# Patient Record
Sex: Female | Born: 1980 | Race: White | Hispanic: No | Marital: Single | Smoking: Never smoker
Health system: Southern US, Community
[De-identification: ages and names within clinical notes are randomized; demographics above are authoritative.]

## PROBLEM LIST (undated history)

## (undated) DIAGNOSIS — J45909 Unspecified asthma, uncomplicated: Secondary | ICD-10-CM

## (undated) DIAGNOSIS — M7540 Impingement syndrome of unspecified shoulder: Secondary | ICD-10-CM

## (undated) DIAGNOSIS — I10 Essential (primary) hypertension: Secondary | ICD-10-CM

## (undated) DIAGNOSIS — G44309 Post-traumatic headache, unspecified, not intractable: Secondary | ICD-10-CM

## (undated) DIAGNOSIS — Z87442 Personal history of urinary calculi: Secondary | ICD-10-CM

## (undated) DIAGNOSIS — G839 Paralytic syndrome, unspecified: Secondary | ICD-10-CM

## (undated) DIAGNOSIS — Z87820 Personal history of traumatic brain injury: Secondary | ICD-10-CM

## (undated) HISTORY — PX: FRACTURE SURGERY: SHX138

## (undated) HISTORY — DX: Personal history of traumatic brain injury: Z87.820

## (undated) HISTORY — PX: KIDNEY STONE SURGERY: SHX686

## (undated) HISTORY — DX: Impingement syndrome of unspecified shoulder: M75.40

## (undated) HISTORY — DX: Post-traumatic headache, unspecified, not intractable: G44.309

## (undated) HISTORY — PX: HUMERUS FRACTURE SURGERY: SHX670

## (undated) HISTORY — PX: FEMUR FRACTURE SURGERY: SHX633

## (undated) HISTORY — PX: PLANTAR FASCIA RELEASE: SHX2239

## (undated) HISTORY — DX: Unspecified asthma, uncomplicated: J45.909

## (undated) HISTORY — DX: Paralytic syndrome, unspecified: G83.9

## (undated) HISTORY — PX: SHOULDER SURGERY: SHX246

---
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
## 1998-12-11 HISTORY — PX: SUBACROMIAL DECOMPRESSION: SHX5174

## 1999-12-12 HISTORY — PX: TONSILLECTOMY: SUR1361

## 2007-12-12 DIAGNOSIS — M25819 Other specified joint disorders, unspecified shoulder: Secondary | ICD-10-CM

## 2007-12-12 DIAGNOSIS — M7540 Impingement syndrome of unspecified shoulder: Secondary | ICD-10-CM

## 2007-12-12 HISTORY — DX: Impingement syndrome of unspecified shoulder: M75.40

## 2007-12-12 HISTORY — DX: Other specified joint disorders, unspecified shoulder: M25.819

## 2008-08-03 ENCOUNTER — Ambulatory Visit: Payer: Self-pay

## 2008-08-26 ENCOUNTER — Ambulatory Visit: Payer: Self-pay | Admitting: General Practice

## 2008-09-22 ENCOUNTER — Encounter: Payer: Self-pay | Admitting: General Practice

## 2008-10-26 ENCOUNTER — Ambulatory Visit: Payer: Self-pay | Admitting: Internal Medicine

## 2009-07-30 ENCOUNTER — Ambulatory Visit: Payer: Self-pay | Admitting: Internal Medicine

## 2009-12-18 LAB — HM PAP SMEAR

## 2010-01-05 ENCOUNTER — Ambulatory Visit: Payer: Self-pay

## 2010-07-19 ENCOUNTER — Ambulatory Visit: Payer: Self-pay

## 2011-07-11 ENCOUNTER — Other Ambulatory Visit: Payer: Self-pay | Admitting: Internal Medicine

## 2011-10-02 ENCOUNTER — Other Ambulatory Visit: Payer: Self-pay | Admitting: Internal Medicine

## 2011-10-19 ENCOUNTER — Encounter: Payer: Self-pay | Admitting: Internal Medicine

## 2011-10-20 ENCOUNTER — Ambulatory Visit: Payer: Self-pay | Admitting: Internal Medicine

## 2011-10-20 ENCOUNTER — Ambulatory Visit (INDEPENDENT_AMBULATORY_CARE_PROVIDER_SITE_OTHER): Payer: 59 | Admitting: Internal Medicine

## 2011-10-20 ENCOUNTER — Encounter: Payer: Self-pay | Admitting: Internal Medicine

## 2011-10-20 VITALS — BP 122/82 | HR 111 | Temp 98.6°F | Wt 168.0 lb

## 2011-10-20 DIAGNOSIS — J40 Bronchitis, not specified as acute or chronic: Secondary | ICD-10-CM

## 2011-10-20 DIAGNOSIS — H6690 Otitis media, unspecified, unspecified ear: Secondary | ICD-10-CM

## 2011-10-20 MED ORDER — SULFAMETHOXAZOLE-TRIMETHOPRIM 800-160 MG PO TABS: 1.0000 | ORAL_TABLET | Freq: Two times a day (BID) | ORAL | Status: DC

## 2011-10-20 NOTE — Patient Instructions (Signed)
Chest xray today. Follow up next week. Call sooner if symptoms not improving or worsening.

## 2011-10-21 ENCOUNTER — Encounter: Payer: Self-pay | Admitting: Internal Medicine

## 2011-10-21 NOTE — Progress Notes (Signed)
Subjective:    Patient ID: Margaret Zuniga, female    DOB: 02-21-1981, 30 y.o.   MRN: [id number]  HPI 30 year old female presents for an acute visit complaining of a two-week history of sinus pressure, nasal drainage, non productive cough, and general malaise. She notes that she was seen at urgent care 2 weeks ago and treated with a ten-day course of Augmentin for suspected sinusitis. She reports some improvement in her symptoms with antibiotics however her symptoms have now recurred. She reports some chills but no fevers. She denies any shortness of breath. She's been using over-the-counter cough and cold medicines including Robitussin. She has minimal relief with this.  Outpatient Encounter Prescriptions as of 10/20/2011  Medication Sig Dispense Refill  . B Complex-C-Folic Acid (MULTIVITAMIN, STRESS FORMULA) tablet Take 1 tablet by mouth daily.        . citalopram (CELEXA) 20 MG tablet Take 20 mg by mouth daily.        Marland Kitchen ibuprofen (ADVIL,MOTRIN) 600 MG tablet Take 600 mg by mouth every 6 (six) hours as needed.        Marland Kitchen imipramine (TOFRANIL) 50 MG tablet Take 50 mg by mouth at bedtime.        Marland Kitchen ketorolac (TORADOL) 10 MG tablet Take 10 mg by mouth every 6 (six) hours as needed.        Clelia Schaumann Estrad 91-Day (SEASONIQUE PO) Take by mouth daily.        . Magnesium 250 MG TABS Take by mouth.        . nabumetone (RELAFEN) 500 MG tablet Take 500 mg by mouth 2 (two) times daily as needed.       . promethazine (PHENERGAN) 25 MG tablet Take 25 mg by mouth every 6 (six) hours as needed.        . psyllium (METAMUCIL) 58.6 % powder Take 1 packet by mouth 3 (three) times daily.        . QUEtiapine (SEROQUEL) 25 MG tablet Take 25 mg by mouth at bedtime.        Marland Kitchen tiZANidine (ZANAFLEX) 2 MG tablet Take 2 mg by mouth every 6 (six) hours as needed.        . topiramate (TOPAMAX) 100 MG tablet TAKE 1 TABLET BY MOUTH EVERY DAY AT BEDTIME  30 tablet  4  . atenolol (TENORMIN) 50 MG tablet Take 50 mg by mouth  daily.        . calcium carbonate (OS-CAL) 600 MG TABS Take 600 mg by mouth 2 (two) times daily with a meal.        . desogestrel-ethinyl estradiol (KARIVA,AZURETTE,MIRCETTE) 0.15-0.02/0.01 MG (21/5) tablet Take 1 tablet by mouth daily.          Review of Systems  Constitutional: Positive for chills and fatigue. Negative for fever and unexpected weight change.  HENT: Positive for ear pain, congestion, rhinorrhea, postnasal drip and sinus pressure. Negative for hearing loss, nosebleeds, sore throat, facial swelling, sneezing, mouth sores, trouble swallowing, neck pain, neck stiffness, voice change, tinnitus and ear discharge.   Eyes: Negative for pain, discharge, redness and visual disturbance.  Respiratory: Positive for cough. Negative for chest tightness, shortness of breath, wheezing and stridor.   Cardiovascular: Negative for chest pain, palpitations and leg swelling.  Musculoskeletal: Negative for myalgias and arthralgias.  Skin: Negative for color change and rash.  Neurological: Negative for dizziness, weakness, light-headedness and headaches.  Hematological: Negative for adenopathy.   BP 122/82  Pulse 111  Temp(Src) 98.6 F (  37 C) (Oral)  Wt 168 lb (76.204 kg)  SpO2 95%     Objective:   Physical Exam  Constitutional: She is oriented to person, place, and time. She appears well-developed and well-nourished. No distress.  HENT:  Head: Normocephalic and atraumatic.  Right Ear: External ear normal. A middle ear effusion is present.  Left Ear: External ear normal. Tympanic membrane is erythematous and bulging. A middle ear effusion is present.  Nose: Nose normal.  Mouth/Throat: Oropharynx is clear and moist. No oropharyngeal exudate.  Eyes: Conjunctivae are normal. Pupils are equal, round, and reactive to light. Right eye exhibits no discharge. Left eye exhibits no discharge. No scleral icterus.  Neck: Normal range of motion. Neck supple. No tracheal deviation present. No  thyromegaly present.  Cardiovascular: Normal rate, regular rhythm, normal heart sounds and intact distal pulses.  Exam reveals no gallop and no friction rub.   No murmur heard. Pulmonary/Chest: Effort normal. No respiratory distress. She has no wheezes. She has rhonchi in the right middle field. She has no rales. She exhibits no tenderness.  Musculoskeletal: Normal range of motion. She exhibits no edema and no tenderness.  Lymphadenopathy:    She has no cervical adenopathy.  Neurological: She is alert and oriented to person, place, and time. No cranial nerve deficit. She exhibits normal muscle tone. Coordination normal.  Skin: Skin is warm and dry. No rash noted. She is not diaphoretic. No erythema. No pallor.  Psychiatric: She has a normal mood and affect. Her behavior is normal. Judgment and thought content normal.          Assessment & Plan:  1. Bronchitis and right OM - Will treat with bactrim for better staph coverage.  Continue OTC Rotitussin DM and ibuprofen. Treatment options limited because of antibiotic interactions with pt meds. Pt will have CXR. Follow up next week or sooner if symptoms worsening.

## 2011-10-27 ENCOUNTER — Encounter: Payer: Self-pay | Admitting: Internal Medicine

## 2011-10-27 ENCOUNTER — Ambulatory Visit (INDEPENDENT_AMBULATORY_CARE_PROVIDER_SITE_OTHER): Payer: 59 | Admitting: Internal Medicine

## 2011-10-27 DIAGNOSIS — H6690 Otitis media, unspecified, unspecified ear: Secondary | ICD-10-CM

## 2011-10-27 DIAGNOSIS — J40 Bronchitis, not specified as acute or chronic: Secondary | ICD-10-CM

## 2011-10-27 DIAGNOSIS — G44309 Post-traumatic headache, unspecified, not intractable: Secondary | ICD-10-CM

## 2011-10-27 DIAGNOSIS — R030 Elevated blood-pressure reading, without diagnosis of hypertension: Secondary | ICD-10-CM

## 2011-10-27 MED ORDER — SULFAMETHOXAZOLE-TRIMETHOPRIM 800-160 MG PO TABS: 1.0000 | ORAL_TABLET | Freq: Two times a day (BID) | ORAL | Status: AC

## 2011-10-27 MED ORDER — GUAIFENESIN-CODEINE 100-10 MG/5ML PO SYRP: 5.0000 mL | ORAL_SOLUTION | Freq: Two times a day (BID) | ORAL | Status: DC | PRN

## 2011-10-27 MED ORDER — PREDNISONE (PAK) 10 MG PO TABS: ORAL_TABLET | ORAL | Status: AC

## 2011-10-27 NOTE — Patient Instructions (Addendum)
Use the sudafed pe 10 mg every 6 hours for ear tightness and pain,    Start the prednisone tonight and continue septra /bactrim for one more week.   Use the robitussin ac during the day for cough

## 2011-10-29 ENCOUNTER — Encounter: Payer: Self-pay | Admitting: Internal Medicine

## 2011-10-29 DIAGNOSIS — G839 Paralytic syndrome, unspecified: Secondary | ICD-10-CM | POA: Insufficient documentation

## 2011-10-29 DIAGNOSIS — G44309 Post-traumatic headache, unspecified, not intractable: Secondary | ICD-10-CM | POA: Insufficient documentation

## 2011-10-29 DIAGNOSIS — M7540 Impingement syndrome of unspecified shoulder: Secondary | ICD-10-CM | POA: Insufficient documentation

## 2011-10-29 DIAGNOSIS — I10 Essential (primary) hypertension: Secondary | ICD-10-CM | POA: Insufficient documentation

## 2011-10-29 NOTE — Assessment & Plan Note (Signed)
We reviewed her medication list and confirmed that she is not using toradol, relafen and ibuprofen concurrently.  She currently has her headaches under control but occasional needs a narcotic and is not abusing the prescription I gave her several months ago.
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
## 2011-10-29 NOTE — Assessment & Plan Note (Signed)
She has had prior diastolic elevations to 86.  Will have her suspend use of daily NSAIDs to see if this is the cause.

## 2011-11-01 ENCOUNTER — Encounter: Payer: Self-pay | Admitting: Internal Medicine

## 2011-11-09 ENCOUNTER — Other Ambulatory Visit: Payer: Self-pay | Admitting: Internal Medicine

## 2011-11-09 MED ORDER — IMIPRAMINE HCL 50 MG PO TABS: 50.0000 mg | ORAL_TABLET | Freq: Every day | ORAL | Status: DC

## 2011-11-09 MED ORDER — CITALOPRAM HYDROBROMIDE 20 MG PO TABS: 20.0000 mg | ORAL_TABLET | Freq: Every day | ORAL | Status: DC

## 2011-12-01 ENCOUNTER — Ambulatory Visit (INDEPENDENT_AMBULATORY_CARE_PROVIDER_SITE_OTHER): Payer: 59 | Admitting: Internal Medicine

## 2011-12-01 VITALS — BP 132/82 | HR 94 | Temp 98.5°F | Wt 165.0 lb

## 2011-12-01 DIAGNOSIS — H6690 Otitis media, unspecified, unspecified ear: Secondary | ICD-10-CM

## 2011-12-01 DIAGNOSIS — H6691 Otitis media, unspecified, right ear: Secondary | ICD-10-CM

## 2011-12-01 MED ORDER — ANTIPYRINE-BENZOCAINE 5.4-1.4 % OT SOLN: 3.0000 [drp] | OTIC | Status: AC | PRN

## 2011-12-01 MED ORDER — DOXYCYCLINE HYCLATE 100 MG PO CAPS: 100.0000 mg | ORAL_CAPSULE | Freq: Two times a day (BID) | ORAL | Status: AC

## 2011-12-02 ENCOUNTER — Encounter: Payer: Self-pay | Admitting: Internal Medicine

## 2011-12-02 NOTE — Progress Notes (Signed)
Subjective:    Patient ID: Margaret Zuniga, female    DOB: 09-20-1981, 30 y.o.   MRN: [id number]  HPI 30YO female presents for acute visit complaining of right ear pain x 1 week. Has h/o recurrent OM.  Denies fever or chills. Has some mild nasal congestion. No cough. No sore throat. Has been using OTC ibuprofen with no improvement.  Outpatient Encounter Prescriptions as of 12/01/2011  Medication Sig Dispense Refill  . Aspirin-Acetaminophen-Caffeine (EXCEDRIN PO) Take by mouth.        . B Complex-C-Folic Acid (MULTIVITAMIN, STRESS FORMULA) tablet Take 1 tablet by mouth daily.        . citalopram (CELEXA) 20 MG tablet Take 1 tablet (20 mg total) by mouth daily.  30 tablet  3  . co-enzyme Q-10 30 MG capsule Take 30 mg by mouth 3 (three) times daily.        Marland Kitchen guaiFENesin-codeine (ROBITUSSIN AC) 100-10 MG/5ML syrup Take 5 mLs by mouth 2 (two) times daily as needed for cough.  240 mL  0  . ibuprofen (ADVIL,MOTRIN) 600 MG tablet Take 600 mg by mouth every 6 (six) hours as needed.        Marland Kitchen imipramine (TOFRANIL) 50 MG tablet Take 1 tablet (50 mg total) by mouth at bedtime.  30 tablet  3  . ketorolac (TORADOL) 10 MG tablet Take 10 mg by mouth every 6 (six) hours as needed.        Clelia Schaumann Estrad 91-Day (SEASONIQUE PO) Take by mouth daily.        . Magnesium 250 MG TABS Take by mouth.        . nabumetone (RELAFEN) 500 MG tablet Take 500 mg by mouth 2 (two) times daily as needed.       . promethazine (PHENERGAN) 25 MG tablet Take 25 mg by mouth every 6 (six) hours as needed.        . psyllium (METAMUCIL) 58.6 % powder Take 1 packet by mouth 3 (three) times daily.        . QUEtiapine (SEROQUEL) 25 MG tablet Take 25 mg by mouth at bedtime.        Marland Kitchen tiZANidine (ZANAFLEX) 2 MG tablet Take 2 mg by mouth every 6 (six) hours as needed.        . topiramate (TOPAMAX) 100 MG tablet TAKE 1 TABLET BY MOUTH EVERY DAY AT BEDTIME  30 tablet  4  . antipyrine-benzocaine (AURALGAN) otic solution Place 3 drops into the  right ear every 2 (two) hours as needed for pain.  10 mL  0  . doxycycline (VIBRAMYCIN) 100 MG capsule Take 1 capsule (100 mg total) by mouth 2 (two) times daily.  20 capsule  0    Review of Systems  Constitutional: Negative for fever, chills and unexpected weight change.  HENT: Positive for ear pain and congestion. Negative for hearing loss, nosebleeds, sore throat, facial swelling, rhinorrhea, sneezing, mouth sores, trouble swallowing, neck pain, neck stiffness, voice change, postnasal drip, sinus pressure, tinnitus and ear discharge.   Eyes: Negative for pain, discharge, redness and visual disturbance.  Respiratory: Negative for cough, chest tightness, shortness of breath, wheezing and stridor.   Cardiovascular: Negative for chest pain, palpitations and leg swelling.  Musculoskeletal: Negative for myalgias and arthralgias.  Skin: Negative for color change and rash.  Neurological: Negative for dizziness, weakness, light-headedness and headaches.  Hematological: Negative for adenopathy.   BP 132/82  Pulse 94  Temp(Src) 98.5 F (36.9 C) (Oral)  Wt  165 lb (74.844 kg)  SpO2 98%     Objective:   Physical Exam  Constitutional: She is oriented to person, place, and time. She appears well-developed and well-nourished. No distress.  HENT:  Head: Normocephalic and atraumatic.  Right Ear: External ear normal. Tympanic membrane is erythematous and bulging. A middle ear effusion is present.  Left Ear: External ear normal. Tympanic membrane is not erythematous and not bulging. A middle ear effusion is present.  Nose: Nose normal.  Mouth/Throat: Oropharynx is clear and moist. No oropharyngeal exudate.  Eyes: Conjunctivae are normal. Pupils are equal, round, and reactive to light. Right eye exhibits no discharge. Left eye exhibits no discharge. No scleral icterus.  Neck: Normal range of motion. Neck supple. No tracheal deviation present. No thyromegaly present.  Cardiovascular: Normal rate,  regular rhythm, normal heart sounds and intact distal pulses.  Exam reveals no gallop and no friction rub.   No murmur heard. Pulmonary/Chest: Effort normal and breath sounds normal. No respiratory distress. She has no wheezes. She has no rales. She exhibits no tenderness.  Musculoskeletal: Normal range of motion. She exhibits no edema and no tenderness.  Lymphadenopathy:    She has no cervical adenopathy.  Neurological: She is alert and oriented to person, place, and time. No cranial nerve deficit. She exhibits normal muscle tone. Coordination normal.  Skin: Skin is warm and dry. No rash noted. She is not diaphoretic. No erythema. No pallor.  Psychiatric: She has a normal mood and affect. Her behavior is normal. Judgment and thought content normal.          Assessment & Plan:  1. Otitis media - Will treat with doxycycline 100mg  po bid x 10 days. Will use auralgan for pain.  Follow up if no improvement over next 48hr.

## 2011-12-07 ENCOUNTER — Encounter: Payer: Self-pay | Admitting: Internal Medicine

## 2012-02-13 ENCOUNTER — Ambulatory Visit (INDEPENDENT_AMBULATORY_CARE_PROVIDER_SITE_OTHER): Payer: 59 | Admitting: Internal Medicine

## 2012-02-13 ENCOUNTER — Encounter: Payer: Self-pay | Admitting: Internal Medicine

## 2012-02-13 VITALS — BP 132/94 | HR 125 | Temp 98.4°F | Resp 16 | Ht 63.0 in | Wt 164.2 lb

## 2012-02-13 DIAGNOSIS — G44321 Chronic post-traumatic headache, intractable: Secondary | ICD-10-CM

## 2012-02-13 DIAGNOSIS — R000 Tachycardia, unspecified: Secondary | ICD-10-CM

## 2012-02-13 DIAGNOSIS — R51 Headache: Secondary | ICD-10-CM

## 2012-02-13 DIAGNOSIS — G44329 Chronic post-traumatic headache, not intractable: Secondary | ICD-10-CM

## 2012-02-13 MED ORDER — METOPROLOL SUCCINATE ER 25 MG PO TB24: 25.0000 mg | ORAL_TABLET | Freq: Every day | ORAL | Status: DC
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
## 2012-02-13 NOTE — Patient Instructions (Signed)
Rather than increasing the imipramine,  I would like to add Toprol 25 mg daily at bedtime for your headache, which will also treat your rapid heart rate.  You may increase the dose to 2 tablets ( a total of 50 mg daily ) in one week

## 2012-02-14 ENCOUNTER — Encounter: Payer: Self-pay | Admitting: Internal Medicine

## 2012-02-14 ENCOUNTER — Encounter: Payer: Self-pay | Admitting: Neurology

## 2012-02-14 DIAGNOSIS — G44321 Chronic post-traumatic headache, intractable: Secondary | ICD-10-CM | POA: Insufficient documentation

## 2012-02-14 NOTE — Assessment & Plan Note (Signed)
She is currently taking imipramine and Topamax. She is tachycardic and has been on several prior occasions therefore we'll start low-dose Toprol and see her back in one month. I am recommending that she stop her birth control which is known to do this because of the way she feels when she has her menstrual cycle. Her headaches are aggravated by her menstrual cycle. I am referring her to [facility] for neurologic evaluation as she has not had any recent contact with neurologist there.

## 2012-03-04 ENCOUNTER — Other Ambulatory Visit: Payer: Self-pay | Admitting: Internal Medicine

## 2012-03-04 MED ORDER — TOPIRAMATE 100 MG PO TABS: 100.0000 mg | ORAL_TABLET | Freq: Every day | ORAL | Status: DC

## 2012-03-04 NOTE — Telephone Encounter (Signed)
Patient needs her topamax called into [facility] in [place].  She states they requested last week and she went by on Saturday to pick up but it wasn't there.

## 2012-03-11 ENCOUNTER — Other Ambulatory Visit: Payer: Self-pay | Admitting: Internal Medicine

## 2012-03-11 DIAGNOSIS — R51 Headache: Secondary | ICD-10-CM

## 2012-03-11 MED ORDER — IMIPRAMINE HCL 50 MG PO TABS: 50.0000 mg | ORAL_TABLET | Freq: Every day | ORAL | Status: DC

## 2012-03-11 MED ORDER — TOPIRAMATE 100 MG PO TABS: 100.0000 mg | ORAL_TABLET | Freq: Every day | ORAL | Status: DC

## 2012-03-11 MED ORDER — CITALOPRAM HYDROBROMIDE 20 MG PO TABS: 20.0000 mg | ORAL_TABLET | Freq: Every day | ORAL | Status: DC

## 2012-03-11 MED ORDER — METOPROLOL SUCCINATE ER 25 MG PO TB24: 50.0000 mg | ORAL_TABLET | Freq: Every day | ORAL | Status: DC

## 2012-04-02 ENCOUNTER — Encounter: Payer: Self-pay | Admitting: Neurology

## 2012-04-02 ENCOUNTER — Ambulatory Visit (INDEPENDENT_AMBULATORY_CARE_PROVIDER_SITE_OTHER): Payer: 59 | Admitting: Neurology

## 2012-04-02 VITALS — BP 110/80 | HR 84 | Wt 166.0 lb

## 2012-04-02 DIAGNOSIS — R51 Headache: Secondary | ICD-10-CM

## 2012-04-02 DIAGNOSIS — Z87820 Personal history of traumatic brain injury: Secondary | ICD-10-CM

## 2012-04-02 NOTE — Patient Instructions (Signed)
Your MRI is scheduled for Thursday, April 25th at 3:00pm.  Please arrive to [facility], first floor admitting by 2:45pm. [phone].  Your lumbar puncture is scheduled for Monday, April 29th at 10:00am.  Please arrive to Short Stay at [facility] by 8:30am.  Do not eat or drink anything after midnight.  You will need a driver afterward.  We will see you back on June 28th at 10:00am.
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
## 2012-04-04 ENCOUNTER — Ambulatory Visit (HOSPITAL_COMMUNITY)
Admission: RE | Admit: 2012-04-04 | Discharge: 2012-04-04 | Disposition: A | Discharge status: Home / Self Care | Payer: 59 | Source: Ambulatory Visit | Attending: Neurology | Admitting: Neurology

## 2012-04-04 ENCOUNTER — Encounter (HOSPITAL_COMMUNITY): Payer: Self-pay | Admitting: Pharmacy Technician

## 2012-04-04 DIAGNOSIS — J323 Chronic sphenoidal sinusitis: Secondary | ICD-10-CM | POA: Insufficient documentation

## 2012-04-04 DIAGNOSIS — J320 Chronic maxillary sinusitis: Secondary | ICD-10-CM | POA: Insufficient documentation

## 2012-04-04 DIAGNOSIS — Z87820 Personal history of traumatic brain injury: Secondary | ICD-10-CM | POA: Insufficient documentation

## 2012-04-04 DIAGNOSIS — R51 Headache: Secondary | ICD-10-CM | POA: Insufficient documentation

## 2012-04-05 ENCOUNTER — Other Ambulatory Visit: Payer: Self-pay | Admitting: Radiology

## 2012-04-05 ENCOUNTER — Other Ambulatory Visit: Payer: Self-pay | Admitting: Neurology

## 2012-04-05 DIAGNOSIS — R51 Headache: Secondary | ICD-10-CM

## 2012-04-08 ENCOUNTER — Ambulatory Visit (HOSPITAL_COMMUNITY)
Admission: RE | Admit: 2012-04-08 | Discharge: 2012-04-08 | Disposition: A | Discharge status: Home / Self Care | Payer: Medicare Other | Source: Ambulatory Visit | Attending: Neurology | Admitting: Neurology

## 2012-04-08 DIAGNOSIS — Z87820 Personal history of traumatic brain injury: Secondary | ICD-10-CM

## 2012-04-08 DIAGNOSIS — R51 Headache: Secondary | ICD-10-CM

## 2012-04-08 LAB — CSF CELL COUNT WITH DIFFERENTIAL
Eosinophils, CSF: 0 % (ref 0–1)
Eosinophils, CSF: 0 % (ref 0–1)
Other Cells, CSF: 0
Other Cells, CSF: 0
RBC Count, CSF: 1 /mm3 — ABNORMAL HIGH
RBC Count, CSF: 23 /mm3 — ABNORMAL HIGH
Tube #: 1
Tube #: 3
WBC, CSF: 0 /mm3 (ref 0–5)
WBC, CSF: 1 /mm3 (ref 0–5)

## 2012-04-08 LAB — PROTEIN AND GLUCOSE, CSF
Glucose, CSF: 66 mg/dL (ref 43–76)
Total  Protein, CSF: 47 mg/dL — ABNORMAL HIGH (ref 15–45)

## 2012-04-08 MED ORDER — ACETAMINOPHEN 325 MG PO TABS: 650.0000 mg | ORAL_TABLET | ORAL | Status: DC | PRN

## 2012-04-08 NOTE — Procedures (Signed)
Successful fluoro-guided LP at L3-4.  Opening pressure 11 cm H20.  12 mL clear CSF withdrawn and sent for laboratory evaluation.  No immediate complications.

## 2012-04-08 NOTE — Discharge Instructions (Signed)
Lumbar Puncture A lumbar puncture (LP) is a procedure in which a small amount of the fluid that surrounds the brain and spinal cord, is removed and examined. The fluid is called the cerebrospinal fluid, or CSF. This test is also called a spinal tap. This is a very safe and commonly used test. Your caregiver will explain the need for this in your child. This test can be lifesaving when infections are present. The most common reason for doing a lumbar puncture in infants and children is to look for an infection of the meninges. The meninges cover and help protect the brain and spinal cord. Meningitis is an infection that inflames the meninges. Lumbar punctures are also done to remove fluid and relieve pressure with certain types of headaches. Sometimes they are performed to look for bleeding in the central nervous system or to place medicine into the spinal fluid. PROCEDURE The patient is positioned so that the spaces between the vertebrae (bones of the spine) are as wide as possible. This position makes it easier to pass the needle into the spinal canal. Infants and small children lie on their sides curled up with their knees under their chin. Teens or adults may sit with their heads resting on a pillow placed on a table at waist level. The skin covering the lower or lumbar region of the back is cleaned. Sometimes the skin is numbed with medication. A small needle is then inserted until it enters the space that contains the spinal fluid. The needle does not enter the spinal cord because the test is done below the level of the spinal cord. The spinal fluid is collected into tubes. It is then sent to a laboratory where it is examined. Cultures may be taken if an infection is suspected. These studies give valuable information to help diagnose various problems including meningitis, encephalitis, bleeding within the central nervous system (brain and spinal cord areas), multiple sclerosis, AIDS, etc. Some results  are available within 30 minutes. If cultures are done to look for infection, the results are usually not available for a couple days. If your caregiver suspects infection, antibiotic treatment may be started before the results are back.  The pressure of the spinal fluid can also be measured as part of the test. After the sample is collected, the needle is withdrawn and a bandage is placed on the site. RISKS AND COMPLICATIONS One of the risks of this test is bleeding. This most often occurs in people with bleeding disorders. These are disorders in which the blood does not clot normally. Your caregiver always makes sure the benefits outweigh the risks. Paralysis following a LP would be an extremely rare complication. The most common problem following an LP is a spinal headache. This is uncomfortable but not dangerous. The spinal headache can easily be treated. This headache comes with sitting up or standing following a spinal tap. It is due to a change in pressure that is seen after fluid is withdrawn. AFTER THE PROCEDURE  Remain lying down (except for bathroom use) for one hour or as long as your caregiver suggests.   Avoid heavy lifting (over 10 pounds) for at least 12 hours after the procedure.   Drink enough fluids to keep your urine clear or pale yellow.   The above measures will help prevent a spinal headache.   Notify your caregiver immediately if you develop any numbness or tingling in your legs following the procedure or if you are unable to control your bowel or bladder.  Document Released: 11/24/2000 Document Revised: 11/16/2011 Document Reviewed: 02/17/2008 [facility] Patient Information 2012 [place], [place].

## 2012-04-09 ENCOUNTER — Telehealth (HOSPITAL_COMMUNITY): Payer: Self-pay | Admitting: *Deleted

## 2012-04-09 NOTE — Telephone Encounter (Signed)
Post procedure follow up call.  Says doing well, no problems at this time

## 2012-04-11 ENCOUNTER — Telehealth: Payer: Self-pay | Admitting: Neurology

## 2012-04-11 ENCOUNTER — Other Ambulatory Visit: Payer: Self-pay | Admitting: Neurology

## 2012-04-11 MED ORDER — TOPIRAMATE 50 MG PO TABS: ORAL_TABLET | ORAL | Status: DC

## 2012-04-11 MED ORDER — DEXAMETHASONE 2 MG PO TABS: ORAL_TABLET | ORAL | Status: DC

## 2012-04-11 NOTE — Telephone Encounter (Signed)
Called and spoke with Selena Batten. Information given as directed by Dr. Modesto Charon below. Instructed to d/c OTC medications. Aware of new prescriptions and how to take them. F/u appointment rescheduled at the patient's request. Advised to call with questions or concerns prn. The patient agreed with this plan.

## 2012-04-11 NOTE — Telephone Encounter (Signed)
Message copied by Benay Spice on Thu Apr 11, 2012 11:33 AM ------      Message from: Milas Gain      Created: Thu Apr 11, 2012  9:27 AM       Let Margaret Zuniga know that her the pressure on her LP and her MRI brain looked ok.  I would suggest increasing her Topamax to 50 in the a.m. and 100 at night - we will need to call her a new prescription for this.  Also, if she can stop any OTC meds for at least 3 weeks and at the same time I would like to give her a steroid taper which can sometimes help break the headache - Decadron 2mg  tabs take 2 tabs bid for 5 days, and then take 1 tab bid for 5 days then stop.  dispense 30 tabs no refills.

## 2012-04-15 ENCOUNTER — Telehealth: Payer: Self-pay | Admitting: Neurology

## 2012-04-15 NOTE — Telephone Encounter (Signed)
If she absolutely has to, then she can take the percocet and/or naproxen, but let her know that I am worried the percocet in particular may be making the headache worse.  Have her keep track of how many percocet she takes for headache.

## 2012-04-15 NOTE — Telephone Encounter (Signed)
Picked up a call from the patient. She wants to know if she can take her Percocet for a severe HA prn and her Naproxen for chest pain prn? We talked last Thursday and was instructed to stop all OTC medications for 3 weeks while she was taking her Decadron taper and increasing her Topamax. She c/o of a severe HA yesterday but took nothing. She currently has a HA today she rates a 7/10. She wants to be sure she can take something on as as needed basis. I told her I would check with Dr. Modesto Charon and get back with her. **Dr. Modesto Charon, please advise. Thanks.

## 2012-04-15 NOTE — Telephone Encounter (Signed)
Spoke with the patient. Information given as per Dr. Modesto Charon. The patient understands. She will keep track of the number of Percocet she takes for her severe HA.

## 2012-05-01 ENCOUNTER — Encounter: Payer: Self-pay | Admitting: Internal Medicine

## 2012-05-01 ENCOUNTER — Ambulatory Visit (INDEPENDENT_AMBULATORY_CARE_PROVIDER_SITE_OTHER): Payer: Medicare Other | Admitting: Internal Medicine

## 2012-05-01 VITALS — BP 120/82 | HR 107 | Temp 98.1°F | Resp 16 | Wt 165.8 lb

## 2012-05-01 DIAGNOSIS — H6981 Other specified disorders of Eustachian tube, right ear: Secondary | ICD-10-CM

## 2012-05-01 DIAGNOSIS — R51 Headache: Secondary | ICD-10-CM

## 2012-05-01 DIAGNOSIS — H6980 Other specified disorders of Eustachian tube, unspecified ear: Secondary | ICD-10-CM

## 2012-05-01 MED ORDER — CICLESONIDE 50 MCG/ACT NA SUSP: 2.0000 | Freq: Every day | NASAL | Status: DC

## 2012-05-01 MED ORDER — AZITHROMYCIN 500 MG PO TABS: 500.0000 mg | ORAL_TABLET | Freq: Every day | ORAL | Status: AC

## 2012-05-01 MED ORDER — OXYCODONE-ACETAMINOPHEN 5-325 MG PO TABS: 1.0000 | ORAL_TABLET | Freq: Three times a day (TID) | ORAL | Status: DC | PRN

## 2012-05-01 NOTE — Patient Instructions (Addendum)
I would like you to use Omnaris 2 squirts in each nostril  Once daily (4 squirts total  ) to help decompress your ear.  I will call in an rx for azithromycin  To take for one week and a refill on the omnaris to continue using after 2 weeks of samples  Consider takign zyretec or allegra once daily in the evening for allergies.
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
## 2012-05-05 DIAGNOSIS — H6981 Other specified disorders of Eustachian tube, right ear: Secondary | ICD-10-CM | POA: Insufficient documentation

## 2012-05-05 DIAGNOSIS — H6983 Other specified disorders of Eustachian tube, bilateral: Secondary | ICD-10-CM | POA: Insufficient documentation

## 2012-05-05 NOTE — Assessment & Plan Note (Addendum)
Trial of omnairs steroid nasal spray.  There is no immediate indication for abx but since I cannot see the TM will treat empirically with azithromycin She cannot use oral decongestants.

## 2012-05-09 ENCOUNTER — Other Ambulatory Visit: Payer: Self-pay | Admitting: Internal Medicine

## 2012-05-09 MED ORDER — LEVONORGEST-ETH ESTRAD 91-DAY 0.15-0.03 &0.01 MG PO TABS: 1.0000 | ORAL_TABLET | Freq: Every day | ORAL | Status: DC

## 2012-06-07 ENCOUNTER — Ambulatory Visit: Payer: 59 | Admitting: Neurology

## 2012-06-17 ENCOUNTER — Ambulatory Visit (INDEPENDENT_AMBULATORY_CARE_PROVIDER_SITE_OTHER): Payer: 59 | Admitting: Neurology

## 2012-06-17 ENCOUNTER — Encounter: Payer: Self-pay | Admitting: Neurology

## 2012-06-17 VITALS — BP 120/80 | HR 104 | Wt 170.0 lb

## 2012-06-17 DIAGNOSIS — G44329 Chronic post-traumatic headache, not intractable: Secondary | ICD-10-CM

## 2012-06-17 DIAGNOSIS — F32A Depression, unspecified: Secondary | ICD-10-CM

## 2012-06-17 DIAGNOSIS — F3289 Other specified depressive episodes: Secondary | ICD-10-CM

## 2012-06-17 DIAGNOSIS — G44321 Chronic post-traumatic headache, intractable: Secondary | ICD-10-CM

## 2012-06-17 DIAGNOSIS — F329 Major depressive disorder, single episode, unspecified: Secondary | ICD-10-CM

## 2012-06-17 MED ORDER — MELATONIN 3 MG PO TABS: 3.0000 mg | ORAL_TABLET | Freq: Every day | ORAL | Status: DC

## 2012-06-17 MED ORDER — ELETRIPTAN HYDROBROMIDE 40 MG PO TABS: 40.0000 mg | ORAL_TABLET | ORAL | Status: DC | PRN

## 2012-06-17 MED ORDER — LAMOTRIGINE 25 MG PO TABS: ORAL_TABLET | ORAL | Status: DC

## 2012-06-17 NOTE — Patient Instructions (Signed)
Decrease Topamax to 50 twice per day for two weeks, then decrease Topamax to 50 once per day for two weeks, then stop.  Then start the Lamictal as below:  Titration to Lamictal(generic name - lamotrigine) 100mg  twice a day using  Lamictal 25mg  tablets.   Morning Dose Evening Dose  Weeks 1-2  0 tablets  1 tablet (25mg )  Weeks 3-4 1 tablet (25mg ) 1 tablet (25mg )  Weeks 5-6 1 tablet (25mg ) 2 tablets (50mg )  Weeks 7-8 2 tablets (50mg ) 2 tablets (50mg )    Weeks 9-10 3 tablets (75mg ) 3 tablets (75mg )  Weeks 11-12 4 tablets (100mg ) 4 tablets (100mg )  After Week 12 switch to 1 100mg  tablet twice per day. Titration requires 336 25mg  tablets.
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
## 2012-06-18 ENCOUNTER — Other Ambulatory Visit: Payer: Self-pay | Admitting: Neurology

## 2012-06-18 ENCOUNTER — Telehealth: Payer: Self-pay | Admitting: Neurology

## 2012-06-18 MED ORDER — RIZATRIPTAN BENZOATE 10 MG PO TABS: ORAL_TABLET | ORAL | Status: DC

## 2012-06-18 NOTE — Telephone Encounter (Signed)
Picked up a call from the patient. She saw Dr. Modesto Charon yesterday and he prescribed Relpax for her HA. Went to the pharmacy and the cost was over $300.00. She is asking Dr. Modesto Charon to prescribe something less expensive. Also, she did pick up the Lamictal and was reading the package insert which cautioned that this medication can lower the birth control threshold. Margaret Zuniga wants to know at what dose this may occur. I told her that I would check with Dr. Modesto Charon on both of these issues and get back with her. She is ok to wait. **Dr. Modesto Charon, please advise. Thanks.

## 2012-06-18 NOTE — Telephone Encounter (Signed)
generally Lamictal does not greatly interfere with birth control, despite the package insert.  birth control does interfere with Lamictal, it reduces the Lamictal dose, but I am not worried about this.  We can try Maxalt 10mg  prn headache, 10 tabs, 3 refills.

## 2012-06-18 NOTE — Telephone Encounter (Signed)
Spoke with the patient. Information given as per Dr. Modesto Charon below. Will get the medication [facility] [place].

## 2012-07-09 ENCOUNTER — Encounter: Payer: Self-pay | Admitting: Internal Medicine

## 2012-07-09 DIAGNOSIS — G4459 Other complicated headache syndrome: Secondary | ICD-10-CM

## 2012-07-25 ENCOUNTER — Encounter: Payer: Self-pay | Admitting: Internal Medicine

## 2012-07-25 ENCOUNTER — Telehealth: Payer: Self-pay | Admitting: Internal Medicine

## 2012-07-25 ENCOUNTER — Ambulatory Visit: Payer: Self-pay | Admitting: Internal Medicine

## 2012-07-25 ENCOUNTER — Ambulatory Visit (INDEPENDENT_AMBULATORY_CARE_PROVIDER_SITE_OTHER): Payer: 59 | Admitting: Internal Medicine

## 2012-07-25 VITALS — BP 118/76 | HR 84 | Temp 98.2°F | Resp 18 | Wt 172.2 lb

## 2012-07-25 DIAGNOSIS — R51 Headache: Secondary | ICD-10-CM

## 2012-07-25 DIAGNOSIS — M12559 Traumatic arthropathy, unspecified hip: Secondary | ICD-10-CM

## 2012-07-25 DIAGNOSIS — M25819 Other specified joint disorders, unspecified shoulder: Secondary | ICD-10-CM

## 2012-07-25 DIAGNOSIS — M79609 Pain in unspecified limb: Secondary | ICD-10-CM

## 2012-07-25 DIAGNOSIS — Z87820 Personal history of traumatic brain injury: Secondary | ICD-10-CM

## 2012-07-25 DIAGNOSIS — G8911 Acute pain due to trauma: Secondary | ICD-10-CM

## 2012-07-25 DIAGNOSIS — M25519 Pain in unspecified shoulder: Secondary | ICD-10-CM

## 2012-07-25 DIAGNOSIS — M7540 Impingement syndrome of unspecified shoulder: Secondary | ICD-10-CM

## 2012-07-25 DIAGNOSIS — M79605 Pain in left leg: Secondary | ICD-10-CM

## 2012-07-25 MED ORDER — OXYCODONE-ACETAMINOPHEN 5-325 MG PO TABS: 1.0000 | ORAL_TABLET | Freq: Three times a day (TID) | ORAL | Status: DC | PRN

## 2012-07-25 NOTE — Patient Instructions (Addendum)
If your x rays are negative for fractures,  It will be important t keep moving the shoulder (gently) so it doesn't freeze up.    Pendulum, wall walking  and gradually increasing circles  Will help   I will refer you to PT if you prefer.

## 2012-07-25 NOTE — Telephone Encounter (Signed)
Her x-rays are all fine. If her shoulder pain does not improve in a few weeks and we'll need to consider getting an MRI to rule out a rotator cuff tear. I would suggest that she had either do the exercises that I gave her today at home or consider physical therapy referral.
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
## 2012-07-25 NOTE — Telephone Encounter (Signed)
Patient notified, she will let us know if her pain does not improve.

## 2012-07-27 ENCOUNTER — Encounter: Payer: Self-pay | Admitting: Internal Medicine

## 2012-07-27 DIAGNOSIS — Z87820 Personal history of traumatic brain injury: Secondary | ICD-10-CM | POA: Insufficient documentation

## 2012-07-27 DIAGNOSIS — M79605 Pain in left leg: Secondary | ICD-10-CM | POA: Insufficient documentation
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
## 2012-07-27 NOTE — Assessment & Plan Note (Signed)
She has a history of a subacromial decompression in 2000 by Dr. Ernest Pine for shoulder impingement. She currently has a bruise on the top of her shoulder and her exam is notable for pain with abduction or abduction. However range of motion is normal with passive movement. I have ordered plain films of the shoulder which were negative for fractures. I've given her several exercises to do at home to prevent frozen shoulder. I have also ordered/recommended physical therapy which she is hesitant to do at this time. She will start with the home exercises.
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
## 2012-08-01 ENCOUNTER — Encounter: Payer: Self-pay | Admitting: Internal Medicine

## 2012-08-01 ENCOUNTER — Telehealth: Payer: Self-pay | Admitting: *Deleted

## 2012-08-01 NOTE — Telephone Encounter (Signed)
Patient called to let you know that she started the lamictal on Sunday and she started to develop a rash on Tuesday night. Yesterday the rash was worse and she also stated to feel nauseated and she threw up a couple of times. She hasn't taken any today. She is asking if you think it could be the mication, and if so what other suggestions do you have?

## 2012-08-01 NOTE — Telephone Encounter (Signed)
She should stop it immediately.  I will need to think a bout alternatives and will let her know

## 2012-08-01 NOTE — Telephone Encounter (Signed)
Left message asking patient to return my call.

## 2012-08-01 NOTE — Telephone Encounter (Signed)
Patient notified, we will call her back with alternatives.

## 2012-08-15 ENCOUNTER — Telehealth: Payer: Self-pay | Admitting: Internal Medicine

## 2012-08-15 NOTE — Telephone Encounter (Signed)
This has already been addressed, see other phone note dated 08/15/2012.

## 2012-08-15 NOTE — Telephone Encounter (Signed)
Given that she has fever and headache, I think she needs more urgent evaluation. She may have rocky mountain spotted fever or other infection. I would recommend that she goes to urgent care or ED tonight.

## 2012-08-15 NOTE — Telephone Encounter (Signed)
Can you call and check in with her? Does she have persistent vomiting? Does she have rash (mentioned from 8/22) or headache?

## 2012-08-15 NOTE — Telephone Encounter (Signed)
Spoke with patient and she stated that she has a horrible headache and has all day, she vomited twice today, low grade fever, and no rash.  [facility]

## 2012-08-15 NOTE — Telephone Encounter (Signed)
Pt has left 2 message with the triage nurse and has not heard back Pt would like to be seen today if possible.   Pt has been vomitng and can't keep anything down since yesterday Pt has a very bad headache Low grade fever

## 2012-08-15 NOTE — Telephone Encounter (Signed)
Caller: Ciarra/Patient; Patient Name: Margaret Zuniga; PCP: Duncan Dull (Adults only); Best Callback Phone Number: [phone].  Pt. complains of vomiting, onset yesterday 08/14/12, with 4 episodes of vomiting  yesterday, and 1 episode today. Pt. also complains of a frontal headache; onset 08/14/12 prior to the vomiting.   Pt. has had 4 oz. of cola today, but vomited some of this back up. LMP: 08/13/12.   Pt. developed recent rash/vomiting, and was told to stop the Lamictal.  She stopped this on 08/01/12.  Triaged per Nausea or Vomiting and all emergent symptoms ruled out per guidelines.  Pt. was instructed to try a Maxalt and use as directed for her headache.  She was also given Home care instructions in regards to the vomiting.  Disposition for;  New onset of 3-4 episodes vomiting following mild abdominal cramping:  Home Care.  No appointments appeared to be available today.  Msg. Sent to Nursing Pool via EPIC.   CAN/db.

## 2012-08-15 NOTE — Telephone Encounter (Signed)
Patient notified

## 2012-09-30 ENCOUNTER — Telehealth: Payer: Self-pay | Admitting: Internal Medicine

## 2012-09-30 DIAGNOSIS — R519 Headache, unspecified: Secondary | ICD-10-CM

## 2012-09-30 NOTE — Telephone Encounter (Signed)
Referral in EPIc.  please send office visit notes.

## 2012-09-30 NOTE — Telephone Encounter (Signed)
Pt's mother stopped by and said the visit with the Neurologist at [facility] and it's not helping at all they becoming unbearable. She was wondering if a referral could be put in for the [facility] in [place] to see if that will help any.

## 2012-10-02 NOTE — Telephone Encounter (Signed)
Patient is aware that I have faxed over information to the [facility] in [place] and they will contact her to schedule.

## 2012-11-27 ENCOUNTER — Encounter: Payer: Self-pay | Admitting: Internal Medicine

## 2012-12-17 ENCOUNTER — Ambulatory Visit (INDEPENDENT_AMBULATORY_CARE_PROVIDER_SITE_OTHER): Payer: 59 | Admitting: Internal Medicine

## 2012-12-17 ENCOUNTER — Encounter: Payer: Self-pay | Admitting: Internal Medicine

## 2012-12-17 VITALS — BP 118/84 | HR 80 | Temp 98.1°F | Resp 16 | Wt 178.8 lb

## 2012-12-17 DIAGNOSIS — Z13220 Encounter for screening for lipoid disorders: Secondary | ICD-10-CM

## 2012-12-17 DIAGNOSIS — I10 Essential (primary) hypertension: Secondary | ICD-10-CM

## 2012-12-17 DIAGNOSIS — I498 Other specified cardiac arrhythmias: Secondary | ICD-10-CM

## 2012-12-17 DIAGNOSIS — R000 Tachycardia, unspecified: Secondary | ICD-10-CM | POA: Insufficient documentation

## 2012-12-17 NOTE — Assessment & Plan Note (Signed)
Accompanied by tachycardia. She has no history of palpitations or some swings in blood pressure or pulse. Nothing to suggest pheochromocytoma. She does use birth control.

## 2012-12-17 NOTE — Assessment & Plan Note (Addendum)
Cause unclear she appears to have dealt to waves waves on EKG  raising the question of Wolff-Parkinson-White syndrome however isn't currently asymptomatic and rate controlled on low-dose beta blocker. Her EKG was also notable for  left ventricular hypertrophy.  Will refer to cardiology for evaluation EKG and diagnostic echocardiogram.

## 2012-12-17 NOTE — Patient Instructions (Signed)
Return for fasting labs as soon as it is convenient.  We will rule out diabetes ,  Thyroid and cholesterol problems.    Baseline EKG to be done today.    Continue the metoprolol
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
## 2012-12-18 ENCOUNTER — Other Ambulatory Visit (INDEPENDENT_AMBULATORY_CARE_PROVIDER_SITE_OTHER): Payer: 59

## 2012-12-18 DIAGNOSIS — Z13220 Encounter for screening for lipoid disorders: Secondary | ICD-10-CM

## 2012-12-18 DIAGNOSIS — R000 Tachycardia, unspecified: Secondary | ICD-10-CM

## 2012-12-18 LAB — LIPID PANEL
Cholesterol: 174 mg/dL (ref 0–200)
HDL: 52 mg/dL (ref >39.00)
LDL Cholesterol: 95 mg/dL (ref 0–99)
Total CHOL/HDL Ratio: 3
Triglycerides: 135 mg/dL (ref 0.0–149.0)
VLDL: 27 mg/dL (ref 0.0–40.0)

## 2012-12-18 LAB — COMPREHENSIVE METABOLIC PANEL
ALT: 24 U/L (ref 0–35)
AST: 28 U/L (ref 0–37)
Albumin: 3.6 g/dL (ref 3.5–5.2)
Alkaline Phosphatase: 49 U/L (ref 39–117)
BUN: 10 mg/dL (ref 6–23)
CO2: 20 mEq/L (ref 19–32)
Calcium: 8.9 mg/dL (ref 8.4–10.5)
Chloride: 111 mEq/L (ref 96–112)
Creatinine, Ser: 0.8 mg/dL (ref 0.4–1.2)
GFR: 83.64 mL/min (ref >60.00)
Glucose, Bld: 84 mg/dL (ref 70–99)
Potassium: 3.8 mEq/L (ref 3.5–5.1)
Sodium: 138 mEq/L (ref 135–145)
Total Bilirubin: 0.8 mg/dL (ref 0.3–1.2)
Total Protein: 6.9 g/dL (ref 6.0–8.3)

## 2012-12-18 LAB — MAGNESIUM: Magnesium: 1.8 mg/dL (ref 1.5–2.5)

## 2012-12-18 LAB — TSH: TSH: 1.11 u[IU]/mL (ref 0.35–5.50)

## 2012-12-19 ENCOUNTER — Ambulatory Visit (INDEPENDENT_AMBULATORY_CARE_PROVIDER_SITE_OTHER): Payer: 59 | Admitting: Cardiovascular Disease

## 2012-12-19 ENCOUNTER — Encounter: Payer: Self-pay | Admitting: Cardiovascular Disease

## 2012-12-19 VITALS — BP 122/82 | HR 95 | Ht 62.0 in | Wt 180.5 lb

## 2012-12-19 DIAGNOSIS — R0600 Dyspnea, unspecified: Secondary | ICD-10-CM

## 2012-12-19 DIAGNOSIS — R000 Tachycardia, unspecified: Secondary | ICD-10-CM

## 2012-12-19 DIAGNOSIS — I498 Other specified cardiac arrhythmias: Secondary | ICD-10-CM

## 2012-12-19 DIAGNOSIS — R079 Chest pain, unspecified: Secondary | ICD-10-CM
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
## 2012-12-19 NOTE — Patient Instructions (Addendum)
Your physician has requested that you have an echocardiogram. Echocardiography is a painless test that uses sound waves to create images of your heart. It provides your doctor with information about the size and shape of your heart and how well your heart's chambers and valves are working. This procedure takes approximately one hour. There are no restrictions for this procedure.  Follow up as needed 

## 2012-12-19 NOTE — Assessment & Plan Note (Signed)
The patient's labs were reviewed and overall were unremarkable. Thyroid function was normal. There are multiple possible etiologies for her sinus tachycardia including chronic pain, possible autonomic dysfunction resulting in inappropriate sinus tachycardia. I agree with an echocardiogram to make sure she does not have any underlying structural heart abnormalities. There might be a tiny delta wave on  her EKG but the PR interval is not short. I don't think she has Wolff-Parkinson-White pattern. It seems that treatment with metoprolol has been helpful in controlling her sinus tachycardia.

## 2012-12-20 ENCOUNTER — Other Ambulatory Visit (INDEPENDENT_AMBULATORY_CARE_PROVIDER_SITE_OTHER): Payer: 59 | Admitting: *Deleted

## 2012-12-20 DIAGNOSIS — R000 Tachycardia, unspecified: Secondary | ICD-10-CM

## 2012-12-20 LAB — CBC WITH DIFFERENTIAL/PLATELET
Basophils Absolute: 0 10*3/uL (ref 0.0–0.1)
Basophils Relative: 0.5 % (ref 0.0–3.0)
Eosinophils Absolute: 0.1 10*3/uL (ref 0.0–0.7)
Eosinophils Relative: 1.7 % (ref 0.0–5.0)
HCT: 37.2 % (ref 36.0–46.0)
Hemoglobin: 12.6 g/dL (ref 12.0–15.0)
Lymphocytes Relative: 21.6 % (ref 12.0–46.0)
Lymphs Abs: 1.4 10*3/uL (ref 0.7–4.0)
MCHC: 33.9 g/dL (ref 30.0–36.0)
MCV: 87 fl (ref 78.0–100.0)
Monocytes Absolute: 0.4 10*3/uL (ref 0.1–1.0)
Monocytes Relative: 5.4 % (ref 3.0–12.0)
Neutro Abs: 4.6 10*3/uL (ref 1.4–7.7)
Neutrophils Relative %: 70.8 % (ref 43.0–77.0)
Platelets: 350 10*3/uL (ref 150.0–400.0)
RBC: 4.28 Mil/uL (ref 3.87–5.11)
RDW: 13.8 % (ref 11.5–14.6)
WBC: 6.5 10*3/uL (ref 4.5–10.5)

## 2013-01-07 ENCOUNTER — Other Ambulatory Visit: Payer: Self-pay

## 2013-01-07 ENCOUNTER — Other Ambulatory Visit (INDEPENDENT_AMBULATORY_CARE_PROVIDER_SITE_OTHER): Payer: 59

## 2013-01-07 DIAGNOSIS — R0600 Dyspnea, unspecified: Secondary | ICD-10-CM

## 2013-01-07 DIAGNOSIS — R0602 Shortness of breath: Secondary | ICD-10-CM

## 2013-01-07 DIAGNOSIS — R000 Tachycardia, unspecified: Secondary | ICD-10-CM

## 2013-01-25 ENCOUNTER — Other Ambulatory Visit: Payer: Self-pay

## 2013-03-16 ENCOUNTER — Other Ambulatory Visit: Payer: Self-pay | Admitting: Internal Medicine

## 2013-03-25 ENCOUNTER — Encounter: Payer: Self-pay | Admitting: Internal Medicine

## 2013-03-25 ENCOUNTER — Ambulatory Visit (INDEPENDENT_AMBULATORY_CARE_PROVIDER_SITE_OTHER): Payer: 59 | Admitting: Internal Medicine

## 2013-03-25 VITALS — BP 124/82 | HR 86 | Temp 98.6°F | Resp 16 | Wt 183.2 lb

## 2013-03-25 DIAGNOSIS — M25561 Pain in right knee: Secondary | ICD-10-CM

## 2013-03-25 DIAGNOSIS — M25569 Pain in unspecified knee: Secondary | ICD-10-CM

## 2013-03-25 MED ORDER — MELOXICAM 15 MG PO TABS: 15.0000 mg | ORAL_TABLET | Freq: Every day | ORAL | Status: DC
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
## 2013-03-25 NOTE — Patient Instructions (Addendum)
Meloxicam 15 mg daily for 2 weeks  Ice knee for 15 minutes twice daily  Knee brace to stabilize patella (wear all the time except at bedtime)    X ray to rule out fracture

## 2013-03-26 ENCOUNTER — Encounter: Payer: Self-pay | Admitting: Internal Medicine

## 2013-03-26 ENCOUNTER — Ambulatory Visit: Payer: Self-pay | Admitting: Internal Medicine

## 2013-03-26 DIAGNOSIS — G8929 Other chronic pain: Secondary | ICD-10-CM | POA: Insufficient documentation

## 2013-03-26 DIAGNOSIS — M25561 Pain in right knee: Secondary | ICD-10-CM | POA: Insufficient documentation

## 2013-03-26 NOTE — Assessment & Plan Note (Addendum)
2 week history .  No effusion or patellar instability. Plain films, brace, ice, and  meloxicam  .  Return in two weeks.

## 2013-03-30 ENCOUNTER — Telehealth: Payer: Self-pay | Admitting: Internal Medicine

## 2013-03-30 NOTE — Telephone Encounter (Signed)
Plain x rays of right knee showed no evidence of fracture , dislocation or mal alignment.  If pain persists will need referral to ortho

## 2013-03-31 ENCOUNTER — Encounter: Payer: Self-pay | Admitting: *Deleted

## 2013-03-31 NOTE — Telephone Encounter (Signed)
Patient notified by phone as requested.

## 2013-04-18 ENCOUNTER — Encounter: Payer: Self-pay | Admitting: Internal Medicine

## 2013-05-20 ENCOUNTER — Ambulatory Visit (INDEPENDENT_AMBULATORY_CARE_PROVIDER_SITE_OTHER): Payer: 59 | Admitting: Internal Medicine

## 2013-05-20 VITALS — BP 128/90 | HR 124 | Temp 98.1°F | Resp 16 | Wt 184.5 lb

## 2013-05-20 DIAGNOSIS — F331 Major depressive disorder, recurrent, moderate: Secondary | ICD-10-CM

## 2013-05-20 DIAGNOSIS — M7540 Impingement syndrome of unspecified shoulder: Secondary | ICD-10-CM

## 2013-05-20 DIAGNOSIS — M25819 Other specified joint disorders, unspecified shoulder: Secondary | ICD-10-CM

## 2013-05-20 DIAGNOSIS — M25569 Pain in unspecified knee: Secondary | ICD-10-CM

## 2013-05-20 DIAGNOSIS — I498 Other specified cardiac arrhythmias: Secondary | ICD-10-CM

## 2013-05-20 DIAGNOSIS — M25561 Pain in right knee: Secondary | ICD-10-CM

## 2013-05-20 DIAGNOSIS — R000 Tachycardia, unspecified: Secondary | ICD-10-CM

## 2013-05-20 MED ORDER — HYDROCODONE-ACETAMINOPHEN 5-325 MG PO TABS: 1.0000 | ORAL_TABLET | Freq: Four times a day (QID) | ORAL | Status: DC | PRN

## 2013-05-20 MED ORDER — ESCITALOPRAM OXALATE 10 MG PO TABS: 10.0000 mg | ORAL_TABLET | Freq: Every day | ORAL | Status: DC
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
## 2013-05-20 NOTE — Patient Instructions (Addendum)
You should take an extra dose of metoprolol today because your heart rate is elevated.   I am prescribing vicodin to use at night for knee pain until you can be seen by the orthopedist  We are starting Lexapro (generic) for your mood .  Let me know in2 weeks if you think it is helping

## 2013-05-21 ENCOUNTER — Encounter: Payer: Self-pay | Admitting: Internal Medicine

## 2013-05-21 DIAGNOSIS — F331 Major depressive disorder, recurrent, moderate: Secondary | ICD-10-CM | POA: Insufficient documentation

## 2013-05-21 DIAGNOSIS — F325 Major depressive disorder, single episode, in full remission: Secondary | ICD-10-CM | POA: Insufficient documentation

## 2013-05-21 HISTORY — DX: Major depressive disorder, recurrent, moderate: F33.1

## 2013-05-21 NOTE — Assessment & Plan Note (Signed)
Discussed resuming medication with lexapro.

## 2013-05-21 NOTE — Assessment & Plan Note (Signed)
Suspect meniscal or curciate ligament tear.  MRi deferred until Orthopedics evaluation . Refer to Dr Ernest Pine or Dr Martha Clan.

## 2013-05-21 NOTE — Assessment & Plan Note (Signed)
Resume metoprolol ?

## 2013-06-04 ENCOUNTER — Ambulatory Visit: Payer: Self-pay | Admitting: Orthopedic Surgery

## 2013-06-05 ENCOUNTER — Encounter: Payer: Self-pay | Admitting: Internal Medicine

## 2013-06-11 ENCOUNTER — Encounter: Payer: Self-pay | Admitting: Internal Medicine

## 2013-06-20 ENCOUNTER — Encounter: Payer: Self-pay | Admitting: Internal Medicine

## 2013-06-20 ENCOUNTER — Emergency Department: Payer: Self-pay | Admitting: Unknown Physician Specialty

## 2013-06-20 ENCOUNTER — Ambulatory Visit (INDEPENDENT_AMBULATORY_CARE_PROVIDER_SITE_OTHER): Payer: 59 | Admitting: Internal Medicine

## 2013-06-20 ENCOUNTER — Telehealth: Payer: Self-pay | Admitting: *Deleted

## 2013-06-20 VITALS — BP 118/84 | HR 104 | Temp 97.9°F | Resp 14 | Wt 187.0 lb

## 2013-06-20 DIAGNOSIS — R399 Unspecified symptoms and signs involving the genitourinary system: Secondary | ICD-10-CM | POA: Insufficient documentation

## 2013-06-20 DIAGNOSIS — R338 Other retention of urine: Secondary | ICD-10-CM

## 2013-06-20 LAB — COMPREHENSIVE METABOLIC PANEL
Albumin: 3.8 g/dL (ref 3.4–5.0)
Alkaline Phosphatase: 95 U/L (ref 50–136)
Anion Gap: 4 — ABNORMAL LOW (ref 7–16)
BUN: 9 mg/dL (ref 7–18)
Bilirubin,Total: 0.4 mg/dL (ref 0.2–1.0)
Calcium, Total: 9.2 mg/dL (ref 8.5–10.1)
Chloride: 106 mmol/L (ref 98–107)
Co2: 28 mmol/L (ref 21–32)
Creatinine: 0.9 mg/dL (ref 0.60–1.30)
EGFR (African American): >60
EGFR (Non-African Amer.): >60
Glucose: 84 mg/dL (ref 65–99)
Osmolality: 274 (ref 275–301)
Potassium: 4 mmol/L (ref 3.5–5.1)
SGOT(AST): 29 U/L (ref 15–37)
SGPT (ALT): 35 U/L (ref 12–78)
Sodium: 138 mmol/L (ref 136–145)
Total Protein: 7.7 g/dL (ref 6.4–8.2)

## 2013-06-20 LAB — CBC
HCT: 40.5 % (ref 35.0–47.0)
HGB: 13.3 g/dL (ref 12.0–16.0)
MCH: 28.4 pg (ref 26.0–34.0)
MCHC: 32.9 g/dL (ref 32.0–36.0)
MCV: 86 fL (ref 80–100)
Platelet: 296 10*3/uL (ref 150–440)
RBC: 4.7 10*6/uL (ref 3.80–5.20)
RDW: 13.6 % (ref 11.5–14.5)
WBC: 6 10*3/uL (ref 3.6–11.0)

## 2013-06-20 LAB — URINALYSIS, COMPLETE
Bacteria: NONE SEEN
Bilirubin,UR: NEGATIVE
Blood: NEGATIVE
Glucose,UR: NEGATIVE mg/dL (ref 0–75)
Ketone: NEGATIVE
Leukocyte Esterase: NEGATIVE
Nitrite: NEGATIVE
Ph: 6 (ref 4.5–8.0)
Protein: NEGATIVE
RBC,UR: NONE SEEN /HPF (ref 0–5)
Specific Gravity: 1.014 (ref 1.003–1.030)
Squamous Epithelial: <1
WBC UR: 2 /HPF (ref 0–5)
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
## 2013-06-20 NOTE — Patient Instructions (Addendum)
Your Pamelor is the likely cause of your acute urinary retention.  Dr Artis Flock is going to see you now to place a foley.  Stop the Pamelor immediately

## 2013-06-20 NOTE — Telephone Encounter (Signed)
Does she know how to empty the foley?    If you are comfortable removing the foley, I do not need to see her

## 2013-06-20 NOTE — Assessment & Plan Note (Signed)
Secondary to medications ,  Most likely ,  Her nortriptyline was increased to 150 mg several weeks ago . Dr Artis Flock will see her now.

## 2013-06-20 NOTE — Telephone Encounter (Signed)
Patient stated the reason Dr. Sheppard Penton office would not see patient is because she is medicaid and medicare she was sent to ED catheter  Placed and will come Tuesday at 3.15 nurse visit do you need to follow up visit scheduled for this patient please advise.

## 2013-06-20 NOTE — Telephone Encounter (Signed)
Yes patient is comfortable with emptying foley and I can remove no problem.

## 2013-06-23 ENCOUNTER — Emergency Department: Payer: Self-pay | Admitting: Emergency Medicine

## 2013-06-23 ENCOUNTER — Telehealth: Payer: Self-pay | Admitting: Internal Medicine

## 2013-06-23 LAB — COMPREHENSIVE METABOLIC PANEL
Albumin: 4 g/dL (ref 3.4–5.0)
Alkaline Phosphatase: 83 U/L (ref 50–136)
Anion Gap: 7 (ref 7–16)
BUN: 10 mg/dL (ref 7–18)
Bilirubin,Total: 0.5 mg/dL (ref 0.2–1.0)
Calcium, Total: 9.3 mg/dL (ref 8.5–10.1)
Chloride: 106 mmol/L (ref 98–107)
Co2: 26 mmol/L (ref 21–32)
Creatinine: 0.81 mg/dL (ref 0.60–1.30)
EGFR (African American): >60
EGFR (Non-African Amer.): >60
Glucose: 89 mg/dL (ref 65–99)
Osmolality: 276 (ref 275–301)
Potassium: 3.8 mmol/L (ref 3.5–5.1)
SGOT(AST): 30 U/L (ref 15–37)
SGPT (ALT): 37 U/L (ref 12–78)
Sodium: 139 mmol/L (ref 136–145)
Total Protein: 8 g/dL (ref 6.4–8.2)

## 2013-06-23 LAB — URINALYSIS, COMPLETE
Bilirubin,UR: NEGATIVE
Glucose,UR: NEGATIVE mg/dL (ref 0–75)
Nitrite: POSITIVE
Ph: 6 (ref 4.5–8.0)
Protein: 100
RBC,UR: 158 /HPF (ref 0–5)
Specific Gravity: 1.029 (ref 1.003–1.030)
Squamous Epithelial: NONE SEEN
WBC UR: 11 /HPF (ref 0–5)

## 2013-06-23 LAB — CBC
HCT: 40.8 % (ref 35.0–47.0)
HGB: 13.8 g/dL (ref 12.0–16.0)
MCH: 29.2 pg (ref 26.0–34.0)
MCHC: 33.7 g/dL (ref 32.0–36.0)
MCV: 87 fL (ref 80–100)
Platelet: 339 10*3/uL (ref 150–440)
RBC: 4.71 10*6/uL (ref 3.80–5.20)
RDW: 13.3 % (ref 11.5–14.5)
WBC: 9.2 10*3/uL (ref 3.6–11.0)

## 2013-06-23 NOTE — Telephone Encounter (Signed)
Patient following advise of Nurse and on the way to ED now FYI.

## 2013-06-23 NOTE — Telephone Encounter (Signed)
Patient Information:  Caller Name: Bonita Quin  Phone: [phone]  Patient: Margaret, Zuniga  Gender: Female  DOB: [birth year]  Age: 32 Years  PCP: Duncan Dull (Adults only)  Pregnant: No  Office Follow Up:  Does the office need to follow up with this patient?: No  Instructions For The Office: N/A  RN Note:  Severe headache rated 15/10 now with nausea.  Last emesis at 0430.  Urine in catheter.  Trembling all over. Has appointment for 06/24/13 to remove catheter.  Symptoms  Reason For Call & Symptoms: Seen 06/20/13 for acute urinary retention.  MD stopped Pamelor. Sent to ED for catherization.  Urine present in cath bag. Reports severe headache since 06/22/13 and severe vomiting (6-7 X in 24 hours).  No relief from Percocet.  Last emesis 0430.  No diarrhea.  Has not used Ondansetron today; last used 06/22/13 evening.  Reviewed Health History In EMR: Yes  Reviewed Medications In EMR: Yes  Reviewed Allergies In EMR: Yes  Reviewed Surgeries / Procedures: Yes  Date of Onset of Symptoms: 06/22/2013  Treatments Tried: Percocet, Ondansetron  Treatments Tried Worked: No OB / GYN:  LMP: 05/14/2013  Guideline(s) Used:  Vomiting  Headache  Traumatic Brain Injury Follow-Up Call  Disposition Per Guideline:   Go to ED Now (or to Office with PCP Approval)  Reason For Disposition Reached:   Severe headache, states "worst headache" of life  Advice Given:  N/A  RN Overrode Recommendation:  Go To ED  [facility]

## 2013-06-24 ENCOUNTER — Ambulatory Visit: Payer: 59

## 2013-06-25 LAB — URINE CULTURE

## 2013-07-23 ENCOUNTER — Telehealth: Payer: Self-pay | Admitting: *Deleted

## 2013-07-23 NOTE — Telephone Encounter (Signed)
Refill Request  Margaret Zuniga 0.15-0.03-0.01 mg tab  #[id number]  Take one tablet by mouth every day

## 2013-07-29 ENCOUNTER — Other Ambulatory Visit: Payer: Self-pay | Admitting: *Deleted

## 2013-07-29 MED ORDER — ESCITALOPRAM OXALATE 10 MG PO TABS: 10.0000 mg | ORAL_TABLET | Freq: Every day | ORAL | Status: DC

## 2013-07-30 ENCOUNTER — Encounter: Payer: Self-pay | Admitting: Internal Medicine

## 2013-07-30 MED ORDER — LEVONORGEST-ETH ESTRAD 91-DAY 0.15-0.03 &0.01 MG PO TABS: 1.0000 | ORAL_TABLET | Freq: Every day | ORAL | Status: DC

## 2013-07-30 NOTE — Telephone Encounter (Signed)
Refill sent.

## 2013-09-14 ENCOUNTER — Encounter: Payer: Self-pay | Admitting: Internal Medicine

## 2013-09-17 ENCOUNTER — Telehealth: Payer: Self-pay | Admitting: Internal Medicine

## 2013-09-17 NOTE — Telephone Encounter (Signed)
escitalopram (LEXAPRO) 10 MG tablet

## 2013-09-18 MED ORDER — ESCITALOPRAM OXALATE 10 MG PO TABS: 10.0000 mg | ORAL_TABLET | Freq: Every day | ORAL | Status: DC

## 2013-09-18 NOTE — Telephone Encounter (Signed)
Refill sent.

## 2013-09-18 NOTE — Telephone Encounter (Signed)
Okay to refill? 

## 2013-09-30 NOTE — Telephone Encounter (Signed)
Refill sent 8/20

## 2013-10-16 ENCOUNTER — Other Ambulatory Visit: Payer: Self-pay

## 2013-11-27 ENCOUNTER — Ambulatory Visit: Payer: 59 | Admitting: Internal Medicine

## 2013-11-27 ENCOUNTER — Ambulatory Visit (INDEPENDENT_AMBULATORY_CARE_PROVIDER_SITE_OTHER): Payer: 59 | Admitting: Internal Medicine

## 2013-11-27 ENCOUNTER — Encounter: Payer: Self-pay | Admitting: Internal Medicine

## 2013-11-27 VITALS — BP 124/90 | HR 102 | Temp 98.1°F | Wt 191.0 lb

## 2013-11-27 DIAGNOSIS — H6690 Otitis media, unspecified, unspecified ear: Secondary | ICD-10-CM

## 2013-11-27 DIAGNOSIS — H6692 Otitis media, unspecified, left ear: Secondary | ICD-10-CM

## 2013-11-27 MED ORDER — AMOXICILLIN-POT CLAVULANATE 875-125 MG PO TABS: 1.0000 | ORAL_TABLET | Freq: Two times a day (BID) | ORAL | Status: DC

## 2013-11-27 MED ORDER — ANTIPYRINE-BENZOCAINE 5.4-1.4 % OT SOLN: 3.0000 [drp] | OTIC | Status: DC | PRN

## 2013-11-27 NOTE — Assessment & Plan Note (Signed)
Symptoms and exam consistent with viral URI and secondary left otitis media. Will start Augmentin. Will add Auralgan to help with pain. Tylenol or Ibuprofen prn pain or fever. Follow up if persistent or worsening symptoms. Recheck ear in 3-4 weeks.

## 2013-11-27 NOTE — Progress Notes (Signed)
Subjective:    Patient ID: Margaret Zuniga, female    DOB: 08/22/1981, 32 y.o.   MRN: [id number]  HPI 32YO female presents for acute visit. Bilateral ear pain, congestion, sore throat, occasional dry cough x 1 week. No fever, chills. No dyspnea. No myalgia. Taking Tylenol cold with no improvement. No known sick contacts.  Outpatient Prescriptions Prior to Visit  Medication Sig Dispense Refill  . B Complex-C-Folic Acid (MULTIVITAMIN, STRESS FORMULA) tablet Take 1 tablet by mouth daily.        . baclofen (LIORESAL) 10 MG tablet Take 10 mg by mouth 3 (three) times daily.       . bisacodyl (DULCOLAX) 5 MG EC tablet Take 5 mg by mouth daily as needed. For constipation      . cetirizine (ZYRTEC) 10 MG tablet Take 10 mg by mouth daily.      Marland Kitchen escitalopram (LEXAPRO) 10 MG tablet Take 1 tablet (10 mg total) by mouth daily.  30 tablet  2  . HYDROcodone-acetaminophen (NORCO/VICODIN) 5-325 MG per tablet Take 1 tablet by mouth every 6 (six) hours as needed for pain.  30 tablet  0  . Levonorgestrel-Ethinyl Estradiol (SEASONIQUE) 0.15-0.03 &0.01 MG tablet Take 1 tablet by mouth daily.  91 tablet  11  . Melatonin 5 MG TABS Take by mouth daily.      . metoprolol succinate (TOPROL-XL) 25 MG 24 hr tablet TAKE 2 TABLETS BY MOUTH EVERY EVENING  180 tablet  3  . nortriptyline (PAMELOR) 25 MG capsule Take 150 mg by mouth at bedtime.       Marland Kitchen zonisamide (ZONEGRAN) 25 MG capsule Take 100 mg by mouth at bedtime.       . psyllium (METAMUCIL) 58.6 % powder Take 1 packet by mouth as needed.        No facility-administered medications prior to visit.    Review of Systems  Constitutional: Negative for fever, chills and unexpected weight change.  HENT: Positive for congestion, ear pain, postnasal drip, rhinorrhea and sore throat. Negative for ear discharge, facial swelling, hearing loss, mouth sores, nosebleeds, sinus pressure, sneezing, tinnitus, trouble swallowing and voice change.   Eyes: Negative for pain, discharge,  redness and visual disturbance.  Respiratory: Positive for cough. Negative for chest tightness, shortness of breath, wheezing and stridor.   Cardiovascular: Negative for chest pain, palpitations and leg swelling.  Musculoskeletal: Negative for arthralgias, myalgias, neck pain and neck stiffness.  Skin: Negative for color change and rash.  Neurological: Negative for dizziness, weakness, light-headedness and headaches.  Hematological: Negative for adenopathy.       Objective:   Physical Exam  Constitutional: She is oriented to person, place, and time. She appears well-developed and well-nourished. No distress.  HENT:  Head: Normocephalic and atraumatic.  Right Ear: External ear normal. Tympanic membrane is not erythematous and not bulging. A middle ear effusion is present.  Left Ear: External ear normal. Tympanic membrane is erythematous. Tympanic membrane is not bulging. A middle ear effusion is present.  Nose: Nose normal.  Mouth/Throat: Oropharynx is clear and moist. No oropharyngeal exudate.  Eyes: Conjunctivae are normal. Pupils are equal, round, and reactive to light. Right eye exhibits no discharge. Left eye exhibits no discharge. No scleral icterus.  Neck: Normal range of motion. Neck supple. No tracheal deviation present. No thyromegaly present.  Cardiovascular: Normal rate, regular rhythm, normal heart sounds and intact distal pulses.  Exam reveals no gallop and no friction rub.   No murmur heard. Pulmonary/Chest: Effort normal  and breath sounds normal. No accessory muscle usage. Not tachypneic. No respiratory distress. She has no decreased breath sounds. She has no wheezes. She has no rhonchi. She has no rales. She exhibits no tenderness.  Musculoskeletal: Normal range of motion. She exhibits no edema and no tenderness.  Lymphadenopathy:    She has no cervical adenopathy.  Neurological: She is alert and oriented to person, place, and time. No cranial nerve deficit. She exhibits  normal muscle tone. Coordination normal.  Skin: Skin is warm and dry. No rash noted. She is not diaphoretic. No erythema. No pallor.  Psychiatric: She has a normal mood and affect. Her behavior is normal. Judgment and thought content normal.          Assessment & Plan:

## 2013-11-27 NOTE — Patient Instructions (Signed)

## 2013-11-27 NOTE — Progress Notes (Signed)
Pre-visit discussion using our clinic review tool. No additional management support is needed unless otherwise documented below in the visit note.  

## 2013-12-01 ENCOUNTER — Ambulatory Visit: Payer: 59 | Admitting: Internal Medicine

## 2013-12-17 ENCOUNTER — Encounter: Payer: Self-pay | Admitting: *Deleted

## 2013-12-18 ENCOUNTER — Encounter: Payer: Self-pay | Admitting: Internal Medicine

## 2013-12-18 ENCOUNTER — Ambulatory Visit (INDEPENDENT_AMBULATORY_CARE_PROVIDER_SITE_OTHER): Payer: 59 | Admitting: Internal Medicine

## 2013-12-18 VITALS — BP 128/92 | HR 81 | Temp 98.1°F | Resp 14 | Ht 63.0 in | Wt 191.5 lb

## 2013-12-18 DIAGNOSIS — H6690 Otitis media, unspecified, unspecified ear: Secondary | ICD-10-CM

## 2013-12-18 DIAGNOSIS — H6692 Otitis media, unspecified, left ear: Secondary | ICD-10-CM

## 2013-12-18 MED ORDER — ESCITALOPRAM OXALATE 10 MG PO TABS: 10.0000 mg | ORAL_TABLET | Freq: Every day | ORAL | Status: DC

## 2013-12-20 ENCOUNTER — Encounter: Payer: Self-pay | Admitting: Internal Medicine
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
## 2013-12-20 NOTE — Assessment & Plan Note (Signed)
Resolved with empiric augmentin .  Exam is normal

## 2013-12-23 ENCOUNTER — Telehealth: Payer: Self-pay | Admitting: Internal Medicine

## 2013-12-23 NOTE — Telephone Encounter (Signed)
No answer when trying to reach pt; msg left on VM

## 2013-12-24 ENCOUNTER — Other Ambulatory Visit: Payer: Self-pay | Admitting: Internal Medicine

## 2013-12-24 MED ORDER — METOPROLOL SUCCINATE ER 50 MG PO TB24: ORAL_TABLET | ORAL | Status: DC

## 2013-12-24 NOTE — Telephone Encounter (Signed)
Patient concerned  her BP is up 124/94 and HR 121, 12/23/13 today BP 121/92 and HR 103 patient is currently taking metoprolol succinate (TOPROL-XL) 25 MG 24 hr tablet  Take 2 tablets in the evening. Patient asking if Metoprolol should be increased please advise.

## 2013-12-24 NOTE — Telephone Encounter (Signed)
Yes she can increase to 100 mg daily  i sent a new rx for the 50 mg tablet  2 tablets daily) to her pharamcy

## 2013-12-25 NOTE — Telephone Encounter (Signed)
Left message for patient to call office.  

## 2013-12-25 NOTE — Telephone Encounter (Signed)
Patient return ed call and was notified of medication change and voiced understanding

## 2014-04-20 ENCOUNTER — Ambulatory Visit (INDEPENDENT_AMBULATORY_CARE_PROVIDER_SITE_OTHER): Payer: 59 | Admitting: Internal Medicine

## 2014-04-20 ENCOUNTER — Encounter: Payer: Self-pay | Admitting: Internal Medicine

## 2014-04-20 ENCOUNTER — Other Ambulatory Visit (HOSPITAL_COMMUNITY)
Admission: RE | Admit: 2014-04-20 | Discharge: 2014-04-20 | Disposition: A | Discharge status: Home / Self Care | Payer: 59 | Source: Ambulatory Visit | Attending: Internal Medicine | Admitting: Internal Medicine

## 2014-04-20 VITALS — BP 116/78 | HR 88 | Temp 98.3°F | Resp 18 | Ht 63.0 in | Wt 191.0 lb

## 2014-04-20 DIAGNOSIS — Z1151 Encounter for screening for human papillomavirus (HPV): Secondary | ICD-10-CM | POA: Insufficient documentation

## 2014-04-20 DIAGNOSIS — I498 Other specified cardiac arrhythmias: Secondary | ICD-10-CM

## 2014-04-20 DIAGNOSIS — Z0000 Encounter for general adult medical examination without abnormal findings: Secondary | ICD-10-CM

## 2014-04-20 DIAGNOSIS — Z1239 Encounter for other screening for malignant neoplasm of breast: Secondary | ICD-10-CM

## 2014-04-20 DIAGNOSIS — R000 Tachycardia, unspecified: Secondary | ICD-10-CM

## 2014-04-20 DIAGNOSIS — M549 Dorsalgia, unspecified: Secondary | ICD-10-CM

## 2014-04-20 DIAGNOSIS — M25569 Pain in unspecified knee: Secondary | ICD-10-CM

## 2014-04-20 DIAGNOSIS — Z01419 Encounter for gynecological examination (general) (routine) without abnormal findings: Secondary | ICD-10-CM | POA: Insufficient documentation

## 2014-04-20 DIAGNOSIS — Z113 Encounter for screening for infections with a predominantly sexual mode of transmission: Secondary | ICD-10-CM

## 2014-04-20 DIAGNOSIS — G8929 Other chronic pain: Secondary | ICD-10-CM

## 2014-04-20 DIAGNOSIS — Z79899 Other long term (current) drug therapy: Secondary | ICD-10-CM

## 2014-04-20 DIAGNOSIS — I10 Essential (primary) hypertension: Secondary | ICD-10-CM

## 2014-04-20 DIAGNOSIS — Z124 Encounter for screening for malignant neoplasm of cervix: Secondary | ICD-10-CM

## 2014-04-20 DIAGNOSIS — R5381 Other malaise: Secondary | ICD-10-CM

## 2014-04-20 DIAGNOSIS — M24549 Contracture, unspecified hand: Secondary | ICD-10-CM

## 2014-04-20 DIAGNOSIS — Z87820 Personal history of traumatic brain injury: Secondary | ICD-10-CM

## 2014-04-20 DIAGNOSIS — M546 Pain in thoracic spine: Secondary | ICD-10-CM

## 2014-04-20 DIAGNOSIS — E559 Vitamin D deficiency, unspecified: Secondary | ICD-10-CM

## 2014-04-20 DIAGNOSIS — R5383 Other fatigue: Principal | ICD-10-CM

## 2014-04-20 NOTE — Progress Notes (Signed)
Patient ID: Margaret Zuniga, female   DOB: 1981-11-05, 33 y.o.   MRN: [id number]    Subjective:     Margaret Zuniga is a 33 y.o. female and is here for a comprehensive physical exam. The patient reports problems - multiple as follows.   1) On OCPs.  Has a menses every  3 months .  Needs screening labs for STDS,  LFTs and refills,  In a monogamous 11 yr relationship,.    2) Having some contraction issues with the right hand, SECONDARY TO REMOTE HISTORY OF HEAD TRAUMA.  Not using a splint .  Needs [facility]   3) Recurrent Sharp left sided thoracic back pain brought on by forward flexion , not with twisting,  Started several months ago, no prior lifting incident takes a few minutes   Plain films of thoracic spine  4 Left knee pain aggravated by kneeling and bending  Not with walking      History   Social History  . Marital Status: Single    Spouse Name: N/A    Number of Children: N/A  . Years of Education: N/A   Occupational History  . Not on file.   Social History Main Topics  . Smoking status: Never Smoker   . Smokeless tobacco: Never Used  . Alcohol Use: No  . Drug Use: No  . Sexual Activity: Not on file   Other Topics Concern  . Not on file   Social History Narrative  . No narrative on file   Health Maintenance  Topic Date Due  . Tetanus/tdap  12/19/1997  . Influenza Vaccine  07/11/2014  . Pap Smear  04/20/2017    The following portions of the patient's history were reviewed and updated as appropriate: allergies, current medications, past family history, past medical history, past social history, past surgical history and problem list.  Review of Systems A comprehensive review of systems was negative.   Objective:   BP 116/78  Pulse 88  Temp(Src) 98.3 F (36.8 C) (Oral)  Resp 18  Ht 5\' 3"  (1.6 m)  Wt 191 lb (86.637 kg)  BMI 33.84 kg/m2  SpO2 98%  LMP 02/08/2014 General Appearance:    Alert, cooperative, no distress, appears stated age  Head:     Normocephalic, without obvious abnormality, atraumatic  Eyes:    PERRL, conjunctiva/corneas clear, EOM's intact, fundi    benign, both eyes  Ears:    Normal TM's and external ear canals, both ears  Nose:   Nares normal, septum midline, mucosa normal, no drainage    or sinus tenderness  Throat:   Lips, mucosa, and tongue normal; teeth and gums normal  Neck:   Supple, symmetrical, trachea midline, no adenopathy;    thyroid:  no enlargement/tenderness/nodules; no carotid   bruit or JVD  Back:     Symmetric, no curvature, ROM normal, no CVA tenderness  Lungs:     Clear to auscultation bilaterally, respirations unlabored  Chest Wall:    No tenderness or deformity   Heart:    Regular rate and rhythm, S1 and S2 normal, no murmur, rub   or gallop  Breast Exam:    No tenderness, masses, or nipple abnormality  Abdomen:     Soft, non-tender, bowel sounds active all four quadrants,    no masses, no organomegaly  Genitalia:    Pelvic: cervix normal in appearance, external genitalia normal, no adnexal masses or tenderness, no cervical motion tenderness, rectovaginal septum normal, uterus normal size, shape,  and consistency and vagina normal without discharge  Extremities:   Extremities normal, atraumatic, no cyanosis or edema  Pulses:   2+ and symmetric all extremities  Skin:   Skin color, texture, turgor normal, no rashes or lesions  Lymph nodes:   Cervical, supraclavicular, and axillary nodes normal  Neurologic:   CNII-XII intact, normal strength, sensation and reflexes    throughout     Assessment and Plan:   Hypertension Well controlled on current regimen. Renal function due, no changes today.  Lab Results  Component Value Date   CREATININE 0.8 12/18/2012   Lab Results  Component Value Date   NA 138 12/18/2012   K 3.8 12/18/2012   CL 111 12/18/2012   CO2 20 12/18/2012     Sinus tachycardia Managed with metoprolol  Personal history of traumatic brain injury With chronic headaches,  dysarthria,  and right hand contractues now progressing without use of splint.  Referral to [facility] for hand splint in process  Knee pain, chronic No swelling or patellar instability on exam.  Recommended wt loss and quads strengthening.   Left-sided thoracic back pain In the absence of trauma,  Suspect OA or djd. Plain films,  Exercise for core strengthening recommended.   Encounter for preventive health examination Annual comprehensive exam was done including breast, pelvic and PAP smear. All screenings have been addressed . bseline mammogram,  Screening for hep c and hic recommended.    Updated Medication List Outpatient Encounter Prescriptions as of 04/20/2014  Medication Sig  . antipyrine-benzocaine (AURALGAN) otic solution Place 3-4 drops into both ears every 2 (two) hours as needed for ear pain.  . B Complex-C-Folic Acid (MULTIVITAMIN, STRESS FORMULA) tablet Take 1 tablet by mouth daily.    . baclofen (LIORESAL) 10 MG tablet Take 10 mg by mouth 3 (three) times daily.   . bisacodyl (DULCOLAX) 5 MG EC tablet Take 5 mg by mouth daily as needed. For constipation  . cetirizine (ZYRTEC) 10 MG tablet Take 10 mg by mouth daily.  . divalproex (DEPAKOTE ER) 250 MG 24 hr tablet Take 2 tablets by mouth at bedtime.  Marland Kitchen escitalopram (LEXAPRO) 10 MG tablet Take 1 tablet (10 mg total) by mouth daily.  Marland Kitchen HYDROcodone-acetaminophen (NORCO/VICODIN) 5-325 MG per tablet Take 1 tablet by mouth every 6 (six) hours as needed for pain.  . Levonorgestrel-Ethinyl Estradiol (SEASONIQUE) 0.15-0.03 &0.01 MG tablet Take 1 tablet by mouth daily.  . Melatonin 5 MG TABS Take by mouth daily.  . metoprolol succinate (TOPROL-XL) 50 MG 24 hr tablet TAKE 2 TABLETS BY MOUTH EVERY EVENING  . nortriptyline (PAMELOR) 25 MG capsule Take 150 mg by mouth at bedtime.

## 2014-04-20 NOTE — Patient Instructions (Addendum)
You had your annual  wellness exam today.  We will repeat your PAP smear in 2018, sooner if needed   We will schedule your mammogram  At [facility]   Referral to [facility] orthopedic in process for your hand splint  Plain films of thoracic spine have been ordered    We will contact you with the bloodwork results  I advise you to lose 20 lbs over the next 6 months to help your knee pain .  You would also benefit from hamstring and quad strengthening exercises  This is my version of a  "Low GI"  Diet:  It will allow you to lose 4 to 8  lbs  per month if you follow it carefully.  Your goal with exercise is a minimum of 30 minutes of aerobic exercise 5 days per week (Walking does not count once it becomes easy!)    All of the foods can be found at grocery stores and in bulk at [employer].  The Atkins protein bars and shakes are available in more varieties at Target, [employer] and [place].     7 AM Breakfast:  Choose from the following:  Low carbohydrate Protein  Shakes (I recommend the EAS AdvantEdge "Carb Control" shakes  Or the low carb shakes by Atkins.    2.5 carbs   Arnold's "Sandwhich Thin"toasted  w/ peanut butter (no jelly: about 20 net carbs  "Bagel Thin" with cream cheese and salmon: about 20 carbs   a scrambled egg/bacon/cheese burrito made with Mission's "carb balance" whole wheat tortilla  (about 10 net carbs )   Avoid cereal and bananas, oatmeal and cream of wheat and grits. They are loaded with carbohydrates!   10 AM: high protein snack  Protein bar by Atkins (the snack size, under 200 cal, usually < 6 net carbs).    A stick of cheese:  Around 1 carb,  100 cal     Dannon Light n Fit [place] Yogurt  (80 cal, 8 carbs)  Other so called "protein bars" and Greek yogurts tend to be loaded with carbohydrates.  Remember, in food advertising, the word "energy" is synonymous for " carbohydrate."  Lunch:   A Sandwich using the bread choices listed, Can use any  Eggs,  lunchmeat, grilled  meat or canned tuna), avocado, regular mayo/mustard  and cheese.  A Salad using blue cheese, ranch,  Goddess or vinagrette,  No croutons or "confetti" and no "candied nuts" but regular nuts OK.   No pretzels or chips.  Pickles and miniature sweet peppers are a good low carb alternative that provide a "crunch"  The bread is the only source of carbohydrate in a sandwich and  can be decreased by trying some of these alternatives to traditional loaf bread  Joseph's makes a pita bread and a flat bread that are 50 cal and 4 net carbs available at [place] and [place].  This can be toasted to use with hummous as well  Toufayan makes a low carb flatbread that's 100 cal and 9 net carbs available at [employer] and [employer]'s makes 2 sizes of  Low carb whole wheat tortilla  (The large one is 210 cal and 6 net carbs) Avoid "Low fat dressings, as well as Barry Brunner and [place] dressings They are loaded with sugar!   3 PM/ Mid day  Snack:  Consider  1 ounce of  almonds, walnuts, pistachios, pecans, peanuts,  Macadamia nuts or a nut medley.  Avoid "granola"; the dried  cranberries and raisins are loaded with carbohydrates. Mixed nuts as long as there are no raisins,  cranberries or dried fruit.    Try the prosciutto/mozzarella cheese sticks by Fiorruci  In deli /backery section   High protein      6 PM  Dinner:     Meat/fowl/fish with a green salad, and either broccoli, cauliflower, green beans, spinach, brussel sprouts or  Lima beans. DO NOT BREAD THE PROTEIN!!      There is a low carb pasta by Dreamfield's that is acceptable and tastes great: only 5 digestible carbs/serving.( All grocery stores but BJs carry it )  Try Hurley Cisco Angelo's chicken piccata or chicken or eggplant parm over low carb pasta.(Lowes and BJs)   Marjory Lies Sanchez's "Carnitas" (pulled pork, no sauce,  0 carbs) or his beef pot roast to make a dinner burrito (at [employer]J's)  Pesto over low carb pasta (bj's sells a good quality pesto in the center  refrigerated section of the deli   Try satueeing  Cheral Marker with mushroooms  Whole wheat pasta is still full of digestible carbs and  Not as low in glycemic index as Dreamfield's.   Brown rice is still rice,  So skip the rice and noodles if you eat [place] or [place] (or at least limit to 1/2 cup)  9 PM snack :   Breyer's "low carb" fudgsicle or  ice cream bar (Carb Smart line), or  Weight Watcher's ice cream bar , or another "no sugar added" ice cream;  a serving of fresh berries/cherries with whipped cream   Cheese or DANNON'S LlGHT N FIT GREEK YOGURT  8 ounces of [place] unsweetened almond/cococunut milk    Avoid bananas, pineapple, grapes  and watermelon on a regular basis because they are high in sugar.  THINK OF THEM AS DESSERT  Remember that snack Substitutions should be less than 10 NET carbs per serving and meals < 20 carbs. Remember to subtract fiber grams to get the "net carbs."

## 2014-04-21 DIAGNOSIS — M546 Pain in thoracic spine: Secondary | ICD-10-CM | POA: Insufficient documentation

## 2014-04-21 DIAGNOSIS — Z0000 Encounter for general adult medical examination without abnormal findings: Secondary | ICD-10-CM | POA: Insufficient documentation

## 2014-04-21 LAB — CBC WITH DIFFERENTIAL/PLATELET
Basophils Absolute: 0 10*3/uL (ref 0.0–0.1)
Basophils Relative: 0.1 % (ref 0.0–3.0)
Eosinophils Absolute: 0.1 10*3/uL (ref 0.0–0.7)
Eosinophils Relative: 1.9 % (ref 0.0–5.0)
HCT: 39.9 % (ref 36.0–46.0)
Hemoglobin: 13.3 g/dL (ref 12.0–15.0)
Lymphocytes Relative: 23.1 % (ref 12.0–46.0)
Lymphs Abs: 1.6 10*3/uL (ref 0.7–4.0)
MCHC: 33.3 g/dL (ref 30.0–36.0)
MCV: 89.8 fl (ref 78.0–100.0)
Monocytes Absolute: 0.3 10*3/uL (ref 0.1–1.0)
Monocytes Relative: 4.9 % (ref 3.0–12.0)
Neutro Abs: 4.7 10*3/uL (ref 1.4–7.7)
Neutrophils Relative %: 70 % (ref 43.0–77.0)
Platelets: 294 10*3/uL (ref 150.0–400.0)
RBC: 4.44 Mil/uL (ref 3.87–5.11)
RDW: 13.5 % (ref 11.5–15.5)
WBC: 6.7 10*3/uL (ref 4.0–10.5)

## 2014-04-21 LAB — COMPREHENSIVE METABOLIC PANEL
ALT: 29 U/L (ref 0–35)
AST: 31 U/L (ref 0–37)
Albumin: 3.7 g/dL (ref 3.5–5.2)
Alkaline Phosphatase: 47 U/L (ref 39–117)
BUN: 10 mg/dL (ref 6–23)
CO2: 24 mEq/L (ref 19–32)
Calcium: 9.4 mg/dL (ref 8.4–10.5)
Chloride: 108 mEq/L (ref 96–112)
Creatinine, Ser: 0.8 mg/dL (ref 0.4–1.2)
GFR: 91.87 mL/min (ref >60.00)
Glucose, Bld: 96 mg/dL (ref 70–99)
Potassium: 4.3 mEq/L (ref 3.5–5.1)
Sodium: 139 mEq/L (ref 135–145)
Total Bilirubin: 0.7 mg/dL (ref 0.2–1.2)
Total Protein: 6.6 g/dL (ref 6.0–8.3)

## 2014-04-21 LAB — HIV ANTIBODY (ROUTINE TESTING W REFLEX): HIV 1&2 Ab, 4th Generation: NONREACTIVE

## 2014-04-21 LAB — TSH: TSH: 1.39 u[IU]/mL (ref 0.35–4.50)

## 2014-04-21 LAB — VITAMIN D 25 HYDROXY (VIT D DEFICIENCY, FRACTURES): Vit D, 25-Hydroxy: 62 ng/mL (ref 30–89)

## 2014-04-21 LAB — HEPATITIS C ANTIBODY: HCV Ab: NEGATIVE

## 2014-04-21 NOTE — Assessment & Plan Note (Signed)
Managed with metoprolol 

## 2014-04-21 NOTE — Assessment & Plan Note (Signed)
Well controlled on current regimen. Renal function due, no changes today.  Lab Results  Component Value Date   CREATININE 0.8 12/18/2012   Lab Results  Component Value Date   NA 138 12/18/2012   K 3.8 12/18/2012   CL 111 12/18/2012   CO2 20 12/18/2012

## 2014-04-21 NOTE — Assessment & Plan Note (Signed)
In the absence of trauma,  Suspect OA or djd. Plain films,  Exercise for core strengthening recommended.

## 2014-04-21 NOTE — Assessment & Plan Note (Signed)
No swelling or patellar instability on exam.  Recommended wt loss and quads strengthening.

## 2014-04-21 NOTE — Assessment & Plan Note (Signed)
Annual comprehensive exam was done including breast, pelvic and PAP smear. All screenings have been addressed . bseline mammogram,  Screening for hep c and hic recommended.

## 2014-04-21 NOTE — Assessment & Plan Note (Signed)
With chronic headaches, dysarthria,  and right hand contractues now progressing without use of splint.  Referral to [place] for hand splint in process

## 2014-04-22 ENCOUNTER — Encounter: Payer: Self-pay | Admitting: Internal Medicine

## 2014-04-23 ENCOUNTER — Encounter: Payer: Self-pay | Admitting: Internal Medicine

## 2014-04-28 NOTE — Telephone Encounter (Signed)
Mailed unread message to pt  

## 2014-05-05 ENCOUNTER — Encounter: Payer: Self-pay | Admitting: Internal Medicine

## 2014-05-20 DIAGNOSIS — M20039 Swan-neck deformity of unspecified finger(s): Secondary | ICD-10-CM | POA: Insufficient documentation

## 2014-05-20 DIAGNOSIS — M24539 Contracture, unspecified wrist: Secondary | ICD-10-CM | POA: Insufficient documentation

## 2014-05-28 ENCOUNTER — Ambulatory Visit: Payer: Self-pay | Admitting: Internal Medicine

## 2014-06-05 ENCOUNTER — Encounter: Payer: Self-pay | Admitting: Internal Medicine

## 2014-08-05 ENCOUNTER — Other Ambulatory Visit: Payer: Self-pay | Admitting: Internal Medicine

## 2014-11-30 ENCOUNTER — Encounter: Payer: Self-pay | Admitting: Internal Medicine

## 2014-11-30 ENCOUNTER — Telehealth: Payer: Self-pay | Admitting: Internal Medicine

## 2014-11-30 ENCOUNTER — Ambulatory Visit (INDEPENDENT_AMBULATORY_CARE_PROVIDER_SITE_OTHER): Payer: 59 | Admitting: Internal Medicine

## 2014-11-30 VITALS — BP 127/85 | HR 82 | Temp 98.5°F | Ht 63.0 in | Wt 211.5 lb

## 2014-11-30 DIAGNOSIS — J069 Acute upper respiratory infection, unspecified: Secondary | ICD-10-CM

## 2014-11-30 DIAGNOSIS — J020 Streptococcal pharyngitis: Secondary | ICD-10-CM

## 2014-11-30 DIAGNOSIS — B9789 Other viral agents as the cause of diseases classified elsewhere: Secondary | ICD-10-CM

## 2014-11-30 LAB — POCT RAPID STREP A (OFFICE): Rapid Strep A Screen: NEGATIVE

## 2014-11-30 MED ORDER — PREDNISONE (PAK) 10 MG PO TABS: ORAL_TABLET | ORAL | Status: DC

## 2014-11-30 MED ORDER — AMOXICILLIN-POT CLAVULANATE 875-125 MG PO TABS: 1.0000 | ORAL_TABLET | Freq: Two times a day (BID) | ORAL | Status: DC

## 2014-11-30 NOTE — Patient Instructions (Signed)
Your strep tests are negative  You have a viral syndrome which is causing bronchitis.    The post nasal drip is causing your sore throat and   Cough.  I am prescribing a prednisone taper to manage the inflammation in your bronchial tubes   I also advise use of the following OTC meds to help with your other symptoms.   Take generic OTC benadryl 25 mg  At bedtime for the drainage,,  Delsym for daytime cough, sudafed PE 10 mg every 6 hours  for sinus congestion or ear fullness flush your sinuses twice daily with Simply Saline   If the coughing has not impoved in  48 hours , start the predisone.   if you develop  A temperature > 100.4,  Green nasal discharge,  Or facial pain, start the antibiotic (augmentin)   Please take a probiotic ( Align, [place]ora[place] or Culturelle) while you are on the antibiotic to prevent a serious antibiotic associated diarrhea  Called clostridium dificile colitis and a vaginal yeast infection

## 2014-11-30 NOTE — Telephone Encounter (Signed)
Margaret Zuniga called saying she feels very bad with a sore throat, congestion, and ear pain. This has been going on for a couple of days now. She's wondering if she can be seen. Please call the pt. Pt ph# [phone] Thank you.

## 2014-11-30 NOTE — Progress Notes (Signed)
Pre visit review using our clinic review tool, if applicable. No additional management support is needed unless otherwise documented below in the visit note. 

## 2014-12-03 DIAGNOSIS — J069 Acute upper respiratory infection, unspecified: Secondary | ICD-10-CM | POA: Insufficient documentation

## 2014-12-03 NOTE — Progress Notes (Signed)
Patient ID: Margaret Zuniga, female   DOB: 05-30-1981, 33 y.o.   MRN: [id number]   Patient Active Problem List   Diagnosis Date Noted  . Viral URI with cough 12/03/2014  . Left-sided thoracic back pain 04/21/2014  . Encounter for preventive health examination 04/21/2014  . Major depressive disorder, recurrent episode, moderate 05/21/2013  . Knee pain, chronic 03/26/2013  . Sinus tachycardia 12/17/2012  . Personal history of traumatic brain injury   . 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 Hypertension 10/29/2011  . Shoulder impingement   . Paralysis     Subjective:  CC:   Chief Complaint  Patient presents with  . Sore Throat    HPI:   Margaret Zuniga is a 33 y.o. female who presents for evaluation of  Scratchy throat started 4 days ago   Accompanied by dry cough.  For the last days having sinus pressure accompanied by sneezing and rhinorrhea with clear to yellow discharge.  Ears have been "popping" but not painful, no loss of hearing or discharge,  No fevers, nausea.  No recent travel.     Past Medical History  Diagnosis Date  . 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 Shoulder impingement 2009    surgical relesase, Dr. Marry Guan  . Screening for cervical cancer May 2012    , reportedly normal  . 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 Personal history of traumatic brain injury 12    Past Surgical History  Procedure Laterality Date  . Leg surgery  1985  . Tonsillectomy  2001  . Eye surgery  1995  . Subacromial decompression  2000    Right shoulder, [place]       The following portions of the patient's history were reviewed and updated as appropriate: Allergies, current medications, and problem list.    Review of Systems:   Patient denies headache, fevers, malaise, unintentional weight loss, skin rash, eye pain, sinus congestion and sinus  pain, sore throat, dysphagia,  hemoptysis , cough, dyspnea, wheezing, chest pain, palpitations, orthopnea, edema, abdominal pain, nausea, melena, diarrhea, constipation, flank pain, dysuria, hematuria, urinary  Frequency, nocturia, numbness, tingling, seizures,  Focal weakness, Loss of consciousness,  Tremor, insomnia, depression, anxiety, and suicidal ideation.     History   Social History  . Marital Status: Single    Spouse Name: N/A    Number of Children: N/A  . Years of Education: N/A   Occupational History  . Not on file.   Social History Main Topics  . Smoking status: Never Smoker   . Smokeless tobacco: Never Used  . Alcohol Use: No  . Drug Use: No  . Sexual Activity: Not on file   Other Topics Concern  . Not on file   Social History Narrative    Objective:  Filed Vitals:   11/30/14 1811  BP: 127/85  Pulse: 82  Temp: 98.5 F (36.9 C)     General appearance: alert, cooperative and appears stated age Ears: normal TM's and external ear canals both ears Throat: lips, mucosa, and tongue normal; teeth and gums normal Neck: no adenopathy, no carotid bruit, supple, symmetrical, trachea midline and thyroid not enlarged, symmetric, no tenderness/mass/nodules Back: symmetric, no curvature. ROM normal. No CVA tenderness. Lungs: clear to auscultation bilaterally Heart: regular rate and rhythm, S1, S2 normal, no murmur, click, rub or gallop  Abdomen: soft, non-tender; bowel sounds normal; no masses,  no organomegaly Pulses: 2+ and symmetric Skin: Skin color, texture, turgor normal. No rashes or lesions Lymph nodes: Cervical, supraclavicular, and axillary nodes normal.  Assessment and Plan:  Viral URI with cough This URI is most likely viral given the mild HEENT  symptoms  I have explained that in viral URIS, an antibiotic will not help the symptoms and will increase the risk of developing diarrhea.,  Continue oral and nasal decongestants,  Ibuprofen 400 mg and tylenol 650  mq 8 hrs for aches and pains,  And will add  abx only if symptoms worsen to include fevers, facial pain, purulent sputum./drainage.    Updated Medication List Outpatient Encounter Prescriptions as of 11/30/2014  Medication Sig  . atenolol (TENORMIN) 25 MG tablet Take 25 mg by mouth daily.  . B Complex-C-Folic Acid (MULTIVITAMIN, STRESS FORMULA) tablet Take 1 tablet by mouth daily.    . baclofen (LIORESAL) 10 MG tablet Take 10 mg by mouth 3 (three) times daily.   . bisacodyl (DULCOLAX) 5 MG EC tablet Take 5 mg by mouth daily as needed. For constipation  . CAMRESE 0.15-0.03 &0.01 MG tablet TAKE 1 TABLET BY MOUTH DAILY.  . cetirizine (ZYRTEC) 10 MG tablet Take 10 mg by mouth daily.  Marland Kitchen escitalopram (LEXAPRO) 10 MG tablet Take 1 tablet (10 mg total) by mouth daily.  . Melatonin 5 MG TABS Take by mouth daily.  . [DISCONTINUED] antipyrine-benzocaine (AURALGAN) otic solution Place 3-4 drops into both ears every 2 (two) hours as needed for ear pain.  . [DISCONTINUED] divalproex (DEPAKOTE ER) 250 MG 24 hr tablet Take 2 tablets by mouth at bedtime.  . [DISCONTINUED] HYDROcodone-acetaminophen (NORCO/VICODIN) 5-325 MG per tablet Take 1 tablet by mouth every 6 (six) hours as needed for pain.  . [DISCONTINUED] metoprolol succinate (TOPROL-XL) 50 MG 24 hr tablet TAKE 2 TABLETS BY MOUTH EVERY EVENING  . [DISCONTINUED] nortriptyline (PAMELOR) 25 MG capsule Take 150 mg by mouth at bedtime.   Marland Kitchen amoxicillin-clavulanate (AUGMENTIN) 875-125 MG per tablet Take 1 tablet by mouth 2 (two) times daily.  . predniSONE (STERAPRED UNI-PAK) 10 MG tablet 6 tablets on Day 1 , then reduce by 1 tablet daily until gone     Orders Placed This Encounter  Procedures  . POCT rapid strep A    No Follow-up on file.

## 2014-12-03 NOTE — Assessment & Plan Note (Signed)
This URI is most likely viral given the mild HEENT  symptoms  I have explained that in viral URIS, an antibiotic will not help the symptoms and will increase the risk of developing diarrhea.,  Continue oral and nasal decongestants,  Ibuprofen 400 mg and tylenol 650 mq 8 hrs for aches and pains,  And will add  abx only if symptoms worsen to include fevers, facial pain, purulent sputum./drainage.

## 2014-12-07 ENCOUNTER — Other Ambulatory Visit: Payer: Self-pay | Admitting: *Deleted

## 2014-12-07 MED ORDER — ESCITALOPRAM OXALATE 10 MG PO TABS: 10.0000 mg | ORAL_TABLET | Freq: Every day | ORAL | Status: DC

## 2015-02-15 ENCOUNTER — Telehealth: Payer: Self-pay | Admitting: Internal Medicine

## 2015-02-15 NOTE — Telephone Encounter (Signed)
The patient's father dropped off a disability parking form to be filled out. It has been placed in the physician's box.

## 2015-02-15 NOTE — Telephone Encounter (Signed)
Placed in red folder  

## 2015-02-16 NOTE — Telephone Encounter (Signed)
Done, will be returned in red folder,  Same for linda

## 2015-02-17 NOTE — Telephone Encounter (Signed)
Patient's notifed and placed at front for pick up.

## 2015-02-19 ENCOUNTER — Encounter: Payer: Self-pay | Admitting: Internal Medicine

## 2015-02-19 ENCOUNTER — Ambulatory Visit (INDEPENDENT_AMBULATORY_CARE_PROVIDER_SITE_OTHER): Payer: Medicare Other | Admitting: Internal Medicine

## 2015-02-19 ENCOUNTER — Ambulatory Visit: Payer: Self-pay | Admitting: Internal Medicine

## 2015-02-19 VITALS — BP 118/86 | HR 85 | Temp 97.9°F | Resp 14 | Ht 63.0 in | Wt 210.0 lb

## 2015-02-19 DIAGNOSIS — R300 Dysuria: Secondary | ICD-10-CM | POA: Diagnosis not present

## 2015-02-19 DIAGNOSIS — M546 Pain in thoracic spine: Secondary | ICD-10-CM

## 2015-02-19 DIAGNOSIS — R0781 Pleurodynia: Secondary | ICD-10-CM | POA: Diagnosis not present

## 2015-02-19 MED ORDER — HYDROCODONE-ACETAMINOPHEN 10-325 MG PO TABS: 1.0000 | ORAL_TABLET | Freq: Three times a day (TID) | ORAL | Status: DC | PRN

## 2015-02-19 NOTE — Progress Notes (Signed)
Patient ID: Margaret Zuniga, female   DOB: 1981/03/09, 34 y.o.   MRN: [id number]  Patient Active Problem List   Diagnosis Date Noted  . Viral URI with cough 12/03/2014  . Bilateral thoracic back pain 04/21/2014  . Encounter for preventive health examination 04/21/2014  . Major depressive disorder, recurrent episode, moderate 05/21/2013  . Knee pain, chronic 03/26/2013  . Sinus tachycardia 12/17/2012  . Personal history of traumatic brain injury   . 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 Hypertension 10/29/2011  . Shoulder impingement   . Paralysis     Subjective:  CC:   Chief Complaint  Patient presents with  . Acute Visit    Pain radiates from back bilateral to front flank pain  . Abdominal Pain    HPI:   Margaret Zuniga is a 34 y.o. female who presents for Bilateral thoracic side pain,  Sore to the touch.  Started last saturday on the left,  Was so severe it doubled over in pain,  Took   midol and it help a little bit not completely.  Sunday the pain started on the Right.  Monday she started in the  Lower suprapubic pain but associated witn menstruation which started last Thursday and ended on Tuesday,  Normal period.  Pain is now constant in both sides  8/10 both sides.  Bowels moving regularly .  No unusual physical activity,  Sedentary ,  Headaches prevent her from doing much .   No fevers,  Nausea,  No change in bowel habits,  Every other day no straining. .  Every position hurts,  Hurts to take a deep breath which started yesterday .  No coughing or shortness of breath    Past Medical History  Diagnosis Date  . 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 Shoulder impingement 2009    surgical relesase, Dr. Marry Guan  . Screening for cervical cancer May 2012    , reportedly normal  . 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 Personal history of traumatic  brain injury 53    Past Surgical History  Procedure Laterality Date  . Leg surgery  1985  . Tonsillectomy  2001  . Eye surgery  1995  . Subacromial decompression  2000    Right shoulder, [place]       The following portions of the patient's history were reviewed and updated as appropriate: Allergies, current medications, and problem list.    Review of Systems:   Patient denies headache, fevers, malaise, unintentional weight loss, skin rash, eye pain, sinus congestion and sinus pain, sore throat, dysphagia,  hemoptysis , cough, dyspnea, wheezing, chest pain, palpitations, orthopnea, edema, abdominal pain, nausea, melena, diarrhea, constipation, flank pain, dysuria, hematuria, urinary  Frequency, nocturia, numbness, tingling, seizures,  Focal weakness, Loss of consciousness,  Tremor, insomnia, depression, anxiety, and suicidal ideation.     History   Social History  . Marital Status: Single    Spouse Name: N/A  . Number of Children: N/A  . Years of Education: N/A   Occupational History  . Not on file.   Social History Main Topics  . Smoking status: Never Smoker   . Smokeless tobacco: Never Used  . Alcohol Use: No  . Drug Use: No  . Sexual Activity: Not on file   Other Topics Concern  . Not on file  Social History Narrative    Objective:  Filed Vitals:   02/19/15 1434  BP: 118/86  Pulse: 85  Temp: 97.9 F (36.6 C)  Resp: 14     General appearance: alert, cooperative and appears stated age Ears: normal TM's and external ear canals both ears Throat: lips, mucosa, and tongue normal; teeth and gums normal Neck: no adenopathy, no carotid bruit, supple, symmetrical, trachea midline and thyroid not enlarged, symmetric, no tenderness/mass/nodules Back: symmetric, no curvature. ROM normal. Bilateral  CVA tenderness.  Lungs: clear to auscultation bilaterally Heart: regular rate and rhythm, S1, S2 normal, no murmur, click, rub or gallop Abdomen: soft,  non-tender; bowel sounds normal; no masses,  no organomegaly Pulses: 2+ and symmetric Skin: Skin color, texture, turgor normal. No rashes or lesions Lymph nodes: Cervical, supraclavicular, and axillary nodes normal.  Assessment and Plan:  Bilateral thoracic back pain Patient was unable to provide a urine specimen but denies hematuria and dysuria.  She was sent for chest CT/angio to rule out PE given her morbid obesity, sedentary lifestyle  and use of oral contraceptives and report of pleuritic pain .  CT was negative.   Recommend treat as muscle pain due to body habitus and prolonged slouching on couch.      Updated Medication List Outpatient Encounter Prescriptions as of 02/19/2015  Medication Sig  . atenolol (TENORMIN) 25 MG tablet Take 25 mg by mouth daily.  . B Complex-C-Folic Acid (MULTIVITAMIN, STRESS FORMULA) tablet Take 1 tablet by mouth daily.    . baclofen (LIORESAL) 10 MG tablet Take 10 mg by mouth 3 (three) times daily.   . bisacodyl (DULCOLAX) 5 MG EC tablet Take 5 mg by mouth daily as needed. For constipation  . CAMRESE 0.15-0.03 &0.01 MG tablet TAKE 1 TABLET BY MOUTH DAILY.  . cetirizine (ZYRTEC) 10 MG tablet Take 10 mg by mouth daily.  Marland Kitchen escitalopram (LEXAPRO) 10 MG tablet Take 1 tablet (10 mg total) by mouth daily.  . Melatonin 5 MG TABS Take by mouth daily.  . predniSONE (STERAPRED UNI-PAK) 10 MG tablet 6 tablets on Day 1 , then reduce by 1 tablet daily until gone  . amoxicillin-clavulanate (AUGMENTIN) 875-125 MG per tablet Take 1 tablet by mouth 2 (two) times daily. (Patient not taking: Reported on 02/19/2015)  . HYDROcodone-acetaminophen (NORCO) 10-325 MG per tablet Take 1 tablet by mouth every 8 (eight) hours as needed.     Orders Placed This Encounter  Procedures  . Urine Culture  . CT Angio Chest W/Cm &/Or Wo Cm  . Urinalysis, Routine w reflex microscopic  . POCT Urinalysis Dipstick    No Follow-up on file.

## 2015-02-19 NOTE — Progress Notes (Signed)
Pre-visit discussion using our clinic review tool. No additional management support is needed unless otherwise documented below in the visit note.  

## 2015-02-19 NOTE — Patient Instructions (Signed)
I will need to rule out a blood clot in your lungs since you are taking birth control  If the CT of the chest is normal,  You pain is likely coming from muscle spasm and should improve with exercise, Ibuprofen and baclofen   I have given you a short term prescription for vicodin bit I will not refill it

## 2015-02-21 ENCOUNTER — Encounter: Payer: Self-pay | Admitting: Internal Medicine

## 2015-02-21 NOTE — Assessment & Plan Note (Addendum)
Patient was unable to provide a urine specimen but denies hematuria and dysuria.  She was sent for chest CT/angio to rule out PE given her morbid obesity, sedentary lifestyle  and use of oral contraceptives and report of pleuritic pain .  CT was negative.   Recommend treat as muscle pain due to body habitus and prolonged slouching on couch.

## 2015-02-23 ENCOUNTER — Telehealth: Payer: Self-pay | Admitting: Internal Medicine

## 2015-02-23 NOTE — Telephone Encounter (Signed)
Patient notified and voiced understanding.

## 2015-02-23 NOTE — Telephone Encounter (Signed)
HER ct EVALUATED LUNGS AND GALLBLADDER AND ALL WERE NORMAL EXCEPT FOR AN INCIDENTAL FINDING THAT REQUIRES A REPEAT CT SCAN OF LUNGS IN ONE YEAR. .  THIS TINY NODULE IS 2 MM AND NOT THE SOURCE OF HER PAIN.  HER BILATERAL SIDE PAIN APPEARS TO BE FROM ACHING MUSCLES FROM SITTING AROUND ON THE COUCH ALL DAY AND ALLOWING HERSELF TO BECOME "SOFT"  .  IF SHE WANTS PHYSICAL THERAPY,  i WILL ORDER,  BUT i OTHERWISE SHE NEEDS TO ADDRESS HER WEIGHT GAIN AND DECONDITIONING WITH DIET AND EXERCISE

## 2015-03-05 ENCOUNTER — Other Ambulatory Visit: Payer: Self-pay | Admitting: Internal Medicine

## 2015-03-08 MED ORDER — ESCITALOPRAM OXALATE 10 MG PO TABS: 10.0000 mg | ORAL_TABLET | Freq: Every day | ORAL | Status: DC

## 2015-03-31 ENCOUNTER — Encounter: Payer: Self-pay | Admitting: Internal Medicine

## 2015-05-31 ENCOUNTER — Telehealth: Payer: Self-pay | Admitting: Internal Medicine

## 2015-05-31 MED ORDER — ESCITALOPRAM OXALATE 10 MG PO TABS: 10.0000 mg | ORAL_TABLET | Freq: Every day | ORAL | Status: DC

## 2015-05-31 NOTE — Addendum Note (Signed)
Addended by: Crecencio Mc on: 05/31/2015 11:03 PM   Modules accepted: Orders

## 2015-05-31 NOTE — Telephone Encounter (Signed)
Ok to refill,  Refill sent  

## 2015-06-01 ENCOUNTER — Encounter: Payer: Self-pay | Admitting: Emergency Medicine

## 2015-06-01 ENCOUNTER — Emergency Department
Admission: EM | Admit: 2015-06-01 | Discharge: 2015-06-01 | Disposition: A | Discharge status: Home / Self Care | Payer: Medicare Other | Attending: Emergency Medicine | Admitting: Emergency Medicine

## 2015-06-01 ENCOUNTER — Emergency Department: Payer: Medicare Other

## 2015-06-01 ENCOUNTER — Telehealth: Payer: Self-pay | Admitting: Internal Medicine

## 2015-06-01 DIAGNOSIS — I10 Essential (primary) hypertension: Secondary | ICD-10-CM | POA: Diagnosis not present

## 2015-06-01 DIAGNOSIS — Z3202 Encounter for pregnancy test, result negative: Secondary | ICD-10-CM | POA: Diagnosis not present

## 2015-06-01 DIAGNOSIS — Z792 Long term (current) use of antibiotics: Secondary | ICD-10-CM | POA: Diagnosis not present

## 2015-06-01 DIAGNOSIS — R109 Unspecified abdominal pain: Secondary | ICD-10-CM | POA: Insufficient documentation

## 2015-06-01 DIAGNOSIS — K5900 Constipation, unspecified: Secondary | ICD-10-CM

## 2015-06-01 DIAGNOSIS — Z79899 Other long term (current) drug therapy: Secondary | ICD-10-CM | POA: Insufficient documentation

## 2015-06-01 HISTORY — DX: Essential (primary) hypertension: I10

## 2015-06-01 LAB — URINALYSIS COMPLETE WITH MICROSCOPIC (ARMC ONLY)
Bilirubin Urine: NEGATIVE
Glucose, UA: NEGATIVE mg/dL
Hgb urine dipstick: NEGATIVE
Ketones, ur: NEGATIVE mg/dL
Nitrite: NEGATIVE
Protein, ur: NEGATIVE mg/dL
Specific Gravity, Urine: 1.014 (ref 1.005–1.030)
pH: 6 (ref 5.0–8.0)

## 2015-06-01 LAB — CBC WITH DIFFERENTIAL/PLATELET
Basophils Absolute: 0 10*3/uL (ref 0–0.1)
Basophils Relative: 1 %
Eosinophils Absolute: 0.1 10*3/uL (ref 0–0.7)
Eosinophils Relative: 1 %
HCT: 42.1 % (ref 35.0–47.0)
Hemoglobin: 13.8 g/dL (ref 12.0–16.0)
Lymphocytes Relative: 22 %
Lymphs Abs: 1.5 10*3/uL (ref 1.0–3.6)
MCH: 29.1 pg (ref 26.0–34.0)
MCHC: 32.7 g/dL (ref 32.0–36.0)
MCV: 88.8 fL (ref 80.0–100.0)
Monocytes Absolute: 0.3 10*3/uL (ref 0.2–0.9)
Monocytes Relative: 4 %
Neutro Abs: 4.9 10*3/uL (ref 1.4–6.5)
Neutrophils Relative %: 72 %
Platelets: 284 10*3/uL (ref 150–440)
RBC: 4.73 MIL/uL (ref 3.80–5.20)
RDW: 13.5 % (ref 11.5–14.5)
WBC: 6.8 10*3/uL (ref 3.6–11.0)

## 2015-06-01 LAB — HEPATIC FUNCTION PANEL
ALT: 27 U/L (ref 14–54)
AST: 31 U/L (ref 15–41)
Albumin: 4.2 g/dL (ref 3.5–5.0)
Alkaline Phosphatase: 64 U/L (ref 38–126)
Bilirubin, Direct: <0.1 mg/dL — ABNORMAL LOW (ref 0.1–0.5)
Total Bilirubin: 0.4 mg/dL (ref 0.3–1.2)
Total Protein: 7.8 g/dL (ref 6.5–8.1)

## 2015-06-01 LAB — BASIC METABOLIC PANEL
Anion gap: 9 (ref 5–15)
BUN: 10 mg/dL (ref 6–20)
CO2: 26 mmol/L (ref 22–32)
Calcium: 9.6 mg/dL (ref 8.9–10.3)
Chloride: 107 mmol/L (ref 101–111)
Creatinine, Ser: 0.7 mg/dL (ref 0.44–1.00)
GFR calc Af Amer: >60 mL/min (ref >60)
GFR calc non Af Amer: >60 mL/min (ref >60)
Glucose, Bld: 86 mg/dL (ref 65–99)
Potassium: 4.1 mmol/L (ref 3.5–5.1)
Sodium: 142 mmol/L (ref 135–145)

## 2015-06-01 LAB — POCT PREGNANCY, URINE: Preg Test, Ur: NEGATIVE

## 2015-06-01 LAB — LIPASE, BLOOD: Lipase: 38 U/L (ref 22–51)

## 2015-06-01 MED ORDER — DOCUSATE SODIUM 100 MG PO CAPS: 200.0000 mg | ORAL_CAPSULE | Freq: Two times a day (BID) | ORAL | Status: DC

## 2015-06-01 MED ORDER — KETOROLAC TROMETHAMINE 30 MG/ML IJ SOLN
30.0000 mg | Freq: Once | INTRAMUSCULAR | Status: AC
  Administered 2015-06-01: 30 mg via INTRAVENOUS

## 2015-06-01 MED ORDER — POLYETHYLENE GLYCOL 3350 17 GM/SCOOP PO POWD: ORAL | Status: DC

## 2015-06-01 MED ORDER — IOHEXOL 240 MG/ML SOLN
25.0000 mL | Freq: Once | INTRAMUSCULAR | Status: AC | PRN
  Administered 2015-06-01: 25 mL via INTRAVENOUS

## 2015-06-01 MED ORDER — SODIUM CHLORIDE 0.9 % IV BOLUS (SEPSIS)
1000.0000 mL | Freq: Once | INTRAVENOUS | Status: AC
  Administered 2015-06-01: 1000 mL via INTRAVENOUS

## 2015-06-01 MED ORDER — IOHEXOL 300 MG/ML  SOLN
100.0000 mL | Freq: Once | INTRAMUSCULAR | Status: AC | PRN
  Administered 2015-06-01: 100 mL via INTRAVENOUS

## 2015-06-01 MED ORDER — KETOROLAC TROMETHAMINE 30 MG/ML IJ SOLN
INTRAMUSCULAR | Status: AC
  Administered 2015-06-01: 30 mg via INTRAVENOUS
  Filled 2015-06-01: qty 1

## 2015-06-01 MED ORDER — SENNA 8.6 MG PO TABS: 2.0000 | ORAL_TABLET | Freq: Two times a day (BID) | ORAL | Status: DC

## 2015-06-01 NOTE — ED Notes (Signed)
Having left sided abd pain

## 2015-06-01 NOTE — ED Provider Notes (Signed)
[facility] Emergency Department Provider Note  ____________________________________________  Time seen: 11:20 AM  I have reviewed the triage vital signs and the nursing notes.   HISTORY  Chief Complaint Abdominal Pain    HPI Margaret Zuniga is a 34 y.o. female who complains of left flank pain for the past 4 or 5 days. She reports that it is constant and gradually worsening, nonradiating, sharp, no aggravating or alleviating factors, no associated symptoms, moderate intensity. She still eating and drinking normally, no fevers chills chest pain shortness of breath back pain dysuria frequency urgency hematuria vaginal bleeding or discharge. She cannot pinpoint any symptoms whatsoever except for the left flank pain.      Past Medical History  Diagnosis Date  . 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 Shoulder impingement 2009    surgical relesase, Dr. Marry Guan  . Screening for cervical cancer May 2012    , reportedly normal  . 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 Personal history of traumatic brain injury 67  . Hypertension     Patient Active Problem List   Diagnosis Date Noted  . Viral URI with cough 12/03/2014  . Bilateral thoracic back pain 04/21/2014  . Encounter for preventive health examination 04/21/2014  . Major depressive disorder, recurrent episode, moderate 05/21/2013  . Knee pain, chronic 03/26/2013  . Sinus tachycardia 12/17/2012  . Personal history of traumatic brain injury   . 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 Hypertension 10/29/2011  . Shoulder impingement   . Paralysis     Past Surgical History  Procedure Laterality Date  . Leg surgery  1985  . Tonsillectomy  2001  . Eye surgery  1995  . Subacromial decompression  2000    Right shoulder, [place]    Current Outpatient Rx  Name  Route  Sig   Dispense  Refill  . amoxicillin-clavulanate (AUGMENTIN) 875-125 MG per tablet   Oral   Take 1 tablet by mouth 2 (two) times daily.   14 tablet   0   . atenolol (TENORMIN) 25 MG tablet   Oral   Take 25 mg by mouth daily.      2   . B Complex-C-Folic Acid (MULTIVITAMIN, STRESS FORMULA) tablet   Oral   Take 1 tablet by mouth daily.           . baclofen (LIORESAL) 10 MG tablet   Oral   Take 10 mg by mouth 3 (three) times daily.          . bisacodyl (DULCOLAX) 5 MG EC tablet   Oral   Take 5 mg by mouth daily as needed. For constipation         . CAMRESE 0.15-0.03 &0.01 MG tablet      TAKE 1 TABLET BY MOUTH DAILY.   91 tablet   3   . escitalopram (LEXAPRO) 10 MG tablet   Oral   Take 1 tablet (10 mg total) by mouth daily.   90 tablet   0   . Melatonin 5 MG TABS   Oral   Take by mouth daily.         . cetirizine (ZYRTEC) 10 MG tablet   Oral   Take 10 mg by mouth daily.         Marland Kitchen docusate sodium (COLACE) 100 MG capsule  Oral   Take 2 capsules (200 mg total) by mouth 2 (two) times daily.   120 capsule   0   . HYDROcodone-acetaminophen (NORCO) 10-325 MG per tablet   Oral   Take 1 tablet by mouth every 8 (eight) hours as needed.   30 tablet   0   . polyethylene glycol powder (GLYCOLAX/MIRALAX) powder      2 cap fulls in a full glass of water, three times a day, for 5 days.   255 g   0   . predniSONE (STERAPRED UNI-PAK) 10 MG tablet      6 tablets on Day 1 , then reduce by 1 tablet daily until gone   21 tablet   0   . senna (SENOKOT) 8.6 MG TABS tablet   Oral   Take 2 tablets (17.2 mg total) by mouth 2 (two) times daily.   120 each   0     Allergies Zonegran  Family History  Problem Relation Age of Onset  . Diabetes Mother   . Coronary artery disease Mother   . Hyperlipidemia Mother   . Hypertension Mother   . Heart disease Maternal Grandfather     Social History History  Substance Use Topics  . Smoking status: Never Smoker    . Smokeless tobacco: Never Used  . Alcohol Use: No    Review of Systems  Constitutional: No fever or chills. No weight changes Eyes:No blurry vision or double vision.  ENT: No sore throat. Cardiovascular: No chest pain. Respiratory: No dyspnea or cough. Gastrointestinal: Abdominal pain as above.No BRBPR or melena. Genitourinary: Negative for dysuria, urinary retention, bloody urine, or difficulty urinating. Musculoskeletal: Negative for back pain. No joint swelling or pain. Skin: Negative for rash. Neurological: Negative for headaches, focal weakness or numbness. Psychiatric:No anxiety or depression.   Endocrine:No hot/cold intolerance, changes in energy, or sleep difficulty.  10-point ROS otherwise negative.  ____________________________________________   PHYSICAL EXAM:  VITAL SIGNS: ED Triage Vitals  Enc Vitals Group     BP 06/01/15 1116 133/100 mmHg     Pulse Rate 06/01/15 1116 75     Resp 06/01/15 1116 20     Temp 06/01/15 1116 98.5 F (36.9 C)     Temp Source 06/01/15 1116 Oral     SpO2 06/01/15 1116 98 %     Weight 06/01/15 1116 210 lb (95.255 kg)     Height 06/01/15 1116 5\' 3"  (1.6 m)     Head Cir --      Peak Flow --      Pain Score 06/01/15 1117 8     Pain Loc --      Pain Edu? --      Excl. in [place]? --      Constitutional: Alert and oriented. Well appearing and in no distress. Eyes: No scleral icterus. No conjunctival pallor. PERRL. EOMI ENT   Head: Normocephalic and atraumatic.   Nose: No congestion/rhinnorhea. No septal hematoma   Mouth/Throat: MMM, no pharyngeal erythema. No peritonsillar mass. No uvula shift.   Neck: No stridor. No SubQ emphysema. No meningismus. Hematological/Lymphatic/Immunilogical: No cervical lymphadenopathy. Cardiovascular: RRR. Normal and symmetric distal pulses are present in all extremities. No murmurs, rubs, or gallops. Respiratory: Normal respiratory effort without tachypnea nor retractions. Breath sounds  are clear and equal bilaterally. No wheezes/rales/rhonchi. Gastrointestinal: soft with epigastric, left side, right lower quadrant tenderness. No distention. There is no CVA tenderness.  No rebound, rigidity, or guarding. Genitourinary: deferred Musculoskeletal: Nontender with normal range  of motion in all extremities. No joint effusions.  No lower extremity tenderness.  No edema. Neurologic:   Normal speech and language.  CN 2-10 normal. Motor grossly intact. No pronator drift.  Normal gait. No gross focal neurologic deficits are appreciated.  Skin:  Skin is warm, dry and intact. No rash noted.  No petechiae, purpura, or bullae. Psychiatric: Mood and affect are normal. Speech and behavior are normal. Patient exhibits appropriate insight and judgment.  ____________________________________________    LABS (pertinent positives/negatives) (all labs ordered are listed, but only abnormal results are displayed) Labs Reviewed  URINALYSIS COMPLETEWITH MICROSCOPIC ([place]) - Abnormal; Notable for the following:    Color, Urine YELLOW (*)    APPearance CLEAR (*)    Leukocytes, UA TRACE (*)    Bacteria, UA RARE (*)    Squamous Epithelial / LPF 0-5 (*)    All other components within normal limits  HEPATIC FUNCTION PANEL - Abnormal; Notable for the following:    Bilirubin, Direct <0.1 (*)    All other components within normal limits  BASIC METABOLIC PANEL  LIPASE, BLOOD  CBC WITH DIFFERENTIAL/PLATELET  POC URINE PREG, ED  POCT PREGNANCY, URINE   ____________________________________________   EKG    ____________________________________________    RADIOLOGY  CT abdomen and pelvis unremarkable except for significant constipation  ____________________________________________   PROCEDURES  ____________________________________________   INITIAL IMPRESSION / ASSESSMENT AND PLAN / ED COURSE  Pertinent labs & imaging results that were available during my care of the patient  were reviewed by me and considered in my medical decision making (see chart for details).  Initial urinalysis unremarkable, so we proceeded with labs and CT abdomen and pelvis to evaluate for appendicitis or other acute pathology. The workup is entirely unremarkable except for large stool burden consistent with constipation. I will start the patient on senna Colace and MiraLAX for relief of the symptoms. She can follow up with primary care as needed she is medically stable and good condition at this time  ____________________________________________   FINAL CLINICAL IMPRESSION(S) / ED DIAGNOSES  Final diagnoses:  Left sided abdominal pain  Constipation, unspecified constipation type      Carrie Mew, MD 06/01/15 1450

## 2015-06-01 NOTE — Telephone Encounter (Signed)
FYI, spoke with pt.  She is going to the ER as advised.

## 2015-06-01 NOTE — Telephone Encounter (Signed)
Patient Name: Margaret Zuniga  DOB: [birth year]    Initial Comment caller states she has severe left side abdomen pain   Nurse Assessment  Nurse: Leilani Merl, RN, Heather Date/Time (Eastern Time): 06/01/2015 9:14:51 AM  Confirm and document reason for call. If symptomatic, describe symptoms. ---Caller states that she has been having upper stomach pain for about a year that has gotten worse in the last couple of days.  Has the patient traveled out of the country within the last 30 days? ---Not Applicable  Does the patient require triage? ---Yes  Related visit to physician within the last 2 weeks? ---No  Does the PT have any chronic conditions? (i.e. diabetes, asthma, etc.) ---No  Did the patient indicate they were pregnant? ---No     Guidelines    Guideline Title Affirmed Question Affirmed Notes  Abdominal Pain - Upper [1] SEVERE pain (e.g., excruciating) AND [2] present > 1 hour    Final Disposition User   Go to ED Now [place], RN, [employer]

## 2015-06-01 NOTE — ED Notes (Signed)
Patient returned from [place]. Placed back on monitor. Will await results. Resting comfortably on stretcher. Mother at bedside.

## 2015-06-01 NOTE — Discharge Instructions (Signed)
Abdominal Pain, Women °Abdominal (stomach, pelvic, or belly) pain can be caused by many things. It is important to tell your doctor: °· The location of the pain. °· Does it come and go or is it present all the time? °· Are there things that start the pain (eating certain foods, exercise)? °· Are there other symptoms associated with the pain (fever, nausea, vomiting, diarrhea)? °All of this is helpful to know when trying to find the cause of the pain. °CAUSES  °· Stomach: virus or bacteria infection, or ulcer. °· Intestine: appendicitis (inflamed appendix), regional ileitis (Crohn's disease), ulcerative colitis (inflamed colon), irritable bowel syndrome, diverticulitis (inflamed diverticulum of the colon), or cancer of the stomach or intestine. °· Gallbladder disease or stones in the gallbladder. °· Kidney disease, kidney stones, or infection. °· Pancreas infection or cancer. °· Fibromyalgia (pain disorder). °· Diseases of the female organs: °¨ Uterus: fibroid (non-cancerous) tumors or infection. °¨ Fallopian tubes: infection or tubal pregnancy. °¨ Ovary: cysts or tumors. °¨ Pelvic adhesions (scar tissue). °¨ Endometriosis (uterus lining tissue growing in the pelvis and on the pelvic organs). °¨ Pelvic congestion syndrome (female organs filling up with blood just before the menstrual period). °¨ Pain with the menstrual period. °¨ Pain with ovulation (producing an egg). °¨ Pain with an IUD (intrauterine device, birth control) in the uterus. °¨ Cancer of the female organs. °· Functional pain (pain not caused by a disease, may improve without treatment). °· Psychological pain. °· Depression. °DIAGNOSIS  °Your doctor will decide the seriousness of your pain by doing an examination. °· Blood tests. °· X-rays. °· Ultrasound. °· CT scan (computed tomography, special type of X-ray). °· MRI (magnetic resonance imaging). °· Cultures, for infection. °· Barium enema (dye inserted in the large intestine, to better view it with  X-rays). °· Colonoscopy (looking in intestine with a lighted tube). °· Laparoscopy (minor surgery, looking in abdomen with a lighted tube). °· Major abdominal exploratory surgery (looking in abdomen with a large incision). °TREATMENT  °The treatment will depend on the cause of the pain.  °· Many cases can be observed and treated at home. °· Over-the-counter medicines recommended by your caregiver. °· Prescription medicine. °· Antibiotics, for infection. °· Birth control pills, for painful periods or for ovulation pain. °· Hormone treatment, for endometriosis. °· Nerve blocking injections. °· Physical therapy. °· Antidepressants. °· Counseling with a psychologist or psychiatrist. °· Minor or major surgery. °HOME CARE INSTRUCTIONS  °· Do not take laxatives, unless directed by your caregiver. °· Take over-the-counter pain medicine only if ordered by your caregiver. Do not take aspirin because it can cause an upset stomach or bleeding. °· Try a clear liquid diet (broth or water) as ordered by your caregiver. Slowly move to a bland diet, as tolerated, if the pain is related to the stomach or intestine. °· Have a thermometer and take your temperature several times a day, and record it. °· Bed rest and sleep, if it helps the pain. °· Avoid sexual intercourse, if it causes pain. °· Avoid stressful situations. °· Keep your follow-up appointments and tests, as your caregiver orders. °· If the pain does not go away with medicine or surgery, you may try: °¨ Acupuncture. °¨ Relaxation exercises (yoga, meditation). °¨ Group therapy. °¨ Counseling. °SEEK MEDICAL CARE IF:  °· You notice certain foods cause stomach pain. °· Your home care treatment is not helping your pain. °· You need stronger pain medicine. °· You want your IUD removed. °· You feel faint or   lightheaded. °· You develop nausea and vomiting. °· You develop a rash. °· You are having side effects or an allergy to your medicine. °SEEK IMMEDIATE MEDICAL CARE IF:  °· Your  pain does not go away or gets worse. °· You have a fever. °· Your pain is felt only in portions of the abdomen. The right side could possibly be appendicitis. The left lower portion of the abdomen could be colitis or diverticulitis. °· You are passing blood in your stools (bright red or black tarry stools, with or without vomiting). °· You have blood in your urine. °· You develop chills, with or without a fever. °· You pass out. °MAKE SURE YOU:  °· Understand these instructions. °· Will watch your condition. °· Will get help right away if you are not doing well or get worse. °Document Released: 09/24/2007 Document Revised: 04/13/2014 Document Reviewed: 10/14/2009 °[employer]® Patient Information ©2015 [employer]. This information is not intended to replace advice given to you by your health care provider. Make sure you discuss any questions you have with your health care provider. ° °Constipation °Constipation is when a person has fewer than three bowel movements a week, has difficulty having a bowel movement, or has stools that are dry, hard, or larger than normal. As people grow older, constipation is more common. If you try to fix constipation with medicines that make you have a bowel movement (laxatives), the problem may get worse. Long-term laxative use may cause the muscles of the colon to become weak. A low-fiber diet, not taking in enough fluids, and taking certain medicines may make constipation worse.  °CAUSES  °· Certain medicines, such as antidepressants, pain medicine, iron supplements, antacids, and water pills.   °· Certain diseases, such as diabetes, irritable bowel syndrome (IBS), thyroid disease, or depression.   °· Not drinking enough water.   °· Not eating enough fiber-rich foods.   °· Stress or travel.   °· Lack of physical activity or exercise.   °· Ignoring the urge to have a bowel movement.   °· Using laxatives too much.   °SIGNS AND SYMPTOMS  °· Having fewer than three bowel movements a  week.   °· Straining to have a bowel movement.   °· Having stools that are hard, dry, or larger than normal.   °· Feeling full or bloated.   °· Pain in the lower abdomen.   °· Not feeling relief after having a bowel movement.   °DIAGNOSIS  °Your health care provider will take a medical history and perform a physical exam. Further testing may be done for severe constipation. Some tests may include: °· A barium enema X-ray to examine your rectum, colon, and, sometimes, your small intestine.   °· A sigmoidoscopy to examine your lower colon.   °· A colonoscopy to examine your entire colon. °TREATMENT  °Treatment will depend on the severity of your constipation and what is causing it. Some dietary treatments include drinking more fluids and eating more fiber-rich foods. Lifestyle treatments may include regular exercise. If these diet and lifestyle recommendations do not help, your health care provider may recommend taking over-the-counter laxative medicines to help you have bowel movements. Prescription medicines may be prescribed if over-the-counter medicines do not work.  °HOME CARE INSTRUCTIONS  °· Eat foods that have a lot of fiber, such as fruits, vegetables, whole grains, and beans. °· Limit foods high in fat and processed sugars, such as french fries, hamburgers, cookies, candies, and soda.   °· A fiber supplement may be added to your diet if you cannot get enough fiber from foods.   °·   Drink enough fluids to keep your urine clear or pale yellow.   °· Exercise regularly or as directed by your health care provider.   °· Go to the restroom when you have the urge to go. Do not hold it.   °· Only take over-the-counter or prescription medicines as directed by your health care provider. Do not take other medicines for constipation without talking to your health care provider first.   °SEEK IMMEDIATE MEDICAL CARE IF:  °· You have bright red blood in your stool.   °· Your constipation lasts for more than 4 days or gets  worse.   °· You have abdominal or rectal pain.   °· You have thin, pencil-like stools.   °· You have unexplained weight loss. °MAKE SURE YOU:  °· Understand these instructions. °· Will watch your condition. °· Will get help right away if you are not doing well or get worse. °Document Released: 08/25/2004 Document Revised: 12/02/2013 Document Reviewed: 09/08/2013 °ExitCare® Patient Information ©2015 [employer]. This information is not intended to replace advice given to you by your health care provider. Make sure you discuss any questions you have with your health care provider. ° °

## 2015-06-01 NOTE — ED Notes (Signed)
Patient has had pain since last Thursday. Each day pain has increased. Denies any pain with urination or bleeding with urination.

## 2015-06-17 ENCOUNTER — Encounter: Payer: Self-pay | Admitting: Internal Medicine

## 2015-07-01 ENCOUNTER — Emergency Department
Admission: EM | Admit: 2015-07-01 | Discharge: 2015-07-01 | Disposition: A | Discharge status: Home / Self Care | Payer: Medicare Other | Attending: Emergency Medicine | Admitting: Emergency Medicine

## 2015-07-01 ENCOUNTER — Encounter: Payer: Self-pay | Admitting: Emergency Medicine

## 2015-07-01 ENCOUNTER — Emergency Department: Payer: Medicare Other

## 2015-07-01 DIAGNOSIS — I10 Essential (primary) hypertension: Secondary | ICD-10-CM | POA: Insufficient documentation

## 2015-07-01 DIAGNOSIS — R10811 Right upper quadrant abdominal tenderness: Secondary | ICD-10-CM

## 2015-07-01 DIAGNOSIS — Z79899 Other long term (current) drug therapy: Secondary | ICD-10-CM | POA: Insufficient documentation

## 2015-07-01 DIAGNOSIS — Z792 Long term (current) use of antibiotics: Secondary | ICD-10-CM | POA: Insufficient documentation

## 2015-07-01 DIAGNOSIS — R1011 Right upper quadrant pain: Secondary | ICD-10-CM | POA: Insufficient documentation

## 2015-07-01 DIAGNOSIS — K5900 Constipation, unspecified: Secondary | ICD-10-CM | POA: Diagnosis not present

## 2015-07-01 DIAGNOSIS — R109 Unspecified abdominal pain: Secondary | ICD-10-CM | POA: Diagnosis present

## 2015-07-01 LAB — URINALYSIS COMPLETE WITH MICROSCOPIC (ARMC ONLY)
Bacteria, UA: NONE SEEN
Bilirubin Urine: NEGATIVE
Glucose, UA: NEGATIVE mg/dL
Hgb urine dipstick: NEGATIVE
Ketones, ur: NEGATIVE mg/dL
Leukocytes, UA: NEGATIVE
Nitrite: NEGATIVE
Protein, ur: NEGATIVE mg/dL
Specific Gravity, Urine: 1.021 (ref 1.005–1.030)
pH: 5 (ref 5.0–8.0)

## 2015-07-01 LAB — COMPREHENSIVE METABOLIC PANEL
ALT: 28 U/L (ref 14–54)
AST: 32 U/L (ref 15–41)
Albumin: 4.5 g/dL (ref 3.5–5.0)
Alkaline Phosphatase: 59 U/L (ref 38–126)
Anion gap: 11 (ref 5–15)
BUN: 12 mg/dL (ref 6–20)
CO2: 23 mmol/L (ref 22–32)
Calcium: 9.6 mg/dL (ref 8.9–10.3)
Chloride: 106 mmol/L (ref 101–111)
Creatinine, Ser: 0.84 mg/dL (ref 0.44–1.00)
GFR calc Af Amer: >60 mL/min (ref >60)
GFR calc non Af Amer: >60 mL/min (ref >60)
Glucose, Bld: 131 mg/dL — ABNORMAL HIGH (ref 65–99)
Potassium: 3.6 mmol/L (ref 3.5–5.1)
Sodium: 140 mmol/L (ref 135–145)
Total Bilirubin: 0.5 mg/dL (ref 0.3–1.2)
Total Protein: 7.9 g/dL (ref 6.5–8.1)

## 2015-07-01 LAB — LIPASE, BLOOD: Lipase: 29 U/L (ref 22–51)

## 2015-07-01 MED ORDER — RANITIDINE HCL 150 MG PO CAPS: 150.0000 mg | ORAL_CAPSULE | Freq: Two times a day (BID) | ORAL | Status: DC

## 2015-07-01 NOTE — ED Notes (Signed)
Pt reports being seen here for abd pain a month ago, states she has a cyst on right ovary.  Having increased pain today.

## 2015-07-01 NOTE — Discharge Instructions (Signed)
Abdominal Pain Many things can cause abdominal pain. Usually, abdominal pain is not caused by a disease and will improve without treatment. It can often be observed and treated at home. Your health care provider will do a physical exam and possibly order blood tests and X-rays to help determine the seriousness of your pain. However, in many cases, more time must pass before a clear cause of the pain can be found. Before that point, your health care provider may not know if you need more testing or further treatment. HOME CARE INSTRUCTIONS  Monitor your abdominal pain for any changes. The following actions may help to alleviate any discomfort you are experiencing:  Only take over-the-counter or prescription medicines as directed by your health care provider.  Do not take laxatives unless directed to do so by your health care provider.  Try a clear liquid diet (broth, tea, or water) as directed by your health care provider. Slowly move to a bland diet as tolerated. SEEK MEDICAL CARE IF:  You have unexplained abdominal pain.  You have abdominal pain associated with nausea or diarrhea.  You have pain when you urinate or have a bowel movement.  You experience abdominal pain that wakes you in the night.  You have abdominal pain that is worsened or improved by eating food.  You have abdominal pain that is worsened with eating fatty foods.  You have a fever. SEEK IMMEDIATE MEDICAL CARE IF:   Your pain does not go away within 2 hours.  You keep throwing up (vomiting).  Your pain is felt only in portions of the abdomen, such as the right side or the left lower portion of the abdomen.  You pass bloody or black tarry stools. MAKE SURE YOU:  Understand these instructions.   Will watch your condition.   Will get help right away if you are not doing well or get worse.  Document Released: 09/06/2005 Document Revised: 12/02/2013 Document Reviewed: 08/06/2013 [facility] Patient Information  2015 [place], [place]. This information is not intended to replace advice given to you by your health care provider. Make sure you discuss any questions you have with your health care provider.   Your blood tests, urine tests, an ultrasound of the gallbladder were all unremarkable today. Take Zantac for stomach acid suppression to try to relieve your symptoms and continue with your laxatives. Follow up with primary care in 1 week.

## 2015-07-01 NOTE — ED Provider Notes (Signed)
[facility] Emergency Department Provider Note  ____________________________________________  Time seen: 2:05 PM  I have reviewed the triage vital signs and the nursing notes.   HISTORY  Chief Complaint Abdominal Pain    HPI Margaret Zuniga is a 34 y.o. female who complains of upper abdominal pain today. It's been waxing and waning but constant, nonradiating, aching, moderate intensity. No fevers chills nausea vomiting diarrhea. She does complain of some increased pain after eating. No history of gallstones or biliary disease. She is concerned it might be related to a complex ovarian cyst found on a prior CT scan.     Past Medical History  Diagnosis Date  . 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 Shoulder impingement 2009    surgical relesase, Dr. Marry Guan  . Screening for cervical cancer May 2012    , reportedly normal  . 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 Personal history of traumatic brain injury 2  . Hypertension     Patient Active Problem List   Diagnosis Date Noted  . Viral URI with cough 12/03/2014  . Bilateral thoracic back pain 04/21/2014  . Encounter for preventive health examination 04/21/2014  . Major depressive disorder, recurrent episode, moderate 05/21/2013  . Knee pain, chronic 03/26/2013  . Sinus tachycardia 12/17/2012  . Personal history of traumatic brain injury   . 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 Hypertension 10/29/2011  . Shoulder impingement   . Paralysis     Past Surgical History  Procedure Laterality Date  . Leg surgery  1985  . Tonsillectomy  2001  . Eye surgery  1995  . Subacromial decompression  2000    Right shoulder, [place]    Current Outpatient Rx  Name  Route  Sig  Dispense  Refill  . amoxicillin-clavulanate (AUGMENTIN) 875-125 MG per tablet   Oral   Take 1  tablet by mouth 2 (two) times daily.   14 tablet   0   . atenolol (TENORMIN) 25 MG tablet   Oral   Take 25 mg by mouth daily.      2   . B Complex-C-Folic Acid (MULTIVITAMIN, STRESS FORMULA) tablet   Oral   Take 1 tablet by mouth daily.           . baclofen (LIORESAL) 10 MG tablet   Oral   Take 10 mg by mouth 3 (three) times daily.          . bisacodyl (DULCOLAX) 5 MG EC tablet   Oral   Take 5 mg by mouth daily as needed. For constipation         . CAMRESE 0.15-0.03 &0.01 MG tablet      TAKE 1 TABLET BY MOUTH DAILY.   91 tablet   3   . cetirizine (ZYRTEC) 10 MG tablet   Oral   Take 10 mg by mouth daily.         Marland Kitchen docusate sodium (COLACE) 100 MG capsule   Oral   Take 2 capsules (200 mg total) by mouth 2 (two) times daily.   120 capsule   0   . escitalopram (LEXAPRO) 10 MG tablet   Oral   Take 1 tablet (10 mg total) by mouth daily.   90 tablet   0   . HYDROcodone-acetaminophen (NORCO) 10-325 MG per tablet   Oral  Take 1 tablet by mouth every 8 (eight) hours as needed.   30 tablet   0   . Melatonin 5 MG TABS   Oral   Take by mouth daily.         . polyethylene glycol powder (GLYCOLAX/MIRALAX) powder      2 cap fulls in a full glass of water, three times a day, for 5 days.   255 g   0   . predniSONE (STERAPRED UNI-PAK) 10 MG tablet      6 tablets on Day 1 , then reduce by 1 tablet daily until gone   21 tablet   0   . ranitidine (ZANTAC) 150 MG capsule   Oral   Take 1 capsule (150 mg total) by mouth 2 (two) times daily.   28 capsule   0   . senna (SENOKOT) 8.6 MG TABS tablet   Oral   Take 2 tablets (17.2 mg total) by mouth 2 (two) times daily.   120 each   0     Allergies Zonegran  Family History  Problem Relation Age of Onset  . Diabetes Mother   . Coronary artery disease Mother   . Hyperlipidemia Mother   . Hypertension Mother   . Heart disease Maternal Grandfather     Social History History  Substance Use Topics   . Smoking status: Never Smoker   . Smokeless tobacco: Never Used  . Alcohol Use: No    Review of Systems  Constitutional: No fever or chills. No weight changes Eyes:No blurry vision or double vision.  ENT: No sore throat. Cardiovascular: No chest pain. Respiratory: No dyspnea or cough. Gastrointestinal: Abdominal pain as above.  No BRBPR or melena. Genitourinary: Negative for dysuria, urinary retention, bloody urine, or difficulty urinating. Musculoskeletal: Negative for back pain. No joint swelling or pain. Skin: Negative for rash. Neurological: Negative for headaches, focal weakness or numbness. Psychiatric:No anxiety or depression.   Endocrine:No hot/cold intolerance, changes in energy, or sleep difficulty.  10-point ROS otherwise negative.  ____________________________________________   PHYSICAL EXAM:  VITAL SIGNS: ED Triage Vitals  Enc Vitals Group     BP 07/01/15 1126 145/93 mmHg     Pulse Rate 07/01/15 1126 80     Resp 07/01/15 1126 18     Temp 07/01/15 1126 98.3 F (36.8 C)     Temp Source 07/01/15 1126 Oral     SpO2 07/01/15 1126 98 %     Weight 07/01/15 1126 202 lb (91.627 kg)     Height 07/01/15 1126 5\' 3"  (1.6 m)     Head Cir --      Peak Flow --      Pain Score 07/01/15 1127 9     Pain Loc --      Pain Edu? --      Excl. in [place]? --      Constitutional: Alert and oriented. Well appearing and in no distress. Eyes: No scleral icterus. No conjunctival pallor. PERRL. EOMI ENT   Head: Normocephalic and atraumatic.   Nose: No congestion/rhinnorhea. No septal hematoma   Mouth/Throat: MMM, no pharyngeal erythema. No peritonsillar mass. No uvula shift.   Neck: No stridor. No SubQ emphysema. No meningismus. Hematological/Lymphatic/Immunilogical: No cervical lymphadenopathy. Cardiovascular: RRR. Normal and symmetric distal pulses are present in all extremities. No murmurs, rubs, or gallops. Respiratory: Normal respiratory effort without  tachypnea nor retractions. Breath sounds are clear and equal bilaterally. No wheezes/rales/rhonchi. Gastrointestinal: Mild right upper quadrant tenderness. No distention. There is  no CVA tenderness.  No rebound, rigidity, or guarding. Genitourinary: deferred Musculoskeletal: Nontender with normal range of motion in all extremities. No joint effusions.  No lower extremity tenderness.  No edema. Neurologic:   Normal speech and language.  CN 2-10 normal. Motor grossly intact. No pronator drift.  Normal gait. No gross focal neurologic deficits are appreciated.  Skin:  Skin is warm, dry and intact. No rash noted.  No petechiae, purpura, or bullae. Psychiatric: Mood and affect are normal. Speech and behavior are normal. Patient exhibits appropriate insight and judgment.  ____________________________________________    LABS (pertinent positives/negatives) (all labs ordered are listed, but only abnormal results are displayed) Labs Reviewed  COMPREHENSIVE METABOLIC PANEL - Abnormal; Notable for the following:    Glucose, Bld 131 (*)    All other components within normal limits  URINALYSIS COMPLETEWITH MICROSCOPIC ([facility] ONLY) - Abnormal; Notable for the following:    Color, Urine YELLOW (*)    APPearance CLEAR (*)    Squamous Epithelial / LPF 0-5 (*)    All other components within normal limits  LIPASE, BLOOD   ____________________________________________   EKG    ____________________________________________    RADIOLOGY  Ultrasound right upper quadrant unremarkable  ____________________________________________   PROCEDURES  ____________________________________________   INITIAL IMPRESSION / ASSESSMENT AND PLAN / ED COURSE  Pertinent labs & imaging results that were available during my care of the patient were reviewed by me and considered in my medical decision making (see chart for details).  Patient presents with right upper quadrant pain and tenderness with symptoms  suggestive of biliary pathology. Labs and ultrasound are unremarkable. Patient is very well-appearing nontoxic no acute distress. Most likely due to her chronic constipation. I encouraged her to continue with senna Colace and MiraLAX as needed. She only takes the senna and Colace intermittently so a reinforced that she should be taking these every day. We'll have her follow up with primary care in a week to monitor her symptoms.  ____________________________________________   FINAL CLINICAL IMPRESSION(S) / ED DIAGNOSES  Final diagnoses:  RUQ abdominal tenderness  Right upper quadrant pain   chronic constipation    Carrie Mew, MD 07/01/15 [id number]

## 2015-07-06 ENCOUNTER — Ambulatory Visit (INDEPENDENT_AMBULATORY_CARE_PROVIDER_SITE_OTHER): Payer: Medicare Other | Admitting: Internal Medicine

## 2015-07-06 ENCOUNTER — Ambulatory Visit: Payer: Medicare Other | Admitting: Internal Medicine

## 2015-07-06 ENCOUNTER — Encounter: Payer: Self-pay | Admitting: Internal Medicine

## 2015-07-06 VITALS — BP 124/78 | HR 85 | Temp 98.4°F | Resp 12 | Ht 63.0 in | Wt 201.5 lb

## 2015-07-06 DIAGNOSIS — K5909 Other constipation: Secondary | ICD-10-CM

## 2015-07-06 DIAGNOSIS — N83201 Unspecified ovarian cyst, right side: Secondary | ICD-10-CM

## 2015-07-06 DIAGNOSIS — N8320 Unspecified ovarian cysts: Secondary | ICD-10-CM | POA: Diagnosis not present

## 2015-07-06 DIAGNOSIS — E669 Obesity, unspecified: Secondary | ICD-10-CM

## 2015-07-06 DIAGNOSIS — N839 Noninflammatory disorder of ovary, fallopian tube and broad ligament, unspecified: Secondary | ICD-10-CM | POA: Diagnosis not present

## 2015-07-06 DIAGNOSIS — K5901 Slow transit constipation: Secondary | ICD-10-CM | POA: Diagnosis not present

## 2015-07-06 DIAGNOSIS — N838 Other noninflammatory disorders of ovary, fallopian tube and broad ligament: Secondary | ICD-10-CM

## 2015-07-06 NOTE — Progress Notes (Signed)
Subjective:  Patient ID: Margaret Zuniga, female    DOB: Aug 07, 1981  Age: 34 y.o. MRN: [id number]  CC: The primary encounter diagnosis was Ovarian mass, right. Diagnoses of Other constipation, Ovarian cyst, right, Slow transit constipation, and Obesity were also pertinent to this visit.  HPI [name] presents for Er follow ups in June and July, for recurrent RUQ  and suprapubic pain .  Both ER vitis resulted in diagnosis and treatment of constipation .  During the June ER visit , a CT  was done in June and her appendix , liver,  GB , pancreas , spleen and kidneys were normal , but a possible complex cyst was noted  on the right ovary.   she was not informed of this (patient read the report on Mychart) and is requesting further evaluation   During the second ER visit in July for RUQ pain ,  Abdomen wasimaged with an ultrasound,  No gallstones or CBd dilation noted.   Since then she has been taking a daily  Dose of stool softener,  using miralax twice weekly  And sennakot daily.  She denies use of narcotics.       Outpatient Prescriptions Prior to Visit  Medication Sig Dispense Refill  . atenolol (TENORMIN) 25 MG tablet Take 25 mg by mouth daily.  2  . B Complex-C-Folic Acid (MULTIVITAMIN, STRESS FORMULA) tablet Take 1 tablet by mouth daily.      . baclofen (LIORESAL) 10 MG tablet Take 10 mg by mouth 3 (three) times daily.     . bisacodyl (DULCOLAX) 5 MG EC tablet Take 5 mg by mouth daily as needed. For constipation    . CAMRESE 0.15-0.03 &0.01 MG tablet TAKE 1 TABLET BY MOUTH DAILY. 91 tablet 3  . cetirizine (ZYRTEC) 10 MG tablet Take 10 mg by mouth daily.    Marland Kitchen docusate sodium (COLACE) 100 MG capsule Take 2 capsules (200 mg total) by mouth 2 (two) times daily. 120 capsule 0  . escitalopram (LEXAPRO) 10 MG tablet Take 1 tablet (10 mg total) by mouth daily. 90 tablet 0  . Melatonin 5 MG TABS Take by mouth daily.    . polyethylene glycol powder (GLYCOLAX/MIRALAX) powder 2 cap fulls in a full  glass of water, three times a day, for 5 days. 255 g 0  . ranitidine (ZANTAC) 150 MG capsule Take 1 capsule (150 mg total) by mouth 2 (two) times daily. 28 capsule 0  . senna (SENOKOT) 8.6 MG TABS tablet Take 2 tablets (17.2 mg total) by mouth 2 (two) times daily. 120 each 0  . HYDROcodone-acetaminophen (NORCO) 10-325 MG per tablet Take 1 tablet by mouth every 8 (eight) hours as needed. (Patient not taking: Reported on 07/06/2015) 30 tablet 0  . amoxicillin-clavulanate (AUGMENTIN) 875-125 MG per tablet Take 1 tablet by mouth 2 (two) times daily. 14 tablet 0  . predniSONE (STERAPRED UNI-PAK) 10 MG tablet 6 tablets on Day 1 , then reduce by 1 tablet daily until gone 21 tablet 0   No facility-administered medications prior to visit.    Review of Systems;  Patient denies headache, fevers, malaise, unintentional weight loss, skin rash, eye pain, sinus congestion and sinus pain, sore throat, dysphagia,  hemoptysis , cough, dyspnea, wheezing, chest pain, palpitations, orthopnea, edema, abdominal pain, nausea, melena, diarrhea, constipation, flank pain, dysuria, hematuria, urinary  Frequency, nocturia, numbness, tingling, seizures,  Focal weakness, Loss of consciousness,  Tremor, insomnia, depression, anxiety, and suicidal ideation.  Objective:  BP 124/78 mmHg  Pulse 85  Temp(Src) 98.4 F (36.9 C) (Oral)  Resp 12  Ht 5\' 3"  (1.6 m)  Wt 201 lb 8 oz (91.4 kg)  BMI 35.70 kg/m2  SpO2 99%  LMP 05/13/2015 (Exact Date)  BP Readings from Last 3 Encounters:  07/06/15 124/78  07/01/15 145/93  06/01/15 140/96    Wt Readings from Last 3 Encounters:  07/06/15 201 lb 8 oz (91.4 kg)  07/01/15 202 lb (91.627 kg)  06/01/15 210 lb (95.255 kg)    General appearance: alert, cooperative and appears stated age Ears: normal TM's and external ear canals both ears Throat: lips, mucosa, and tongue normal; teeth and gums normal Neck: no adenopathy, no carotid bruit, supple, symmetrical, trachea midline and  thyroid not enlarged, symmetric, no tenderness/mass/nodules Back: symmetric, no curvature. ROM normal. No CVA tenderness. Lungs: clear to auscultation bilaterally Heart: regular rate and rhythm, S1, S2 normal, no murmur, click, rub or gallop Abdomen: soft, non-tender; bowel sounds normal; no masses,  no organomegaly Pulses: 2+ and symmetric Skin: Skin color, texture, turgor normal. No rashes or lesions Lymph nodes: Cervical, supraclavicular, and axillary nodes normal.  No results found for: HGBA1C  Lab Results  Component Value Date   CREATININE 0.84 07/01/2015   CREATININE 0.70 06/01/2015   CREATININE 0.8 04/20/2014    Lab Results  Component Value Date   WBC 6.8 06/01/2015   HGB 13.8 06/01/2015   HCT 42.1 06/01/2015   PLT 284 06/01/2015   GLUCOSE 131* 07/01/2015   CHOL 174 12/18/2012   TRIG 135.0 12/18/2012   HDL 52.00 12/18/2012   LDLCALC 95 12/18/2012   ALT 28 07/01/2015   AST 32 07/01/2015   NA 140 07/01/2015   K 3.6 07/01/2015   CL 106 07/01/2015   CREATININE 0.84 07/01/2015   BUN 12 07/01/2015   CO2 23 07/01/2015   TSH 2.260 07/06/2015    US Abdomen Limited Ruq  07/01/2015   CLINICAL DATA:  One week history of right upper quadrant pain  EXAM: US ABDOMEN LIMITED - RIGHT UPPER QUADRANT  COMPARISON:  None.  FINDINGS: Gallbladder:  No gallstones or wall thickening visualized. There is no pericholecystic fluid. No sonographic Murphy sign noted.  Common bile duct:  Diameter: 5 mm. There is no intrahepatic or extrahepatic biliary duct dilatation.  Liver:  No focal lesion identified. Within normal limits in parenchymal echogenicity.  IMPRESSION: Study within normal limits.   Electronically Signed   By: Lowella Grip III M.D.   On: 07/01/2015 15:59    Assessment & Plan:   Problem List Items Addressed This Visit      Unprioritized   Ovarian cyst, right    Complex appearing on CT scan done by ER physician in June with no disclosure to patient at that time.  Referral to  Gynecology,  And CA 12 5 ordered.       Constipation    Etiology unclear.  Checking thyroid function.   Limit use of sennakot.  Prescribing golytely prep to cathartic followed by Continued daily  use of stool softeners and BFLs       Relevant Orders   T4 AND TSH (Completed)   Obesity    I have addressed  BMI and recommended a low glycemic index diet utilizing smaller more frequent meals to increase metabolism.  I have also recommended that patient start exercising with a goal of 30 minutes of aerobic exercise a minimum of 5 days per week.  Other Visit Diagnoses    Ovarian mass, right    -  Primary    Relevant Orders    CA 125 (Completed)    Ambulatory referral to Gynecology       I have discontinued Ms. Morrish's predniSONE and amoxicillin-clavulanate. I am also having her maintain her (multivitamin, stress formula), bisacodyl, cetirizine, baclofen, Melatonin, CAMRESE, atenolol, HYDROcodone-acetaminophen, escitalopram, docusate sodium, polyethylene glycol powder, senna, ranitidine, and verapamil.  Meds ordered this encounter  Medications  . verapamil (VERELAN PM) 120 MG 24 hr capsule    Sig: Take 1 capsule by mouth daily. To start when  finishes the atenolol    Refill:  1    Medications Discontinued During This Encounter  Medication Reason  . amoxicillin-clavulanate (AUGMENTIN) 875-125 MG per tablet Completed Course  . predniSONE (STERAPRED UNI-PAK) 10 MG tablet Completed Course    Follow-up: No Follow-up on file.   Crecencio Mc, MD

## 2015-07-06 NOTE — Progress Notes (Signed)
Pre-visit discussion using our clinic review tool. No additional management support is needed unless otherwise documented below in the visit note.  

## 2015-07-06 NOTE — Patient Instructions (Addendum)
I am prescribing GoLytely  To use to get yourself  "cleaned out"  Drink a glass every 30 minutes for at least 6 glasses,  Starting by 2 PM    The following day start using miralax every night along with a stool softener,  One of each.  Try not to use senna more than twice weekly.  About Constipation  Constipation Overview Constipation is the most common gastrointestinal complaint - about 4 million Americans experience constipation and make 2.5 million physician visits a year to get help for the problem.  Constipation can occur when the colon absorbs too much water, the colon's muscle contraction is slow or sluggish, and/or there is delayed transit time through the colon.  The result is stool that is hard and dry.  Indicators of constipation include straining during bowel movements greater than 25% of the time, having fewer than three bowel movements per week, and/or the feeling of incomplete evacuation.  There are established guidelines ([place] II ) for defining constipation. A person needs to have two or more of the following symptoms for at least 12 weeks (not necessarily consecutive) in the preceding 12 months: . Straining in  greater than 25% of bowel movements . Lumpy or hard stools in greater than 25% of bowel movements . Sensation of incomplete emptying in greater than 25% of bowel movements . Sensation of anorectal obstruction/blockade in greater than 25% of bowel movements . Manual maneuvers to help empty greater than 25% of bowel movements (e.g., digital evacuation, support of the pelvic floor)  . Less than  3 bowel movements/week . Loose stools are not present, and criteria for irritable bowel syndrome are insufficient  Common Causes of Constipation . Lack of fiber in your diet . Lack of physical activity . Medications, including iron and calcium supplements  . Dairy intake . Dehydration . Abuse of laxatives  Travel  Irritable Bowel Syndrome  Pregnancy  Luteal phase of  menstruation (after ovulation and before menses)  Colorectal problems  Intestinal Dysfunction  Treating Constipation  There are several ways of treating constipation, including changes to diet and exercise, use of laxatives, adjustments to the pelvic floor, and scheduled toileting.  These treatments include: . increasing fiber and fluids in the diet  . increasing physical activity . learning muscle coordination   learning proper toileting techniques and toileting modifications   designing and sticking  to a toileting schedule     2007, [employer] Doc.22   Fecal Impaction A fecal impaction happens when there is a large, firm amount of stool (or feces) that cannot be passed. The impacted stool is usually in the rectum, which is the lowest part of the large bowel. The impacted stool can block the colon and cause significant problems. CAUSES  The longer stool stays in the rectum, the harder it gets. Anything that slows down your bowel movements can lead to fecal impaction, such as:  Constipation. This can be a long-standing (chronic) problem or can happen suddenly (acute).  Painful conditions of the rectum, such as hemorrhoids or anal fissures. The pain of these conditions can make you try to avoid having bowel movements.  Narcotic pain-relieving medicines, such as methadone, morphine, or codeine.  Not drinking enough fluids.  Inactivity and bed rest over long periods of time.  Diseases of the brain or nervous system that damage the nerves controlling the muscles of the intestines. SIGNS AND SYMPTOMS   Lack of normal bowel movements or changes in bowel patterns.  Sense of fullness  in the rectum but unable to pass stool.  Pain or cramps in the abdominal area (often after meals).  Thin, watery discharge from the rectum. DIAGNOSIS  Your health care provider may suspect that you have a fecal impaction based on your symptoms and a physical exam. This will include  an exam of your rectum. Sometimes X-rays or lab testing may be needed to confirm the diagnosis and to be sure there are no other problems.  TREATMENT   Initially an impaction can be removed manually. Using a gloved finger, your health care provider can remove hard stool from your rectum.  Medicine is sometimes needed. A suppository or enema can be given in the rectum to soften the stool, which can stimulate a bowel movement. Medicines can also be given by mouth (orally).  Though rare, surgery may be needed if the colon has torn (perforated) due to blockage. HOME CARE INSTRUCTIONS   Develop regular bowel habits. This could include getting in the habit of having a bowel movement after your morning cup of coffee or after eating. Be sure to allow yourself enough time on the toilet.  Maintain a high-fiber diet.  Drink enough fluids to keep your urine clear or pale yellow as directed by your health care provider.  Exercise regularly.  If you begin to get constipated, increase the amount of fiber in your diet. Eat plenty of fruits, vegetables, whole wheat breads, bran, oatmeal, and similar products.  Take natural fiber laxatives or other laxatives only as directed by your health care provider. SEEK MEDICAL CARE IF:   You have ongoing rectal pain.  You require enemas or suppositories more than twice a week.  You have rectal bleeding.  You have continued problems, or you develop abdominal pain.  You have thin, pencil-like stools. SEEK IMMEDIATE MEDICAL CARE IF:  You have black or tarry stools. MAKE SURE YOU:   Understand these instructions.  Will watch your condition.  Will get help right away if you are not doing well or get worse. Document Released: 08/19/2004 Document Revised: 09/17/2013 Document Reviewed: 06/03/2013 [facility] Patient Information 2015 [place], [place]. This information is not intended to replace advice given to you by your health care provider. Make sure you discuss  any questions you have with your health care provider.

## 2015-07-07 LAB — CA 125: CA 125: 9 U/mL (ref <35)

## 2015-07-07 LAB — T4 AND TSH
T4, Total: 9.1 ug/dL (ref 4.5–12.0)
TSH: 2.26 u[IU]/mL (ref 0.450–4.500)

## 2015-07-08 DIAGNOSIS — E669 Obesity, unspecified: Secondary | ICD-10-CM | POA: Insufficient documentation

## 2015-07-08 DIAGNOSIS — K5900 Constipation, unspecified: Secondary | ICD-10-CM | POA: Insufficient documentation

## 2015-07-08 DIAGNOSIS — N83201 Unspecified ovarian cyst, right side: Secondary | ICD-10-CM | POA: Insufficient documentation

## 2015-07-08 NOTE — Assessment & Plan Note (Signed)
Etiology unclear.  Checking thyroid function.   Limit use of sennakot.  Prescribing golytely prep to cathartic followed by Continued daily  use of stool softeners and BFLs

## 2015-07-08 NOTE — Assessment & Plan Note (Signed)
I have addressed  BMI and recommended a low glycemic index diet utilizing smaller more frequent meals to increase metabolism.  I have also recommended that patient start exercising with a goal of 30 minutes of aerobic exercise a minimum of 5 days per week.  

## 2015-07-08 NOTE — Assessment & Plan Note (Signed)
Complex appearing on CT scan done by ER physician in June with no disclosure to patient at that time.  Referral to Gynecology,  And CA 12 5 ordered.

## 2015-07-09 ENCOUNTER — Encounter: Payer: Self-pay | Admitting: Internal Medicine

## 2015-07-09 MED ORDER — PEG 3350-KCL-NA BICARB-NACL 420 G PO SOLR: ORAL | Status: DC

## 2015-07-09 NOTE — Telephone Encounter (Signed)
mychart replied

## 2015-07-24 ENCOUNTER — Other Ambulatory Visit: Payer: Self-pay | Admitting: Internal Medicine

## 2015-07-25 ENCOUNTER — Encounter: Payer: Self-pay | Admitting: Internal Medicine

## 2015-07-26 MED ORDER — LEVONORGEST-ETH ESTRAD 91-DAY 0.15-0.03 &0.01 MG PO TABS: 1.0000 | ORAL_TABLET | Freq: Every day | ORAL | Status: DC

## 2015-08-18 ENCOUNTER — Ambulatory Visit (INDEPENDENT_AMBULATORY_CARE_PROVIDER_SITE_OTHER): Payer: Medicare Other | Admitting: Obstetrics and Gynecology

## 2015-08-18 ENCOUNTER — Encounter: Payer: Self-pay | Admitting: Obstetrics and Gynecology

## 2015-08-18 VITALS — BP 134/94 | HR 105 | Ht 63.0 in | Wt 203.6 lb

## 2015-08-18 DIAGNOSIS — N8320 Unspecified ovarian cysts: Secondary | ICD-10-CM

## 2015-08-18 DIAGNOSIS — N83201 Unspecified ovarian cyst, right side: Secondary | ICD-10-CM

## 2015-08-18 NOTE — Patient Instructions (Addendum)
You will be scheduled for an ultrasound to follow up on right ovarian cyst.  Will call with results    Ovarian Cyst An ovarian cyst is a sac filled with fluid or blood. This sac is attached to the ovary. Some cysts go away on their own. Other cysts need treatment.  HOME CARE   Only take medicine as told by your doctor.  Follow up with your doctor as told.  Get regular pelvic exams and Pap tests. GET HELP IF:  Your periods are late, not regular, or painful.  You stop having periods.  Your belly (abdominal) or pelvic pain does not go away.  Your belly becomes large or puffy (swollen).  You have a hard time peeing (totally emptying your bladder).  You have pressure on your bladder.  You have pain during sex.  You feel fullness, pressure, or discomfort in your belly.  You lose weight for no reason.  You feel sick most of the time.  You have a hard time pooping (constipation).  You do not feel like eating.  You develop pimples (acne).  You have an increase in hair on your body and face.  You are gaining weight for no reason.  You think you are pregnant. GET HELP RIGHT AWAY IF:   Your belly pain gets worse.  You feel sick to your stomach (nauseous), and you throw up (vomit).  You have a fever that comes on fast.  You have belly pain while pooping (bowel movement).  Your periods are heavier than usual. MAKE SURE YOU:   Understand these instructions.  Will watch your condition.  Will get help right away if you are not doing well or get worse. Document Released: 05/15/2008 Document Revised: 09/17/2013 Document Reviewed: 08/04/2013 [facility] Patient Information 2015 [place], [place]. This information is not intended to replace advice given to you by your health care provider. Make sure you discuss any questions you have with your health care provider.

## 2015-08-19 DIAGNOSIS — Z8619 Personal history of other infectious and parasitic diseases: Secondary | ICD-10-CM | POA: Insufficient documentation

## 2015-08-19 NOTE — Progress Notes (Signed)
GYNECOLOGY PROGRESS NOTE  Subjective:    Patient ID: Margaret Zuniga, female    DOB: 1981-06-09, 34 y.o.   MRN: [id number]  HPI  Patient is a 34 y.o. P0 female who presents as a referral from Dr. Derrel Nip for recently diagnosed right ovarian cyst.  Patient notes that she had a CT scan done in June 2016 for right sided abdominal pain and left flank pain.  States that she was never informed of the cyst until recently while reviewing her results in [place].  Asked PCP for a referral to GYN.  Currently denies any symptoms of pain or pelvic discomfort. Denies prior h/o ovarian cysts in the past. Notes regular menses.    Past Medical History  Diagnosis Date  . 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 Shoulder impingement 2009    surgical relesase, Dr. Marry Guan  . Screening for cervical cancer May 2012    , reportedly normal  . 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 Hypertension    Past Surgical History  Procedure Laterality Date  . Leg surgery  1985  . Tonsillectomy  2001  . Eye surgery  1995  . Subacromial decompression  2000    Right shoulder, [place]   Current Outpatient Prescriptions on File Prior to Visit  Medication Sig Dispense Refill  . atenolol (TENORMIN) 25 MG tablet Take 25 mg by mouth daily.  2  . B Complex-C-Folic Acid (MULTIVITAMIN, STRESS FORMULA) tablet Take 1 tablet by mouth daily.      . baclofen (LIORESAL) 10 MG tablet Take 10 mg by mouth 3 (three) times daily.     . bisacodyl (DULCOLAX) 5 MG EC tablet Take 5 mg by mouth daily as needed. For constipation    . cetirizine (ZYRTEC) 10 MG tablet Take 10 mg by mouth daily.    Marland Kitchen docusate sodium (COLACE) 100 MG capsule Take 2 capsules (200 mg total) by mouth 2 (two) times daily. 120 capsule 0  . escitalopram (LEXAPRO) 10 MG tablet Take 1 tablet (10 mg total) by mouth daily.  90 tablet 0  . HYDROcodone-acetaminophen (NORCO) 10-325 MG per tablet Take 1 tablet by mouth every 8 (eight) hours as needed. 30 tablet 0  . Levonorgestrel-Ethinyl Estradiol (CAMRESE) 0.15-0.03 &0.01 MG tablet Take 1 tablet by mouth daily. 91 tablet 0  . Melatonin 5 MG TABS Take by mouth daily.    . polyethylene glycol powder (GLYCOLAX/MIRALAX) powder 2 cap fulls in a full glass of water, three times a day, for 5 days. 255 g 0  . polyethylene glycol-electrolytes (NULYTELY/GOLYTELY) 420 G solution Drink one 8 ounce glass every 30 minutes until liquid stool becomes clear 4000 mL 0  . ranitidine (ZANTAC) 150 MG capsule Take 1 capsule (150 mg total) by mouth 2 (two) times daily. 28 capsule 0  . senna (SENOKOT) 8.6 MG TABS tablet Take 2 tablets (17.2 mg total) by mouth 2 (two) times daily. 120 each 0  . verapamil (VERELAN PM) 120 MG 24 hr capsule Take 1 capsule by mouth daily. To start when  finishes the atenolol  1   No current facility-administered medications on file prior to visit.    Allergies  Allergen Reactions  . Zonegran [Zonisamide] Rash     Review of Systems A comprehensive review of systems was negative.  Objective:   Blood pressure 134/94, pulse 105, height 5\' 3"  (1.6 m), weight 203 lb 9.6 oz (92.352 kg), last menstrual period 08/18/2015. General appearance: alert and no distress Abdomen: soft, non-tender; bowel sounds normal; no masses,  no organomegaly Pelvic: deferred   Labs:  Lab Results  Component Value Date   TSH 2.260 07/06/2015   T4TOTAL 9.1 07/06/2015   Lab Results  Component Value Date   CA125 9 07/06/2015    Imaging:  06/01/2015 Abdominal and Pelvis CT FINDINGS: Lung bases are clear without infiltrate or effusion. Small hiatal hernia.  Liver gallbladder and bile ducts are normal. Pancreas and spleen are normal.  Kidneys are normal. No renal mass or obstruction. No renal calculi. Urinary bladder normal. Uterus is normal. 4 cm right ovary probably  within normal limits. Possible complex cyst in the right ovary not well-defined on this study. No free fluid.  Constipation with stool throughout the colon. No bowel obstruction or bowel edema. Normal appendix.  Negative for mass or adenopathy. Negative lumbar spine.  Assessment:   Right ovarian complex cyst  Plan:   Discussion had with patient regarding ovarian cyst.  Currently asymptomatic. Discussed likely benign nature of cyst based on patient's age, size, and recently obtained CA-125. Informed that most cysts resolve on their own, or with hormonal treatments (i.e OCPs).  Will order pelvic sono to assess if cyst has resolved.  If not, can begin a short trial of OCPs for treatment.  RTC in 1-2 weeks for ultrasound.     Rubie Maid, MD [facility]

## 2015-08-31 ENCOUNTER — Ambulatory Visit: Payer: Medicare Other

## 2015-08-31 DIAGNOSIS — N8320 Unspecified ovarian cysts: Secondary | ICD-10-CM

## 2015-08-31 DIAGNOSIS — N83201 Unspecified ovarian cyst, right side: Secondary | ICD-10-CM

## 2015-09-04 ENCOUNTER — Other Ambulatory Visit: Payer: Self-pay | Admitting: Internal Medicine

## 2015-09-07 ENCOUNTER — Telehealth: Payer: Self-pay | Admitting: Obstetrics and Gynecology

## 2015-09-07 NOTE — Telephone Encounter (Signed)
Please inform patient that ultrasound was normal, appears that right ovarian cyst has resolved.

## 2015-09-07 NOTE — Telephone Encounter (Signed)
Patient called requesting her ultrasound results. She can be reached at [phone].Thanks

## 2015-09-08 NOTE — Telephone Encounter (Signed)
Pt aware.

## 2015-09-14 ENCOUNTER — Encounter: Payer: Self-pay | Admitting: Internal Medicine

## 2015-10-14 ENCOUNTER — Encounter: Payer: Self-pay | Admitting: Obstetrics and Gynecology

## 2015-10-14 ENCOUNTER — Ambulatory Visit (INDEPENDENT_AMBULATORY_CARE_PROVIDER_SITE_OTHER): Payer: Medicare Other | Admitting: Obstetrics and Gynecology

## 2015-10-14 VITALS — BP 141/97 | HR 106 | Ht 63.0 in | Wt 205.7 lb

## 2015-10-14 DIAGNOSIS — Z01419 Encounter for gynecological examination (general) (routine) without abnormal findings: Secondary | ICD-10-CM | POA: Diagnosis not present

## 2015-10-14 DIAGNOSIS — I10 Essential (primary) hypertension: Secondary | ICD-10-CM | POA: Diagnosis not present

## 2015-10-14 MED ORDER — LEVONORGEST-ETH ESTRAD 91-DAY 0.15-0.03 &0.01 MG PO TABS: 1.0000 | ORAL_TABLET | Freq: Every day | ORAL | Status: DC

## 2015-10-14 NOTE — Patient Instructions (Signed)
Health Maintenance, Female Adopting a healthy lifestyle and getting preventive care can go a long way to promote health and wellness. Talk with your health care provider about what schedule of regular examinations is right for you. This is a good chance for you to check in with your provider about disease prevention and staying healthy. In between checkups, there are plenty of things you can do on your own. Experts have done a lot of research about which lifestyle changes and preventive measures are most likely to keep you healthy. Ask your health care provider for more information. WEIGHT AND DIET  Eat a healthy diet  Be sure to include plenty of vegetables, fruits, low-fat dairy products, and lean protein.  Do not eat a lot of foods high in solid fats, added sugars, or salt.  Get regular exercise. This is one of the most important things you can do for your health.  Most adults should exercise for at least 150 minutes each week. The exercise should increase your heart rate and make you sweat (moderate-intensity exercise).  Most adults should also do strengthening exercises at least twice a week. This is in addition to the moderate-intensity exercise.  Maintain a healthy weight  Body mass index (BMI) is a measurement that can be used to identify possible weight problems. It estimates body fat based on height and weight. Your health care provider can help determine your BMI and help you achieve or maintain a healthy weight.  For females 20 years of age and older:   A BMI below 18.5 is considered underweight.  A BMI of 18.5 to 24.9 is normal.  A BMI of 25 to 29.9 is considered overweight.  A BMI of 30 and above is considered obese.  Watch levels of cholesterol and blood lipids  You should start having your blood tested for lipids and cholesterol at 36 years of age, then have this test every 5 years.  You may need to have your cholesterol levels checked more often if:  Your lipid  or cholesterol levels are high.  You are older than 36 years of age.  You are at high risk for heart disease.  CANCER SCREENING   Lung Cancer  Lung cancer screening is recommended for adults 55-80 years old who are at high risk for lung cancer because of a history of smoking.  A yearly low-dose CT scan of the lungs is recommended for people who:  Currently smoke.  Have quit within the past 15 years.  Have at least a 30-pack-year history of smoking. A pack year is smoking an average of one pack of cigarettes a day for 1 year.  Yearly screening should continue until it has been 15 years since you quit.  Yearly screening should stop if you develop a health problem that would prevent you from having lung cancer treatment.  Breast Cancer  Practice breast self-awareness. This means understanding how your breasts normally appear and feel.  It also means doing regular breast self-exams. Let your health care provider know about any changes, no matter how small.  If you are in your 20s or 30s, you should have a clinical breast exam (CBE) by a health care provider every 1-3 years as part of a regular health exam.  If you are 40 or older, have a CBE every year. Also consider having a breast X-ray (mammogram) every year.  If you have a family history of breast cancer, talk to your health care provider about genetic screening.  If you   are at high risk for breast cancer, talk to your health care provider about having an MRI and a mammogram every year.  Breast cancer gene (BRCA) assessment is recommended for women who have family members with BRCA-related cancers. BRCA-related cancers include:  Breast.  Ovarian.  Tubal.  Peritoneal cancers.  Results of the assessment will determine the need for genetic counseling and BRCA1 and BRCA2 testing. Cervical Cancer Your health care provider may recommend that you be screened regularly for cancer of the pelvic organs (ovaries, uterus, and  vagina). This screening involves a pelvic examination, including checking for microscopic changes to the surface of your cervix (Pap test). You may be encouraged to have this screening done every 3 years, beginning at age 21.  For women ages 30-65, health care providers may recommend pelvic exams and Pap testing every 3 years, or they may recommend the Pap and pelvic exam, combined with testing for human papilloma virus (HPV), every 5 years. Some types of HPV increase your risk of cervical cancer. Testing for HPV may also be done on women of any age with unclear Pap test results.  Other health care providers may not recommend any screening for nonpregnant women who are considered low risk for pelvic cancer and who do not have symptoms. Ask your health care provider if a screening pelvic exam is right for you.  If you have had past treatment for cervical cancer or a condition that could lead to cancer, you need Pap tests and screening for cancer for at least 20 years after your treatment. If Pap tests have been discontinued, your risk factors (such as having a new sexual partner) need to be reassessed to determine if screening should resume. Some women have medical problems that increase the chance of getting cervical cancer. In these cases, your health care provider may recommend more frequent screening and Pap tests. Colorectal Cancer  This type of cancer can be detected and often prevented.  Routine colorectal cancer screening usually begins at 36 years of age and continues through 36 years of age.  Your health care provider may recommend screening at an earlier age if you have risk factors for colon cancer.  Your health care provider may also recommend using home test kits to check for hidden blood in the stool.  A small camera at the end of a tube can be used to examine your colon directly (sigmoidoscopy or colonoscopy). This is done to check for the earliest forms of colorectal  cancer.  Routine screening usually begins at age 50.  Direct examination of the colon should be repeated every 5-10 years through 36 years of age. However, you may need to be screened more often if early forms of precancerous polyps or small growths are found. Skin Cancer  Check your skin from head to toe regularly.  Tell your health care provider about any new moles or changes in moles, especially if there is a change in a mole's shape or color.  Also tell your health care provider if you have a mole that is larger than the size of a pencil eraser.  Always use sunscreen. Apply sunscreen liberally and repeatedly throughout the day.  Protect yourself by wearing long sleeves, pants, a wide-brimmed hat, and sunglasses whenever you are outside. HEART DISEASE, DIABETES, AND HIGH BLOOD PRESSURE   High blood pressure causes heart disease and increases the risk of stroke. High blood pressure is more likely to develop in:  People who have blood pressure in the high end   of the normal range (130-139/85-89 mm Hg).  People who are overweight or obese.  People who are African American.  If you are 38-23 years of age, have your blood pressure checked every 3-5 years. If you are 61 years of age or older, have your blood pressure checked every year. You should have your blood pressure measured twice--once when you are at a hospital or clinic, and once when you are not at a hospital or clinic. Record the average of the two measurements. To check your blood pressure when you are not at a hospital or clinic, you can use:  An automated blood pressure machine at a pharmacy.  A home blood pressure monitor.  If you are between 45 years and 39 years old, ask your health care provider if you should take aspirin to prevent strokes.  Have regular diabetes screenings. This involves taking a blood sample to check your fasting blood sugar level.  If you are at a normal weight and have a low risk for diabetes,  have this test once every three years after 36 years of age.  If you are overweight and have a high risk for diabetes, consider being tested at a younger age or more often. PREVENTING INFECTION  Hepatitis B  If you have a higher risk for hepatitis B, you should be screened for this virus. You are considered at high risk for hepatitis B if:  You were born in a country where hepatitis B is common. Ask your health care provider which countries are considered high risk.  Your parents were born in a high-risk country, and you have not been immunized against hepatitis B (hepatitis B vaccine).  You have HIV or AIDS.  You use needles to inject street drugs.  You live with someone who has hepatitis B.  You have had sex with someone who has hepatitis B.  You get hemodialysis treatment.  You take certain medicines for conditions, including cancer, organ transplantation, and autoimmune conditions. Hepatitis C  Blood testing is recommended for:  Everyone born from [birth year] through 1967.  Anyone with known risk factors for hepatitis C. Sexually transmitted infections (STIs)  You should be screened for sexually transmitted infections (STIs) including gonorrhea and chlamydia if:  You are sexually active and are younger than 36 years of age.  You are older than 36 years of age and your health care provider tells you that you are at risk for this type of infection.  Your sexual activity has changed since you were last screened and you are at an increased risk for chlamydia or gonorrhea. Ask your health care provider if you are at risk.  If you do not have HIV, but are at risk, it may be recommended that you take a prescription medicine daily to prevent HIV infection. This is called pre-exposure prophylaxis (PrEP). You are considered at risk if:  You are sexually active and do not regularly use condoms or know the HIV status of your partner(s).  You take drugs by injection.  You are sexually  active with a partner who has HIV. Talk with your health care provider about whether you are at high risk of being infected with HIV. If you choose to begin PrEP, you should first be tested for HIV. You should then be tested every 3 months for as long as you are taking PrEP.  PREGNANCY   If you are premenopausal and you may become pregnant, ask your health care provider about preconception counseling.  If you may  become pregnant, take 400 to 800 micrograms (mcg) of folic acid every day.  If you want to prevent pregnancy, talk to your health care provider about birth control (contraception). OSTEOPOROSIS AND MENOPAUSE   Osteoporosis is a disease in which the bones lose minerals and strength with aging. This can result in serious bone fractures. Your risk for osteoporosis can be identified using a bone density scan.  If you are 61 years of age or older, or if you are at risk for osteoporosis and fractures, ask your health care provider if you should be screened.  Ask your health care provider whether you should take a calcium or vitamin D supplement to lower your risk for osteoporosis.  Menopause may have certain physical symptoms and risks.  Hormone replacement therapy may reduce some of these symptoms and risks. Talk to your health care provider about whether hormone replacement therapy is right for you.  HOME CARE INSTRUCTIONS   Schedule regular health, dental, and eye exams.  Stay current with your immunizations.   Do not use any tobacco products including cigarettes, chewing tobacco, or electronic cigarettes.  If you are pregnant, do not drink alcohol.  If you are breastfeeding, limit how much and how often you drink alcohol.  Limit alcohol intake to no more than 1 drink per day for nonpregnant women. One drink equals 12 ounces of beer, 5 ounces of wine, or 1 ounces of hard liquor.  Do not use street drugs.  Do not share needles.  Ask your health care provider for help if  you need support or information about quitting drugs.  Tell your health care provider if you often feel depressed.  Tell your health care provider if you have ever been abused or do not feel safe at home.   This information is not intended to replace advice given to you by your health care provider. Make sure you discuss any questions you have with your health care provider.   Document Released: 06/12/2011 Document Revised: 12/18/2014 Document Reviewed: 10/29/2013 Elsevier Interactive Patient Education [employer].

## 2015-10-15 ENCOUNTER — Encounter: Payer: Self-pay | Admitting: Obstetrics and Gynecology

## 2015-10-15 NOTE — Progress Notes (Signed)
GYNECOLOGY ANNUAL EXAM PROGRESS NOTE  Subjective:    Margaret Zuniga is a 34 y.o. P0 female who presents for an annual exam. The patient has no complaints today. The patient is sexually active. GYN screening history: last pap: approximate date 07/2014 and was normal and last mammogram: approximate date 05/2014 and was normal. The patient wears seatbelts: yes. The patient participates in regular exercise: no. Has the patient ever been transfused or tattooed?: no. The patient reports that there is not domestic violence in her life.   Menstrual History: OB History    Gravida Para Term Preterm AB TAB SAB Ectopic Multiple Living   0 0 0 0 0 0 0 0 0 0       Menarche age: 82 Patient's last menstrual period was 09/27/2015 (approximate).  Denies h/o STIs or abnormal pap smears.    Past Medical History  Diagnosis Date  . 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 Shoulder impingement 2009    surgical relesase, Dr. Marry Guan  . Screening for cervical cancer May 2012    , reportedly normal  . 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 Hypertension     Family History  Problem Relation Age of Onset  . Diabetes Mother   . Coronary artery disease Mother   . Hyperlipidemia Mother   . Hypertension Mother   . Heart disease Maternal Grandfather     Past Surgical History  Procedure Laterality Date  . Leg surgery  1985  . Tonsillectomy  2001  . Eye surgery  1995  . Subacromial decompression  2000    Right shoulder, [place]    Social History   Social History  . Marital Status: Single    Spouse Name: N/A  . Number of Children: N/A  . Years of Education: N/A   Occupational History  . Not on file.   Social History Main Topics  . Smoking status: Never Smoker   . Smokeless tobacco: Never Used  . Alcohol Use: No  . Drug Use: No   . Sexual Activity: No   Other Topics Concern  . Not on file   Social History Narrative    Current Outpatient Prescriptions on File Prior to Visit  Medication Sig Dispense Refill  . atenolol (TENORMIN) 25 MG tablet Take 25 mg by mouth daily.  2  . B Complex-C-Folic Acid (MULTIVITAMIN, STRESS FORMULA) tablet Take 1 tablet by mouth daily.      . baclofen (LIORESAL) 10 MG tablet Take 10 mg by mouth 3 (three) times daily.     . bisacodyl (DULCOLAX) 5 MG EC tablet Take 5 mg by mouth daily as needed. For constipation    . cetirizine (ZYRTEC) 10 MG tablet Take 10 mg by mouth daily.    Marland Kitchen docusate sodium (COLACE) 100 MG capsule Take 2 capsules (200 mg total) by mouth 2 (two) times daily. 120 capsule 0  . escitalopram (LEXAPRO) 10 MG tablet TAKE 1 TABLET (10 MG TOTAL) BY MOUTH DAILY. 90 tablet 1  . HYDROcodone-acetaminophen (NORCO) 10-325 MG per tablet Take 1 tablet by mouth every 8 (eight) hours as needed. 30 tablet 0  . Melatonin 5 MG TABS Take by mouth daily.    Marland Kitchen PEG 3350-KCl-NaBcb-NaCl-NaSulf (PEG-3350/ELECTROLYTES) 236 G SOLR DRINK ONE 8 OUNCE GLASS EVERY 30 MINUTES UNTIL LIQUID  STOOL BECOMES CLEAR  0  . polyethylene glycol-electrolytes (NULYTELY/GOLYTELY) 420 G solution Drink one 8 ounce glass every 30 minutes until liquid stool becomes clear 4000 mL 0  . ranitidine (ZANTAC) 150 MG capsule Take 1 capsule (150 mg total) by mouth 2 (two) times daily. 28 capsule 0  . senna (SENOKOT) 8.6 MG TABS tablet Take 2 tablets (17.2 mg total) by mouth 2 (two) times daily. 120 each 0   No current facility-administered medications on file prior to visit.    Allergies  Allergen Reactions  . Zonegran [Zonisamide] Rash    Review of Systems Constitutional: negative for chills, fatigue, fevers and sweats Eyes: negative for irritation, redness and visual disturbance Ears, nose, mouth, throat, and face: negative for hearing loss, nasal congestion, snoring and tinnitus Respiratory: negative for asthma,  cough, sputum Cardiovascular: negative for chest pain, dyspnea, exertional chest pressure/discomfort, irregular heart beat, palpitations and syncope Gastrointestinal: negative for abdominal pain, change in bowel habits, nausea and vomiting Genitourinary: negative for abnormal menstrual periods, genital lesions, sexual problems and vaginal discharge, dysuria and urinary incontinence Integument/breast: negative for breast lump, breast tenderness and nipple discharge Hematologic/lymphatic: negative for bleeding and easy bruising Musculoskeletal:negative for back pain and muscle weakness Neurological: positive for headaches; negative for dizziness, vertigo and weakness Endocrine: negative for diabetic symptoms including polydipsia, polyuria and skin dryness Allergic/Immunologic: negative for hay fever and urticaria    Objective:    BP 141/97 mmHg  Pulse 106  Ht 5\' 3"  (1.6 m)  Wt 205 lb 11.2 oz (93.305 kg)  BMI 36.45 kg/m2  General Appearance:    Alert, cooperative, no distress, appears stated age  Head:    Normocephalic, without obvious abnormality, atraumatic  Eyes:    PERRL, conjunctiva/corneas clear, EOM's intact, both eyes  Ears:    Normal TM's and external ear canals, both ears  Nose:   Nares normal, septum midline, mucosa normal, no drainage or sinus tenderness  Throat:   Lips, mucosa, and tongue normal; teeth and gums normal  Neck:   Supple, symmetrical, trachea midline, no adenopathy;    thyroid:  no enlargement/tenderness/nodules; no carotid bruit or JVD  Back:     Symmetric, no curvature, ROM normal, no CVA tenderness  Lungs:     Clear to auscultation bilaterally, respirations unlabored  Chest Wall:    No tenderness or deformity   Heart:    Regular rate and rhythm, S1 and S2 normal, no murmur, rub or gallop  Breast Exam:    No tenderness, masses, or nipple abnormality  Abdomen:     Soft, non-tender, bowel sounds active all four quadrants, no masses, no organomegaly  Genitalia:     Normal female external genitalia without lesion, rectovaginal septum normal. Vagina without discharge, cervix with no lesions or CMT. Uterus normal size, shape, mobile.  Adnexae without masses or tenderness  Rectal:    Normal external sphincter, no masses.  Internal exam not done.  Extremities:   Extremities normal, atraumatic, no cyanosis or edema  Pulses:   2+ and symmetric all extremities  Skin:   Skin color, texture, turgor normal, no rashes or lesions  Lymph nodes:   Cervical, supraclavicular, and axillary nodes normal  Neurologic:   CNII-XII intact, normal strength, sensation and reflexes    throughout    Labs:   Office Visit on 07/06/2015  Component Date Value Ref Range Status  . TSH 07/06/2015 2.260  0.450 - 4.500 uIU/mL Final  . T4, Total 07/06/2015 9.1  4.5 - 12.0 ug/dL Final  Lab Results  Component Value Date   WBC 6.8 06/01/2015   HGB 13.8 06/01/2015   HCT 42.1 06/01/2015   MCV 88.8 06/01/2015   PLT 284 06/01/2015     Chemistry      Component Value Date/Time   NA 140 07/01/2015 1131   K 3.6 07/01/2015 1131   CL 106 07/01/2015 1131   CO2 23 07/01/2015 1131   CO2 26 06/23/2013 1307   BUN 12 07/01/2015 1131   CREATININE 0.84 07/01/2015 1131      Component Value Date/Time   CALCIUM 9.6 07/01/2015 1131   ALKPHOS 59 07/01/2015 1131   AST 32 07/01/2015 1131   ALT 28 07/01/2015 1131   BILITOT 0.5 07/01/2015 1131      Lab Results  Component Value Date   CHOL 174 12/18/2012   HDL 52.00 12/18/2012   LDLCALC 95 12/18/2012   TRIG 135.0 12/18/2012   CHOLHDL 3 12/18/2012     Assessment:   Healthy female exam.   Hypertension   Plan:     Breast self exam technique reviewed and patient encouraged to perform self-exam monthly. Contraception: OCP (estrogen/progesterone).  Desires refill.  Will order. Discussed healthy lifestyle modifications. Mammograms to begin again at age 66.  Up to date on flu vaccine.  Pap smear up to date.  Declines STI testing.   Patient notes compliance with HTN meds, mildly elevated today.  Continue to monitor.  Follow up in 1 year or as needed     Rubie Maid, MD [facility] 10/15/2015 11:30 PM

## 2016-02-14 DIAGNOSIS — G43719 Chronic migraine without aura, intractable, without status migrainosus: Secondary | ICD-10-CM | POA: Diagnosis not present

## 2016-02-14 DIAGNOSIS — M791 Myalgia: Secondary | ICD-10-CM | POA: Diagnosis not present

## 2016-02-14 DIAGNOSIS — R51 Headache: Secondary | ICD-10-CM | POA: Diagnosis not present

## 2016-02-14 DIAGNOSIS — M542 Cervicalgia: Secondary | ICD-10-CM | POA: Diagnosis not present

## 2016-03-05 ENCOUNTER — Other Ambulatory Visit: Payer: Self-pay | Admitting: Internal Medicine

## 2016-03-27 ENCOUNTER — Encounter: Payer: Self-pay | Admitting: Internal Medicine

## 2016-03-28 ENCOUNTER — Other Ambulatory Visit: Payer: Self-pay | Admitting: Internal Medicine

## 2016-03-28 MED ORDER — METOPROLOL SUCCINATE ER 50 MG PO TB24: 50.0000 mg | ORAL_TABLET | Freq: Every day | ORAL | Status: DC

## 2016-04-03 ENCOUNTER — Encounter: Payer: Self-pay | Admitting: Internal Medicine

## 2016-04-03 ENCOUNTER — Ambulatory Visit (INDEPENDENT_AMBULATORY_CARE_PROVIDER_SITE_OTHER): Payer: Medicare Other | Admitting: Internal Medicine

## 2016-04-03 VITALS — BP 140/98 | HR 93 | Temp 98.1°F | Resp 12 | Ht 63.0 in | Wt 203.0 lb

## 2016-04-03 DIAGNOSIS — R5383 Other fatigue: Secondary | ICD-10-CM | POA: Diagnosis not present

## 2016-04-03 DIAGNOSIS — E559 Vitamin D deficiency, unspecified: Secondary | ICD-10-CM | POA: Diagnosis not present

## 2016-04-03 DIAGNOSIS — B372 Candidiasis of skin and nail: Secondary | ICD-10-CM

## 2016-04-03 DIAGNOSIS — I10 Essential (primary) hypertension: Secondary | ICD-10-CM | POA: Diagnosis not present

## 2016-04-03 DIAGNOSIS — R7301 Impaired fasting glucose: Secondary | ICD-10-CM | POA: Diagnosis not present

## 2016-04-03 MED ORDER — NYSTATIN 100000 UNIT/GM EX OINT: 1.0000 "application " | TOPICAL_OINTMENT | Freq: Two times a day (BID) | CUTANEOUS | Status: DC

## 2016-04-03 NOTE — Progress Notes (Signed)
Pre-visit discussion using our clinic review tool. No additional management support is needed unless otherwise documented below in the visit note.  

## 2016-04-03 NOTE — Patient Instructions (Addendum)
Resume the Toprol XL 50 mg once daily ,  If BP is still > 130/80 , you can increase dose to 100 mg   We are going to treat your foot for candidiasis  (yeast infection) with Nystatin ointment.  Use it twice daily  Once it clears up,  use [employer] Medicated powder   Return for fasting labs at your leisure.

## 2016-04-04 DIAGNOSIS — B372 Candidiasis of skin and nail: Secondary | ICD-10-CM | POA: Insufficient documentation

## 2016-04-04 DIAGNOSIS — I10 Essential (primary) hypertension: Secondary | ICD-10-CM

## 2016-04-04 HISTORY — DX: Essential (primary) hypertension: I10

## 2016-04-04 NOTE — Assessment & Plan Note (Addendum)
Trial of nystatin bid, checking A1c to rule out new onset DM given obesity,  Infection and FH  No results found for: HGBA1C

## 2016-04-04 NOTE — Assessment & Plan Note (Signed)
Candesartan stopped,  Resume Toprol XL starting at 50 mg daily.  Increase to 100 mg after one week i f not at goal.

## 2016-04-04 NOTE — Progress Notes (Signed)
Subjective:  Patient ID: Margaret Zuniga, female    DOB: 1981-11-08  Age: 35 y.o. MRN: [id number]  CC: The primary encounter diagnosis was Essential hypertension, benign. Diagnoses of Vitamin D deficiency, Other fatigue, Impaired fasting glucose, and Candidiasis of skin were also pertinent to this visit.  HPI Margaret Zuniga presents for follow up on hypertension and new problem of foot pain  1) Hypertension: previously treated with Toprol  100 mg daily. Medication was discontinued by headache specialist during treatment trials of atenolol and verapamil for chronic headache and candesartan was prescribed by specialist , but twice daily dosing was not covered by insurance.  Patient requesting  That I once again take over managing her BP and wants to resume Toprol XL.   1) Foot pain, right foot.  Started with itch red papular rash on top of foot near base of toes.  Used an OTC steroid cream and a heavy moisturizer on foot  With no improvement.  Now the skin of the middle toe has become white and  cracked on the plantar surface and very painful.  Denies walking barefoot in any publiplace or gym.  Does not recall feet being soaked for any period of time.   Outpatient Prescriptions Prior to Visit  Medication Sig Dispense Refill  . B Complex-C-Folic Acid (MULTIVITAMIN, STRESS FORMULA) tablet Take 1 tablet by mouth daily.      . baclofen (LIORESAL) 10 MG tablet Take 10 mg by mouth 3 (three) times daily.     . bisacodyl (DULCOLAX) 5 MG EC tablet Take 5 mg by mouth daily as needed. For constipation    . BOOSTRIX 5-2.5-18.5 injection TO BE ADMINISTERED BY PHARMACIST FOR IMMUNIZATION  0  . cetirizine (ZYRTEC) 10 MG tablet Take 10 mg by mouth daily.    Marland Kitchen docusate sodium (COLACE) 100 MG capsule Take 2 capsules (200 mg total) by mouth 2 (two) times daily. 120 capsule 0  . escitalopram (LEXAPRO) 10 MG tablet TAKE 1 TABLET (10 MG TOTAL) BY MOUTH DAILY. 90 tablet 1  . Levonorgestrel-Ethinyl Estradiol (CAMRESE) 0.15-0.03  &0.01 MG tablet Take 1 tablet by mouth daily. 91 tablet 3  . Melatonin 5 MG TABS Take by mouth daily.    . metoprolol succinate (TOPROL-XL) 50 MG 24 hr tablet Take 1 tablet (50 mg total) by mouth daily. Take with or immediately following a meal. 90 tablet 3  . PEG 3350-KCl-NaBcb-NaCl-NaSulf (PEG-3350/ELECTROLYTES) 236 G SOLR DRINK ONE 8 OUNCE GLASS EVERY 30 MINUTES UNTIL LIQUID STOOL BECOMES CLEAR  0  . senna (SENOKOT) 8.6 MG TABS tablet Take 2 tablets (17.2 mg total) by mouth 2 (two) times daily. 120 each 0  . FLUARIX QUADRIVALENT 0.5 ML injection TO BE ADMINISTERED BY PHARMACIST FOR IMMUNIZATION  0  . atenolol (TENORMIN) 25 MG tablet Take 25 mg by mouth daily. Reported on 04/03/2016  2  . HYDROcodone-acetaminophen (NORCO) 10-325 MG per tablet Take 1 tablet by mouth every 8 (eight) hours as needed. (Patient not taking: Reported on 04/03/2016) 30 tablet 0  . polyethylene glycol-electrolytes (NULYTELY/GOLYTELY) 420 G solution Drink one 8 ounce glass every 30 minutes until liquid stool becomes clear (Patient not taking: Reported on 04/03/2016) 4000 mL 0  . ranitidine (ZANTAC) 150 MG capsule Take 1 capsule (150 mg total) by mouth 2 (two) times daily. (Patient not taking: Reported on 04/03/2016) 28 capsule 0  . verapamil (CALAN-SR) 240 MG CR tablet Reported on 04/03/2016  2   No facility-administered medications prior to visit.  Review of Systems;  Patient denies  fevers, malaise, unintentional weight loss, skin rash, eye pain, sinus congestion and sinus pain, sore throat, dysphagia,  hemoptysis , cough, dyspnea, wheezing, chest pain, palpitations, orthopnea, edema, abdominal pain, nausea, melena, diarrhea, constipation, flank pain, dysuria, hematuria, urinary  Frequency, nocturia, numbness, tingling, seizures,  Focal weakness, Loss of consciousness,  Tremor, insomnia, depression, anxiety, and suicidal ideation.      Objective:  BP 140/98 mmHg  Pulse 93  Temp(Src) 98.1 F (36.7 C) (Oral)  Resp 12   Ht 5\' 3"  (1.6 m)  Wt 203 lb (92.08 kg)  BMI 35.97 kg/m2  SpO2 98%  LMP 02/10/2016 (Approximate)  BP Readings from Last 3 Encounters:  04/03/16 140/98  10/14/15 141/97  08/18/15 134/94    Wt Readings from Last 3 Encounters:  04/03/16 203 lb (92.08 kg)  10/14/15 205 lb 11.2 oz (93.305 kg)  08/18/15 203 lb 9.6 oz (92.352 kg)    General appearance: alert, cooperative and appears stated age. Prior head trauma (remote) noted  Ears: normal TM's and external ear canals both ears Throat: lips, mucosa, and tongue normal; teeth and gums normal Neck: no adenopathy, no carotid bruit, supple, symmetrical, trachea midline and thyroid not enlarged, symmetric, no tenderness/mass/nodules Back: symmetric, no curvature. ROM normal. No CVA tenderness. Lungs: clear to auscultation bilaterally Heart: regular rate and rhythm, S1, S2 normal, no murmur, click, rub or gallop Abdomen: soft, non-tender; bowel sounds normal; no masses,  no organomegaly Pulses: 2+ and symmetric Skin: maceration of 3rd toe plantar surface.  Papular rash at base of toes.  Lymph nodes: Cervical, supraclavicular, and axillary nodes normal.  No results found for: HGBA1C  Lab Results  Component Value Date   CREATININE 0.84 07/01/2015   CREATININE 0.70 06/01/2015   CREATININE 0.8 04/20/2014    Lab Results  Component Value Date   WBC 6.8 06/01/2015   HGB 13.8 06/01/2015   HCT 42.1 06/01/2015   PLT 284 06/01/2015   GLUCOSE 131* 07/01/2015   CHOL 174 12/18/2012   TRIG 135.0 12/18/2012   HDL 52.00 12/18/2012   LDLCALC 95 12/18/2012   ALT 28 07/01/2015   AST 32 07/01/2015   NA 140 07/01/2015   K 3.6 07/01/2015   CL 106 07/01/2015   CREATININE 0.84 07/01/2015   BUN 12 07/01/2015   CO2 23 07/01/2015   TSH 2.260 07/06/2015    US Abdomen Limited Ruq  07/01/2015  CLINICAL DATA:  One week history of right upper quadrant pain EXAM: US ABDOMEN LIMITED - RIGHT UPPER QUADRANT COMPARISON:  None. FINDINGS: Gallbladder: No  gallstones or wall thickening visualized. There is no pericholecystic fluid. No sonographic Murphy sign noted. Common bile duct: Diameter: 5 mm. There is no intrahepatic or extrahepatic biliary duct dilatation. Liver: No focal lesion identified. Within normal limits in parenchymal echogenicity. IMPRESSION: Study within normal limits. Electronically Signed   By: Lowella Grip III M.D.   On: 07/01/2015 15:59    Assessment & Plan:   Problem List Items Addressed This Visit    Candidiasis of skin    Trial of nystatin bid, checking A1c to rule out new onset DM given obesity,  Infection and FH  No results found for: HGBA1C       Relevant Medications   nystatin ointment (MYCOSTATIN)   Essential hypertension, benign - Primary    Candesartan stopped,  Resume Toprol XL starting at 50 mg daily.  Increase to 100 mg after one week i f not at goal.  Relevant Orders   Comprehensive metabolic panel   LDL cholesterol, direct   Lipid panel   Microalbumin / creatinine urine ratio    Other Visit Diagnoses    Vitamin D deficiency        Relevant Orders    VITAMIN D 25 Hydroxy (Vit-D Deficiency, Fractures)    Other fatigue        Relevant Orders    TSH    CBC with Differential/Platelet    Impaired fasting glucose        Relevant Orders    Hemoglobin A1c       I have discontinued Ms. Grunden's FLUARIX QUADRIVALENT. I am also having her start on nystatin ointment. Additionally, I am having her maintain her (multivitamin, stress formula), bisacodyl, cetirizine, baclofen, Melatonin, atenolol, HYDROcodone-acetaminophen, docusate sodium, senna, ranitidine, polyethylene glycol-electrolytes, PEG-3350/Electrolytes, verapamil, BOOSTRIX, Levonorgestrel-Ethinyl Estradiol, escitalopram, and metoprolol succinate.  Meds ordered this encounter  Medications  . nystatin ointment (MYCOSTATIN)    Sig: Apply 1 application topically 2 (two) times daily. To affected area on foot    Dispense:  30 g    Refill:  1     Medications Discontinued During This Encounter  Medication Reason  . FLUARIX QUADRIVALENT 0.5 ML injection Error    Follow-up: No Follow-up on file.   Crecencio Mc, MD

## 2016-04-10 DIAGNOSIS — M542 Cervicalgia: Secondary | ICD-10-CM | POA: Diagnosis not present

## 2016-04-10 DIAGNOSIS — R51 Headache: Secondary | ICD-10-CM | POA: Diagnosis not present

## 2016-04-10 DIAGNOSIS — M791 Myalgia: Secondary | ICD-10-CM | POA: Diagnosis not present

## 2016-04-10 DIAGNOSIS — G43719 Chronic migraine without aura, intractable, without status migrainosus: Secondary | ICD-10-CM | POA: Diagnosis not present

## 2016-04-14 ENCOUNTER — Encounter: Payer: Self-pay | Admitting: Obstetrics and Gynecology

## 2016-04-14 ENCOUNTER — Other Ambulatory Visit: Payer: Medicare Other

## 2016-04-17 ENCOUNTER — Other Ambulatory Visit (INDEPENDENT_AMBULATORY_CARE_PROVIDER_SITE_OTHER): Payer: Medicare Other

## 2016-04-17 DIAGNOSIS — E559 Vitamin D deficiency, unspecified: Secondary | ICD-10-CM | POA: Diagnosis not present

## 2016-04-17 DIAGNOSIS — I10 Essential (primary) hypertension: Secondary | ICD-10-CM

## 2016-04-17 DIAGNOSIS — R7301 Impaired fasting glucose: Secondary | ICD-10-CM | POA: Diagnosis not present

## 2016-04-17 DIAGNOSIS — R5383 Other fatigue: Secondary | ICD-10-CM | POA: Diagnosis not present

## 2016-04-17 LAB — LIPID PANEL
Cholesterol: 169 mg/dL (ref 0–200)
HDL: 53.9 mg/dL (ref >39.00)
LDL Cholesterol: 93 mg/dL (ref 0–99)
NonHDL: 115.26
Total CHOL/HDL Ratio: 3
Triglycerides: 110 mg/dL (ref 0.0–149.0)
VLDL: 22 mg/dL (ref 0.0–40.0)

## 2016-04-17 LAB — CBC WITH DIFFERENTIAL/PLATELET
Basophils Absolute: 0 10*3/uL (ref 0.0–0.1)
Basophils Relative: 0.4 % (ref 0.0–3.0)
Eosinophils Absolute: 0.1 10*3/uL (ref 0.0–0.7)
Eosinophils Relative: 2.1 % (ref 0.0–5.0)
HCT: 38.4 % (ref 36.0–46.0)
Hemoglobin: 12.9 g/dL (ref 12.0–15.0)
Lymphocytes Relative: 24.2 % (ref 12.0–46.0)
Lymphs Abs: 1.5 10*3/uL (ref 0.7–4.0)
MCHC: 33.5 g/dL (ref 30.0–36.0)
MCV: 88.1 fl (ref 78.0–100.0)
Monocytes Absolute: 0.3 10*3/uL (ref 0.1–1.0)
Monocytes Relative: 4.9 % (ref 3.0–12.0)
Neutro Abs: 4.2 10*3/uL (ref 1.4–7.7)
Neutrophils Relative %: 68.4 % (ref 43.0–77.0)
Platelets: 326 10*3/uL (ref 150.0–400.0)
RBC: 4.36 Mil/uL (ref 3.87–5.11)
RDW: 14.3 % (ref 11.5–15.5)
WBC: 6.1 10*3/uL (ref 4.0–10.5)

## 2016-04-17 LAB — COMPREHENSIVE METABOLIC PANEL
ALT: 19 U/L (ref 0–35)
AST: 16 U/L (ref 0–37)
Albumin: 4.1 g/dL (ref 3.5–5.2)
Alkaline Phosphatase: 61 U/L (ref 39–117)
BUN: 10 mg/dL (ref 6–23)
CO2: 25 mEq/L (ref 19–32)
Calcium: 9.3 mg/dL (ref 8.4–10.5)
Chloride: 106 mEq/L (ref 96–112)
Creatinine, Ser: 0.76 mg/dL (ref 0.40–1.20)
GFR: 90.85 mL/min (ref >60.00)
Glucose, Bld: 84 mg/dL (ref 70–99)
Potassium: 4.4 mEq/L (ref 3.5–5.1)
Sodium: 140 mEq/L (ref 135–145)
Total Bilirubin: 0.4 mg/dL (ref 0.2–1.2)
Total Protein: 6.6 g/dL (ref 6.0–8.3)

## 2016-04-17 LAB — VITAMIN D 25 HYDROXY (VIT D DEFICIENCY, FRACTURES): VITD: 33.62 ng/mL (ref 30.00–100.00)

## 2016-04-17 LAB — TSH: TSH: 1.66 u[IU]/mL (ref 0.35–4.50)

## 2016-04-17 LAB — HEMOGLOBIN A1C: Hgb A1c MFr Bld: 5.4 % (ref 4.6–6.5)

## 2016-04-17 LAB — LDL CHOLESTEROL, DIRECT: Direct LDL: 109 mg/dL

## 2016-04-18 ENCOUNTER — Telehealth: Payer: Self-pay | Admitting: *Deleted

## 2016-04-18 NOTE — Telephone Encounter (Signed)
Received a call from [facility] lab stating that they did not receive a urine specimen that showed collected yesterday morning. They are going to cancel the order. No urine found in lab.

## 2016-04-19 ENCOUNTER — Encounter: Payer: Self-pay | Admitting: Internal Medicine

## 2016-04-19 ENCOUNTER — Other Ambulatory Visit: Payer: Medicare Other

## 2016-04-19 DIAGNOSIS — I10 Essential (primary) hypertension: Secondary | ICD-10-CM

## 2016-04-19 LAB — MICROALBUMIN / CREATININE URINE RATIO
Creatinine,U: 88.4 mg/dL
Microalb Creat Ratio: 0.8 mg/g (ref 0.0–30.0)
Microalb, Ur: <0.7 mg/dL (ref 0.0–1.9)

## 2016-04-19 NOTE — Telephone Encounter (Signed)
Yea she was suppose to come back, she wasn't able to go

## 2016-04-19 NOTE — Telephone Encounter (Signed)
I think this should have been sent to you.  Thanks

## 2016-04-20 ENCOUNTER — Encounter: Payer: Self-pay | Admitting: Internal Medicine

## 2016-04-24 ENCOUNTER — Telehealth: Payer: Self-pay | Admitting: Obstetrics and Gynecology

## 2016-04-24 DIAGNOSIS — Z30013 Encounter for initial prescription of injectable contraceptive: Secondary | ICD-10-CM

## 2016-04-24 NOTE — Telephone Encounter (Signed)
Margaret Zuniga called saying she has an appointment on June 8th to discuss changing her birth control to receiving the Depo Injection. After contacting her insurance carrier, she was told the Depo injection won't be covered. She's wondering if Dr. Marcelline Mates or her nurse can contact her insurance carrier and give an appeal or something so they'll carry it. She'd like a phone call regarding this.   Pt's ph# [phone] Thank you.

## 2016-04-25 MED ORDER — MEDROXYPROGESTERONE ACETATE 150 MG/ML IM SUSP
150.0000 mg | INTRAMUSCULAR | Status: DC
Start: 2016-04-25 — End: 2017-03-20

## 2016-04-25 NOTE — Telephone Encounter (Signed)
Called pt, no answer. LM for pt stating that I would send in RX for Depo Provera injection and that the pharmacy would inform me if a prior auth needed to be completed. RX sent.

## 2016-05-12 ENCOUNTER — Encounter: Payer: Self-pay | Admitting: Internal Medicine

## 2016-05-14 ENCOUNTER — Encounter: Payer: Self-pay | Admitting: Obstetrics and Gynecology

## 2016-05-15 ENCOUNTER — Other Ambulatory Visit: Payer: Self-pay | Admitting: Internal Medicine

## 2016-05-15 DIAGNOSIS — M79672 Pain in left foot: Secondary | ICD-10-CM

## 2016-05-15 DIAGNOSIS — M79671 Pain in right foot: Secondary | ICD-10-CM

## 2016-05-18 ENCOUNTER — Encounter: Payer: Self-pay | Admitting: Obstetrics and Gynecology

## 2016-05-18 ENCOUNTER — Ambulatory Visit (INDEPENDENT_AMBULATORY_CARE_PROVIDER_SITE_OTHER): Payer: Medicare Other | Admitting: Obstetrics and Gynecology

## 2016-05-18 VITALS — BP 139/90 | HR 102 | Ht 63.0 in | Wt 202.6 lb

## 2016-05-18 DIAGNOSIS — Z308 Encounter for other contraceptive management: Secondary | ICD-10-CM

## 2016-05-18 DIAGNOSIS — Z30013 Encounter for initial prescription of injectable contraceptive: Secondary | ICD-10-CM

## 2016-05-18 MED ORDER — MEDROXYPROGESTERONE ACETATE 150 MG/ML IM SUSP
150.0000 mg | Freq: Once | INTRAMUSCULAR | Status: AC
  Administered 2016-05-18: 150 mg via INTRAMUSCULAR

## 2016-05-18 NOTE — Patient Instructions (Signed)
To bring next dose of Depo Provera medication to next visit.

## 2016-05-18 NOTE — Progress Notes (Signed)
    GYNECOLOGY CLINIC PROGRESS NOTE  Subjective:    Margaret Zuniga is a 35 y.o. P0 female who presents for contraception counseling. The patient has no complaints today. The patient is currently sexually active. She has recently been started on a new medication (Tegretol), and is concerned about the effects of the medication with her OCPs (was told that it decreases effectiveness).  Is desiring to switch to Depo Provera   Menstrual History: OB History    Gravida Para Term Preterm AB TAB SAB Ectopic Multiple Living   0 0 0 0 0 0 0 0 0 0       Menarche age: 26 Patient's last menstrual period was 04/25/2016.    The following portions of the patient's history were reviewed and updated as appropriate: allergies, current medications, past family history, past medical history, past social history and past surgical history.  Review of Systems A comprehensive review of systems was negative.   Objective:    BP 139/90 mmHg  Pulse 102  Ht 5\' 3"  (1.6 m)  Wt 202 lb 9.6 oz (91.899 kg)  BMI 35.90 kg/m2  LMP 04/25/2016 General appearance: alert and no distress Exam deferred  Assessment:    35 y.o., discontinuing OCP (estrogen/progesterone), no contraindications.   Desires to switch to Depo Provera  Plan:    All questions answered.  Discussed that Depo Provera may be less likely to be affected by new medication as it is not administered orally and can bypass the first-pass affect. Will administer Depo Provera injection today.  Encouraged to use back-up method x 1 week. RTC in 3 months.    Rubie Maid, MD [facility]

## 2016-05-22 ENCOUNTER — Encounter: Payer: Self-pay | Admitting: Internal Medicine

## 2016-05-22 MED ORDER — METOPROLOL SUCCINATE ER 100 MG PO TB24: 100.0000 mg | ORAL_TABLET | Freq: Every day | ORAL | Status: DC

## 2016-05-24 DIAGNOSIS — M791 Myalgia: Secondary | ICD-10-CM | POA: Diagnosis not present

## 2016-05-24 DIAGNOSIS — G43719 Chronic migraine without aura, intractable, without status migrainosus: Secondary | ICD-10-CM | POA: Diagnosis not present

## 2016-05-24 DIAGNOSIS — M542 Cervicalgia: Secondary | ICD-10-CM | POA: Diagnosis not present

## 2016-05-24 DIAGNOSIS — R51 Headache: Secondary | ICD-10-CM | POA: Diagnosis not present

## 2016-05-31 DIAGNOSIS — B353 Tinea pedis: Secondary | ICD-10-CM | POA: Diagnosis not present

## 2016-06-19 ENCOUNTER — Telehealth: Payer: Self-pay | Admitting: Internal Medicine

## 2016-06-19 DIAGNOSIS — R911 Solitary pulmonary nodule: Secondary | ICD-10-CM

## 2016-06-19 DIAGNOSIS — Z3200 Encounter for pregnancy test, result unknown: Secondary | ICD-10-CM

## 2016-06-19 DIAGNOSIS — R0600 Dyspnea, unspecified: Secondary | ICD-10-CM

## 2016-06-19 NOTE — Telephone Encounter (Signed)
Called and spoke with patient, she is having some increased SOB with talking and activity.  Last year a CT of the chest was done and there was a tiny subpleural nodule found.  She wants to see if that has changed any.  Would like a repeat to compare.  Please advise. thanks

## 2016-06-19 NOTE — Telephone Encounter (Signed)
Pt mother called stating that pt would like to have a CT done of here chest... Please advise pt

## 2016-06-20 ENCOUNTER — Other Ambulatory Visit (INDEPENDENT_AMBULATORY_CARE_PROVIDER_SITE_OTHER): Payer: Medicare Other

## 2016-06-20 ENCOUNTER — Other Ambulatory Visit: Payer: Medicare Other

## 2016-06-20 ENCOUNTER — Ambulatory Visit (INDEPENDENT_AMBULATORY_CARE_PROVIDER_SITE_OTHER): Payer: Medicare Other

## 2016-06-20 DIAGNOSIS — Z3200 Encounter for pregnancy test, result unknown: Secondary | ICD-10-CM | POA: Diagnosis not present

## 2016-06-20 DIAGNOSIS — R0600 Dyspnea, unspecified: Secondary | ICD-10-CM

## 2016-06-20 DIAGNOSIS — R0602 Shortness of breath: Secondary | ICD-10-CM | POA: Diagnosis not present

## 2016-06-20 NOTE — Telephone Encounter (Signed)
Scheduled patient for 3.00 Appt. On 06/21/16 left message t return call for further instruction to Chest x-ray. If patient call please schedule X-ray this afternoon and put patient on afternoon lab schedule PTU today.

## 2016-06-20 NOTE — Telephone Encounter (Signed)
CT chest will be ordered.  I just had a 3:00 cancellation for July 12/tomorrow,  Please put her in that slot and have her come either today or earlier  Than appt tomorrow  to get a chest x ray .  She'll need a UPT first

## 2016-06-20 NOTE — Telephone Encounter (Signed)
Patient has requested to be called at [phone]

## 2016-06-20 NOTE — Telephone Encounter (Signed)
Talked to patient, she stated that Juliann Pulse had just called and made her aware. thanks

## 2016-06-21 ENCOUNTER — Ambulatory Visit (INDEPENDENT_AMBULATORY_CARE_PROVIDER_SITE_OTHER): Payer: Medicare Other | Admitting: Internal Medicine

## 2016-06-21 ENCOUNTER — Encounter: Payer: Self-pay | Admitting: Internal Medicine

## 2016-06-21 VITALS — BP 132/94 | HR 101 | Temp 98.1°F | Resp 12 | Ht 63.0 in | Wt 201.8 lb

## 2016-06-21 DIAGNOSIS — R0600 Dyspnea, unspecified: Secondary | ICD-10-CM

## 2016-06-21 LAB — POCT URINE PREGNANCY: Preg Test, Ur: NEGATIVE

## 2016-06-21 NOTE — Progress Notes (Signed)
Pre-visit discussion using our clinic review tool. No additional management support is needed unless otherwise documented below in the visit note.  

## 2016-06-21 NOTE — Progress Notes (Signed)
Subjective:  Patient ID: Margaret Zuniga, female    DOB: September 14, 1981  Age: 35 y.o. MRN: [id number]  CC: The encounter diagnosis was Dyspnea.  HPI Margaret Zuniga presents for worsenign shortness of breath.  Has chronic dyspnea, but is runnign out of air while talking,   Can't walk far.  Worse when lying flat. Sleeps on two pillows chronically   No cough or fluid retention,   No chest pain.  Leads sedentary lifestyle due to daily severe headaches.Seing freeman for headaches,  ahs had botox and trigger point injections  Which provide transient relief 1-2 days,    Back on Toprol and now on tegretol for the past 10 weeks, has not improved headaches yet   Prior evaluation for same in 2014: normal ECHO.  Chest x ray yesterday lung fields clear, unchanged mildly elevated left hemodiaphragm.  No prior PFTs  Outpatient Prescriptions Prior to Visit  Medication Sig Dispense Refill  . Acetaminophen-Caffeine (EXCEDRIN TENSION HEADACHE) 500-65 MG TABS Take by mouth.    . B Complex-C-Folic Acid (MULTIVITAMIN, STRESS FORMULA) tablet Take 1 tablet by mouth daily.      . baclofen (LIORESAL) 10 MG tablet Take 10 mg by mouth 3 (three) times daily.     . bisacodyl (DULCOLAX) 5 MG EC tablet Take 5 mg by mouth daily as needed. For constipation    . BOOSTRIX 5-2.5-18.5 injection TO BE ADMINISTERED BY PHARMACIST FOR IMMUNIZATION  0  . carbamazepine (CARBATROL) 100 MG 12 hr capsule     . cetirizine (ZYRTEC) 10 MG tablet Take 10 mg by mouth daily.    Marland Kitchen docusate sodium (COLACE) 100 MG capsule Take 2 capsules (200 mg total) by mouth 2 (two) times daily. 120 capsule 0  . escitalopram (LEXAPRO) 10 MG tablet TAKE 1 TABLET (10 MG TOTAL) BY MOUTH DAILY. 90 tablet 1  . HYDROcodone-acetaminophen (NORCO) 10-325 MG per tablet Take 1 tablet by mouth every 8 (eight) hours as needed. 30 tablet 0  . medroxyPROGESTERone (DEPO-PROVERA) 150 MG/ML injection Inject 1 mL (150 mg total) into the muscle every 3 (three) months. 1 mL 3  . Melatonin 5  MG TABS Take by mouth daily.    . metoprolol succinate (TOPROL-XL) 100 MG 24 hr tablet Take 1 tablet (100 mg total) by mouth daily. Take with or immediately following a meal. 90 tablet 1  . nystatin ointment (MYCOSTATIN) Apply 1 application topically 2 (two) times daily. To affected area on foot 30 g 1  . ranitidine (ZANTAC) 150 MG capsule Take 1 capsule (150 mg total) by mouth 2 (two) times daily. 28 capsule 0  . senna (SENOKOT) 8.6 MG TABS tablet Take 2 tablets (17.2 mg total) by mouth 2 (two) times daily. 120 each 0  . polyethylene glycol-electrolytes (NULYTELY/GOLYTELY) 420 G solution Drink one 8 ounce glass every 30 minutes until liquid stool becomes clear 4000 mL 0  . PEG 3350-KCl-NaBcb-NaCl-NaSulf (PEG-3350/ELECTROLYTES) 236 G SOLR Reported on 06/21/2016  0  . atenolol (TENORMIN) 25 MG tablet Take 25 mg by mouth daily. Reported on 06/21/2016  2  . candesartan (ATACAND) 32 MG tablet Take 32 mg by mouth daily. Reported on 06/21/2016  1  . verapamil (CALAN-SR) 240 MG CR tablet Reported on 06/21/2016  2   No facility-administered medications prior to visit.    Review of Systems;  Patient denies headache, fevers, malaise, unintentional weight loss, skin rash, eye pain, sinus congestion and sinus pain, sore throat, dysphagia,  hemoptysis , cough, dyspnea, wheezing, chest pain, palpitations,  orthopnea, edema, abdominal pain, nausea, melena, diarrhea, constipation, flank pain, dysuria, hematuria, urinary  Frequency, nocturia, numbness, tingling, seizures,  Focal weakness, Loss of consciousness,  Tremor, insomnia, depression, anxiety, and suicidal ideation.      Objective:  BP 132/94 mmHg  Pulse 101  Temp(Src) 98.1 F (36.7 C) (Oral)  Resp 12  Ht 5\' 3"  (1.6 m)  Wt 201 lb 12 oz (91.513 kg)  BMI 35.75 kg/m2  SpO2 98%  LMP 04/24/2016 (Approximate)  BP Readings from Last 3 Encounters:  06/21/16 132/94  05/18/16 139/90  04/03/16 140/98    Wt Readings from Last 3 Encounters:  06/21/16 201  lb 12 oz (91.513 kg)  05/18/16 202 lb 9.6 oz (91.899 kg)  04/03/16 203 lb (92.08 kg)    General appearance: alert, cooperative and appears stated age Ears: normal TM's and external ear canals both ears Throat: lips, mucosa, and tongue normal; teeth and gums normal Neck: no adenopathy, no carotid bruit, supple, symmetrical, trachea midline and thyroid not enlarged, symmetric, no tenderness/mass/nodules Back: symmetric, no curvature. ROM normal. No CVA tenderness. Lungs: clear to auscultation bilaterally Heart: regular rate and rhythm, S1, S2 normal, no murmur, click, rub or gallop Abdomen: soft, non-tender; bowel sounds normal; no masses,  no organomegaly Pulses: 2+ and symmetric Skin: Skin color, texture, turgor normal. No rashes or lesions Lymph nodes: Cervical, supraclavicular, and axillary nodes normal.  Lab Results  Component Value Date   HGBA1C 5.4 04/17/2016    Lab Results  Component Value Date   CREATININE 0.76 04/17/2016   CREATININE 0.84 07/01/2015   CREATININE 0.70 06/01/2015    Lab Results  Component Value Date   WBC 6.1 04/17/2016   HGB 12.9 04/17/2016   HCT 38.4 04/17/2016   PLT 326.0 04/17/2016   GLUCOSE 84 04/17/2016   CHOL 169 04/17/2016   TRIG 110.0 04/17/2016   HDL 53.90 04/17/2016   LDLDIRECT 109.0 04/17/2016   LDLCALC 93 04/17/2016   ALT 19 04/17/2016   AST 16 04/17/2016   NA 140 04/17/2016   K 4.4 04/17/2016   CL 106 04/17/2016   CREATININE 0.76 04/17/2016   BUN 10 04/17/2016   CO2 25 04/17/2016   TSH 1.66 04/17/2016   HGBA1C 5.4 04/17/2016   MICROALBUR <0.7 04/19/2016    US Abdomen Limited Ruq  07/01/2015  CLINICAL DATA:  One week history of right upper quadrant pain EXAM: US ABDOMEN LIMITED - RIGHT UPPER QUADRANT COMPARISON:  None. FINDINGS: Gallbladder: No gallstones or wall thickening visualized. There is no pericholecystic fluid. No sonographic Murphy sign noted. Common bile duct: Diameter: 5 mm. There is no intrahepatic or extrahepatic  biliary duct dilatation. Liver: No focal lesion identified. Within normal limits in parenchymal echogenicity. IMPRESSION: Study within normal limits. Electronically Signed   By: Lowella Grip III M.D.   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  Chest x ray normal  Referral to [facility] following PFTs      Relevant Orders   Pulmonary Function Test [facility] Only   Ambulatory referral to Pulmonology      I have discontinued Ms. Pagliaro's atenolol, polyethylene glycol-electrolytes, verapamil, and candesartan. I am also having her maintain her (multivitamin, stress formula), bisacodyl, cetirizine, baclofen, Melatonin, HYDROcodone-acetaminophen, docusate sodium, senna, ranitidine, PEG-3350/Electrolytes, BOOSTRIX, escitalopram, nystatin ointment, medroxyPROGESTERone, Acetaminophen-Caffeine, carbamazepine, metoprolol succinate, and griseofulvin.  Meds  ordered this encounter  Medications  . griseofulvin (GRIS-PEG) 250 MG tablet    Sig: Take 250 mg by mouth 2 (two) times daily.     Medications Discontinued During This Encounter  Medication Reason  . atenolol (TENORMIN) 25 MG tablet Change in therapy  . candesartan (ATACAND) 32 MG tablet Change in therapy  . verapamil (CALAN-SR) 240 MG CR tablet Change in therapy  . polyethylene glycol-electrolytes (NULYTELY/GOLYTELY) 420 G solution Completed Course    Follow-up: No Follow-up on file.   Crecencio Mc, MD

## 2016-06-21 NOTE — Patient Instructions (Signed)
Your breathing problems may be due to your left hemidiaphragm paralysis causing restrictive lung disease.    Pulmonary function tests and polmonary evaluation will help determine this   Your chest Ct has been ordered.

## 2016-06-22 ENCOUNTER — Telehealth: Payer: Self-pay | Admitting: Physician Assistant

## 2016-06-22 DIAGNOSIS — R0600 Dyspnea, unspecified: Secondary | ICD-10-CM | POA: Insufficient documentation

## 2016-06-22 NOTE — Assessment & Plan Note (Signed)
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  Chest x ray normal  Referral to [facility] following PFTs

## 2016-06-22 NOTE — Telephone Encounter (Signed)
error 

## 2016-06-23 ENCOUNTER — Encounter: Payer: Self-pay | Admitting: Internal Medicine

## 2016-06-27 ENCOUNTER — Ambulatory Visit: Payer: Medicare Other | Attending: Internal Medicine

## 2016-06-27 DIAGNOSIS — R0600 Dyspnea, unspecified: Secondary | ICD-10-CM

## 2016-06-27 DIAGNOSIS — J449 Chronic obstructive pulmonary disease, unspecified: Secondary | ICD-10-CM | POA: Insufficient documentation

## 2016-06-27 DIAGNOSIS — R0602 Shortness of breath: Secondary | ICD-10-CM | POA: Diagnosis not present

## 2016-06-27 MED ORDER — SODIUM CHLORIDE 0.9 % IN NEBU
3.0000 mL | INHALATION_SOLUTION | Freq: Once | RESPIRATORY_TRACT | Status: AC
  Administered 2016-06-27: 3 mL via RESPIRATORY_TRACT

## 2016-06-27 MED ORDER — METHACHOLINE 0.25 MG/ML NEB SOLN
2.0000 mL | Freq: Once | RESPIRATORY_TRACT | Status: AC
  Administered 2016-06-27: 0.5 mg via RESPIRATORY_TRACT
  Filled 2016-06-27: qty 2

## 2016-06-27 MED ORDER — ALBUTEROL SULFATE (2.5 MG/3ML) 0.083% IN NEBU
2.5000 mg | INHALATION_SOLUTION | Freq: Once | RESPIRATORY_TRACT | Status: AC
Start: 2016-06-27 — End: 2016-06-27
  Administered 2016-06-27: 2.5 mg via RESPIRATORY_TRACT
  Filled 2016-06-27: qty 3

## 2016-06-27 MED ORDER — METHACHOLINE 0.0625 MG/ML NEB SOLN
2.0000 mL | Freq: Once | RESPIRATORY_TRACT | Status: AC
  Administered 2016-06-27: 0.125 mg via RESPIRATORY_TRACT
  Filled 2016-06-27: qty 2

## 2016-06-27 MED ORDER — METHACHOLINE 1 MG/ML NEB SOLN
2.0000 mL | Freq: Once | RESPIRATORY_TRACT | Status: AC
  Administered 2016-06-27: 2 mg via RESPIRATORY_TRACT
  Filled 2016-06-27: qty 2

## 2016-06-27 MED ORDER — METHACHOLINE 16 MG/ML NEB SOLN
2.0000 mL | Freq: Once | RESPIRATORY_TRACT | Status: AC
  Administered 2016-06-27: 32 mg via RESPIRATORY_TRACT
  Filled 2016-06-27: qty 2

## 2016-06-27 MED ORDER — METHACHOLINE 4 MG/ML NEB SOLN
2.0000 mL | Freq: Once | RESPIRATORY_TRACT | Status: AC
  Administered 2016-06-27: 8 mg via RESPIRATORY_TRACT
  Filled 2016-06-27: qty 2

## 2016-06-29 DIAGNOSIS — B353 Tinea pedis: Secondary | ICD-10-CM | POA: Diagnosis not present

## 2016-07-03 ENCOUNTER — Encounter: Payer: Self-pay | Admitting: Internal Medicine

## 2016-07-03 ENCOUNTER — Ambulatory Visit (INDEPENDENT_AMBULATORY_CARE_PROVIDER_SITE_OTHER): Payer: Medicare Other | Admitting: Internal Medicine

## 2016-07-03 VITALS — BP 134/76 | HR 82 | Ht 63.0 in | Wt 201.8 lb

## 2016-07-03 DIAGNOSIS — R0600 Dyspnea, unspecified: Secondary | ICD-10-CM

## 2016-07-03 DIAGNOSIS — R911 Solitary pulmonary nodule: Secondary | ICD-10-CM

## 2016-07-03 NOTE — Assessment & Plan Note (Addendum)
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  There are no inciting factors to suggest a acute etiology of the shortness of breath, shortness of breath is gradual. She has negative methacholine challenge testing, and a CT scan from a year ago does not show any significant interstitial changes in the pleura. Pulmonary function test that shows some mild restriction, which could be due to chest wall obesity, or ILD; especially with significant drop in her RV.  Plan: High-resolution CAT scan without contrast Continue with diet and exercise

## 2016-07-03 NOTE — Assessment & Plan Note (Signed)
CT March thousand 16 with tiny subpleural left lower lobe nodule, 5 mm  Plan: Surveillance CAT scans, patient does have a CT scheduled for Wednesday, however given her shortness of breath, will further evaluate with a high-resolution CAT scan
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
## 2016-07-03 NOTE — Patient Instructions (Signed)
Follow up with Dr. Stevenson Clinch in:4-6 weeks - we will change your current CT to a high resolution CT scan of the chest. - avoid 2nd hand smoking as much as possible.

## 2016-07-05 ENCOUNTER — Ambulatory Visit
Admission: RE | Admit: 2016-07-05 | Discharge: 2016-07-05 | Disposition: A | Discharge status: Home / Self Care | Payer: Medicare Other | Source: Ambulatory Visit | Attending: Internal Medicine | Admitting: Internal Medicine

## 2016-07-05 ENCOUNTER — Ambulatory Visit: Payer: Medicare Other

## 2016-07-05 DIAGNOSIS — B353 Tinea pedis: Secondary | ICD-10-CM | POA: Diagnosis not present

## 2016-07-05 DIAGNOSIS — R911 Solitary pulmonary nodule: Secondary | ICD-10-CM | POA: Diagnosis not present

## 2016-07-05 DIAGNOSIS — R0600 Dyspnea, unspecified: Secondary | ICD-10-CM | POA: Diagnosis not present

## 2016-07-06 ENCOUNTER — Telehealth: Payer: Self-pay | Admitting: *Deleted

## 2016-07-06 DIAGNOSIS — G43719 Chronic migraine without aura, intractable, without status migrainosus: Secondary | ICD-10-CM | POA: Diagnosis not present

## 2016-07-06 DIAGNOSIS — M542 Cervicalgia: Secondary | ICD-10-CM | POA: Diagnosis not present

## 2016-07-06 DIAGNOSIS — M791 Myalgia: Secondary | ICD-10-CM | POA: Diagnosis not present

## 2016-07-06 DIAGNOSIS — R51 Headache: Secondary | ICD-10-CM | POA: Diagnosis not present

## 2016-07-06 NOTE — Telephone Encounter (Signed)
-----   Message from Vilinda Boehringer, MD sent at 07/05/2016  4:10 PM EDT ----- Please informed patient and her CAT scan of chest was reviewed by me area there are no findings to suggest any signs of interstitial lung disease, or any other abnormalities. There is a small 4 mm nodule which has been stable, currently no further imaging needed at this time. Further details can be discussed at follow-up visit.  Thanks VM

## 2016-07-06 NOTE — Telephone Encounter (Signed)
Pt informed of results. Nothing further needed. 

## 2016-07-25 DIAGNOSIS — B353 Tinea pedis: Secondary | ICD-10-CM | POA: Diagnosis not present

## 2016-08-15 ENCOUNTER — Ambulatory Visit (INDEPENDENT_AMBULATORY_CARE_PROVIDER_SITE_OTHER): Payer: Medicare Other | Admitting: Internal Medicine

## 2016-08-15 ENCOUNTER — Encounter: Payer: Self-pay | Admitting: Obstetrics and Gynecology

## 2016-08-15 ENCOUNTER — Ambulatory Visit (INDEPENDENT_AMBULATORY_CARE_PROVIDER_SITE_OTHER): Payer: Medicare Other | Admitting: Obstetrics and Gynecology

## 2016-08-15 ENCOUNTER — Encounter: Payer: Self-pay | Admitting: Internal Medicine

## 2016-08-15 VITALS — BP 125/84 | HR 88 | Ht 63.0 in | Wt 200.8 lb

## 2016-08-15 VITALS — BP 132/88 | HR 79 | Ht 63.0 in | Wt 200.0 lb

## 2016-08-15 DIAGNOSIS — Z3049 Encounter for surveillance of other contraceptives: Secondary | ICD-10-CM | POA: Diagnosis not present

## 2016-08-15 DIAGNOSIS — R911 Solitary pulmonary nodule: Secondary | ICD-10-CM

## 2016-08-15 DIAGNOSIS — J4520 Mild intermittent asthma, uncomplicated: Secondary | ICD-10-CM | POA: Diagnosis not present

## 2016-08-15 DIAGNOSIS — J45909 Unspecified asthma, uncomplicated: Secondary | ICD-10-CM | POA: Diagnosis not present

## 2016-08-15 DIAGNOSIS — Z3042 Encounter for surveillance of injectable contraceptive: Secondary | ICD-10-CM

## 2016-08-15 HISTORY — DX: Unspecified asthma, uncomplicated: J45.909

## 2016-08-15 MED ORDER — MEDROXYPROGESTERONE ACETATE 150 MG/ML IM SUSP
150.0000 mg | Freq: Once | INTRAMUSCULAR | Status: AC
  Administered 2016-08-15: 150 mg via INTRAMUSCULAR

## 2016-08-15 MED ORDER — FLUTICASONE FUROATE 100 MCG/ACT IN AEPB
1.0000 | INHALATION_SPRAY | Freq: Every day | RESPIRATORY_TRACT | 0 refills | Status: AC
Start: 2016-08-15 — End: 2016-08-16

## 2016-08-15 NOTE — Assessment & Plan Note (Signed)
Patient with negative methacholine challenge test and withconstructional primary function testing, however did have mild reduction in RV and DLCO. High-resolution CAT scan with no significant findings of ILD, but with small airway obstruction.  I believe at this time that the majority of her dyspnea on exertion could be extrinsic asthma or some form of it. We will try a trial of ICS for patient.  Plan: -Arnuity trial, 100 g, 1 puff daily, gargle and rinse after each use.

## 2016-08-15 NOTE — Progress Notes (Signed)
Patient ID: Margaret Zuniga, female   DOB: [birth year], 35 y.o.   MRN: [id number] Patient seen in the office today and instructed on use of ARNUITY ELLPITA.  Patient expressed understanding and demonstrated technique.
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
## 2016-08-15 NOTE — Assessment & Plan Note (Signed)
CT March 2016 with tiny subpleural left lower lobe nodule, 5 mm HRCT 06/2016 - 25mm subpleural LLL nodule  Plan: -No risk factors for malignancy -Stable subpleural subcentimeter pulmonary nodule on 2 subsequent CAT scans, no further imaging needed at this time

## 2016-08-15 NOTE — Patient Instructions (Signed)
Follow up with Dr. Stevenson Clinch in:3 months - based on your HRCT with air trapping, you have signs/symptoms of asthma - trial of Arnuity 153mcg - 1 puff daily. -gargle and rinse after each use. If improvement after 1 week of use, then call us back for a prescription.  - cont with daily exercise as tolerated.

## 2016-08-15 NOTE — Progress Notes (Signed)
    GYNECOLOGY PROGRESS NOTE  Subjective:    Patient ID: Margaret Zuniga, female    DOB: 18-May-1981, 35 y.o.   MRN: [id number]  HPI  Patient is a 35 y.o. G0P0000 female who presents for 3 month f/u of  medication (Depo Provera, given for contraception, switched from pills last visit). Denies any side effects or adverse reactions.   Last menses was in May 2017.    The following portions of the patient's history were reviewed and updated as appropriate: allergies, current medications, past family history, past medical history, past social history, past surgical history and problem list.  Review of Systems Pertinent items are noted in HPI.   Objective:   Blood pressure 125/84, pulse 88, height 5\' 3"  (1.6 m), weight 200 lb 12.8 oz (91.1 kg). Exam deferred   Assessment:   Contraception surveillance  Plan:   Patient doing well on Depo Provera, denies complaints. Menses suppressed currently.   RTC q 3 months for Depo Provera injections. Injection given today. RTC in 2 months for annual exam (already previously scheduled).    Rubie Maid, MD [facility]

## 2016-08-21 DIAGNOSIS — M791 Myalgia: Secondary | ICD-10-CM | POA: Diagnosis not present

## 2016-08-21 DIAGNOSIS — R51 Headache: Secondary | ICD-10-CM | POA: Diagnosis not present

## 2016-08-21 DIAGNOSIS — G43719 Chronic migraine without aura, intractable, without status migrainosus: Secondary | ICD-10-CM | POA: Diagnosis not present

## 2016-08-21 DIAGNOSIS — M542 Cervicalgia: Secondary | ICD-10-CM | POA: Diagnosis not present

## 2016-08-22 ENCOUNTER — Encounter: Payer: Self-pay | Admitting: Internal Medicine

## 2016-08-22 ENCOUNTER — Ambulatory Visit: Payer: Medicare Other | Admitting: Obstetrics and Gynecology

## 2016-08-22 ENCOUNTER — Telehealth: Payer: Self-pay

## 2016-08-22 MED ORDER — FLUTICASONE FUROATE 100 MCG/ACT IN AEPB: 1.0000 | INHALATION_SPRAY | Freq: Every day | RESPIRATORY_TRACT | 5 refills | Status: DC

## 2016-08-22 NOTE — Telephone Encounter (Signed)
Pt states the sample of arnuity that was given to her on her last OV, she feels it is working and would like a rx sent to [facility] in [place]. rx has been sent in to preferred pharmacy. Pt aware and voiced understanding. Nothing further needed.

## 2016-08-22 NOTE — Telephone Encounter (Signed)
LMOVM Will await call back.  

## 2016-08-22 NOTE — Telephone Encounter (Signed)
Pt would like a rx for her inahler. Called to [facility] [place]

## 2016-08-23 DIAGNOSIS — B353 Tinea pedis: Secondary | ICD-10-CM | POA: Diagnosis not present

## 2016-08-25 ENCOUNTER — Other Ambulatory Visit: Payer: Self-pay | Admitting: Internal Medicine

## 2016-08-29 ENCOUNTER — Ambulatory Visit: Payer: Medicare Other | Admitting: Obstetrics and Gynecology

## 2016-09-07 NOTE — Telephone Encounter (Signed)
This encounter was created in error - please disregard.

## 2016-10-02 DIAGNOSIS — R51 Headache: Secondary | ICD-10-CM | POA: Diagnosis not present

## 2016-10-02 DIAGNOSIS — M791 Myalgia: Secondary | ICD-10-CM | POA: Diagnosis not present

## 2016-10-02 DIAGNOSIS — M542 Cervicalgia: Secondary | ICD-10-CM | POA: Diagnosis not present

## 2016-10-02 DIAGNOSIS — G43719 Chronic migraine without aura, intractable, without status migrainosus: Secondary | ICD-10-CM | POA: Diagnosis not present

## 2016-10-17 ENCOUNTER — Encounter: Payer: Self-pay | Admitting: Obstetrics and Gynecology

## 2016-10-17 ENCOUNTER — Ambulatory Visit (INDEPENDENT_AMBULATORY_CARE_PROVIDER_SITE_OTHER): Payer: Medicare Other | Admitting: Obstetrics and Gynecology

## 2016-10-17 VITALS — BP 128/78 | HR 75 | Ht 63.0 in | Wt 199.1 lb

## 2016-10-17 DIAGNOSIS — Z01419 Encounter for gynecological examination (general) (routine) without abnormal findings: Secondary | ICD-10-CM

## 2016-10-17 DIAGNOSIS — Z3042 Encounter for surveillance of injectable contraceptive: Secondary | ICD-10-CM | POA: Diagnosis not present

## 2016-10-17 DIAGNOSIS — E669 Obesity, unspecified: Secondary | ICD-10-CM

## 2016-10-17 DIAGNOSIS — I10 Essential (primary) hypertension: Secondary | ICD-10-CM

## 2016-10-17 NOTE — Patient Instructions (Signed)
Health Maintenance, Female Adopting a healthy lifestyle and getting preventive care can go a long way to promote health and wellness. Talk with your health care provider about what schedule of regular examinations is right for you. This is a good chance for you to check in with your provider about disease prevention and staying healthy. In between checkups, there are plenty of things you can do on your own. Experts have done a lot of research about which lifestyle changes and preventive measures are most likely to keep you healthy. Ask your health care provider for more information. WEIGHT AND DIET  Eat a healthy diet  Be sure to include plenty of vegetables, fruits, low-fat dairy products, and lean protein.  Do not eat a lot of foods high in solid fats, added sugars, or salt.  Get regular exercise. This is one of the most important things you can do for your health.  Most adults should exercise for at least 150 minutes each week. The exercise should increase your heart rate and make you sweat (moderate-intensity exercise).  Most adults should also do strengthening exercises at least twice a week. This is in addition to the moderate-intensity exercise.  Maintain a healthy weight  Body mass index (BMI) is a measurement that can be used to identify possible weight problems. It estimates body fat based on height and weight. Your health care provider can help determine your BMI and help you achieve or maintain a healthy weight.  For females 20 years of age and older:   A BMI below 18.5 is considered underweight.  A BMI of 18.5 to 24.9 is normal.  A BMI of 25 to 29.9 is considered overweight.  A BMI of 30 and above is considered obese.  Watch levels of cholesterol and blood lipids  You should start having your blood tested for lipids and cholesterol at 37 years of age, then have this test every 5 years.  You may need to have your cholesterol levels checked more often if:  Your lipid  or cholesterol levels are high.  You are older than 37 years of age.  You are at high risk for heart disease.  CANCER SCREENING   Lung Cancer  Lung cancer screening is recommended for adults 55-80 years old who are at high risk for lung cancer because of a history of smoking.  A yearly low-dose CT scan of the lungs is recommended for people who:  Currently smoke.  Have quit within the past 15 years.  Have at least a 30-pack-year history of smoking. A pack year is smoking an average of one pack of cigarettes a day for 1 year.  Yearly screening should continue until it has been 15 years since you quit.  Yearly screening should stop if you develop a health problem that would prevent you from having lung cancer treatment.  Breast Cancer  Practice breast self-awareness. This means understanding how your breasts normally appear and feel.  It also means doing regular breast self-exams. Let your health care provider know about any changes, no matter how small.  If you are in your 20s or 30s, you should have a clinical breast exam (CBE) by a health care provider every 1-3 years as part of a regular health exam.  If you are 40 or older, have a CBE every year. Also consider having a breast X-ray (mammogram) every year.  If you have a family history of breast cancer, talk to your health care provider about genetic screening.  If you   are at high risk for breast cancer, talk to your health care provider about having an MRI and a mammogram every year.  Breast cancer gene (BRCA) assessment is recommended for women who have family members with BRCA-related cancers. BRCA-related cancers include:  Breast.  Ovarian.  Tubal.  Peritoneal cancers.  Results of the assessment will determine the need for genetic counseling and BRCA1 and BRCA2 testing. Cervical Cancer Your health care provider may recommend that you be screened regularly for cancer of the pelvic organs (ovaries, uterus, and  vagina). This screening involves a pelvic examination, including checking for microscopic changes to the surface of your cervix (Pap test). You may be encouraged to have this screening done every 3 years, beginning at age 21.  For women ages 30-65, health care providers may recommend pelvic exams and Pap testing every 3 years, or they may recommend the Pap and pelvic exam, combined with testing for human papilloma virus (HPV), every 5 years. Some types of HPV increase your risk of cervical cancer. Testing for HPV may also be done on women of any age with unclear Pap test results.  Other health care providers may not recommend any screening for nonpregnant women who are considered low risk for pelvic cancer and who do not have symptoms. Ask your health care provider if a screening pelvic exam is right for you.  If you have had past treatment for cervical cancer or a condition that could lead to cancer, you need Pap tests and screening for cancer for at least 20 years after your treatment. If Pap tests have been discontinued, your risk factors (such as having a new sexual partner) need to be reassessed to determine if screening should resume. Some women have medical problems that increase the chance of getting cervical cancer. In these cases, your health care provider may recommend more frequent screening and Pap tests. Colorectal Cancer  This type of cancer can be detected and often prevented.  Routine colorectal cancer screening usually begins at 37 years of age and continues through 37 years of age.  Your health care provider may recommend screening at an earlier age if you have risk factors for colon cancer.  Your health care provider may also recommend using home test kits to check for hidden blood in the stool.  A small camera at the end of a tube can be used to examine your colon directly (sigmoidoscopy or colonoscopy). This is done to check for the earliest forms of colorectal  cancer.  Routine screening usually begins at age 50.  Direct examination of the colon should be repeated every 5-10 years through 37 years of age. However, you may need to be screened more often if early forms of precancerous polyps or small growths are found. Skin Cancer  Check your skin from head to toe regularly.  Tell your health care provider about any new moles or changes in moles, especially if there is a change in a mole's shape or color.  Also tell your health care provider if you have a mole that is larger than the size of a pencil eraser.  Always use sunscreen. Apply sunscreen liberally and repeatedly throughout the day.  Protect yourself by wearing long sleeves, pants, a wide-brimmed hat, and sunglasses whenever you are outside. HEART DISEASE, DIABETES, AND HIGH BLOOD PRESSURE   High blood pressure causes heart disease and increases the risk of stroke. High blood pressure is more likely to develop in:  People who have blood pressure in the high end   of the normal range (130-139/85-89 mm Hg).  People who are overweight or obese.  People who are African American.  If you are 38-23 years of age, have your blood pressure checked every 3-5 years. If you are 61 years of age or older, have your blood pressure checked every year. You should have your blood pressure measured twice--once when you are at a hospital or clinic, and once when you are not at a hospital or clinic. Record the average of the two measurements. To check your blood pressure when you are not at a hospital or clinic, you can use:  An automated blood pressure machine at a pharmacy.  A home blood pressure monitor.  If you are between 45 years and 39 years old, ask your health care provider if you should take aspirin to prevent strokes.  Have regular diabetes screenings. This involves taking a blood sample to check your fasting blood sugar level.  If you are at a normal weight and have a low risk for diabetes,  have this test once every three years after 37 years of age.  If you are overweight and have a high risk for diabetes, consider being tested at a younger age or more often. PREVENTING INFECTION  Hepatitis B  If you have a higher risk for hepatitis B, you should be screened for this virus. You are considered at high risk for hepatitis B if:  You were born in a country where hepatitis B is common. Ask your health care provider which countries are considered high risk.  Your parents were born in a high-risk country, and you have not been immunized against hepatitis B (hepatitis B vaccine).  You have HIV or AIDS.  You use needles to inject street drugs.  You live with someone who has hepatitis B.  You have had sex with someone who has hepatitis B.  You get hemodialysis treatment.  You take certain medicines for conditions, including cancer, organ transplantation, and autoimmune conditions. Hepatitis C  Blood testing is recommended for:  Everyone born from [birth year] through 1967.  Anyone with known risk factors for hepatitis C. Sexually transmitted infections (STIs)  You should be screened for sexually transmitted infections (STIs) including gonorrhea and chlamydia if:  You are sexually active and are younger than 37 years of age.  You are older than 37 years of age and your health care provider tells you that you are at risk for this type of infection.  Your sexual activity has changed since you were last screened and you are at an increased risk for chlamydia or gonorrhea. Ask your health care provider if you are at risk.  If you do not have HIV, but are at risk, it may be recommended that you take a prescription medicine daily to prevent HIV infection. This is called pre-exposure prophylaxis (PrEP). You are considered at risk if:  You are sexually active and do not regularly use condoms or know the HIV status of your partner(s).  You take drugs by injection.  You are sexually  active with a partner who has HIV. Talk with your health care provider about whether you are at high risk of being infected with HIV. If you choose to begin PrEP, you should first be tested for HIV. You should then be tested every 3 months for as long as you are taking PrEP.  PREGNANCY   If you are premenopausal and you may become pregnant, ask your health care provider about preconception counseling.  If you may  become pregnant, take 400 to 800 micrograms (mcg) of folic acid every day.  If you want to prevent pregnancy, talk to your health care provider about birth control (contraception). OSTEOPOROSIS AND MENOPAUSE   Osteoporosis is a disease in which the bones lose minerals and strength with aging. This can result in serious bone fractures. Your risk for osteoporosis can be identified using a bone density scan.  If you are 61 years of age or older, or if you are at risk for osteoporosis and fractures, ask your health care provider if you should be screened.  Ask your health care provider whether you should take a calcium or vitamin D supplement to lower your risk for osteoporosis.  Menopause may have certain physical symptoms and risks.  Hormone replacement therapy may reduce some of these symptoms and risks. Talk to your health care provider about whether hormone replacement therapy is right for you.  HOME CARE INSTRUCTIONS   Schedule regular health, dental, and eye exams.  Stay current with your immunizations.   Do not use any tobacco products including cigarettes, chewing tobacco, or electronic cigarettes.  If you are pregnant, do not drink alcohol.  If you are breastfeeding, limit how much and how often you drink alcohol.  Limit alcohol intake to no more than 1 drink per day for nonpregnant women. One drink equals 12 ounces of beer, 5 ounces of wine, or 1 ounces of hard liquor.  Do not use street drugs.  Do not share needles.  Ask your health care provider for help if  you need support or information about quitting drugs.  Tell your health care provider if you often feel depressed.  Tell your health care provider if you have ever been abused or do not feel safe at home.   This information is not intended to replace advice given to you by your health care provider. Make sure you discuss any questions you have with your health care provider.   Document Released: 06/12/2011 Document Revised: 12/18/2014 Document Reviewed: 10/29/2013 Elsevier Interactive Patient Education [employer].

## 2016-10-17 NOTE — Progress Notes (Signed)
GYNECOLOGY ANNUAL PHYSICAL EXAM PROGRESS NOTE  Subjective:    Margaret Zuniga is a 35 y.o. [place] female who presents for an annual exam. The patient has no complaints today. The patient is sexually active. The patient wears seatbelts: yes. The patient participates in regular exercise: no. Has the patient ever been transfused or tattooed?: no. The patient reports that there is not domestic violence in her life.    Gynecologic History Menarche age: 4 No LMP recorded. Patient has had an injection. Contraception: Depo-Provera injections History of STI's:  Denies Last Pap: 07/2014. Results were: normal.  Denies h/o abnormal pap smears   Obstetric History   G0   P0   T0   P0   A0   L0    SAB0   TAB0   Ectopic0   Multiple0   Live Births0       Past Medical History:  Diagnosis Date  . Extrinsic asthma 08/15/2016  . 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 Hypertension   . 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 Screening for cervical cancer May 2012   , reportedly normal  . Shoulder impingement 2009   surgical relesase, Dr. Marry Guan    Past Surgical History:  Procedure Laterality Date  . EYE SURGERY  1995  . LEG SURGERY  1985  . SUBACROMIAL DECOMPRESSION  2000   Right shoulder, [place]  . TONSILLECTOMY  2001    Family History  Problem Relation Age of Onset  . Diabetes Mother   . Coronary artery disease Mother   . Hyperlipidemia Mother   . Hypertension Mother   . Heart disease Maternal Grandfather     Social History   Social History  . Marital status: Single    Spouse name: N/A  . Number of children: N/A  . Years of education: N/A   Occupational History  . Not on file.   Social History Main Topics  . Smoking status: Never Smoker  . Smokeless tobacco: Never Used  . Alcohol use No  . Drug use:  No  . Sexual activity: No   Other Topics Concern  . Not on file   Social History Narrative  . No narrative on file    Current Outpatient Prescriptions on File Prior to Visit  Medication Sig Dispense Refill  . Acetaminophen-Caffeine (EXCEDRIN TENSION HEADACHE) 500-65 MG TABS Take by mouth.    . B Complex-C-Folic Acid (MULTIVITAMIN, STRESS FORMULA) tablet Take 1 tablet by mouth daily.      . baclofen (LIORESAL) 10 MG tablet Take 10 mg by mouth 2 (two) times daily. 2 days a week    . BOOSTRIX 5-2.5-18.5 injection TO BE ADMINISTERED BY PHARMACIST FOR IMMUNIZATION  0  . cetirizine (ZYRTEC) 10 MG tablet Take 10 mg by mouth daily.    Marland Kitchen docusate sodium (COLACE) 100 MG capsule Take 2 capsules (200 mg total) by mouth 2 (two) times daily. 120 capsule 0  . escitalopram (LEXAPRO) 10 MG tablet TAKE 1 TABLET (10 MG TOTAL) BY MOUTH DAILY. 90 tablet 1  . Fluticasone Furoate (ARNUITY ELLIPTA) 100 MCG/ACT AEPB Inhale 1 puff into the lungs daily. 30 each 5  . medroxyPROGESTERone (DEPO-PROVERA) 150 MG/ML injection Inject 1 mL (150 mg total) into the muscle every 3 (three) months. 1 mL 3  .  metoprolol succinate (TOPROL-XL) 100 MG 24 hr tablet TAKE 1 TABLET (100 MG TOTAL) BY MOUTH DAILY. TAKE WITH OR IMMEDIATELY FOLLOWING A MEAL. 90 tablet 1  . senna (SENOKOT) 8.6 MG TABS tablet Take 2 tablets (17.2 mg total) by mouth 2 (two) times daily. 120 each 0   No current facility-administered medications on file prior to visit.     Allergies  Allergen Reactions  . Zonegran [Zonisamide] Rash      Review of Systems Constitutional: negative for chills, fatigue, fevers and sweats Eyes: negative for irritation, redness and visual disturbance Ears, nose, mouth, throat, and face: negative for hearing loss, nasal congestion, snoring and tinnitus Respiratory: negative for asthma, cough, sputum Cardiovascular: negative for chest pain, dyspnea, exertional chest pressure/discomfort, irregular heart beat, palpitations and  syncope Gastrointestinal: negative for abdominal pain, change in bowel habits, nausea and vomiting Genitourinary: negative for abnormal menstrual periods, genital lesions, sexual problems and vaginal discharge, dysuria and urinary incontinence Integument/breast: negative for breast lump, breast tenderness and nipple discharge Hematologic/lymphatic: negative for bleeding and easy bruising Musculoskeletal:negative for back pain and muscle weakness Neurological: negative for dizziness, headaches, vertigo and weakness Endocrine: negative for diabetic symptoms including polydipsia, polyuria and skin dryness Allergic/Immunologic: negative for hay fever and urticaria        Objective:  Blood pressure 128/78, pulse 75, height 5\' 3"  (1.6 m), weight 199 lb 1.6 oz (90.3 kg). Body mass index is 35.27 kg/m.  General Appearance:    Alert, cooperative, no distress, appears stated age, moderately obese  Head:    Normocephalic, without obvious abnormality, atraumatic  Eyes:    PERRL, conjunctiva/corneas clear, EOM's intact, both eyes  Ears:    Normal external ear canals, both ears  Nose:   Nares normal, septum midline, mucosa normal, no drainage or sinus tenderness  Throat:   Lips, mucosa, and tongue normal; teeth and gums normal  Neck:   Supple, symmetrical, trachea midline, no adenopathy; thyroid: no enlargement/tenderness/nodules; no carotid bruit or JVD  Back:     Symmetric, no curvature, ROM normal, no CVA tenderness  Lungs:     Clear to auscultation bilaterally, respirations unlabored  Chest Wall:    No tenderness or deformity   Heart:    Regular rate and rhythm, S1 and S2 normal, no murmur, rub or gallop  Breast Exam:    No tenderness, masses, or nipple abnormality  Abdomen:     Soft, non-tender, bowel sounds active all four quadrants, no masses, no organomegaly.    Genitalia:    Pelvic:external genitalia normal, vagina without lesions, discharge, or tenderness, rectovaginal septum  normal. Cervix  normal in appearance, no cervical motion tenderness, no adnexal masses or tenderness.  Uterus normal size, shape, mobile, regular contours, nontender.  Rectal:    Normal external sphincter.  No hemorrhoids appreciated. Internal exam not done.   Extremities:   Extremities normal, atraumatic, no cyanosis or edema  Pulses:   2+ and symmetric all extremities  Skin:   Skin color, texture, turgor normal, no rashes or lesions  Lymph nodes:   Cervical, supraclavicular, and axillary nodes normal  Neurologic:  Grossly intact.  Partial paralysis of left side (face, upper and lower limb)   .  Labs:  Lab Results  Component Value Date   WBC 6.1 04/17/2016   HGB 12.9 04/17/2016   HCT 38.4 04/17/2016   MCV 88.1 04/17/2016   PLT 326.0 04/17/2016    Lab Results  Component Value Date   CREATININE 0.76 04/17/2016   BUN 10  04/17/2016   NA 140 04/17/2016   K 4.4 04/17/2016   CL 106 04/17/2016   CO2 25 04/17/2016    Lab Results  Component Value Date   ALT 19 04/17/2016   AST 16 04/17/2016   ALKPHOS 61 04/17/2016   BILITOT 0.4 04/17/2016    Lab Results  Component Value Date   TSH 1.66 04/17/2016     Assessment:   Healthy female exam.  Obesity (BMI 35) HTN Contraception management   Plan:     Breast self exam technique reviewed and patient encouraged to perform self-exam monthly. Contraception: Depo-Provera injections.  If patient on Depo for longer than 2 years, will need Dexa Scan next year.  Overall doing well on the injections.  Discussed healthy lifestyle modifications. Pap smear up to date.  Next pap due in 1 year. HTN well controlled today.  Continue current management as recommended by PCP.  Follow up in 1 year.     Rubie Maid, MD [facility]

## 2016-10-31 ENCOUNTER — Ambulatory Visit (INDEPENDENT_AMBULATORY_CARE_PROVIDER_SITE_OTHER): Payer: Medicare Other | Admitting: Obstetrics and Gynecology

## 2016-10-31 VITALS — BP 122/88 | HR 94 | Wt 199.3 lb

## 2016-10-31 DIAGNOSIS — Z3042 Encounter for surveillance of injectable contraceptive: Secondary | ICD-10-CM | POA: Diagnosis not present

## 2016-10-31 MED ORDER — MEDROXYPROGESTERONE ACETATE 150 MG/ML IM SUSP
150.0000 mg | Freq: Once | INTRAMUSCULAR | Status: AC
  Administered 2016-10-31: 150 mg via INTRAMUSCULAR

## 2016-10-31 NOTE — Progress Notes (Signed)
Patient ID: Margaret Zuniga, female   DOB: 1981-01-24, 35 y.o.   MRN: [id number] Pt presents for depo-provera injection. No c/o side effects.

## 2016-11-13 DIAGNOSIS — R51 Headache: Secondary | ICD-10-CM | POA: Diagnosis not present

## 2016-11-13 DIAGNOSIS — M542 Cervicalgia: Secondary | ICD-10-CM | POA: Diagnosis not present

## 2016-11-13 DIAGNOSIS — M791 Myalgia: Secondary | ICD-10-CM | POA: Diagnosis not present

## 2016-11-13 DIAGNOSIS — G43719 Chronic migraine without aura, intractable, without status migrainosus: Secondary | ICD-10-CM | POA: Diagnosis not present

## 2016-11-14 ENCOUNTER — Encounter: Payer: Self-pay | Admitting: Internal Medicine

## 2016-11-14 ENCOUNTER — Ambulatory Visit (INDEPENDENT_AMBULATORY_CARE_PROVIDER_SITE_OTHER): Payer: Medicare Other | Admitting: Internal Medicine

## 2016-11-14 VITALS — BP 124/88 | HR 85 | Wt 200.0 lb

## 2016-11-14 DIAGNOSIS — J4540 Moderate persistent asthma, uncomplicated: Secondary | ICD-10-CM | POA: Diagnosis not present

## 2016-11-14 NOTE — Assessment & Plan Note (Signed)
Patient with negative methacholine challenge test and withconstructional primary function testing, however did have mild reduction in RV and DLCO. High-resolution CAT scan with no significant findings of ILD, but with small airway obstruction.  I believe at this time that the majority of her dyspnea on exertion could be extrinsic asthma or some form of it. Doing well with ICS, Arnuity  Plan: -Cont with Arnuity 00 g, 1 puff daily, gargle and rinse after each use.

## 2016-11-14 NOTE — Patient Instructions (Signed)
Follow up with Dr. Juanell Fairly in 6 months - cont with Arnuity daily - -gargle and rinse after each use.  - diet, exercise, and weight loss as tolerated.  - avoid allergens
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
## 2016-11-27 ENCOUNTER — Encounter: Payer: Self-pay | Admitting: Internal Medicine

## 2016-12-25 DIAGNOSIS — M791 Myalgia: Secondary | ICD-10-CM | POA: Diagnosis not present

## 2016-12-25 DIAGNOSIS — G43719 Chronic migraine without aura, intractable, without status migrainosus: Secondary | ICD-10-CM | POA: Diagnosis not present

## 2016-12-25 DIAGNOSIS — R51 Headache: Secondary | ICD-10-CM | POA: Diagnosis not present

## 2016-12-25 DIAGNOSIS — M542 Cervicalgia: Secondary | ICD-10-CM | POA: Diagnosis not present

## 2017-01-01 ENCOUNTER — Ambulatory Visit (INDEPENDENT_AMBULATORY_CARE_PROVIDER_SITE_OTHER): Payer: Medicare Other | Admitting: Family Medicine

## 2017-01-01 ENCOUNTER — Encounter: Payer: Self-pay | Admitting: Family Medicine

## 2017-01-01 DIAGNOSIS — J209 Acute bronchitis, unspecified: Secondary | ICD-10-CM

## 2017-01-01 MED ORDER — HYDROCOD POLST-CPM POLST ER 10-8 MG/5ML PO SUER: 5.0000 mL | Freq: Two times a day (BID) | ORAL | 0 refills | Status: DC | PRN

## 2017-01-01 MED ORDER — PREDNISONE 50 MG PO TABS: ORAL_TABLET | ORAL | 0 refills | Status: DC

## 2017-01-01 NOTE — Progress Notes (Signed)
Pre visit review using our clinic review tool, if applicable. No additional management support is needed unless otherwise documented below in the visit note. 

## 2017-01-01 NOTE — Progress Notes (Signed)
   Subjective:  Patient ID: Margaret Zuniga, female    DOB: 1981/09/04  Age: 36 y.o. MRN: [id number]  CC: Cough, wheezing  HPI:  36 year old female with a history of asthma presents with the above complaint.  Patient states she's been sick since last Wednesday. Her father and mother also sick. She's had cough, sore throat, wheezing. She is also had associated headache. She states that initially "my skin hurt". She reports associated shortness of breath as well. No fevers or chills. No known exacerbating or relieving factors. No other social symptoms. No other complaints or concerns at this time.  Social Hx   Social History   Social History  . Marital status: Single    Spouse name: N/A  . Number of children: N/A  . Years of education: N/A   Social History Main Topics  . Smoking status: Never Smoker  . Smokeless tobacco: Never Used  . Alcohol use No  . Drug use: No  . Sexual activity: No   Other Topics Concern  . None   Social History Narrative  . None    Review of Systems  Constitutional: Negative for fever.  HENT: Positive for congestion and sore throat.   Respiratory: Positive for cough, shortness of breath and wheezing.   Neurological: Positive for headaches.   Objective:  BP 116/83   Pulse 86   Temp 98.2 F (36.8 C) (Oral)   Resp 14   Wt 198 lb 12.8 oz (90.2 kg)   SpO2 98%   BMI 35.22 kg/m   BP/Weight 01/01/2017 11/14/2016 99991111  Systolic BP [id number]  Diastolic BP 83 88 88  Wt. (Lbs) 198.8 200 199.3  BMI 35.22 35.43 35.3   Physical Exam  Constitutional: She appears well-developed. No distress.  HENT:  Mouth/Throat: Oropharynx is clear and moist.  Cardiovascular: Normal rate and regular rhythm.   Pulmonary/Chest: Effort normal and breath sounds normal. She has no wheezes. She has no rales.  Neurological: She is alert.  Psychiatric: She has a normal mood and affect.  Vitals reviewed.  Lab Results  Component Value Date   WBC 6.1 04/17/2016   HGB  12.9 04/17/2016   HCT 38.4 04/17/2016   PLT 326.0 04/17/2016   GLUCOSE 84 04/17/2016   CHOL 169 04/17/2016   TRIG 110.0 04/17/2016   HDL 53.90 04/17/2016   LDLDIRECT 109.0 04/17/2016   LDLCALC 93 04/17/2016   ALT 19 04/17/2016   AST 16 04/17/2016   NA 140 04/17/2016   K 4.4 04/17/2016   CL 106 04/17/2016   CREATININE 0.76 04/17/2016   BUN 10 04/17/2016   CO2 25 04/17/2016   TSH 1.66 04/17/2016   HGBA1C 5.4 04/17/2016   MICROALBUR <0.7 04/19/2016   Assessment & Plan:   Problem List Items Addressed This Visit    Acute bronchitis    New acute problem. Treating with prednisone and tussionex (especially in setting of asthma).         Meds ordered this encounter  Medications  . predniSONE (DELTASONE) 50 MG tablet    Sig: 1 tablet daily x 5 days.    Dispense:  5 tablet    Refill:  0  . chlorpheniramine-HYDROcodone (TUSSIONEX PENNKINETIC ER) 10-8 MG/5ML SUER    Sig: Take 5 mLs by mouth every 12 (twelve) hours as needed.    Dispense:  115 mL    Refill:  0    Follow-up: PRN  Kenvir

## 2017-01-01 NOTE — Assessment & Plan Note (Signed)
New acute problem. Treating with prednisone and tussionex (especially in setting of asthma).

## 2017-01-01 NOTE — Patient Instructions (Signed)
Prednisone and tussionex as prescribed.  Take care  Dr. Lacinda Axon    Acute Bronchitis, Adult Acute bronchitis is sudden (acute) swelling of the air tubes (bronchi) in the lungs. Acute bronchitis causes these tubes to fill with mucus, which can make it hard to breathe. It can also cause coughing or wheezing. In adults, acute bronchitis usually goes away within 2 weeks. A cough caused by bronchitis may last up to 3 weeks. Smoking, allergies, and asthma can make the condition worse. Repeated episodes of bronchitis may cause further lung problems, such as chronic obstructive pulmonary disease (COPD). What are the causes? This condition can be caused by germs and by substances that irritate the lungs, including:  Cold and flu viruses. This condition is most often caused by the same virus that causes a cold.  Bacteria.  Exposure to tobacco smoke, dust, fumes, and air pollution. What increases the risk? This condition is more likely to develop in people who:  Have close contact with someone with acute bronchitis.  Are exposed to lung irritants, such as tobacco smoke, dust, fumes, and vapors.  Have a weak immune system.  Have a respiratory condition such as asthma. What are the signs or symptoms? Symptoms of this condition include:  A cough.  Coughing up clear, yellow, or green mucus.  Wheezing.  Chest congestion.  Shortness of breath.  A fever.  Body aches.  Chills.  A sore throat. How is this diagnosed? This condition is usually diagnosed with a physical exam. During the exam, your health care provider may order tests, such as chest X-rays, to rule out other conditions. He or she may also:  Test a sample of your mucus for bacterial infection.  Check the level of oxygen in your blood. This is done to check for pneumonia.  Do a chest X-ray or lung function testing to rule out pneumonia and other conditions.  Perform blood tests. Your health care provider will also ask  about your symptoms and medical history. How is this treated? Most cases of acute bronchitis clear up over time without treatment. Your health care provider may recommend:  Drinking more fluids. Drinking more makes your mucus thinner, which may make it easier to breathe.  Taking a medicine for a fever or cough.  Taking an antibiotic medicine.  Using an inhaler to help improve shortness of breath and to control a cough.  Using a cool mist vaporizer or humidifier to make it easier to breathe. Follow these instructions at home: Medicines  Take over-the-counter and prescription medicines only as told by your health care provider.  If you were prescribed an antibiotic, take it as told by your health care provider. Do not stop taking the antibiotic even if you start to feel better. General instructions  Get plenty of rest.  Drink enough fluids to keep your urine clear or pale yellow.  Avoid smoking and secondhand smoke. Exposure to cigarette smoke or irritating chemicals will make bronchitis worse. If you smoke and you need help quitting, ask your health care provider. Quitting smoking will help your lungs heal faster.  Use an inhaler, cool mist vaporizer, or humidifier as told by your health care provider.  Keep all follow-up visits as told by your health care provider. This is important. How is this prevented? To lower your risk of getting this condition again:  Wash your hands often with soap and water. If soap and water are not available, use hand sanitizer.  Avoid contact with people who have cold symptoms.  Try not to touch your hands to your mouth, nose, or eyes.  Make sure to get the flu shot every year. Contact a health care provider if:  Your symptoms do not improve in 2 weeks of treatment. Get help right away if:  You cough up blood.  You have chest pain.  You have severe shortness of breath.  You become dehydrated.  You faint or keep feeling like you are  going to faint.  You keep vomiting.  You have a severe headache.  Your fever or chills gets worse. This information is not intended to replace advice given to you by your health care provider. Make sure you discuss any questions you have with your health care provider. Document Released: 01/04/2005 Document Revised: 06/21/2016 Document Reviewed: 05/17/2016 Elsevier Interactive Patient Education  2017 [employer].

## 2017-01-17 ENCOUNTER — Ambulatory Visit (INDEPENDENT_AMBULATORY_CARE_PROVIDER_SITE_OTHER): Payer: Medicare Other | Admitting: Obstetrics and Gynecology

## 2017-01-17 VITALS — BP 129/95 | HR 90 | Wt 199.0 lb

## 2017-01-17 DIAGNOSIS — Z3042 Encounter for surveillance of injectable contraceptive: Secondary | ICD-10-CM

## 2017-01-17 MED ORDER — MEDROXYPROGESTERONE ACETATE 150 MG/ML IM SUSP
150.0000 mg | Freq: Once | INTRAMUSCULAR | Status: AC
  Administered 2017-01-17: 150 mg via INTRAMUSCULAR

## 2017-01-17 NOTE — Progress Notes (Signed)
Patient ID: Margaret Zuniga, female   DOB: 09/18/1981, 36 y.o.   MRN: [id number] Pt presents for depo-provera injection without any undesirable side effects. 

## 2017-02-05 ENCOUNTER — Other Ambulatory Visit: Payer: Medicare Other

## 2017-02-05 ENCOUNTER — Encounter: Payer: Self-pay | Admitting: Internal Medicine

## 2017-02-05 ENCOUNTER — Ambulatory Visit (INDEPENDENT_AMBULATORY_CARE_PROVIDER_SITE_OTHER): Payer: Medicare Other | Admitting: Internal Medicine

## 2017-02-05 VITALS — BP 142/101 | HR 86 | Temp 97.8°F | Wt 196.8 lb

## 2017-02-05 DIAGNOSIS — R358 Other polyuria: Secondary | ICD-10-CM

## 2017-02-05 DIAGNOSIS — Z79899 Other long term (current) drug therapy: Secondary | ICD-10-CM

## 2017-02-05 DIAGNOSIS — R3589 Other polyuria: Secondary | ICD-10-CM

## 2017-02-05 DIAGNOSIS — J4540 Moderate persistent asthma, uncomplicated: Secondary | ICD-10-CM | POA: Diagnosis not present

## 2017-02-05 DIAGNOSIS — R339 Retention of urine, unspecified: Secondary | ICD-10-CM | POA: Diagnosis not present

## 2017-02-05 DIAGNOSIS — I10 Essential (primary) hypertension: Secondary | ICD-10-CM

## 2017-02-05 DIAGNOSIS — F331 Major depressive disorder, recurrent, moderate: Secondary | ICD-10-CM | POA: Diagnosis not present

## 2017-02-05 NOTE — Patient Instructions (Addendum)
  The new goals for optimal blood pressure management are 120/70.  Please check your blood pressure a few times at home and send me the readings so I can determine if you need a change in medication   Check your morning blood pressure  daily  For the next 5 days and send me the readings   You can bring back a urine sample in the morning if you are unable to produce any tonight

## 2017-02-05 NOTE — Progress Notes (Signed)
Subjective:  Patient ID: Margaret Zuniga, female    DOB: September 17, 1981  Age: 36 y.o. MRN: [id number]  CC: The primary encounter diagnosis was Polyuria. Diagnoses of Long-term use of high-risk medication, Major depressive disorder, recurrent episode, moderate ([facility]), Moderate persistent extrinsic asthma without complication, Essential hypertension, benign, and Urinary retention were also pertinent to this visit.  HPI Margaret Zuniga presents for evaluation of symptoms concerning for UTI . Has been having suprapubic cramping for the past week,  No burning, denies hematuria .  Feels like she is retaining  urine but doesn't produce any on the second trip back to the bathroom    unable to produce urine for testing today.  Last void was 1:30 pm.    No new medications.  Does not drink any caffeinated beverages    Outpatient Medications Prior to Visit  Medication Sig Dispense Refill  . Acetaminophen-Caffeine (EXCEDRIN TENSION HEADACHE) 500-65 MG TABS Take by mouth.    . B Complex-C-Folic Acid (MULTIVITAMIN, STRESS FORMULA) tablet Take 1 tablet by mouth daily.      . baclofen (LIORESAL) 10 MG tablet Take 10 mg by mouth 2 (two) times daily. 2 days a week    . BOOSTRIX 5-2.5-18.5 injection TO BE ADMINISTERED BY PHARMACIST FOR IMMUNIZATION  0  . carbamazepine (TEGRETOL XR) 100 MG 12 hr tablet     . cetirizine (ZYRTEC) 10 MG tablet Take 10 mg by mouth daily.    . chlorpheniramine-HYDROcodone (TUSSIONEX PENNKINETIC ER) 10-8 MG/5ML SUER Take 5 mLs by mouth every 12 (twelve) hours as needed. 115 mL 0  . docusate sodium (COLACE) 100 MG capsule Take 2 capsules (200 mg total) by mouth 2 (two) times daily. 120 capsule 0  . escitalopram (LEXAPRO) 10 MG tablet TAKE 1 TABLET (10 MG TOTAL) BY MOUTH DAILY. 90 tablet 1  . FLUCELVAX QUADRIVALENT 0.5 ML SUSY TO BE ADMINISTERED BY PHARMACIST FOR IMMUNIZATION  0  . Fluticasone Furoate (ARNUITY ELLIPTA) 100 MCG/ACT AEPB Inhale 1 puff into the lungs daily. 30 each 5  .  medroxyPROGESTERone (DEPO-PROVERA) 150 MG/ML injection Inject 1 mL (150 mg total) into the muscle every 3 (three) months. 1 mL 3  . metoprolol succinate (TOPROL-XL) 100 MG 24 hr tablet TAKE 1 TABLET (100 MG TOTAL) BY MOUTH DAILY. TAKE WITH OR IMMEDIATELY FOLLOWING A MEAL. 90 tablet 1  . predniSONE (DELTASONE) 50 MG tablet 1 tablet daily x 5 days. 5 tablet 0  . senna (SENOKOT) 8.6 MG TABS tablet Take 2 tablets (17.2 mg total) by mouth 2 (two) times daily. 120 each 0   No facility-administered medications prior to visit.     Review of Systems;  Patient denies headache, fevers, malaise, unintentional weight loss, skin rash, eye pain, sinus congestion and sinus pain, sore throat, dysphagia,  hemoptysis , cough, dyspnea, wheezing, chest pain, palpitations, orthopnea, edema, abdominal pain, nausea, melena, diarrhea, constipation, flank pain, dysuria, hematuria, urinary  Frequency, nocturia, numbness, tingling, seizures,  Focal weakness, Loss of consciousness,  Tremor, insomnia, depression, anxiety, and suicidal ideation.      Objective:  BP (!) 142/101   Pulse 86   Temp 97.8 F (36.6 C) (Oral)   Wt 196 lb 12.8 oz (89.3 kg)   SpO2 94%   BMI 34.86 kg/m   BP Readings from Last 3 Encounters:  02/05/17 (!) 142/101  01/17/17 (!) 129/95  01/01/17 116/83    Wt Readings from Last 3 Encounters:  02/05/17 196 lb 12.8 oz (89.3 kg)  01/17/17 199 lb (90.3 kg)  01/01/17 198 lb 12.8 oz (90.2 kg)    General appearance: alert, cooperative and appears stated age Ears: normal TM's and external ear canals both ears Throat: lips, mucosa, and tongue normal; teeth and gums normal Neck: no adenopathy, no carotid bruit, supple, symmetrical, trachea midline and thyroid not enlarged, symmetric, no tenderness/mass/nodules Back: symmetric, no curvature. ROM normal. No CVA tenderness. Lungs: clear to auscultation bilaterally Heart: regular rate and rhythm, S1, S2 normal, no murmur, click, rub or  gallop Abdomen: soft, non-tender; bowel sounds normal; no masses,  no organomegaly Pulses: 2+ and symmetric Skin: Skin color, texture, turgor normal. No rashes or lesions Lymph nodes: Cervical, supraclavicular, and axillary nodes normal.  Lab Results  Component Value Date   HGBA1C 5.4 04/17/2016    Lab Results  Component Value Date   CREATININE 0.76 04/17/2016   CREATININE 0.84 07/01/2015   CREATININE 0.70 06/01/2015    Lab Results  Component Value Date   WBC 6.1 04/17/2016   HGB 12.9 04/17/2016   HCT 38.4 04/17/2016   PLT 326.0 04/17/2016   GLUCOSE 84 04/17/2016   CHOL 169 04/17/2016   TRIG 110.0 04/17/2016   HDL 53.90 04/17/2016   LDLDIRECT 109.0 04/17/2016   LDLCALC 93 04/17/2016   ALT 19 04/17/2016   AST 16 04/17/2016   NA 140 04/17/2016   K 4.4 04/17/2016   CL 106 04/17/2016   CREATININE 0.76 04/17/2016   BUN 10 04/17/2016   CO2 25 04/17/2016   TSH 1.66 04/17/2016   HGBA1C 5.4 04/17/2016   MICROALBUR <0.7 04/19/2016    Ct Chest High Resolution  Result Date: 07/05/2016 CLINICAL DATA:  36 year old female with history of pulmonary nodule noted on prior CT examination. Followup study. EXAM: CT CHEST WITHOUT CONTRAST TECHNIQUE: Multidetector CT imaging of the chest was performed following the standard protocol without intravenous contrast. High resolution imaging of the lungs, as well as inspiratory and expiratory imaging, was performed. COMPARISON:  Chest CT 02/19/2015. FINDINGS: Cardiovascular: Heart size is normal. There is no significant pericardial fluid, thickening or pericardial calcification. No atherosclerotic calcifications identified in the thoracic aorta or coronary arteries. Mediastinum/Nodes: No pathologically enlarged mediastinal or hilar lymph nodes. Please note that accurate exclusion of hilar adenopathy is limited on noncontrast CT scans. Esophagus is unremarkable in appearance. No axillary lymphadenopathy. Lungs/Pleura: Previously noted subpleural  nodule in the periphery of the left lower lobe has slightly decreased in size compared to the prior study measuring only 4 mm on today's examination (image 73 of series 10), compatible with a benign subpleural lymph node. No other suspicious appearing pulmonary nodules or masses are noted. There is no acute consolidative airspace disease. No pleural effusions. High-resolution images demonstrate no significant regions of ground-glass attenuation, subpleural reticulation, parenchymal banding, traction bronchiectasis or frank honeycombing. Inspiratory and expiratory imaging demonstrates some mild air trapping, indicative of mild small airways disease. Upper Abdomen: Unremarkable. Musculoskeletal: There are no aggressive appearing lytic or blastic lesions noted in the visualized portions of the skeleton. IMPRESSION: 1. 4 mm subpleural nodule in the periphery of the left lower lobe is compatible with a benign subpleural lymph node. No future imaging followup is recommended. 2. No evidence of interstitial lung disease. 3. Mild air trapping, indicative of mild small airways disease. Electronically Signed   By: Vinnie Langton M.D.   On: 07/05/2016 13:29   Assessment & Plan:   Problem List Items Addressed This Visit    Essential hypertension, benign    Well controlled on current regimen. Renal function stable, no  changes today.  Lab Results  Component Value Date   CREATININE 0.76 04/17/2016   Lab Results  Component Value Date   NA 140 04/17/2016   K 4.4 04/17/2016   CL 106 04/17/2016   CO2 25 04/17/2016         Extrinsic asthma    Recent episode of bronchitis in January managed by dr Lacinda Axon with prednisone taper and cough suppressant .        Major depressive disorder, recurrent episode, moderate (HCC)    Symptoms are managed effectively with lexapro.       Urinary retention    Described by patient.  Unable to rule out UTI as patient could not void,  She has mild suprapubic cramping but her  exam is benign.  She will submit a urine sample and if negative for infection ,  Post void residual will be ordered has seen Urology [facility]) in the past for same.       Other Visit Diagnoses    Polyuria    -  Primary   Relevant Orders   Urine Microscopic Only   Urine Culture   POCT urinalysis dipstick   Long-term use of high-risk medication       Relevant Orders   CBC with Differential/Platelet   Comprehensive metabolic panel      I am having Ms. Zenz maintain her (multivitamin, stress formula), cetirizine, baclofen, docusate sodium, senna, BOOSTRIX, medroxyPROGESTERone, Acetaminophen-Caffeine, Fluticasone Furoate, metoprolol succinate, escitalopram, FLUCELVAX QUADRIVALENT, carbamazepine, predniSONE, and chlorpheniramine-HYDROcodone.  No orders of the defined types were placed in this encounter.   There are no discontinued medications.  Follow-up: No Follow-up on file.   Crecencio Mc, MD

## 2017-02-05 NOTE — Progress Notes (Signed)
Pre visit review using our clinic review tool, if applicable. No additional management support is needed unless otherwise documented below in the visit note. 

## 2017-02-06 ENCOUNTER — Other Ambulatory Visit (INDEPENDENT_AMBULATORY_CARE_PROVIDER_SITE_OTHER): Payer: Medicare Other

## 2017-02-06 DIAGNOSIS — R339 Retention of urine, unspecified: Secondary | ICD-10-CM

## 2017-02-06 DIAGNOSIS — R358 Other polyuria: Secondary | ICD-10-CM

## 2017-02-06 DIAGNOSIS — R3589 Other polyuria: Secondary | ICD-10-CM

## 2017-02-06 LAB — POCT URINALYSIS DIP (MANUAL ENTRY)
Blood, UA: NEGATIVE
Glucose, UA: NEGATIVE
Leukocytes, UA: NEGATIVE
Nitrite, UA: NEGATIVE
Protein Ur, POC: NEGATIVE
Spec Grav, UA: 1.02
Urobilinogen, UA: 0.2
pH, UA: 5

## 2017-02-06 LAB — CBC WITH DIFFERENTIAL/PLATELET
Basophils Absolute: 0.1 10*3/uL (ref 0.0–0.1)
Basophils Relative: 0.9 % (ref 0.0–3.0)
Eosinophils Absolute: 0.1 10*3/uL (ref 0.0–0.7)
Eosinophils Relative: 0.8 % (ref 0.0–5.0)
HCT: 41.4 % (ref 36.0–46.0)
Hemoglobin: 13.8 g/dL (ref 12.0–15.0)
Lymphocytes Relative: 26.5 % (ref 12.0–46.0)
Lymphs Abs: 1.9 10*3/uL (ref 0.7–4.0)
MCHC: 33.3 g/dL (ref 30.0–36.0)
MCV: 90.6 fl (ref 78.0–100.0)
Monocytes Absolute: 0.5 10*3/uL (ref 0.1–1.0)
Monocytes Relative: 6.4 % (ref 3.0–12.0)
Neutro Abs: 4.7 10*3/uL (ref 1.4–7.7)
Neutrophils Relative %: 65.4 % (ref 43.0–77.0)
Platelets: 301 10*3/uL (ref 150.0–400.0)
RBC: 4.57 Mil/uL (ref 3.87–5.11)
RDW: 13.5 % (ref 11.5–15.5)
WBC: 7.1 10*3/uL (ref 4.0–10.5)

## 2017-02-06 LAB — COMPREHENSIVE METABOLIC PANEL
ALT: 83 U/L — ABNORMAL HIGH (ref 0–35)
AST: 61 U/L — ABNORMAL HIGH (ref 0–37)
Albumin: 4.6 g/dL (ref 3.5–5.2)
Alkaline Phosphatase: 94 U/L (ref 39–117)
BUN: 15 mg/dL (ref 6–23)
CO2: 24 mEq/L (ref 19–32)
Calcium: 9.5 mg/dL (ref 8.4–10.5)
Chloride: 108 mEq/L (ref 96–112)
Creatinine, Ser: 0.81 mg/dL (ref 0.40–1.20)
GFR: 84.05 mL/min (ref >60.00)
Glucose, Bld: 87 mg/dL (ref 70–99)
Potassium: 4.1 mEq/L (ref 3.5–5.1)
Sodium: 141 mEq/L (ref 135–145)
Total Bilirubin: 0.3 mg/dL (ref 0.2–1.2)
Total Protein: 7.3 g/dL (ref 6.0–8.3)

## 2017-02-06 LAB — URINALYSIS, MICROSCOPIC ONLY: WBC, UA: NONE SEEN (ref 0–?)

## 2017-02-06 NOTE — Assessment & Plan Note (Signed)
Symptoms are managed effectively with lexapro.

## 2017-02-06 NOTE — Assessment & Plan Note (Signed)
Recent episode of bronchitis in January managed by dr cook with prednisone taper and cough suppressant .

## 2017-02-06 NOTE — Assessment & Plan Note (Signed)
Well controlled on current regimen. Renal function stable, no changes today.  Lab Results  Component Value Date   CREATININE 0.76 04/17/2016   Lab Results  Component Value Date   NA 140 04/17/2016   K 4.4 04/17/2016   CL 106 04/17/2016   CO2 25 04/17/2016

## 2017-02-06 NOTE — Assessment & Plan Note (Signed)
Described by patient.  Unable to rule out UTI as patient could not void,  She has mild suprapubic cramping but her exam is benign.  She will submit a urine sample and if negative for infection ,  Post void residual will be ordered has seen Urology [facility]) in the past for same.

## 2017-02-07 LAB — URINE CULTURE: Organism ID, Bacteria: NO GROWTH

## 2017-02-07 NOTE — Addendum Note (Signed)
Addended by: Crecencio Mc on: 02/07/2017 01:14 PM   Modules accepted: Orders

## 2017-02-08 ENCOUNTER — Telehealth: Payer: Self-pay

## 2017-02-08 ENCOUNTER — Other Ambulatory Visit: Payer: Self-pay | Admitting: Internal Medicine

## 2017-02-08 ENCOUNTER — Encounter: Payer: Self-pay | Admitting: Internal Medicine

## 2017-02-08 DIAGNOSIS — R51 Headache: Secondary | ICD-10-CM | POA: Diagnosis not present

## 2017-02-08 DIAGNOSIS — R748 Abnormal levels of other serum enzymes: Secondary | ICD-10-CM

## 2017-02-08 DIAGNOSIS — M791 Myalgia: Secondary | ICD-10-CM | POA: Diagnosis not present

## 2017-02-08 DIAGNOSIS — G43719 Chronic migraine without aura, intractable, without status migrainosus: Secondary | ICD-10-CM | POA: Diagnosis not present

## 2017-02-08 DIAGNOSIS — M542 Cervicalgia: Secondary | ICD-10-CM | POA: Diagnosis not present

## 2017-02-08 NOTE — Telephone Encounter (Signed)
error 

## 2017-02-08 NOTE — Telephone Encounter (Signed)
Spoke with pt and informed her that her urine culture came back showing no sign of a UTI but that Dr. Derrel Nip did go ahead and order a ultrasound to make sure her bladder was not retaining urine. Explained to pt that Melissa from our  Office will be giving her a call with the appt date and time.

## 2017-02-08 NOTE — Telephone Encounter (Signed)
-----   Message from Crecencio Mc, MD sent at 02/07/2017  1:15 PM EST ----- There is no evidence of UTI by culture results.   I am ordering the "post void residual" ultrasound to see if her bladder is retaining urine . Melissa will call her with the appointment

## 2017-02-08 NOTE — Telephone Encounter (Signed)
LMTCB

## 2017-02-10 ENCOUNTER — Encounter: Payer: Self-pay | Admitting: Internal Medicine

## 2017-02-12 MED ORDER — AMLODIPINE BESYLATE 2.5 MG PO TABS: 2.5000 mg | ORAL_TABLET | Freq: Every day | ORAL | 3 refills | Status: DC

## 2017-02-16 ENCOUNTER — Ambulatory Visit
Admission: RE | Admit: 2017-02-16 | Discharge: 2017-02-16 | Disposition: A | Discharge status: Home / Self Care | Payer: Medicare Other | Source: Ambulatory Visit | Attending: Internal Medicine | Admitting: Internal Medicine

## 2017-02-16 DIAGNOSIS — R339 Retention of urine, unspecified: Secondary | ICD-10-CM | POA: Insufficient documentation

## 2017-02-16 DIAGNOSIS — N3289 Other specified disorders of bladder: Secondary | ICD-10-CM | POA: Diagnosis not present

## 2017-02-19 ENCOUNTER — Other Ambulatory Visit: Payer: Self-pay | Admitting: *Deleted

## 2017-02-19 ENCOUNTER — Other Ambulatory Visit: Payer: Medicare Other

## 2017-02-19 MED ORDER — FLUTICASONE FUROATE 100 MCG/ACT IN AEPB: 1.0000 | INHALATION_SPRAY | Freq: Every day | RESPIRATORY_TRACT | 5 refills | Status: DC

## 2017-02-20 ENCOUNTER — Other Ambulatory Visit (INDEPENDENT_AMBULATORY_CARE_PROVIDER_SITE_OTHER): Payer: Medicare Other

## 2017-02-20 DIAGNOSIS — R748 Abnormal levels of other serum enzymes: Secondary | ICD-10-CM | POA: Diagnosis not present

## 2017-02-20 LAB — IRON AND TIBC
%SAT: 48 % (ref 11–50)
Iron: 155 ug/dL (ref 40–190)
TIBC: 321 ug/dL (ref 250–450)
UIBC: 166 ug/dL (ref 125–400)

## 2017-02-20 LAB — HEPATIC FUNCTION PANEL
ALT: 39 U/L — ABNORMAL HIGH (ref 0–35)
AST: 16 U/L (ref 0–37)
Albumin: 4.5 g/dL (ref 3.5–5.2)
Alkaline Phosphatase: 79 U/L (ref 39–117)
Bilirubin, Direct: 0 mg/dL (ref 0.0–0.3)
Total Bilirubin: 0.4 mg/dL (ref 0.2–1.2)
Total Protein: 7.1 g/dL (ref 6.0–8.3)

## 2017-02-20 LAB — FERRITIN: Ferritin: 49.7 ng/mL (ref 10.0–291.0)

## 2017-02-21 LAB — HEPATITIS C ANTIBODY: HCV Ab: NEGATIVE

## 2017-02-21 LAB — HEPATITIS B CORE ANTIBODY, TOTAL: Hep B Core Total Ab: NONREACTIVE

## 2017-02-21 LAB — HEPATITIS B SURFACE ANTIGEN: Hepatitis B Surface Ag: NEGATIVE

## 2017-02-21 LAB — ANA: Anti Nuclear Antibody(ANA): POSITIVE — AB

## 2017-02-21 LAB — ANTI-SMITH ANTIBODY: ENA SM Ab Ser-aCnc: <1

## 2017-02-21 LAB — ANTI-NUCLEAR AB-TITER (ANA TITER): ANA Titer 1: 1:160 {titer} — ABNORMAL HIGH

## 2017-02-21 LAB — HEPATITIS B SURFACE ANTIBODY,QUALITATIVE

## 2017-02-24 ENCOUNTER — Other Ambulatory Visit: Payer: Self-pay | Admitting: Internal Medicine

## 2017-02-25 ENCOUNTER — Encounter: Payer: Self-pay | Admitting: Internal Medicine

## 2017-02-25 ENCOUNTER — Other Ambulatory Visit: Payer: Self-pay | Admitting: Internal Medicine

## 2017-02-25 DIAGNOSIS — R768 Other specified abnormal immunological findings in serum: Secondary | ICD-10-CM

## 2017-02-25 DIAGNOSIS — R748 Abnormal levels of other serum enzymes: Secondary | ICD-10-CM

## 2017-03-20 ENCOUNTER — Other Ambulatory Visit: Payer: Self-pay | Admitting: Obstetrics and Gynecology

## 2017-03-20 DIAGNOSIS — Z30013 Encounter for initial prescription of injectable contraceptive: Secondary | ICD-10-CM

## 2017-03-22 DIAGNOSIS — G43719 Chronic migraine without aura, intractable, without status migrainosus: Secondary | ICD-10-CM | POA: Diagnosis not present

## 2017-03-22 DIAGNOSIS — R51 Headache: Secondary | ICD-10-CM | POA: Diagnosis not present

## 2017-03-22 DIAGNOSIS — M542 Cervicalgia: Secondary | ICD-10-CM | POA: Diagnosis not present

## 2017-03-22 DIAGNOSIS — M791 Myalgia: Secondary | ICD-10-CM | POA: Diagnosis not present

## 2017-04-04 ENCOUNTER — Ambulatory Visit: Payer: Medicare Other

## 2017-04-05 ENCOUNTER — Other Ambulatory Visit: Payer: Self-pay | Admitting: Gastroenterology

## 2017-04-05 DIAGNOSIS — R7989 Other specified abnormal findings of blood chemistry: Secondary | ICD-10-CM | POA: Diagnosis not present

## 2017-04-05 DIAGNOSIS — R945 Abnormal results of liver function studies: Principal | ICD-10-CM

## 2017-04-09 ENCOUNTER — Ambulatory Visit (INDEPENDENT_AMBULATORY_CARE_PROVIDER_SITE_OTHER): Payer: Medicare Other | Admitting: Obstetrics and Gynecology

## 2017-04-09 VITALS — BP 118/87 | HR 73 | Wt 197.0 lb

## 2017-04-09 DIAGNOSIS — Z3042 Encounter for surveillance of injectable contraceptive: Secondary | ICD-10-CM | POA: Diagnosis not present

## 2017-04-09 MED ORDER — MEDROXYPROGESTERONE ACETATE 150 MG/ML IM SUSP
150.0000 mg | Freq: Once | INTRAMUSCULAR | Status: AC
  Administered 2017-04-09: 150 mg via INTRAMUSCULAR

## 2017-04-09 NOTE — Progress Notes (Signed)
Patient ID: Margaret Zuniga, female   DOB: 05/18/1981, 36 y.o.   MRN: [id number] Pt presents for depo-provera injection without any undesirable side effects. 

## 2017-04-10 ENCOUNTER — Encounter: Payer: Self-pay | Admitting: Internal Medicine

## 2017-04-10 ENCOUNTER — Ambulatory Visit
Admission: RE | Admit: 2017-04-10 | Discharge: 2017-04-10 | Disposition: A | Discharge status: Home / Self Care | Payer: Medicare Other | Source: Ambulatory Visit | Attending: Gastroenterology | Admitting: Gastroenterology

## 2017-04-10 ENCOUNTER — Ambulatory Visit (INDEPENDENT_AMBULATORY_CARE_PROVIDER_SITE_OTHER): Payer: Medicare Other | Admitting: Internal Medicine

## 2017-04-10 VITALS — BP 116/80 | HR 88 | Temp 98.0°F | Resp 16 | Ht 63.0 in | Wt 196.4 lb

## 2017-04-10 DIAGNOSIS — R7301 Impaired fasting glucose: Secondary | ICD-10-CM | POA: Diagnosis not present

## 2017-04-10 DIAGNOSIS — E669 Obesity, unspecified: Secondary | ICD-10-CM

## 2017-04-10 DIAGNOSIS — R945 Abnormal results of liver function studies: Secondary | ICD-10-CM

## 2017-04-10 DIAGNOSIS — R399 Unspecified symptoms and signs involving the genitourinary system: Secondary | ICD-10-CM | POA: Diagnosis not present

## 2017-04-10 DIAGNOSIS — N200 Calculus of kidney: Secondary | ICD-10-CM | POA: Diagnosis not present

## 2017-04-10 DIAGNOSIS — E66811 Obesity, class 1: Secondary | ICD-10-CM

## 2017-04-10 DIAGNOSIS — I10 Essential (primary) hypertension: Secondary | ICD-10-CM

## 2017-04-10 DIAGNOSIS — R7989 Other specified abnormal findings of blood chemistry: Secondary | ICD-10-CM

## 2017-04-10 DIAGNOSIS — R748 Abnormal levels of other serum enzymes: Secondary | ICD-10-CM

## 2017-04-10 NOTE — Patient Instructions (Signed)
Your blood pressure is well controlled on amlodipine 2.5 daily   I'll see you in 6 months

## 2017-04-10 NOTE — Progress Notes (Signed)
Pre visit review using our clinic review tool, if applicable. No additional management support is needed unless otherwise documented below in the visit note. 

## 2017-04-10 NOTE — Progress Notes (Signed)
Subjective:  Patient ID: Margaret Zuniga, female    DOB: Apr 17, 1981  Age: 35 y.o. MRN: [id number]  CC: The primary encounter diagnosis was Impaired fasting glucose. Diagnoses of Essential hypertension, Lower urinary tract symptoms, Essential hypertension, benign, Obesity (BMI 30.0-34.9), and Elevated liver enzymes were also pertinent to this visit.  HPI Margaret Zuniga presents for follow up on multiple issues:  Hypertension: managed with amlodipine, and symptoms of urinary retention last evaluated in late February with a PVR  .   Urinary symptoms improved but not resolved.  Pre void volume was estimated  At 27 ml  Post void  Low at 6  , which patient reported as feeling very full.   Elevated lfts with positive ANA noted  at last visit,  Gi referral made.  eval for autoimmune hepatitis and fatty  liver eval underway,  elastrograph done today and apparently normal.   Urinary symptoms improved but not resolved.  STILL HAVING URINARY FREQUENCY .  SOME DAYS LARGER VOLUMES THAN OTHERS,  FREQUENCY VARIES.  Pre void volume was estimated  At 27 ml  Post void  Low at 6  , which patient reported as feeling very full. .   Tolerating  Amlodipine  without fluid retention .   Obesity: since she has been  Unemployed .  [employer] and [employer] for exercise.   Only once or twice a week.  Has lost   5 lbs .  Has increased vegetable intake and less  Meat .  No junk snacks .  Eating 3 meals daily    Last mammogram 2015 (baseline)   Not sleeping well due to headaches. Seeing Dr Domingo Cocking for headaches    Outpatient Medications Prior to Visit  Medication Sig Dispense Refill  . Acetaminophen-Caffeine (EXCEDRIN TENSION HEADACHE) 500-65 MG TABS Take by mouth.    Marland Kitchen amLODipine (NORVASC) 2.5 MG tablet Take 1 tablet (2.5 mg total) by mouth daily. 90 tablet 3  . B Complex-C-Folic Acid (MULTIVITAMIN, STRESS FORMULA) tablet Take 1 tablet by mouth daily.      . baclofen (LIORESAL) 10 MG tablet Take 10 mg by mouth  2 (two) times daily. 2 days a week    . BOOSTRIX 5-2.5-18.5 injection TO BE ADMINISTERED BY PHARMACIST FOR IMMUNIZATION  0  . cetirizine (ZYRTEC) 10 MG tablet Take 10 mg by mouth daily.    Marland Kitchen docusate sodium (COLACE) 100 MG capsule Take 2 capsules (200 mg total) by mouth 2 (two) times daily. 120 capsule 0  . escitalopram (LEXAPRO) 10 MG tablet TAKE 1 TABLET (10 MG TOTAL) BY MOUTH DAILY. 90 tablet 1  . Fluticasone Furoate (ARNUITY ELLIPTA) 100 MCG/ACT AEPB Inhale 1 puff into the lungs daily. 30 each 5  . MedroxyPROGESTERone Acetate 150 MG/ML SUSY INJECT 1 ML (150 MG TOTAL) INTO THE MUSCLE EVERY 3 (THREE) MONTHS. 1 Syringe 3  . metoprolol succinate (TOPROL-XL) 100 MG 24 hr tablet TAKE 1 TABLET (100 MG TOTAL) BY MOUTH DAILY. TAKE WITH OR IMMEDIATELY FOLLOWING A MEAL. 90 tablet 1  . senna (SENOKOT) 8.6 MG TABS tablet Take 2 tablets (17.2 mg total) by mouth 2 (two) times daily. 120 each 0  . carbamazepine (TEGRETOL XR) 100 MG 12 hr tablet Take 300 mg by mouth 3 (three) times daily.     . chlorpheniramine-HYDROcodone (TUSSIONEX PENNKINETIC ER) 10-8 MG/5ML SUER Take 5 mLs by mouth every 12 (twelve) hours as needed. (Patient not taking: Reported on 04/10/2017) 115 mL 0  . FLUCELVAX QUADRIVALENT  0.5 ML SUSY TO BE ADMINISTERED BY PHARMACIST FOR IMMUNIZATION  0  . predniSONE (DELTASONE) 50 MG tablet 1 tablet daily x 5 days. (Patient not taking: Reported on 04/10/2017) 5 tablet 0   No facility-administered medications prior to visit.     Review of Systems;  Patient denies headache, fevers, malaise, unintentional weight loss, skin rash, eye pain, sinus congestion and sinus pain, sore throat, dysphagia,  hemoptysis , cough, dyspnea, wheezing, chest pain, palpitations, orthopnea, edema, abdominal pain, nausea, melena, diarrhea, constipation, flank pain, dysuria, hematuria, urinary  Frequency, nocturia, numbness, tingling, seizures,  Focal weakness, Loss of consciousness,  Tremor, insomnia, depression, anxiety, and  suicidal ideation.      Objective:  BP 116/80 (BP Location: Left Arm, Patient Position: Sitting, Cuff Size: Large)   Pulse 88   Temp 98 F (36.7 C) (Oral)   Resp 16   Ht 5\' 3"  (1.6 m)   Wt 196 lb 6.4 oz (89.1 kg)   SpO2 97%   BMI 34.79 kg/m   BP Readings from Last 3 Encounters:  04/10/17 116/80  04/09/17 118/87  02/05/17 (!) 142/101    Wt Readings from Last 3 Encounters:  04/10/17 196 lb 6.4 oz (89.1 kg)  04/09/17 197 lb (89.4 kg)  02/05/17 196 lb 12.8 oz (89.3 kg)    General appearance: alert, cooperative and appears stated age Ears: normal TM's and external ear canals both ears Throat: lips, mucosa, and tongue normal; teeth and gums normal Neck: no adenopathy, no carotid bruit, supple, symmetrical, trachea midline and thyroid not enlarged, symmetric, no tenderness/mass/nodules Back: symmetric, no curvature. ROM normal. No CVA tenderness. Lungs: clear to auscultation bilaterally Heart: regular rate and rhythm, S1, S2 normal, no murmur, click, rub or gallop Abdomen: soft, non-tender; bowel sounds normal; no masses,  no organomegaly Pulses: 2+ and symmetric Skin: Skin color, texture, turgor normal. No rashes or lesions Lymph nodes: Cervical, supraclavicular, and axillary nodes normal.  Lab Results  Component Value Date   HGBA1C 5.4 04/17/2016    Lab Results  Component Value Date   CREATININE 0.81 02/05/2017   CREATININE 0.76 04/17/2016   CREATININE 0.84 07/01/2015    Lab Results  Component Value Date   WBC 7.1 02/05/2017   HGB 13.8 02/05/2017   HCT 41.4 02/05/2017   PLT 301.0 02/05/2017   GLUCOSE 87 02/05/2017   CHOL 169 04/17/2016   TRIG 110.0 04/17/2016   HDL 53.90 04/17/2016   LDLDIRECT 109.0 04/17/2016   LDLCALC 93 04/17/2016   ALT 39 (H) 02/20/2017   AST 16 02/20/2017   NA 141 02/05/2017   K 4.1 02/05/2017   CL 108 02/05/2017   CREATININE 0.81 02/05/2017   BUN 15 02/05/2017   CO2 24 02/05/2017   TSH 1.66 04/17/2016   HGBA1C 5.4 04/17/2016     MICROALBUR <0.7 04/19/2016    US Abdomen Complete W/elastography  Result Date: 04/10/2017 CLINICAL DATA:  Elevated LFTs EXAM: ULTRASOUND ABDOMEN ULTRASOUND HEPATIC ELASTOGRAPHY TECHNIQUE: Sonography of the upper abdomen was performed. In addition, ultrasound elastography evaluation of the liver was performed. A region of interest was placed within the right lobe of the liver. Following application of a compressive sonographic pulse, shear waves were detected in the adjacent hepatic tissue and the shear wave velocity was calculated. Multiple assessments were performed at the selected site. Median shear wave velocity is correlated to a Metavir fibrosis score. COMPARISON:  None. FINDINGS: ULTRASOUND ABDOMEN Gallbladder: No gallstones, gallbladder wall thickening, or pericholecystic fluid. Common bile duct: Diameter: 4 mm Liver:  At the upper limits of normal for parenchymal echogenicity. No focal hepatic lesion is seen. IVC: No abnormality visualized. Pancreas: Visualized portion unremarkable. Spleen: Size and appearance within normal limits. Right Kidney: Length: 10.8 cm. 4 mm upper pole calculus. No hydronephrosis. Left Kidney: Length: 9.4 cm. 7 mm interpolar calculus. No hydronephrosis. Abdominal aorta: No aneurysm visualized. Other findings: None. ULTRASOUND HEPATIC ELASTOGRAPHY Device: Siemens Helix VTQ Patient position: Left Lateral Decubitus Transducer 6C1 Number of measurements: 10 Hepatic segment:  8 Median velocity:   0.65  m/sec IQR: 0.11 IQR/Median velocity ratio: 0.17 Corresponding Metavir fibrosis score:  F0/F1 Risk of fibrosis: Minimal Limitations of exam: Difficulty pausing respiration/hepatic motion. Pertinent findings noted on other imaging exams:  None Please note that abnormal shear wave velocities may also be identified in clinical settings other than with hepatic fibrosis, such as: acute hepatitis, elevated right heart and central venous pressures including use of beta blockers,  veno-occlusive disease (Budd-Chiari), infiltrative processes such as mastocytosis/amyloidosis/infiltrative tumor, extrahepatic cholestasis, in the post-prandial state, and liver transplantation. Correlation with patient history, laboratory data, and clinical condition recommended. IMPRESSION: ULTRASOUND ABDOMEN: Bilateral nonobstructing renal calculi, measuring up to 7 mm. No hydronephrosis. ULTRASOUND HEPATIC ELASTOGRAPHY: Median hepatic shear wave velocity is calculated at 0.65 m/sec. Corresponding Metavir fibrosis score is  F0/F1. Risk of fibrosis is Minimal. Follow-up: None required Electronically Signed   By: Julian Hy M.D.   On: 04/10/2017 09:51    Assessment & Plan:   Problem List Items Addressed This Visit    Elevated liver enzymes    Improving,  But positive ANA may be suggestive of autoimmune hepatitis.  Workup by GI pending       Essential hypertension, benign    Well controlled on current regimen. Renal function stable, no changes today.  Lab Results  Component Value Date   CREATININE 0.81 02/05/2017   Lab Results  Component Value Date   NA 141 02/05/2017   K 4.1 02/05/2017   CL 108 02/05/2017   CO2 24 02/05/2017         Lower urinary tract symptoms    No evidence of urinary retention by post void residual done recently.  Symptoms described as bladder fullness have improved.       Obesity (BMI 30.0-34.9)    I have congratulated her in her weight loss and encouraged  Continued weight loss with goal of 10% of body weigh over the next 6 months using a low glycemic index diet and regular exercise a minimum of 5 days per week.         Other Visit Diagnoses    Impaired fasting glucose    -  Primary   Relevant Orders   Hemoglobin A1c   Essential hypertension       Relevant Orders   Lipid panel   Comprehensive metabolic panel     A total of 25 minutes of face to face time was spent with patient more than half of which was spent in counselling and  coordination of care   I have discontinued Ms. Wisser's FLUCELVAX QUADRIVALENT, predniSONE, and chlorpheniramine-HYDROcodone. I am also having her maintain her (multivitamin, stress formula), cetirizine, baclofen, docusate sodium, senna, BOOSTRIX, Acetaminophen-Caffeine, amLODipine, Fluticasone Furoate, escitalopram, metoprolol succinate, MedroxyPROGESTERone Acetate, carbamazepine, and Melatonin.  Meds ordered this encounter  Medications  . carbamazepine (CARBATROL) 300 MG 12 hr capsule    Sig: TAKE 1 CAPSULE BY ORAL ROUTE 3 TIMES A DAY FOR 30 DAYS    Refill:  1  . Melatonin 5 MG  TABS    Sig: Take by mouth.    Medications Discontinued During This Encounter  Medication Reason  . chlorpheniramine-HYDROcodone (TUSSIONEX PENNKINETIC ER) 10-8 MG/5ML SUER Therapy completed  . FLUCELVAX QUADRIVALENT 0.5 ML SUSY Therapy completed  . predniSONE (DELTASONE) 50 MG tablet Therapy completed  . carbamazepine (TEGRETOL XR) 100 MG 12 hr tablet Patient has not taken in last 30 days    Follow-up: Return in about 6 months (around 10/11/2017), or hypertension, fasting labs prior .   Crecencio Mc, MD

## 2017-04-11 DIAGNOSIS — E66811 Obesity, class 1: Secondary | ICD-10-CM

## 2017-04-11 DIAGNOSIS — E669 Obesity, unspecified: Secondary | ICD-10-CM | POA: Insufficient documentation

## 2017-04-11 DIAGNOSIS — E66812 Obesity, class 2: Secondary | ICD-10-CM | POA: Insufficient documentation

## 2017-04-11 DIAGNOSIS — R748 Abnormal levels of other serum enzymes: Secondary | ICD-10-CM | POA: Insufficient documentation

## 2017-04-11 HISTORY — DX: Obesity, class 1: E66.811

## 2017-04-11 HISTORY — DX: Obesity, unspecified: E66.9

## 2017-04-11 NOTE — Assessment & Plan Note (Signed)
Well controlled on current regimen. Renal function stable, no changes today.  Lab Results  Component Value Date   CREATININE 0.81 02/05/2017   Lab Results  Component Value Date   NA 141 02/05/2017   K 4.1 02/05/2017   CL 108 02/05/2017   CO2 24 02/05/2017

## 2017-04-11 NOTE — Assessment & Plan Note (Signed)
Improving,  But positive ANA may be suggestive of autoimmune hepatitis.  Workup by GI pending

## 2017-04-11 NOTE — Assessment & Plan Note (Signed)
I have congratulated her in her weight loss and encouraged  Continued weight loss with goal of 10% of body weigh over the next 6 months using a low glycemic index diet and regular exercise a minimum of 5 days per week.

## 2017-04-11 NOTE — Assessment & Plan Note (Signed)
No evidence of urinary retention by post void residual done recently.  Symptoms described as bladder fullness have improved.

## 2017-05-14 DIAGNOSIS — M791 Myalgia: Secondary | ICD-10-CM | POA: Diagnosis not present

## 2017-05-14 DIAGNOSIS — R51 Headache: Secondary | ICD-10-CM | POA: Diagnosis not present

## 2017-05-14 DIAGNOSIS — M542 Cervicalgia: Secondary | ICD-10-CM | POA: Diagnosis not present

## 2017-05-14 DIAGNOSIS — G43719 Chronic migraine without aura, intractable, without status migrainosus: Secondary | ICD-10-CM | POA: Diagnosis not present

## 2017-05-31 DIAGNOSIS — R748 Abnormal levels of other serum enzymes: Secondary | ICD-10-CM | POA: Diagnosis not present
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
## 2017-06-04 ENCOUNTER — Telehealth: Payer: Self-pay | Admitting: Internal Medicine

## 2017-06-04 ENCOUNTER — Encounter: Payer: Self-pay | Admitting: Internal Medicine

## 2017-06-04 ENCOUNTER — Ambulatory Visit (INDEPENDENT_AMBULATORY_CARE_PROVIDER_SITE_OTHER): Payer: Medicare Other | Admitting: Internal Medicine

## 2017-06-04 VITALS — BP 130/80 | HR 72 | Resp 16 | Ht 63.0 in | Wt 196.0 lb

## 2017-06-04 DIAGNOSIS — J4540 Moderate persistent asthma, uncomplicated: Secondary | ICD-10-CM | POA: Diagnosis not present

## 2017-06-04 MED ORDER — ALBUTEROL SULFATE HFA 108 (90 BASE) MCG/ACT IN AERS: 2.0000 | INHALATION_SPRAY | Freq: Four times a day (QID) | RESPIRATORY_TRACT | 2 refills | Status: DC | PRN

## 2017-06-04 MED ORDER — ALBUTEROL SULFATE HFA 108 (90 BASE) MCG/ACT IN AERS: 2.0000 | INHALATION_SPRAY | RESPIRATORY_TRACT | Status: DC | PRN

## 2017-06-04 NOTE — Telephone Encounter (Signed)
Albuterol refilled.Margaret Zuniga

## 2017-06-04 NOTE — Telephone Encounter (Signed)
Patient was told to keep albuterol with her incase she needs it but does not have rx please send to [facility]

## 2017-06-04 NOTE — Patient Instructions (Signed)
Can use 2 puffs albuterol inhaler if you are short winded, or will do something strenuous that will make you short winded.   Take your albuterol inhaler in your purse, carry it with you at all times.

## 2017-06-19 ENCOUNTER — Emergency Department: Payer: Medicare Other

## 2017-06-19 ENCOUNTER — Emergency Department
Admission: EM | Admit: 2017-06-19 | Discharge: 2017-06-19 | Disposition: A | Discharge status: Home / Self Care | Payer: Medicare Other | Attending: Emergency Medicine | Admitting: Emergency Medicine

## 2017-06-19 ENCOUNTER — Telehealth: Payer: Self-pay | Admitting: *Deleted

## 2017-06-19 ENCOUNTER — Telehealth: Payer: Self-pay | Admitting: Internal Medicine

## 2017-06-19 DIAGNOSIS — I10 Essential (primary) hypertension: Secondary | ICD-10-CM | POA: Diagnosis not present

## 2017-06-19 DIAGNOSIS — G8929 Other chronic pain: Secondary | ICD-10-CM | POA: Diagnosis not present

## 2017-06-19 DIAGNOSIS — R51 Headache: Secondary | ICD-10-CM | POA: Insufficient documentation

## 2017-06-19 DIAGNOSIS — J45909 Unspecified asthma, uncomplicated: Secondary | ICD-10-CM | POA: Diagnosis not present

## 2017-06-19 DIAGNOSIS — R079 Chest pain, unspecified: Secondary | ICD-10-CM | POA: Diagnosis not present

## 2017-06-19 DIAGNOSIS — Z79899 Other long term (current) drug therapy: Secondary | ICD-10-CM | POA: Insufficient documentation

## 2017-06-19 DIAGNOSIS — R0789 Other chest pain: Secondary | ICD-10-CM

## 2017-06-19 DIAGNOSIS — R519 Headache, unspecified: Secondary | ICD-10-CM

## 2017-06-19 LAB — CBC
HCT: 40.5 % (ref 35.0–47.0)
Hemoglobin: 13.8 g/dL (ref 12.0–16.0)
MCH: 30.6 pg (ref 26.0–34.0)
MCHC: 34 g/dL (ref 32.0–36.0)
MCV: 90.1 fL (ref 80.0–100.0)
Platelets: 265 10*3/uL (ref 150–440)
RBC: 4.49 MIL/uL (ref 3.80–5.20)
RDW: 13.5 % (ref 11.5–14.5)
WBC: 10.4 10*3/uL (ref 3.6–11.0)

## 2017-06-19 LAB — BASIC METABOLIC PANEL
Anion gap: 8 (ref 5–15)
BUN: 14 mg/dL (ref 6–20)
CO2: 25 mmol/L (ref 22–32)
Calcium: 9.6 mg/dL (ref 8.9–10.3)
Chloride: 108 mmol/L (ref 101–111)
Creatinine, Ser: 0.82 mg/dL (ref 0.44–1.00)
GFR calc Af Amer: >60 mL/min (ref >60)
GFR calc non Af Amer: >60 mL/min (ref >60)
Glucose, Bld: 114 mg/dL — ABNORMAL HIGH (ref 65–99)
Potassium: 3.9 mmol/L (ref 3.5–5.1)
Sodium: 141 mmol/L (ref 135–145)

## 2017-06-19 LAB — TROPONIN I: Troponin I: <0.03 ng/mL (ref <0.03)

## 2017-06-19 MED ORDER — DIPHENHYDRAMINE HCL 25 MG PO CAPS
25.0000 mg | ORAL_CAPSULE | Freq: Once | ORAL | Status: AC
  Administered 2017-06-19: 25 mg via ORAL
  Filled 2017-06-19: qty 1

## 2017-06-19 MED ORDER — METOCLOPRAMIDE HCL 10 MG PO TABS
10.0000 mg | ORAL_TABLET | Freq: Once | ORAL | Status: AC
  Administered 2017-06-19: 10 mg via ORAL
  Filled 2017-06-19: qty 1

## 2017-06-19 MED ORDER — METOCLOPRAMIDE HCL 10 MG PO TABS: 10.0000 mg | ORAL_TABLET | Freq: Four times a day (QID) | ORAL | 0 refills | Status: DC | PRN

## 2017-06-19 MED ORDER — DIPHENHYDRAMINE HCL 25 MG PO CAPS: 50.0000 mg | ORAL_CAPSULE | Freq: Four times a day (QID) | ORAL | 0 refills | Status: DC | PRN

## 2017-06-19 NOTE — Telephone Encounter (Signed)
Patient stated that she feels her heat rate is racing. She reported having blood pressure of 147/106 heart rate of 98 . *pt transferred to [facility]

## 2017-06-19 NOTE — ED Notes (Signed)
Pt's family here to take pt home.

## 2017-06-19 NOTE — ED Triage Notes (Signed)
Pt c/o having sudden onset chest pain with SOB since 2pm today. Pt skin ia warm and dry. In NAD at present. Pt also c/o migraine all week.
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
## 2017-06-19 NOTE — Telephone Encounter (Signed)
FYI

## 2017-06-19 NOTE — Discharge Instructions (Signed)
Results for orders placed or performed during the hospital encounter of 80/03/49  Basic metabolic panel  Result Value Ref Range   Sodium 141 135 - 145 mmol/L   Potassium 3.9 3.5 - 5.1 mmol/L   Chloride 108 101 - 111 mmol/L   CO2 25 22 - 32 mmol/L   Glucose, Bld 114 (H) 65 - 99 mg/dL   BUN 14 6 - 20 mg/dL   Creatinine, Ser 0.82 0.44 - 1.00 mg/dL   Calcium 9.6 8.9 - 10.3 mg/dL   GFR calc non Af Amer >60 >60 mL/min   GFR calc Af Amer >60 >60 mL/min   Anion gap 8 5 - 15  CBC  Result Value Ref Range   WBC 10.4 3.6 - 11.0 K/uL   RBC 4.49 3.80 - 5.20 MIL/uL   Hemoglobin 13.8 12.0 - 16.0 g/dL   HCT 40.5 35.0 - 47.0 %   MCV 90.1 80.0 - 100.0 fL   MCH 30.6 26.0 - 34.0 pg   MCHC 34.0 32.0 - 36.0 g/dL   RDW 13.5 11.5 - 14.5 %   Platelets 265 150 - 440 K/uL  Troponin I  Result Value Ref Range   Troponin I <0.03 <0.03 ng/mL   Dg Chest 2 View  Result Date: 06/19/2017 CLINICAL DATA:  Pt c/o having sudden onset chest pain with SOB since 2pm today. Pt skin ia warm and dry. In NAD at present. Pt also c/o migraine all week. EXAM: CHEST  2 VIEW COMPARISON:  None. FINDINGS: The heart size and mediastinal contours are within normal limits. Both lungs are clear. The visualized skeletal structures are unremarkable. IMPRESSION: No active cardiopulmonary disease. Electronically Signed   By: Franki Cabot M.D.   On: 06/19/2017 17:38

## 2017-06-19 NOTE — Telephone Encounter (Signed)
In ED now.
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
## 2017-06-19 NOTE — Telephone Encounter (Signed)
Pt is in route to ED now.

## 2017-06-25 ENCOUNTER — Ambulatory Visit (INDEPENDENT_AMBULATORY_CARE_PROVIDER_SITE_OTHER): Payer: Medicare Other | Admitting: Obstetrics and Gynecology

## 2017-06-25 ENCOUNTER — Ambulatory Visit: Payer: Medicare Other

## 2017-06-25 VITALS — BP 123/90 | HR 73 | Wt 195.1 lb

## 2017-06-25 DIAGNOSIS — Z3042 Encounter for surveillance of injectable contraceptive: Secondary | ICD-10-CM | POA: Diagnosis not present

## 2017-06-25 MED ORDER — MEDROXYPROGESTERONE ACETATE 150 MG/ML IM SUSP
150.0000 mg | Freq: Once | INTRAMUSCULAR | Status: AC
  Administered 2017-06-25: 150 mg via INTRAMUSCULAR

## 2017-06-25 NOTE — Progress Notes (Signed)
Pt is here for depo provera, denies any s/e

## 2017-06-26 DIAGNOSIS — R51 Headache: Secondary | ICD-10-CM | POA: Diagnosis not present

## 2017-06-26 DIAGNOSIS — M542 Cervicalgia: Secondary | ICD-10-CM | POA: Diagnosis not present

## 2017-06-26 DIAGNOSIS — G43719 Chronic migraine without aura, intractable, without status migrainosus: Secondary | ICD-10-CM | POA: Diagnosis not present

## 2017-06-26 DIAGNOSIS — M791 Myalgia: Secondary | ICD-10-CM | POA: Diagnosis not present

## 2017-07-16 DIAGNOSIS — R7989 Other specified abnormal findings of blood chemistry: Secondary | ICD-10-CM | POA: Diagnosis not present

## 2017-08-03 ENCOUNTER — Telehealth: Payer: Self-pay | Admitting: Internal Medicine

## 2017-08-03 NOTE — Telephone Encounter (Signed)
Left pt message asking to call Allison back directly at [phone] to schedule AWV. Thanks! °  °*NOTE* No hx of AWV °

## 2017-08-03 NOTE — Telephone Encounter (Signed)
*  please verify what year pt started Medicare*

## 2017-08-05 ENCOUNTER — Other Ambulatory Visit: Payer: Self-pay | Admitting: Internal Medicine

## 2017-08-07 DIAGNOSIS — R51 Headache: Secondary | ICD-10-CM | POA: Diagnosis not present

## 2017-08-07 DIAGNOSIS — M542 Cervicalgia: Secondary | ICD-10-CM | POA: Diagnosis not present

## 2017-08-07 DIAGNOSIS — M791 Myalgia: Secondary | ICD-10-CM | POA: Diagnosis not present

## 2017-08-07 DIAGNOSIS — G43719 Chronic migraine without aura, intractable, without status migrainosus: Secondary | ICD-10-CM | POA: Diagnosis not present

## 2017-08-22 ENCOUNTER — Telehealth: Payer: Self-pay | Admitting: Internal Medicine

## 2017-08-22 NOTE — Telephone Encounter (Signed)
°*  STAT* If patient is at the pharmacy, call can be transferred to refill team.   1. Which medications need to be refilled? (please list name of each medication and dose if known)   Arnuity 18mcg  Inh 1 puff daily   2. Which pharmacy/location (including street and city if local pharmacy) is medication to be sent to?  [facility] [address]   3. Do they need a 30 day or 90 day supply? [place]

## 2017-08-23 MED ORDER — FLUTICASONE FUROATE 100 MCG/ACT IN AEPB: 1.0000 | INHALATION_SPRAY | Freq: Every day | RESPIRATORY_TRACT | 5 refills | Status: DC

## 2017-08-23 NOTE — Telephone Encounter (Signed)
Refill sent to pharmacy. Patient notified. Nothing further needed.  

## 2017-08-23 NOTE — Telephone Encounter (Signed)
Duplicate message this one will be closed.

## 2017-08-23 NOTE — Telephone Encounter (Signed)
Please review for refill. Thanks!  

## 2017-08-23 NOTE — Addendum Note (Signed)
Addended by: Devona Konig on: 08/23/2017 01:45 PM   Modules accepted: Orders

## 2017-08-23 NOTE — Telephone Encounter (Signed)
Patient calling to check on refill for arnuity please call to discuss she would like to know if she is to continue taking it

## 2017-08-27 DIAGNOSIS — G43719 Chronic migraine without aura, intractable, without status migrainosus: Secondary | ICD-10-CM | POA: Diagnosis not present

## 2017-09-05 ENCOUNTER — Ambulatory Visit (INDEPENDENT_AMBULATORY_CARE_PROVIDER_SITE_OTHER): Payer: Medicare Other

## 2017-09-05 VITALS — BP 124/82 | HR 74 | Temp 98.5°F | Resp 14 | Ht 63.0 in | Wt 192.8 lb

## 2017-09-05 DIAGNOSIS — Z0000 Encounter for general adult medical examination without abnormal findings: Secondary | ICD-10-CM

## 2017-09-05 NOTE — Patient Instructions (Addendum)
  Ms. Margaret Zuniga , Thank you for taking time to come for your Medicare Wellness Visit. I appreciate your ongoing commitment to your health goals. Please review the following plan we discussed and let me know if I can assist you in the future.   Follow up with Dr. Derrel Nip as needed.    Have a great day!  These are the goals we discussed: Goals    . Increase physical activity          Exercise with chair/standing exercises, as demonstrated and tolerated.        This is a list of the screening recommended for you and due dates:  Health Maintenance  Topic Date Due  . Pap Smear  04/20/2017  . Flu Shot  01/01/2018*  . Tetanus Vaccine  09/13/2025  . HIV Screening  Completed  *Topic was postponed. The date shown is not the original due date.

## 2017-09-05 NOTE — Progress Notes (Signed)
Subjective:   Margaret Zuniga is a 36 y.o. female who presents for an Initial Medicare Annual Wellness Visit.  Review of Systems    No ROS.  Medicare Wellness Visit. Additional risk factors are reflected in the social history.  Cardiac Risk Factors include: advanced age (>5men, >97 women);obesity (BMI >30kg/m2);hypertension     Objective:    Today's Vitals   09/05/17 1533  BP: 124/82  Pulse: 74  Resp: 14  Temp: 98.5 F (36.9 C)  TempSrc: Oral  SpO2: 97%  Weight: 192 lb 12.8 oz (87.5 kg)  Height: 5\' 3"  (1.6 m)   Body mass index is 34.15 kg/m.   Current Medications (verified) Outpatient Encounter Prescriptions as of 09/05/2017  Medication Sig  . Acetaminophen-Caffeine (EXCEDRIN TENSION HEADACHE) 500-65 MG TABS Take by mouth.  Marland Kitchen albuterol (PROVENTIL HFA;VENTOLIN HFA) 108 (90 Base) MCG/ACT inhaler Inhale 2 puffs into the lungs every 6 (six) hours as needed for wheezing or shortness of breath.  Marland Kitchen amLODipine (NORVASC) 2.5 MG tablet Take 1 tablet (2.5 mg total) by mouth daily.  . B Complex-C-Folic Acid (MULTIVITAMIN, STRESS FORMULA) tablet Take 1 tablet by mouth daily.    . baclofen (LIORESAL) 10 MG tablet Take 10 mg by mouth 2 (two) times daily. 2 days a week  . BOOSTRIX 5-2.5-18.5 injection TO BE ADMINISTERED BY PHARMACIST FOR IMMUNIZATION  . carbamazepine (CARBATROL) 300 MG 12 hr capsule TAKE 1 CAPSULE BY ORAL ROUTE 3 TIMES A DAY FOR 30 DAYS  . cetirizine (ZYRTEC) 10 MG tablet Take 10 mg by mouth daily.  . diphenhydrAMINE (BENADRYL) 25 mg capsule Take 2 capsules (50 mg total) by mouth every 6 (six) hours as needed.  . docusate sodium (COLACE) 100 MG capsule Take 2 capsules (200 mg total) by mouth 2 (two) times daily.  Marland Kitchen escitalopram (LEXAPRO) 10 MG tablet TAKE 1 TABLET (10 MG TOTAL) BY MOUTH DAILY.  Marland Kitchen Fluticasone Furoate (ARNUITY ELLIPTA) 100 MCG/ACT AEPB Inhale 1 puff into the lungs daily.  . MedroxyPROGESTERone Acetate 150 MG/ML SUSY INJECT 1 ML (150 MG TOTAL) INTO THE MUSCLE  EVERY 3 (THREE) MONTHS.  . Melatonin 5 MG TABS Take by mouth.  . metoCLOPramide (REGLAN) 10 MG tablet Take 1 tablet (10 mg total) by mouth every 6 (six) hours as needed.  . metoprolol succinate (TOPROL-XL) 100 MG 24 hr tablet TAKE 1 TABLET (100 MG TOTAL) BY MOUTH DAILY. TAKE WITH OR IMMEDIATELY FOLLOWING A MEAL.  Marland Kitchen senna (SENOKOT) 8.6 MG TABS tablet Take 2 tablets (17.2 mg total) by mouth 2 (two) times daily. (Patient taking differently: Take 2 tablets by mouth daily as needed. )   No facility-administered encounter medications on file as of 09/05/2017.     Allergies (verified) Zonegran [zonisamide]   History: Past Medical History:  Diagnosis Date  . Extrinsic asthma 08/15/2016  . 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 Hypertension   . 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 Personal history of traumatic brain injury 62  . Screening for cervical cancer May 2012   , reportedly normal  . Shoulder impingement 2009   surgical relesase, Dr. Marry Guan   Past Surgical History:  Procedure Laterality Date  . EYE SURGERY  1995  . LEG SURGERY  1985  . SUBACROMIAL DECOMPRESSION  2000   Right shoulder, [place]  . TONSILLECTOMY  2001   Family History  Problem Relation Age of Onset  . Diabetes Mother   . Coronary artery disease Mother   . Hyperlipidemia Mother   . Hypertension Mother   . Heart disease Maternal Grandfather    Social History   Occupational History  . Not on file.   Social History Main Topics  . Smoking status: Never Smoker  . Smokeless tobacco: Never Used  . Alcohol use No  . Drug use: No  . Sexual activity: Yes    Birth control/ protection: Injection    Tobacco Counseling Counseling given: Not Answered   Activities of Daily Living In your present state of health, do you have any difficulty performing the following activities: 09/05/2017    Hearing? N  Vision? N  Difficulty concentrating or making decisions? N  Walking or climbing stairs? Y  Dressing or bathing? N  Doing errands, shopping? Y  Preparing Food and eating ? N  Using the Toilet? N  In the past six months, have you accidently leaked urine? N  Do you have problems with loss of bowel control? N  Managing your Medications? N  Housekeeping or managing your Housekeeping? Y  Some recent data might be hidden    Immunizations and Health Maintenance Immunization History  Administered Date(s) Administered  . Influenza Split 09/01/2013  . Influenza-Unspecified 08/21/2014, 08/31/2015, 08/18/2016  . Tdap 09/14/2015   Health Maintenance Due  Topic Date Due  . PAP SMEAR  04/20/2017    Patient Care Team: Crecencio Mc, MD as PCP - General (Internal Medicine)  Indicate any recent Medical Services you may have received from other than [facility] providers in the past year (date may be approximate).     Assessment:   This is a routine wellness examination for Margaret Zuniga. The goal of the wellness visit is to assist the patient how to close the gaps in care and create a preventative care plan for the patient.   The roster of all physicians providing medical care to patient is listed in the Snapshot section of the chart.  Safety issues reviewed; Smoke and carbon monoxide detectors in the home. No firearms in the home. Wears seatbelts when riding with others. Patient does wear sunscreen or protective clothing when in direct sunlight. No violence in the home.  Patient is alert, normal appearance, oriented to person/place/and time.  Correctly identified the president of the [place], recall of 3/3 words, and performing simple calculations. Displays appropriate judgement and can read correct time from watch face.   No new identified risk were noted.  No failures at ADL's or IADL's.    BMI- discussed the importance of a healthy diet, water intake and the benefits of aerobic  exercise. Educational material provided.   24 hour diet recall: Breakfast: eggs, grits, bacon Lunch: soup Dinner: chicken, rice, beans Snack: brownie Daily fluid intake: 1 cups of caffeine, 4 cups of water  Dental- every 6 months.  Dr. Sharlett Iles.  Eye- Visual acuity not assessed per patient preference since they have regular follow up with the ophthalmologist.   Sleep patterns- Sleeps 7 hours at night.    Patient Concerns: Raised skin lesion L side of torso; denies drainage/bleeding.  Itches and burns, intermittently. Follow up scheduled with PCP.  Hearing/Vision screen Hearing Screening Comments: Patient is able to hear conversational tones without difficulty.  No issues reported.   Vision Screening Comments: Followed by [facility] Visual acuity not assessed per patient preference since they have regular follow up with the ophthalmologist  Dietary issues and exercise  activities discussed: Current Exercise Habits: The patient does not participate in regular exercise at present  Goals    . Increase physical activity          Exercise with chair/standing exercises, as demonstrated and tolerated.       Depression Screen PHQ 2/9 Scores 09/05/2017 04/10/2017  PHQ - 2 Score 2 0  PHQ- 9 Score 2 2    Fall Risk Fall Risk  09/05/2017  Falls in the past year? No    Cognitive Function: MMSE - Mini Mental State Exam 09/05/2017  Orientation to time 5  Orientation to Place 5  Registration 3  Attention/ Calculation 5  Recall 3  Language- name 2 objects 2  Language- repeat 1  Language- follow 3 step command 3  Language- read & follow direction 1  Write a sentence 1  Copy design 1  Total score 30        Screening Tests Health Maintenance  Topic Date Due  . PAP SMEAR  04/20/2017  . INFLUENZA VACCINE  01/01/2018 (Originally 07/11/2017)  . TETANUS/TDAP  09/13/2025  . HIV Screening  Completed      Plan:   End of life planning; Advanced aging; Advanced  directives discussed.  No HCPOA/Living Will.  Additional information declined at this time.  I have personally reviewed and noted the following in the patient's chart:   . Medical and social history . Use of alcohol, tobacco or illicit drugs  . Current medications and supplements . Functional ability and status . Nutritional status . Physical activity . Advanced directives . List of other physicians . Hospitalizations, surgeries, and ER visits in previous 12 months . Vitals . Screenings to include cognitive, depression, and falls . Referrals and appointments  In addition, I have reviewed and discussed with patient certain preventive protocols, quality metrics, and best practice recommendations. A written personalized care plan for preventive services as well as general preventive health recommendations were provided to patient.     OBrien-Blaney, Kayler Buckholtz L, LPN   2/45/8099      I have reviewed the above information and agree with above.   Deborra Medina, MD

## 2017-09-06 NOTE — Telephone Encounter (Signed)
Completed 09/05/17

## 2017-09-10 ENCOUNTER — Ambulatory Visit (INDEPENDENT_AMBULATORY_CARE_PROVIDER_SITE_OTHER): Payer: Medicare Other | Admitting: Obstetrics and Gynecology

## 2017-09-10 VITALS — BP 131/90 | HR 72 | Wt 194.2 lb

## 2017-09-10 DIAGNOSIS — Z3042 Encounter for surveillance of injectable contraceptive: Secondary | ICD-10-CM | POA: Diagnosis not present

## 2017-09-10 MED ORDER — MEDROXYPROGESTERONE ACETATE 150 MG/ML IM SUSP
150.0000 mg | Freq: Once | INTRAMUSCULAR | Status: AC
Start: 2017-09-10 — End: 2017-09-10
  Administered 2017-09-10: 150 mg via INTRAMUSCULAR

## 2017-09-10 NOTE — Progress Notes (Signed)
I have reviewed the record and concur with patient management and plan.  Continue injections q 3 months.    Rubie Maid, MD [facility]

## 2017-09-10 NOTE — Progress Notes (Signed)
Patient ID: Margaret Zuniga, female   DOB: 14-Jan-1981, 36 y.o.   MRN: [id number] Pt presents for depo-provera injection without any undesirable side effects.

## 2017-09-13 ENCOUNTER — Encounter: Payer: Self-pay | Admitting: Internal Medicine

## 2017-09-13 ENCOUNTER — Ambulatory Visit (INDEPENDENT_AMBULATORY_CARE_PROVIDER_SITE_OTHER): Payer: Medicare Other | Admitting: Internal Medicine

## 2017-09-13 VITALS — BP 124/88 | HR 81 | Temp 98.4°F | Resp 15 | Ht 63.0 in | Wt 193.6 lb

## 2017-09-13 DIAGNOSIS — Z23 Encounter for immunization: Secondary | ICD-10-CM

## 2017-09-13 DIAGNOSIS — I10 Essential (primary) hypertension: Secondary | ICD-10-CM | POA: Diagnosis not present

## 2017-09-13 DIAGNOSIS — E782 Mixed hyperlipidemia: Secondary | ICD-10-CM

## 2017-09-13 DIAGNOSIS — D492 Neoplasm of unspecified behavior of bone, soft tissue, and skin: Secondary | ICD-10-CM

## 2017-09-13 DIAGNOSIS — E669 Obesity, unspecified: Secondary | ICD-10-CM | POA: Diagnosis not present

## 2017-09-13 DIAGNOSIS — R7301 Impaired fasting glucose: Secondary | ICD-10-CM

## 2017-09-13 DIAGNOSIS — E559 Vitamin D deficiency, unspecified: Secondary | ICD-10-CM | POA: Diagnosis not present

## 2017-09-13 NOTE — Progress Notes (Signed)
Subjective:  Patient ID: Margaret Zuniga, female    DOB: 08/06/1981  Age: 36 y.o. MRN: [id number]  CC: The primary encounter diagnosis was Skin neoplasm. Diagnoses of Obesity (BMI 30.0-34.9), Essential hypertension, benign, Vitamin D deficiency, Mixed hyperlipidemia, Impaired fasting glucose, and Need for immunization against influenza were also pertinent to this visit.  HPI LATASIA SILBERSTEIN presents for evaluation of a skin lesion and follow p on chroinc issues including hypertension .  The lesion was first noticed ten days ago  On her abdominal wall below her left breast. First became aware of It  When she felt a burning sensation while showering .  It has not bled   Has been irritated by seat belt and bra   Hypertension: patient  Used to check blood pressure once weekly at home.  Has not checked it in several months.  . Patient is following a reduced salt diet most days and is taking medications as prescribed     Outpatient Medications Prior to Visit  Medication Sig Dispense Refill  . Acetaminophen-Caffeine (EXCEDRIN TENSION HEADACHE) 500-65 MG TABS Take by mouth.    Marland Kitchen albuterol (PROVENTIL HFA;VENTOLIN HFA) 108 (90 Base) MCG/ACT inhaler Inhale 2 puffs into the lungs every 6 (six) hours as needed for wheezing or shortness of breath. 1 Inhaler 2  . amLODipine (NORVASC) 2.5 MG tablet Take 1 tablet (2.5 mg total) by mouth daily. 90 tablet 3  . B Complex-C-Folic Acid (MULTIVITAMIN, STRESS FORMULA) tablet Take 1 tablet by mouth daily.      . baclofen (LIORESAL) 10 MG tablet Take 10 mg by mouth 2 (two) times daily. 2 days a week    . BOOSTRIX 5-2.5-18.5 injection TO BE ADMINISTERED BY PHARMACIST FOR IMMUNIZATION  0  . carbamazepine (CARBATROL) 300 MG 12 hr capsule TAKE 1 CAPSULE BY ORAL ROUTE 3 TIMES A DAY FOR 30 DAYS  1  . cetirizine (ZYRTEC) 10 MG tablet Take 10 mg by mouth daily.    . diphenhydrAMINE (BENADRYL) 25 mg capsule Take 2 capsules (50 mg total) by mouth every 6 (six) hours as needed. 60 capsule  0  . docusate sodium (COLACE) 100 MG capsule Take 2 capsules (200 mg total) by mouth 2 (two) times daily. 120 capsule 0  . escitalopram (LEXAPRO) 10 MG tablet TAKE 1 TABLET (10 MG TOTAL) BY MOUTH DAILY. 90 tablet 1  . Fluticasone Furoate (ARNUITY ELLIPTA) 100 MCG/ACT AEPB Inhale 1 puff into the lungs daily. 30 each 5  . MedroxyPROGESTERone Acetate 150 MG/ML SUSY INJECT 1 ML (150 MG TOTAL) INTO THE MUSCLE EVERY 3 (THREE) MONTHS. 1 Syringe 3  . Melatonin 5 MG TABS Take by mouth.    . metoCLOPramide (REGLAN) 10 MG tablet Take 1 tablet (10 mg total) by mouth every 6 (six) hours as needed. 30 tablet 0  . metoprolol succinate (TOPROL-XL) 100 MG 24 hr tablet TAKE 1 TABLET (100 MG TOTAL) BY MOUTH DAILY. TAKE WITH OR IMMEDIATELY FOLLOWING A MEAL. 90 tablet 1  . senna (SENOKOT) 8.6 MG TABS tablet Take 2 tablets (17.2 mg total) by mouth 2 (two) times daily. (Patient taking differently: Take 2 tablets by mouth daily as needed. ) 120 each 0   No facility-administered medications prior to visit.     Review of Systems;  Patient denies headache, fevers, malaise, unintentional weight loss, skin rash, eye pain, sinus congestion and sinus pain, sore throat, dysphagia,  hemoptysis , cough, dyspnea, wheezing, chest pain, palpitations, orthopnea, edema, abdominal pain, nausea, melena, diarrhea, constipation, flank  pain, dysuria, hematuria, urinary  Frequency, nocturia, numbness, tingling, seizures,  Focal weakness, Loss of consciousness,  Tremor, insomnia, depression, anxiety, and suicidal ideation.      Objective:  BP 124/88 (BP Location: Left Arm, Patient Position: Sitting, Cuff Size: Normal)   Pulse 81   Temp 98.4 F (36.9 C) (Oral)   Resp 15   Ht 5\' 3"  (1.6 m)   Wt 193 lb 9.6 oz (87.8 kg)   SpO2 96%   BMI 34.29 kg/m   BP Readings from Last 3 Encounters:  09/13/17 124/88  09/10/17 131/90  09/05/17 124/82    Wt Readings from Last 3 Encounters:  09/13/17 193 lb 9.6 oz (87.8 kg)  09/10/17 194 lb 3.2  oz (88.1 kg)  09/05/17 192 lb 12.8 oz (87.5 kg)    General appearance: alert, cooperative and appears stated age Neck: no adenopathy, no carotid bruit, supple, symmetrical, trachea midline and thyroid not enlarged, symmetric, no tenderness/mass/nodules Back: symmetric, no curvature. ROM normal. No CVA tenderness. Lungs: clear to auscultation bilaterally Heart: regular rate and rhythm, S1, S2 normal, no murmur, click, rub or gallop Abdomen: soft, non-tender; bowel sounds normal; no masses,  no organomegaly Skin: 1 mm papular flesh colored lesion on abdominal wall with a budding/septated confguration Lymph nodes: Cervical, supraclavicular, and axillary nodes normal.  Lab Results  Component Value Date   HGBA1C 5.4 04/17/2016    Lab Results  Component Value Date   CREATININE 0.82 06/19/2017   CREATININE 0.81 02/05/2017   CREATININE 0.76 04/17/2016    Lab Results  Component Value Date   WBC 10.4 06/19/2017   HGB 13.8 06/19/2017   HCT 40.5 06/19/2017   PLT 265 06/19/2017   GLUCOSE 114 (H) 06/19/2017   CHOL 169 04/17/2016   TRIG 110.0 04/17/2016   HDL 53.90 04/17/2016   LDLDIRECT 109.0 04/17/2016   LDLCALC 93 04/17/2016   ALT 39 (H) 02/20/2017   AST 16 02/20/2017   NA 141 06/19/2017   K 3.9 06/19/2017   CL 108 06/19/2017   CREATININE 0.82 06/19/2017   BUN 14 06/19/2017   CO2 25 06/19/2017   TSH 1.66 04/17/2016   HGBA1C 5.4 04/17/2016   MICROALBUR <0.7 04/19/2016    Dg Chest 2 View  Result Date: 06/19/2017 CLINICAL DATA:  Pt c/o having sudden onset chest pain with SOB since 2pm today. Pt skin ia warm and dry. In NAD at present. Pt also c/o migraine all week. EXAM: CHEST  2 VIEW COMPARISON:  None. FINDINGS: The heart size and mediastinal contours are within normal limits. Both lungs are clear. The visualized skeletal structures are unremarkable. IMPRESSION: No active cardiopulmonary disease. Electronically Signed   By: Franki Cabot M.D.   On: 06/19/2017 17:38     Assessment & Plan:   Problem List Items Addressed This Visit    Essential hypertension, benign    Systolic is at goal  on current regimen. Renal function stable, no changes today. Home readings  requested.   Lab Results  Component Value Date   CREATININE 0.82 06/19/2017   Lab Results  Component Value Date   NA 141 06/19/2017   K 3.9 06/19/2017   CL 108 06/19/2017   CO2 25 06/19/2017         Relevant Orders   Comprehensive metabolic panel   Obesity (BMI 30.0-34.9)    I have addressed  BMI and recommended wt loss of 10% of body weight over the next 6 months using a low fat, fruit/vegetable based Mediteranean diet and  regular exercise a minimum of 5 days per week.        Skin neoplasm - Primary    The new lesion on her abdominal wall may be a basal cell ca.  Referring to dr Phillip Heal for evaluation.  Advised not to use steroid creams on it and to keep it covered to avoid irritation       Relevant Orders   Ambulatory referral to Dermatology    Other Visit Diagnoses    Vitamin D deficiency       Relevant Orders   VITAMIN D 25 Hydroxy (Vit-D Deficiency, Fractures)   Mixed hyperlipidemia       Relevant Orders   Lipid panel   TSH   Impaired fasting glucose       Relevant Orders   Hemoglobin A1c   Need for immunization against influenza       Relevant Orders   Flu Vaccine QUAD 36+ mos IM (Completed)      I am having Ms. Wampole maintain her (multivitamin, stress formula), cetirizine, baclofen, docusate sodium, senna, BOOSTRIX, Acetaminophen-Caffeine, amLODipine, MedroxyPROGESTERone Acetate, carbamazepine, Melatonin, albuterol, metoCLOPramide, diphenhydrAMINE, metoprolol succinate, escitalopram, and Fluticasone Furoate.  No orders of the defined types were placed in this encounter.   There are no discontinued medications.  Follow-up: No Follow-up on file.   Crecencio Mc, MD

## 2017-09-13 NOTE — Patient Instructions (Signed)
  I am referring you to Dr Phillip Heal to biopsy the skin lesion on your abdominal wall  Keep it covered to avoid irritation.  Do NOT use any steroid creams ,  Because this may cloud  the results if Dr Phillip Heal does a biopsy     The new goals for optimal blood pressure management are 120/70.  Please check your blood pressure a few times at home and send me the readings so I can determine if you need a change in medication

## 2017-09-15 ENCOUNTER — Encounter: Payer: Self-pay | Admitting: Internal Medicine

## 2017-09-15 DIAGNOSIS — D492 Neoplasm of unspecified behavior of bone, soft tissue, and skin: Secondary | ICD-10-CM | POA: Insufficient documentation

## 2017-09-15 NOTE — Assessment & Plan Note (Signed)
I have addressed  BMI and recommended wt loss of 10% of body weight over the next 6 months using a low fat, fruit/vegetable based [place] diet and regular exercise a minimum of 5 days per week.   

## 2017-09-15 NOTE — Assessment & Plan Note (Signed)
The new lesion on her abdominal wall may be a basal cell ca.  Referring to dr Phillip Heal for evaluation.  Advised not to use steroid creams on it and to keep it covered to avoid irritation

## 2017-09-15 NOTE — Assessment & Plan Note (Addendum)
Systolic is at goal  on current regimen. Renal function stable, no changes today. Home readings  requested.   Lab Results  Component Value Date   CREATININE 0.82 06/19/2017   Lab Results  Component Value Date   NA 141 06/19/2017   K 3.9 06/19/2017   CL 108 06/19/2017   CO2 25 06/19/2017

## 2017-09-18 DIAGNOSIS — M542 Cervicalgia: Secondary | ICD-10-CM | POA: Diagnosis not present

## 2017-09-18 DIAGNOSIS — G43719 Chronic migraine without aura, intractable, without status migrainosus: Secondary | ICD-10-CM | POA: Diagnosis not present

## 2017-09-18 DIAGNOSIS — M791 Myalgia, unspecified site: Secondary | ICD-10-CM | POA: Diagnosis not present

## 2017-09-18 DIAGNOSIS — R51 Headache: Secondary | ICD-10-CM | POA: Diagnosis not present

## 2017-09-26 DIAGNOSIS — D229 Melanocytic nevi, unspecified: Secondary | ICD-10-CM | POA: Diagnosis not present

## 2017-10-08 ENCOUNTER — Other Ambulatory Visit (INDEPENDENT_AMBULATORY_CARE_PROVIDER_SITE_OTHER): Payer: Medicare Other

## 2017-10-08 DIAGNOSIS — E559 Vitamin D deficiency, unspecified: Secondary | ICD-10-CM

## 2017-10-08 DIAGNOSIS — R7301 Impaired fasting glucose: Secondary | ICD-10-CM | POA: Diagnosis not present

## 2017-10-08 DIAGNOSIS — I10 Essential (primary) hypertension: Secondary | ICD-10-CM | POA: Diagnosis not present

## 2017-10-08 DIAGNOSIS — E782 Mixed hyperlipidemia: Secondary | ICD-10-CM

## 2017-10-08 LAB — COMPREHENSIVE METABOLIC PANEL
ALT: 32 U/L (ref 0–35)
AST: 16 U/L (ref 0–37)
Albumin: 4.2 g/dL (ref 3.5–5.2)
Alkaline Phosphatase: 74 U/L (ref 39–117)
BUN: 15 mg/dL (ref 6–23)
CO2: 24 mEq/L (ref 19–32)
Calcium: 9.2 mg/dL (ref 8.4–10.5)
Chloride: 110 mEq/L (ref 96–112)
Creatinine, Ser: 0.7 mg/dL (ref 0.40–1.20)
GFR: 99.11 mL/min (ref >60.00)
Glucose, Bld: 96 mg/dL (ref 70–99)
Potassium: 4 mEq/L (ref 3.5–5.1)
Sodium: 143 mEq/L (ref 135–145)
Total Bilirubin: 0.4 mg/dL (ref 0.2–1.2)
Total Protein: 6.6 g/dL (ref 6.0–8.3)

## 2017-10-08 LAB — LIPID PANEL
Cholesterol: 176 mg/dL (ref 0–200)
HDL: 51.7 mg/dL (ref >39.00)
LDL Cholesterol: 106 mg/dL — ABNORMAL HIGH (ref 0–99)
NonHDL: 123.9
Total CHOL/HDL Ratio: 3
Triglycerides: 90 mg/dL (ref 0.0–149.0)
VLDL: 18 mg/dL (ref 0.0–40.0)

## 2017-10-08 LAB — TSH: TSH: 2.88 u[IU]/mL (ref 0.35–4.50)

## 2017-10-08 LAB — VITAMIN D 25 HYDROXY (VIT D DEFICIENCY, FRACTURES): VITD: 37.55 ng/mL (ref 30.00–100.00)

## 2017-10-08 LAB — HEMOGLOBIN A1C: Hgb A1c MFr Bld: 5.1 % (ref 4.6–6.5)

## 2017-10-12 ENCOUNTER — Ambulatory Visit: Payer: Medicare Other | Admitting: Internal Medicine

## 2017-10-16 ENCOUNTER — Encounter: Payer: Self-pay | Admitting: Internal Medicine

## 2017-10-16 ENCOUNTER — Ambulatory Visit (INDEPENDENT_AMBULATORY_CARE_PROVIDER_SITE_OTHER): Payer: Medicare Other | Admitting: Internal Medicine

## 2017-10-16 DIAGNOSIS — E669 Obesity, unspecified: Secondary | ICD-10-CM | POA: Diagnosis not present

## 2017-10-16 DIAGNOSIS — I10 Essential (primary) hypertension: Secondary | ICD-10-CM

## 2017-10-16 DIAGNOSIS — J069 Acute upper respiratory infection, unspecified: Secondary | ICD-10-CM

## 2017-10-16 MED ORDER — PREDNISONE 10 MG PO TABS: ORAL_TABLET | ORAL | 0 refills | Status: DC

## 2017-10-16 MED ORDER — AMOXICILLIN-POT CLAVULANATE 875-125 MG PO TABS: 1.0000 | ORAL_TABLET | Freq: Two times a day (BID) | ORAL | 0 refills | Status: DC

## 2017-10-16 NOTE — Progress Notes (Signed)
Subjective:  Patient ID: Margaret Zuniga, female    DOB: [birth year]  Age: 36 y.o. MRN: [id number]  CC: Diagnoses of Obesity (BMI 30.0-34.9), Essential hypertension, benign, and Viral URI were pertinent to this visit.  HPI Margaret Zuniga presents for one month follow up on multiple issues:  Hypertension: patient  has not been home checking due to faulty cuff but has had one home reading that was 124/80. Marland KitchenPatient is following a reduced salt diet most days and is taking medications as prescribed  Sore throat,  Drainage,  Sneezing,  Congestion with pain or fevers,  Present since Friday.  Takes Zyrtec chronically.   Taking tylenol cold., (has phenylephrine )   And mucus relief .  No Cough is not keeping her up at night,   Had appt with dermatology to evaluate the lesion under her breast.  No skin ca , normal mole.   Obesity:  Weight gain addressed.  She is not exercising because she  has chronic headaches.  She doesnot work.  Lives a sedentary life.  Not walking.   Not following a careful diet.    Outpatient Medications Prior to Visit  Medication Sig Dispense Refill  . Acetaminophen-Caffeine (EXCEDRIN TENSION HEADACHE) 500-65 MG TABS Take by mouth.    Marland Kitchen albuterol (PROVENTIL HFA;VENTOLIN HFA) 108 (90 Base) MCG/ACT inhaler Inhale 2 puffs into the lungs every 6 (six) hours as needed for wheezing or shortness of breath. 1 Inhaler 2  . amLODipine (NORVASC) 2.5 MG tablet Take 1 tablet (2.5 mg total) by mouth daily. 90 tablet 3  . B Complex-C-Folic Acid (MULTIVITAMIN, STRESS FORMULA) tablet Take 1 tablet by mouth daily.      . baclofen (LIORESAL) 10 MG tablet Take 10 mg by mouth 2 (two) times daily. 2 days a week    . BOOSTRIX 5-2.5-18.5 injection TO BE ADMINISTERED BY PHARMACIST FOR IMMUNIZATION  0  . carbamazepine (CARBATROL) 300 MG 12 hr capsule TAKE 1 CAPSULE BY ORAL ROUTE 3 TIMES A DAY FOR 30 DAYS  1  . cetirizine (ZYRTEC) 10 MG tablet Take 10 mg by mouth daily.    . diphenhydrAMINE (BENADRYL) 25 mg  capsule Take 2 capsules (50 mg total) by mouth every 6 (six) hours as needed. 60 capsule 0  . docusate sodium (COLACE) 100 MG capsule Take 2 capsules (200 mg total) by mouth 2 (two) times daily. 120 capsule 0  . escitalopram (LEXAPRO) 10 MG tablet TAKE 1 TABLET (10 MG TOTAL) BY MOUTH DAILY. 90 tablet 1  . Fluticasone Furoate (ARNUITY ELLIPTA) 100 MCG/ACT AEPB Inhale 1 puff into the lungs daily. 30 each 5  . MedroxyPROGESTERone Acetate 150 MG/ML SUSY INJECT 1 ML (150 MG TOTAL) INTO THE MUSCLE EVERY 3 (THREE) MONTHS. 1 Syringe 3  . metoCLOPramide (REGLAN) 10 MG tablet Take 1 tablet (10 mg total) by mouth every 6 (six) hours as needed. 30 tablet 0  . metoprolol succinate (TOPROL-XL) 100 MG 24 hr tablet TAKE 1 TABLET (100 MG TOTAL) BY MOUTH DAILY. TAKE WITH OR IMMEDIATELY FOLLOWING A MEAL. 90 tablet 1  . senna (SENOKOT) 8.6 MG TABS tablet Take 2 tablets (17.2 mg total) by mouth 2 (two) times daily. (Patient taking differently: Take 2 tablets by mouth daily as needed. ) 120 each 0  . Melatonin 5 MG TABS Take by mouth.     No facility-administered medications prior to visit.     Review of Systems;  Patient denies headache, fevers, malaise, unintentional weight loss, skin rash, eye pain, sinus  congestion and sinus pain, sore throat, dysphagia,  hemoptysis , cough, dyspnea, wheezing, chest pain, palpitations, orthopnea, edema, abdominal pain, nausea, melena, diarrhea, constipation, flank pain, dysuria, hematuria, urinary  Frequency, nocturia, numbness, tingling, seizures,  Focal weakness, Loss of consciousness,  Tremor, insomnia, depression, anxiety, and suicidal ideation.      Objective:  BP 112/88 (BP Location: Left Arm, Patient Position: Sitting, Cuff Size: Normal)   Pulse 90   Temp 98.1 F (36.7 C) (Oral)   Resp 16   Ht 5\' 3"  (1.6 m)   Wt 190 lb 6.4 oz (86.4 kg)   SpO2 98%   BMI 33.73 kg/m   BP Readings from Last 3 Encounters:  10/16/17 112/88  09/13/17 124/88  09/10/17 131/90    Wt  Readings from Last 3 Encounters:  10/16/17 190 lb 6.4 oz (86.4 kg)  09/13/17 193 lb 9.6 oz (87.8 kg)  09/10/17 194 lb 3.2 oz (88.1 kg)    General appearance: alert, cooperative and appears stated age Ears: normal TM's and external ear canals both ears Throat: lips, mucosa, and tongue normal; teeth and gums normal Neck: no adenopathy, no carotid bruit, supple, symmetrical, trachea midline and thyroid not enlarged, symmetric, no tenderness/mass/nodules Back: symmetric, no curvature. ROM normal. No CVA tenderness. Lungs: clear to auscultation bilaterally Heart: regular rate and rhythm, S1, S2 normal, no murmur, click, rub or gallop Abdomen: soft, non-tender; bowel sounds normal; no masses,  no organomegaly Pulses: 2+ and symmetric Skin: Skin color, texture, turgor normal. No rashes or lesions Lymph nodes: Cervical, supraclavicular, and axillary nodes normal.  Lab Results  Component Value Date   HGBA1C 5.1 10/08/2017   HGBA1C 5.4 04/17/2016    Lab Results  Component Value Date   CREATININE 0.70 10/08/2017   CREATININE 0.82 06/19/2017   CREATININE 0.81 02/05/2017    Lab Results  Component Value Date   WBC 10.4 06/19/2017   HGB 13.8 06/19/2017   HCT 40.5 06/19/2017   PLT 265 06/19/2017   GLUCOSE 96 10/08/2017   CHOL 176 10/08/2017   TRIG 90.0 10/08/2017   HDL 51.70 10/08/2017   LDLDIRECT 109.0 04/17/2016   LDLCALC 106 (H) 10/08/2017   ALT 32 10/08/2017   AST 16 10/08/2017   NA 143 10/08/2017   K 4.0 10/08/2017   CL 110 10/08/2017   CREATININE 0.70 10/08/2017   BUN 15 10/08/2017   CO2 24 10/08/2017   TSH 2.88 10/08/2017   HGBA1C 5.1 10/08/2017   MICROALBUR <0.7 04/19/2016    Dg Chest 2 View  Result Date: 06/19/2017 CLINICAL DATA:  Pt c/o having sudden onset chest pain with SOB since 2pm today. Pt skin ia warm and dry. In NAD at present. Pt also c/o migraine all week. EXAM: CHEST  2 VIEW COMPARISON:  None. FINDINGS: The heart size and mediastinal contours are within  normal limits. Both lungs are clear. The visualized skeletal structures are unremarkable. IMPRESSION: No active cardiopulmonary disease. Electronically Signed   By: Franki Cabot M.D.   On: 06/19/2017 17:38    Assessment & Plan:   Problem List Items Addressed This Visit    Essential hypertension, benign    Well controlled on current regimen. Renal function stable, no changes today.  Lab Results  Component Value Date   CREATININE 0.70 10/08/2017   Lab Results  Component Value Date   NA 143 10/08/2017   K 4.0 10/08/2017   CL 110 10/08/2017   CO2 24 10/08/2017         Obesity (BMI 30.0-34.9)  I spent 15 minutes addressing  BMI and recommended wt loss of 10% of body weigh over the next 6 months using a low glycemic index diet and regular exercise a minimum of 5 days per week. She has had difficulty losing weight due to lack of motivation to change her sedentary lifestyle .      Viral URI    She is not febrile, coughing  or wheezing but has a history of asthma,  Will treat with prednisone taper . Advised to start augmentin if fevers, facial pain or ear pain develop.         A total of 25 minutes of face to face time was spent with patient more than half of which was spent in counselling about the above mentioned conditions  and coordination of care  I have discontinued Manmeet R. Muraoka's Melatonin. I am also having her start on predniSONE and amoxicillin-clavulanate. Additionally, I am having her maintain her (multivitamin, stress formula), cetirizine, baclofen, docusate sodium, senna, BOOSTRIX, Acetaminophen-Caffeine, amLODipine, MedroxyPROGESTERone Acetate, carbamazepine, albuterol, metoCLOPramide, diphenhydrAMINE, metoprolol succinate, escitalopram, and Fluticasone Furoate.  Meds ordered this encounter  Medications  . predniSONE (DELTASONE) 10 MG tablet    Sig: 6 tablets all at once on Day 1 , then reduce by 1 tablet daily until gone    Dispense:  21 tablet    Refill:  0  .  amoxicillin-clavulanate (AUGMENTIN) 875-125 MG tablet    Sig: Take 1 tablet 2 (two) times daily by mouth.    Dispense:  14 tablet    Refill:  0    Medications Discontinued During This Encounter  Medication Reason  . Melatonin 5 MG TABS Patient has not taken in last 30 days    Follow-up: Return in about 6 months (around 04/15/2018).   Crecencio Mc, MD

## 2017-10-16 NOTE — Patient Instructions (Addendum)
You have a viral  Syndrome .  The post nasal drip is causing your sore throat Flush your sinuses once or  twice daily with  [employer]'s sinus rinse.  Prednisone taper for the inflammation    you can add Afrin nasal spray every 12 hours as needed for the congestion. (maxiumum 5 days)   Continue tylenol Cold   And gargle with  salt water as needed for the sore throat.  If you develop T > 100.4,  Green nasal discharge,  Or facial pain,  Start the augmentin antibiotic    Please take a probiotic ( Align, Floraque or Culturelle), the generic version of one of these over the counter medications, or a PROBIOTIC BEVERAGE  (kombucha,  Nance ) Yogurt, or another dietary source) for a minimum of 3 weeks to prevent a serious antibiotic associated diarrhea  Called clostridium dificile colitis.  Taking a probiotic may also prevent vaginitis due to yeast infections and can be continued indefinitely if you feel that it improves your digestion or your elimination (bowels)..    Your blood pressure medications have not been changed   As long as your readings are 130/80 or less , you do not need any changes    Congratulations on losing  3 lbs since your last visit!  Your eventual  goal is 168 lbs to get BMI < 30 .  start with 15 minutes of walking 3 days per week and wour your way up to 5 days per week  30 minutes    I Recommend Low Carb high Protein,   premixed  For breakfast :   Premier Protein  Atkins Advantage Muscle Milk EAS AdvantEdge   All of these are available at [employer], [employer],  [employer], and [employer]  And taste good   Danton Clap now makes a frozen breakfast " frittata"  And "frittata sandwich "  that can be microwaved in 2 minutes and is very low carb. Frittatas are similar to quiches without the crust.

## 2017-10-17 NOTE — Assessment & Plan Note (Addendum)
She is not febrile, coughing  or wheezing but has a history of asthma,  Will treat with prednisone taper . Advised to start augmentin if fevers, facial pain or ear pain develop.

## 2017-10-17 NOTE — Assessment & Plan Note (Signed)
Well controlled on current regimen. Renal function stable, no changes today.  Lab Results  Component Value Date   CREATININE 0.70 10/08/2017   Lab Results  Component Value Date   NA 143 10/08/2017   K 4.0 10/08/2017   CL 110 10/08/2017   CO2 24 10/08/2017

## 2017-10-17 NOTE — Assessment & Plan Note (Signed)
I spent 15 minutes addressing  BMI and recommended wt loss of 10% of body weigh over the next 6 months using a low glycemic index diet and regular exercise a minimum of 5 days per week. She has had difficulty losing weight due to lack of motivation to change her sedentary lifestyle .

## 2017-10-24 ENCOUNTER — Encounter: Payer: Self-pay | Admitting: Obstetrics and Gynecology

## 2017-10-24 ENCOUNTER — Ambulatory Visit (INDEPENDENT_AMBULATORY_CARE_PROVIDER_SITE_OTHER): Payer: Medicare Other | Admitting: Obstetrics and Gynecology

## 2017-10-24 VITALS — BP 144/94 | HR 85 | Ht 63.0 in | Wt 190.5 lb

## 2017-10-24 DIAGNOSIS — Z3042 Encounter for surveillance of injectable contraceptive: Secondary | ICD-10-CM | POA: Diagnosis not present

## 2017-10-24 DIAGNOSIS — E669 Obesity, unspecified: Secondary | ICD-10-CM | POA: Diagnosis not present

## 2017-10-24 DIAGNOSIS — Z124 Encounter for screening for malignant neoplasm of cervix: Secondary | ICD-10-CM

## 2017-10-24 DIAGNOSIS — Z01419 Encounter for gynecological examination (general) (routine) without abnormal findings: Secondary | ICD-10-CM

## 2017-10-24 NOTE — Progress Notes (Signed)
GYNECOLOGY ANNUAL PHYSICAL EXAM PROGRESS NOTE  Subjective:    Margaret Zuniga is a 36 y.o. [place] female who presents for an annual exam. The patient has no complaints today. The patient is sexually active. The patient wears seatbelts: yes. The patient participates in regular exercise: no. Has the patient ever been transfused or tattooed?: no. The patient reports that there is not domestic violence in her life.    Gynecologic History Menarche age: 41 No LMP recorded. Patient has had an injection. Contraception: Depo-Provera injections History of STI's:  Denies Last Pap: 07/2014. Results were: normal.  Denies h/o abnormal pap smears   Obstetric History   G0   P0   T0   P0   A0   L0    SAB0   TAB0   Ectopic0   Multiple0   Live Births0       Past Medical History:  Diagnosis Date  . Extrinsic asthma 08/15/2016  . 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 Hypertension   . 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 Personal history of traumatic brain injury 23  . Screening for cervical cancer May 2012   , reportedly normal  . Shoulder impingement 2009   surgical relesase, Dr. Marry Guan    Past Surgical History:  Procedure Laterality Date  . EYE SURGERY  1995  . LEG SURGERY  1985  . SUBACROMIAL DECOMPRESSION  2000   Right shoulder, [place]  . TONSILLECTOMY  2001    Family History  Problem Relation Age of Onset  . Diabetes Mother   . Coronary artery disease Mother   . Hyperlipidemia Mother   . Hypertension Mother   . Heart disease Maternal Grandfather     Social History   Socioeconomic History  . Marital status: Single    Spouse name: Not on file  . Number of children: Not on file  . Years of education: Not on file  . Highest education level: Not on file  Social Needs  . Financial resource strain: Not on file  . Food insecurity - worry: Not on file  .  Food insecurity - inability: Not on file  . Transportation needs - medical: Not on file  . Transportation needs - non-medical: Not on file  Occupational History  . Not on file  Tobacco Use  . Smoking status: Never Smoker  . Smokeless tobacco: Never Used  Substance and Sexual Activity  . Alcohol use: No  . Drug use: No  . Sexual activity: Yes    Birth control/protection: Injection  Other Topics Concern  . Not on file  Social History Narrative  . Not on file    Current Outpatient Medications on File Prior to Visit  Medication Sig Dispense Refill  . Acetaminophen-Caffeine (EXCEDRIN TENSION HEADACHE) 500-65 MG TABS Take by mouth.    Marland Kitchen albuterol (PROVENTIL HFA;VENTOLIN HFA) 108 (90 Base) MCG/ACT inhaler Inhale 2 puffs into the lungs every 6 (six) hours as needed for wheezing or shortness of breath. 1 Inhaler 2  . amLODipine (NORVASC) 2.5 MG tablet Take 1 tablet (2.5 mg total) by mouth daily. 90 tablet 3  . B Complex-C-Folic Acid (MULTIVITAMIN, STRESS FORMULA) tablet Take 1 tablet by mouth daily.      . baclofen (LIORESAL) 10 MG tablet Take 10 mg by mouth 2 (two) times daily. 2 days a week    .  carbamazepine (CARBATROL) 300 MG 12 hr capsule TAKE 1 CAPSULE BY ORAL ROUTE 3 TIMES A DAY FOR 30 DAYS  1  . cetirizine (ZYRTEC) 10 MG tablet Take 10 mg by mouth daily.    . diphenhydrAMINE (BENADRYL) 25 mg capsule Take 2 capsules (50 mg total) by mouth every 6 (six) hours as needed. 60 capsule 0  . docusate sodium (COLACE) 100 MG capsule Take 2 capsules (200 mg total) by mouth 2 (two) times daily. 120 capsule 0  . escitalopram (LEXAPRO) 10 MG tablet TAKE 1 TABLET (10 MG TOTAL) BY MOUTH DAILY. 90 tablet 1  . Fluticasone Furoate (ARNUITY ELLIPTA) 100 MCG/ACT AEPB Inhale 1 puff into the lungs daily. 30 each 5  . metoCLOPramide (REGLAN) 10 MG tablet Take 1 tablet (10 mg total) by mouth every 6 (six) hours as needed. 30 tablet 0  . metoprolol succinate (TOPROL-XL) 100 MG 24 hr tablet TAKE 1 TABLET (100  MG TOTAL) BY MOUTH DAILY. TAKE WITH OR IMMEDIATELY FOLLOWING A MEAL. 90 tablet 1  . senna (SENOKOT) 8.6 MG TABS tablet Take 2 tablets (17.2 mg total) by mouth 2 (two) times daily. (Patient taking differently: Take 2 tablets by mouth daily as needed. ) 120 each 0  . amoxicillin-clavulanate (AUGMENTIN) 875-125 MG tablet Take 1 tablet 2 (two) times daily by mouth. (Patient not taking: Reported on 10/24/2017) 14 tablet 0  . BOOSTRIX 5-2.5-18.5 injection TO BE ADMINISTERED BY PHARMACIST FOR IMMUNIZATION  0  . MedroxyPROGESTERone Acetate 150 MG/ML SUSY INJECT 1 ML (150 MG TOTAL) INTO THE MUSCLE EVERY 3 (THREE) MONTHS. (Patient not taking: Reported on 10/24/2017) 1 Syringe 3  . predniSONE (DELTASONE) 10 MG tablet 6 tablets all at once on Day 1 , then reduce by 1 tablet daily until gone (Patient not taking: Reported on 10/24/2017) 21 tablet 0   No current facility-administered medications on file prior to visit.     Allergies  Allergen Reactions  . Zonegran [Zonisamide] Rash     Review of Systems Constitutional: negative for chills, fatigue, fevers and sweats Eyes: negative for irritation, redness and visual disturbance Ears, nose, mouth, throat, and face: negative for hearing loss, nasal congestion, snoring and tinnitus Respiratory: negative for asthma, cough, sputum Cardiovascular: negative for chest pain, dyspnea, exertional chest pressure/discomfort, irregular heart beat, palpitations and syncope Gastrointestinal: negative for abdominal pain, change in bowel habits, nausea and vomiting Genitourinary: negative for abnormal menstrual periods, genital lesions, sexual problems and vaginal discharge, dysuria and urinary incontinence Integument/breast: negative for breast lump, breast tenderness and nipple discharge Hematologic/lymphatic: negative for bleeding and easy bruising Musculoskeletal:negative for back pain and muscle weakness Neurological: negative for dizziness, headaches, vertigo and  weakness Endocrine: negative for diabetic symptoms including polydipsia, polyuria and skin dryness Allergic/Immunologic: negative for hay fever and urticaria        Objective:  Blood pressure (!) 144/94, pulse 85, height 5\' 3"  (1.6 m), weight 190 lb 8 oz (86.4 kg). Body mass index is 33.75 kg/m.  General Appearance:    Alert, cooperative, no distress, appears stated age, moderately obese  Head:    Normocephalic, without obvious abnormality, atraumatic  Eyes:    PERRL, conjunctiva/corneas clear, EOM's intact, both eyes  Ears:    Normal external ear canals, both ears  Nose:   Nares normal, septum midline, mucosa normal, no drainage or sinus tenderness  Throat:   Lips, mucosa, and tongue normal; teeth and gums normal  Neck:   Supple, symmetrical, trachea midline, no adenopathy; thyroid: no enlargement/tenderness/nodules; no carotid bruit  or JVD  Back:     Symmetric, no curvature, ROM normal, no CVA tenderness  Lungs:     Clear to auscultation bilaterally, respirations unlabored  Chest Wall:    No tenderness or deformity   Heart:    Regular rate and rhythm, S1 and S2 normal, no murmur, rub or gallop  Breast Exam:    No tenderness, masses, or nipple abnormality  Abdomen:     Soft, non-tender, bowel sounds active all four quadrants, no masses, no organomegaly.    Genitalia:    Pelvic:external genitalia normal, vagina without lesions, discharge, or tenderness, rectovaginal septum  normal. Cervix normal in appearance, no cervical motion tenderness, no adnexal masses or tenderness.  Uterus normal size, shape, mobile, regular contours, nontender.  Rectal:    Normal external sphincter.  No hemorrhoids appreciated. Internal exam not done.   Extremities:   Extremities normal, atraumatic, no cyanosis or edema  Pulses:   2+ and symmetric all extremities  Skin:   Skin color, texture, turgor normal, no rashes or lesions  Lymph nodes:   Cervical, supraclavicular, and axillary nodes normal  Neurologic:   Grossly intact.  Partial paralysis of left side (face, upper and lower limb)   .  Labs:  Lab Results  Component Value Date   WBC 10.4 06/19/2017   HGB 13.8 06/19/2017   HCT 40.5 06/19/2017   MCV 90.1 06/19/2017   PLT 265 06/19/2017    Lab Results  Component Value Date   CREATININE 0.70 10/08/2017   BUN 15 10/08/2017   NA 143 10/08/2017   K 4.0 10/08/2017   CL 110 10/08/2017   CO2 24 10/08/2017    Lab Results  Component Value Date   ALT 32 10/08/2017   AST 16 10/08/2017   ALKPHOS 74 10/08/2017   BILITOT 0.4 10/08/2017    Lab Results  Component Value Date   TSH 2.88 10/08/2017    Lab Results  Component Value Date   HGBA1C 5.1 10/08/2017     Assessment:   Healthy female exam.  Obesity (BMI 31) HTN Contraception surveillance   Plan:    Labs: up to date, performed by PCP  Breast self exam technique reviewed and patient encouraged to perform self-exam monthly. Contraception: Depo-Provera injections.  Once patient has been on Depo for longer than 2 years, will need Dexa Scan next year.  Overall doing well on the injections.  Next injection due next month.  Discussed healthy lifestyle modifications. Pap smear performed today. Continue routine screening HTN  management as recommended by PCP.  Up to date on flu vaccine.  Follow up in 1 year.    Rubie Maid, MD [facility]

## 2017-10-24 NOTE — Patient Instructions (Addendum)

## 2017-10-29 LAB — IGP, COBASHPV16/18
HPV 16: NEGATIVE
HPV 18: NEGATIVE
HPV other hr types: NEGATIVE
PAP Smear Comment: 0

## 2017-10-31 ENCOUNTER — Other Ambulatory Visit: Payer: Self-pay

## 2017-10-31 MED ORDER — AMLODIPINE BESYLATE 2.5 MG PO TABS: 2.5000 mg | ORAL_TABLET | Freq: Every day | ORAL | 3 refills | Status: DC

## 2017-10-31 MED ORDER — METOPROLOL SUCCINATE ER 100 MG PO TB24: 100.0000 mg | ORAL_TABLET | Freq: Every day | ORAL | 1 refills | Status: DC

## 2017-10-31 MED ORDER — ESCITALOPRAM OXALATE 10 MG PO TABS: ORAL_TABLET | ORAL | 1 refills | Status: DC

## 2017-11-06 ENCOUNTER — Other Ambulatory Visit: Payer: Self-pay

## 2017-11-06 DIAGNOSIS — Z30013 Encounter for initial prescription of injectable contraceptive: Secondary | ICD-10-CM

## 2017-11-06 MED ORDER — MEDROXYPROGESTERONE ACETATE 150 MG/ML IM SUSY: 150.0000 mg | PREFILLED_SYRINGE | INTRAMUSCULAR | 3 refills | Status: DC

## 2017-11-21 DIAGNOSIS — G43719 Chronic migraine without aura, intractable, without status migrainosus: Secondary | ICD-10-CM | POA: Diagnosis not present

## 2017-11-21 DIAGNOSIS — R51 Headache: Secondary | ICD-10-CM | POA: Diagnosis not present

## 2017-11-21 DIAGNOSIS — M791 Myalgia, unspecified site: Secondary | ICD-10-CM | POA: Diagnosis not present

## 2017-11-21 DIAGNOSIS — M542 Cervicalgia: Secondary | ICD-10-CM | POA: Diagnosis not present

## 2017-11-26 ENCOUNTER — Ambulatory Visit (INDEPENDENT_AMBULATORY_CARE_PROVIDER_SITE_OTHER): Payer: Medicare Other | Admitting: Obstetrics and Gynecology

## 2017-11-26 VITALS — BP 128/92 | HR 71 | Wt 190.6 lb

## 2017-11-26 DIAGNOSIS — Z3042 Encounter for surveillance of injectable contraceptive: Secondary | ICD-10-CM | POA: Diagnosis not present

## 2017-11-26 MED ORDER — MEDROXYPROGESTERONE ACETATE 150 MG/ML IM SUSP
150.0000 mg | Freq: Once | INTRAMUSCULAR | Status: AC
  Administered 2017-11-26: 150 mg via INTRAMUSCULAR

## 2017-11-26 NOTE — Progress Notes (Signed)
Pt is here for depo provera inj She is doing well

## 2017-11-27 DIAGNOSIS — R945 Abnormal results of liver function studies: Secondary | ICD-10-CM | POA: Diagnosis not present

## 2018-01-07 DIAGNOSIS — R51 Headache: Secondary | ICD-10-CM | POA: Diagnosis not present

## 2018-01-07 DIAGNOSIS — M791 Myalgia, unspecified site: Secondary | ICD-10-CM | POA: Diagnosis not present

## 2018-01-07 DIAGNOSIS — M542 Cervicalgia: Secondary | ICD-10-CM | POA: Diagnosis not present

## 2018-01-07 DIAGNOSIS — G43719 Chronic migraine without aura, intractable, without status migrainosus: Secondary | ICD-10-CM | POA: Diagnosis not present

## 2018-02-11 ENCOUNTER — Ambulatory Visit (INDEPENDENT_AMBULATORY_CARE_PROVIDER_SITE_OTHER): Payer: Medicare Other | Admitting: Obstetrics and Gynecology

## 2018-02-11 VITALS — BP 125/90 | HR 76 | Wt 197.0 lb

## 2018-02-11 DIAGNOSIS — Z3042 Encounter for surveillance of injectable contraceptive: Secondary | ICD-10-CM

## 2018-02-11 MED ORDER — MEDROXYPROGESTERONE ACETATE 150 MG/ML IM SUSP
150.0000 mg | Freq: Once | INTRAMUSCULAR | Status: AC
  Administered 2018-02-11: 150 mg via INTRAMUSCULAR

## 2018-02-11 NOTE — Progress Notes (Signed)
Pt is here for depo provera inj, she is doing well Denies any complaints

## 2018-02-12 ENCOUNTER — Other Ambulatory Visit: Payer: Self-pay | Admitting: Internal Medicine

## 2018-02-20 NOTE — Progress Notes (Signed)
I have reviewed the record and concur with patient management and plan.  Zayvien Canning, MD [facility]     

## 2018-02-21 DIAGNOSIS — R51 Headache: Secondary | ICD-10-CM | POA: Diagnosis not present

## 2018-02-21 DIAGNOSIS — G43719 Chronic migraine without aura, intractable, without status migrainosus: Secondary | ICD-10-CM | POA: Diagnosis not present

## 2018-02-21 DIAGNOSIS — M542 Cervicalgia: Secondary | ICD-10-CM | POA: Diagnosis not present

## 2018-02-21 DIAGNOSIS — M791 Myalgia, unspecified site: Secondary | ICD-10-CM | POA: Diagnosis not present

## 2018-03-14 ENCOUNTER — Emergency Department: Payer: Medicare Other

## 2018-03-14 ENCOUNTER — Emergency Department
Admission: EM | Admit: 2018-03-14 | Discharge: 2018-03-14 | Disposition: A | Discharge status: Home / Self Care | Payer: Medicare Other | Attending: Emergency Medicine | Admitting: Emergency Medicine

## 2018-03-14 ENCOUNTER — Other Ambulatory Visit: Payer: Self-pay

## 2018-03-14 DIAGNOSIS — Z3202 Encounter for pregnancy test, result negative: Secondary | ICD-10-CM | POA: Diagnosis not present

## 2018-03-14 DIAGNOSIS — I10 Essential (primary) hypertension: Secondary | ICD-10-CM | POA: Diagnosis not present

## 2018-03-14 DIAGNOSIS — Z79899 Other long term (current) drug therapy: Secondary | ICD-10-CM | POA: Diagnosis not present

## 2018-03-14 DIAGNOSIS — Z85828 Personal history of other malignant neoplasm of skin: Secondary | ICD-10-CM | POA: Diagnosis not present

## 2018-03-14 DIAGNOSIS — N200 Calculus of kidney: Secondary | ICD-10-CM | POA: Diagnosis not present

## 2018-03-14 DIAGNOSIS — R109 Unspecified abdominal pain: Secondary | ICD-10-CM

## 2018-03-14 DIAGNOSIS — Z87820 Personal history of traumatic brain injury: Secondary | ICD-10-CM | POA: Insufficient documentation

## 2018-03-14 DIAGNOSIS — J45909 Unspecified asthma, uncomplicated: Secondary | ICD-10-CM | POA: Insufficient documentation

## 2018-03-14 LAB — BASIC METABOLIC PANEL
Anion gap: 7 (ref 5–15)
BUN: 14 mg/dL (ref 6–20)
CO2: 23 mmol/L (ref 22–32)
Calcium: 9.3 mg/dL (ref 8.9–10.3)
Chloride: 110 mmol/L (ref 101–111)
Creatinine, Ser: 0.58 mg/dL (ref 0.44–1.00)
GFR calc Af Amer: >60 mL/min (ref >60)
GFR calc non Af Amer: >60 mL/min (ref >60)
Glucose, Bld: 100 mg/dL — ABNORMAL HIGH (ref 65–99)
Potassium: 3.7 mmol/L (ref 3.5–5.1)
Sodium: 140 mmol/L (ref 135–145)

## 2018-03-14 LAB — URINALYSIS, COMPLETE (UACMP) WITH MICROSCOPIC
Bacteria, UA: NONE SEEN
Bilirubin Urine: NEGATIVE
Glucose, UA: NEGATIVE mg/dL
Hgb urine dipstick: NEGATIVE
Ketones, ur: NEGATIVE mg/dL
Leukocytes, UA: NEGATIVE
Nitrite: NEGATIVE
Protein, ur: NEGATIVE mg/dL
Specific Gravity, Urine: 1.016 (ref 1.005–1.030)
pH: 6 (ref 5.0–8.0)

## 2018-03-14 LAB — CBC
HCT: 40.7 % (ref 35.0–47.0)
Hemoglobin: 13.6 g/dL (ref 12.0–16.0)
MCH: 29.9 pg (ref 26.0–34.0)
MCHC: 33.3 g/dL (ref 32.0–36.0)
MCV: 89.7 fL (ref 80.0–100.0)
Platelets: 297 10*3/uL (ref 150–440)
RBC: 4.53 MIL/uL (ref 3.80–5.20)
RDW: 12.5 % (ref 11.5–14.5)
WBC: 4.9 10*3/uL (ref 3.6–11.0)

## 2018-03-14 LAB — PREGNANCY, URINE: Preg Test, Ur: NEGATIVE

## 2018-03-14 MED ORDER — KETOROLAC TROMETHAMINE 30 MG/ML IJ SOLN
15.0000 mg | Freq: Once | INTRAMUSCULAR | Status: AC
  Administered 2018-03-14: 15 mg via INTRAVENOUS
  Filled 2018-03-14: qty 1

## 2018-03-14 MED ORDER — CYCLOBENZAPRINE HCL 5 MG PO TABS: 5.0000 mg | ORAL_TABLET | Freq: Three times a day (TID) | ORAL | 0 refills | Status: DC | PRN

## 2018-03-14 MED ORDER — TRAMADOL HCL 50 MG PO TABS
50.0000 mg | ORAL_TABLET | Freq: Once | ORAL | Status: AC
  Administered 2018-03-14: 50 mg via ORAL
  Filled 2018-03-14: qty 1

## 2018-03-14 MED ORDER — IBUPROFEN 600 MG PO TABS: 600.0000 mg | ORAL_TABLET | Freq: Four times a day (QID) | ORAL | 0 refills | Status: DC | PRN

## 2018-03-14 NOTE — ED Triage Notes (Signed)
Pt c/o of R side pain x few weeks but today states pain became a lot worse. Denies N&V&D. Denies urinary symptoms. Denies constipation. Still has belly organs. Alert, oriented, in wheelchair.

## 2018-03-14 NOTE — ED Provider Notes (Signed)
[facility] Emergency Department Provider Note   ____________________________________________    I have reviewed the triage vital signs and the nursing notes.   HISTORY  Chief Complaint Flank Pain     HPI Margaret Zuniga is a 37 y.o. female who presents with complaints of right flank pain.  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  She reports over the last 24 hours she has developed pain in her right flank primarily, she is never had this before.  Denies a history of kidney stones.  Denies dysuria.  Normal bowel movements.  No fevers or chills.  No nausea or vomiting. has not take anything for this.  No history of abdominal surgery   Past Medical History:  Diagnosis Date  . Extrinsic asthma 08/15/2016  . 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 Hypertension   . 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 Personal history of traumatic brain injury 53  . Screening for cervical cancer May 2012   , reportedly normal  . Shoulder impingement 2009   surgical relesase, Dr. Marry Guan    Patient Active Problem List   Diagnosis Date Noted  . 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  . Obesity (BMI 30.0-34.9) 04/11/2017  . Elevated liver enzymes 04/11/2017  . Extrinsic asthma 08/15/2016  . Solitary pulmonary nodule 07/03/2016  . Essential hypertension, benign 04/04/2016  . H/O varicella 08/19/2015  . Viral URI 12/03/2014  . 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 Bilateral thoracic back pain 04/21/2014  . Encounter for preventive health examination 04/21/2014  . Major depressive disorder, recurrent episode, moderate ([place]) 05/21/2013  . Knee pain, chronic 03/26/2013  . Personal history of traumatic brain injury   . 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 Paralysis [facility])     Past  Surgical History:  Procedure Laterality Date  . EYE SURGERY  1995  . LEG SURGERY  1985  . SHOULDER SURGERY    . SUBACROMIAL DECOMPRESSION  2000   Right shoulder, [place]  . TONSILLECTOMY  2001    Prior to Admission medications   Medication Sig Start Date End Date Taking? Authorizing Provider  Acetaminophen-Caffeine (EXCEDRIN TENSION HEADACHE) 500-65 MG TABS Take by mouth.    [provider]  AIMOVIG 70 MG/ML SOAJ Inject 70 mg into the skin every 30 (thirty) days. 02/15/18   [provider]  albuterol (PROVENTIL HFA;VENTOLIN HFA) 108 (90 Base) MCG/ACT inhaler Inhale 2 puffs into the lungs every 6 (six) hours as needed for wheezing or shortness of breath. 06/04/17   Laverle Hobby, MD  amLODipine (NORVASC) 2.5 MG tablet Take 1 tablet (2.5 mg total) by mouth daily. 10/31/17   Crecencio Mc, MD  amoxicillin-clavulanate (AUGMENTIN) 875-125 MG tablet Take 1 tablet 2 (two) times daily by mouth. Patient not taking: Reported on 10/24/2017 10/16/17   Crecencio Mc, MD  ARNUITY ELLIPTA 100 MCG/ACT AEPB TAKE 1 PUFF BY MOUTH EVERY DAY 02/12/18   Laverle Hobby, MD  B Complex-C-Folic Acid (MULTIVITAMIN, STRESS FORMULA) tablet Take 1 tablet by mouth daily.      [provider]  baclofen (LIORESAL) 10 MG tablet Take 10 mg by mouth 2 (two) times daily. 2 days a week 11/26/12   [provider]  Gaastra 5-2.5-18.5 injection TO BE ADMINISTERED BY PHARMACIST FOR IMMUNIZATION  09/14/15   [provider]  carbamazepine (CARBATROL) 300 MG 12 hr capsule TAKE 1 CAPSULE BY ORAL ROUTE 3 TIMES A DAY FOR 30 DAYS 04/04/17   [provider]  cetirizine (ZYRTEC) 10 MG tablet Take 10 mg by mouth daily.    [provider]  diphenhydrAMINE (BENADRYL) 25 mg capsule Take 2 capsules (50 mg total) by mouth every 6 (six) hours as needed. 06/19/17   Carrie Mew, MD  docusate sodium (COLACE) 100 MG capsule Take 2 capsules (200 mg total) by mouth 2 (two)  times daily. 06/01/15   Carrie Mew, MD  escitalopram (LEXAPRO) 10 MG tablet TAKE 1 TABLET (10 MG TOTAL) BY MOUTH DAILY. 10/31/17   Crecencio Mc, MD  MedroxyPROGESTERone Acetate 150 MG/ML SUSY Inject 1 mL (150 mg total) into the muscle every 3 (three) months. 11/06/17   Rubie Maid, MD  metoCLOPramide (REGLAN) 10 MG tablet Take 1 tablet (10 mg total) by mouth every 6 (six) hours as needed. 06/19/17   Carrie Mew, MD  metoprolol succinate (TOPROL-XL) 100 MG 24 hr tablet Take 1 tablet (100 mg total) by mouth daily. Take with or immediately following a meal. 10/31/17   Crecencio Mc, MD  predniSONE (DELTASONE) 10 MG tablet 6 tablets all at once on Day 1 , then reduce by 1 tablet daily until gone Patient not taking: Reported on 10/24/2017 10/16/17   Crecencio Mc, MD  senna (SENOKOT) 8.6 MG TABS tablet Take 2 tablets (17.2 mg total) by mouth 2 (two) times daily. Patient not taking: Reported on 11/26/2017 06/01/15   Carrie Mew, MD     Allergies Zonegran [zonisamide]  Family History  Problem Relation Age of Onset  . Diabetes Mother   . Coronary artery disease Mother   . Hyperlipidemia Mother   . Hypertension Mother   . Heart disease Maternal Grandfather     Social History Social History   Tobacco Use  . Smoking status: Never Smoker  . Smokeless tobacco: Never Used  Substance Use Topics  . Alcohol use: No  . Drug use: No    Review of Systems  Constitutional: No fever/chills Eyes: No visual changes.  ENT: No sore throat. Cardiovascular: Denies chest pain. Respiratory: Denies shortness of breath. Gastrointestinal: As above Genitourinary: No hematuria Musculoskeletal: Negative for back pain. Skin: Negative for rash. Neurological: Negative for headaches    ____________________________________________   PHYSICAL EXAM:  VITAL SIGNS: ED Triage Vitals  Enc Vitals Group     BP 03/14/18 1208 (!) 148/90     Pulse Rate 03/14/18 1207 80     Resp 03/14/18  1207 18     Temp 03/14/18 1207 98.3 F (36.8 C)     Temp Source 03/14/18 1207 Oral     SpO2 03/14/18 1207 96 %     Weight 03/14/18 1208 81.6 kg (180 lb)     Height 03/14/18 1208 1.575 m (5\' 2" )     Head Circumference --      Peak Flow --      Pain Score 03/14/18 1208 10     Pain Loc --      Pain Edu? --      Excl. in [place]? --     Constitutional: Alert and oriented. Pleasant and interactive Eyes: Conjunctivae are normal.   Nose: No congestion/rhinnorhea. Mouth/Throat: Mucous membranes are moist.    Cardiovascular: Normal rate, regular rhythm. Grossly normal heart sounds.  Good peripheral circulation. Respiratory: Normal respiratory effort.  No retractions. Lungs CTAB. Gastrointestinal:  Soft and nontender. No distention.  No CVA tenderness.  Musculoskeletal: No lower extremity tenderness nor edema.  Warm and well perfused Neurologic:  Normal speech and language. No gross focal neurologic deficits are appreciated.  Skin:  Skin is warm, dry and intact. No rash noted. Psychiatric: Mood and affect are normal. Speech and behavior are normal.  ____________________________________________   LABS (all labs ordered are listed, but only abnormal results are displayed)  Labs Reviewed  URINALYSIS, COMPLETE (UACMP) WITH MICROSCOPIC - Abnormal; Notable for the following components:      Result Value   Color, Urine YELLOW (*)    APPearance CLEAR (*)    Squamous Epithelial / LPF 0-5 (*)    All other components within normal limits  BASIC METABOLIC PANEL - Abnormal; Notable for the following components:   Glucose, Bld 100 (*)    All other components within normal limits  CBC  PREGNANCY, URINE   ____________________________________________  EKG  None ____________________________________________  RADIOLOGY  CT renal stone study pending ____________________________________________   PROCEDURES  Procedure(s) performed: No  Procedures   Critical Care performed:  No ____________________________________________   INITIAL IMPRESSION / ASSESSMENT AND PLAN / ED COURSE  Pertinent labs & imaging results that were available during my care of the patient were reviewed by me and considered in my medical decision making (see chart for details).  Patient presents with complaint of right flank pain, no reports of dysuria or hematuria.  No history of kidney stones however this is  on the differential.  May also be musculoskeletal, abd exam is reassuring. We will give tramadol for pain while awaiting labs/ct renal stone study  ----------------------------------------- 3:25 PM on 03/14/2018 -----------------------------------------  Pending CT scan, have asked Dr. Alfred Levins to follow-up on this ____________________________________________   FINAL CLINICAL IMPRESSION(S) / ED DIAGNOSES  Final diagnoses:  Flank pain        Note:  This document was prepared using Dragon voice recognition software and may include unintentional dictation errors.    Lavonia Drafts, MD 03/14/18 1525

## 2018-03-14 NOTE — ED Notes (Signed)
Per Elsie Stain she will add on urine preg to urine in lab

## 2018-03-14 NOTE — ED Notes (Signed)
Patient transported to CT 

## 2018-03-14 NOTE — ED Provider Notes (Signed)
-----------------------------------------   5:49 PM on 03/14/2018 -----------------------------------------   Blood pressure (!) 141/94, pulse 64, temperature 98.3 F (36.8 C), temperature source Oral, resp. rate 17, height 5\' 2"  (1.575 m), weight 81.6 kg (180 lb), SpO2 99 %.  Assuming care from Dr. Corky Downs of Margaret Zuniga is a 37 y.o. female with a chief complaint of Flank Pain .    Please refer to H&P by previous MD for further details.  37 year old female who presents for several days of right flank pain which started when she was moving a heavy carport at her house.  Initially the pain was intermittent and has become constant.  Urinalysis and labs were within normal limits.  Dr. Corky Downs signed out to me pending CT flank to rule out kidney stone.  CT shows bilateral renal stones but no ureteral stone, no hydronephrosis.  Patient's pain improved significantly with Toradol.  At this time I discussed with the patient that my differential diagnosis is muscular skeletal pain versus a very small stone that was not seen on CT. She is PERC negative.  Patient is going to be discharged home on anti-inflammatory and muscle relaxants.  Discussed return precautions.  Recommend close follow-up with primary care doctor.    Alfred Levins, [place], MD 03/14/18 [phone]

## 2018-03-18 ENCOUNTER — Telehealth: Payer: Self-pay | Admitting: Internal Medicine

## 2018-03-18 NOTE — Telephone Encounter (Signed)
Pt was in the ED on 03/14/2018 for flank pain. Pt was in the office today with her mother and pt wanted to know if she could be sooner than 03/25/2018 for her ED follow up because she states that she is still having some flank pain. Pt stated to Caryl Pina that she did not want to be triaged again because she would be told to go back to the ED. Would it be okay to move pt up to an 11:30am spot or one of the available same day spots?

## 2018-03-18 NOTE — Telephone Encounter (Signed)
yes

## 2018-03-18 NOTE — Telephone Encounter (Signed)
Pt came in for an appt with her mother.. Pt wanted to move her appt up from 4/15 because she was still hurting offered several appts for this week and pt stayed that they didn't work for her.. Asked pt if she wanted to be triaged she stated no  we  would just send her to the ER Please advise pt

## 2018-03-19 NOTE — Telephone Encounter (Signed)
Can you please scheduled pt for 11:30am on Friday 03/22/2018.

## 2018-03-20 NOTE — Telephone Encounter (Signed)
Scheduled

## 2018-03-22 ENCOUNTER — Ambulatory Visit (INDEPENDENT_AMBULATORY_CARE_PROVIDER_SITE_OTHER): Payer: Medicare Other

## 2018-03-22 ENCOUNTER — Ambulatory Visit (INDEPENDENT_AMBULATORY_CARE_PROVIDER_SITE_OTHER): Payer: Medicare Other | Admitting: Internal Medicine

## 2018-03-22 ENCOUNTER — Telehealth: Payer: Self-pay | Admitting: Radiology

## 2018-03-22 ENCOUNTER — Other Ambulatory Visit: Payer: Self-pay

## 2018-03-22 ENCOUNTER — Encounter: Payer: Self-pay | Admitting: Internal Medicine

## 2018-03-22 VITALS — BP 116/80 | HR 90 | Temp 98.7°F | Wt 192.8 lb

## 2018-03-22 DIAGNOSIS — M546 Pain in thoracic spine: Secondary | ICD-10-CM

## 2018-03-22 DIAGNOSIS — N200 Calculus of kidney: Secondary | ICD-10-CM | POA: Diagnosis not present

## 2018-03-22 DIAGNOSIS — M4804 Spinal stenosis, thoracic region: Secondary | ICD-10-CM | POA: Diagnosis not present

## 2018-03-22 MED ORDER — IBUPROFEN 800 MG PO TABS: 800.0000 mg | ORAL_TABLET | Freq: Four times a day (QID) | ORAL | 0 refills | Status: DC | PRN

## 2018-03-22 MED ORDER — TRAMADOL HCL 50 MG PO TABS: 50.0000 mg | ORAL_TABLET | Freq: Three times a day (TID) | ORAL | 0 refills | Status: DC | PRN

## 2018-03-22 NOTE — Patient Instructions (Addendum)
Your exam suggests that the pain is coming from a muscle strain and spasm  Continue ibuprofen every 8 hours (I have refilled at 800 mg dose) and add tramadol every 8 hours for the next week    X rays today of thoracic spine today PT referral for massage and TENS unit application    Please strain your urine to find out if you are passing stones   Increase your water intake to 60 ounces daily

## 2018-03-22 NOTE — Telephone Encounter (Signed)
No, she was supposed to go home with a strainer , she was givne that ,  You can toss the urine

## 2018-03-22 NOTE — Telephone Encounter (Signed)
Ok pt was given strainer and urine will be tossed. Thank you.

## 2018-03-22 NOTE — Progress Notes (Signed)
Subjective:  Patient ID: Margaret Zuniga, female    DOB: [birth year]  Age: 37 y.o. MRN: [id number]  CC: The primary encounter diagnosis was Acute right-sided thoracic back pain. Diagnoses of Acute bilateral thoracic back pain and Bilateral nephrolithiasis were also pertinent to this visit.  HPI Margaret Zuniga presents for ER follow up.  Seen on April [facility] at [facility] for  Acute worsening of Right sided flank pain that had been present for several  Weeks ,  Started After pushing something heavy at home.  UA and labs were normal.  CT showed bilateral renal stones,  nonobstructing ,  No ureteral stones .  She was given toradol and tramadol with good relief of pain and sent home with a muscle relaxer and NSAID (ibuprofen) .   Has been taking both  With no relief of pain .   Tolerating muscle relaxer but not helping . Hot baths not helping,   Has tried icing as well   Has not been straining urine    Point of maximal tenderness Is lateral right at waist.  Radiates to other side often. Not constipated currenlty . No hematuria.    Outpatient Medications Prior to Visit  Medication Sig Dispense Refill  . Acetaminophen-Caffeine (EXCEDRIN TENSION HEADACHE) 500-65 MG TABS Take by mouth.    Marland Kitchen AIMOVIG 70 MG/ML SOAJ Inject 70 mg into the skin every 30 (thirty) days.  2  . albuterol (PROVENTIL HFA;VENTOLIN HFA) 108 (90 Base) MCG/ACT inhaler Inhale 2 puffs into the lungs every 6 (six) hours as needed for wheezing or shortness of breath. 1 Inhaler 2  . amLODipine (NORVASC) 2.5 MG tablet Take 1 tablet (2.5 mg total) by mouth daily. 90 tablet 3  . ARNUITY ELLIPTA 100 MCG/ACT AEPB TAKE 1 PUFF BY MOUTH EVERY DAY 30 each 2  . B Complex-C-Folic Acid (MULTIVITAMIN, STRESS FORMULA) tablet Take 1 tablet by mouth daily.      . baclofen (LIORESAL) 10 MG tablet Take 10 mg by mouth 2 (two) times daily. 2 days a week    . BOOSTRIX 5-2.5-18.5 injection TO BE ADMINISTERED BY PHARMACIST FOR IMMUNIZATION  0  . carbamazepine (CARBATROL) 300 MG 12  hr capsule TAKE 1 CAPSULE BY ORAL ROUTE 3 TIMES A DAY FOR 30 DAYS  1  . cetirizine (ZYRTEC) 10 MG tablet Take 10 mg by mouth daily.    . cyclobenzaprine (FLEXERIL) 5 MG tablet Take 1 tablet (5 mg total) by mouth 3 (three) times daily as needed for muscle spasms. 30 tablet 0  . diphenhydrAMINE (BENADRYL) 25 mg capsule Take 2 capsules (50 mg total) by mouth every 6 (six) hours as needed. 60 capsule 0  . docusate sodium (COLACE) 100 MG capsule Take 2 capsules (200 mg total) by mouth 2 (two) times daily. 120 capsule 0  . escitalopram (LEXAPRO) 10 MG tablet TAKE 1 TABLET (10 MG TOTAL) BY MOUTH DAILY. 90 tablet 1  . MedroxyPROGESTERone Acetate 150 MG/ML SUSY Inject 1 mL (150 mg total) into the muscle every 3 (three) months. 1 Syringe 3  . metoprolol succinate (TOPROL-XL) 100 MG 24 hr tablet Take 1 tablet (100 mg total) by mouth daily. Take with or immediately following a meal. 90 tablet 1  . senna (SENOKOT) 8.6 MG TABS tablet Take 2 tablets (17.2 mg total) by mouth 2 (two) times daily. 120 each 0  . ibuprofen (ADVIL,MOTRIN) 600 MG tablet Take 1 tablet (600 mg total) by mouth every 6 (six) hours as needed. 20 tablet 0  .  metoCLOPramide (REGLAN) 10 MG tablet Take 1 tablet (10 mg total) by mouth every 6 (six) hours as needed. (Patient not taking: Reported on 03/22/2018) 30 tablet 0  . amoxicillin-clavulanate (AUGMENTIN) 875-125 MG tablet Take 1 tablet 2 (two) times daily by mouth. (Patient not taking: Reported on 10/24/2017) 14 tablet 0  . predniSONE (DELTASONE) 10 MG tablet 6 tablets all at once on Day 1 , then reduce by 1 tablet daily until gone (Patient not taking: Reported on 03/22/2018) 21 tablet 0   No facility-administered medications prior to visit.     Review of Systems;  Patient denies headache, fevers, malaise, unintentional weight loss, skin rash, eye pain, sinus congestion and sinus pain, sore throat, dysphagia,  hemoptysis , cough, dyspnea, wheezing, chest pain, palpitations, orthopnea, edema,  abdominal pain, nausea, melena, diarrhea, constipation,, dysuria,hematuria, urinary  Frequency, nocturia, numbness, tingling, seizures,  Focal weakness, Loss of consciousness,  Tremor, insomnia, depression, anxiety, and suicidal ideation.      Objective:  BP 116/80 (BP Location: Left Arm, Patient Position: Sitting, Cuff Size: Normal)   Pulse 90   Temp 98.7 F (37.1 C)   Wt 192 lb 12.8 oz (87.5 kg)   SpO2 96%   BMI 35.26 kg/m   BP Readings from Last 3 Encounters:  03/22/18 116/80  03/14/18 (!) 141/94  02/11/18 125/90    Wt Readings from Last 3 Encounters:  03/22/18 192 lb 12.8 oz (87.5 kg)  03/14/18 180 lb (81.6 kg)  02/11/18 197 lb (89.4 kg)    General appearance: alert, cooperative and appears stated age Ears: normal TM's and external ear canals both ears Throat: lips, mucosa, and tongue normal; teeth and gums normal Neck: no adenopathy, no carotid bruit, supple, symmetrical, trachea midline and thyroid not enlarged, symmetric, no tenderness/mass/nodules Back: symmetric, no curvature. ROM normal. No CVA tenderness. Lungs: clear to auscultation bilaterally Heart: regular rate and rhythm, S1, S2 normal, no murmur, click, rub or gallop Abdomen: soft, non-tender; bowel sounds normal; no masses,  no organomegaly Back: tender to palpation on right side near CVA  Pulses: 2+ and symmetric Skin: Skin color, texture, turgor normal. No rashes or lesions Lymph nodes: Cervical, supraclavicular, and axillary nodes normal.  Lab Results  Component Value Date   HGBA1C 5.1 10/08/2017   HGBA1C 5.4 04/17/2016    Lab Results  Component Value Date   CREATININE 0.58 03/14/2018   CREATININE 0.70 10/08/2017   CREATININE 0.82 06/19/2017    Lab Results  Component Value Date   WBC 4.9 03/14/2018   HGB 13.6 03/14/2018   HCT 40.7 03/14/2018   PLT 297 03/14/2018   GLUCOSE 100 (H) 03/14/2018   CHOL 176 10/08/2017   TRIG 90.0 10/08/2017   HDL 51.70 10/08/2017   LDLDIRECT 109.0  04/17/2016   LDLCALC 106 (H) 10/08/2017   ALT 32 10/08/2017   AST 16 10/08/2017   NA 140 03/14/2018   K 3.7 03/14/2018   CL 110 03/14/2018   CREATININE 0.58 03/14/2018   BUN 14 03/14/2018   CO2 23 03/14/2018   TSH 2.88 10/08/2017   HGBA1C 5.1 10/08/2017   MICROALBUR <0.7 04/19/2016    Ct Renal Stone Study  Result Date: 03/14/2018 CLINICAL DATA:  RIGHT-side abdominal pain for few weeks worse today, history hypertension EXAM: CT ABDOMEN AND PELVIS WITHOUT CONTRAST TECHNIQUE: Multidetector CT imaging of the abdomen and pelvis was performed following the standard protocol without IV contrast. Sagittal and coronal MPR images reconstructed from axial data set. COMPARISON:  06/01/2015 FINDINGS: Lower chest: Minimal dependent  atelectasis RIGHT lung base Hepatobiliary: Liver and gallbladder normal appearance Pancreas: Normal appearance Spleen: Normal appearance Adrenals/Urinary Tract: Adrenal glands normal appearance. Tiny BILATERAL nonobstructing renal calculi. No renal mass, hydronephrosis or ureteral dilatation. Bladder unremarkable. Stomach/Bowel: Normal appendix. Stomach and bowel loops normal appearance. Vascular/Lymphatic: Unremarkable Reproductive: Suspect RIGHT-side uterine leiomyoma 2.9 x 2.3 cm image 80. Unremarkable adnexa. Other: No free air or free fluid. No hernia or acute inflammatory process. Musculoskeletal: Unremarkable IMPRESSION: Tiny BILATERAL nonobstructing renal calculi. Probable RIGHT-side uterine leiomyoma 2.9 x 2.3 cm. No acute intra-abdominal or intrapelvic abnormalities. Electronically Signed   By: Lavonia Dana M.D.   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   Plain x rays today ruled out vertebral compression fractures.  Adding tramadol to NSAID and MR.  PT referral to TENS Unit.         Relevant Medications   traMADol (ULTRAM) 50 MG tablet   ibuprofen (ADVIL,MOTRIN) 800 MG tablet   Bilateral nephrolithiasis    Encouraged to increase water intake and to strain urine.       Relevant Medications   traMADol (ULTRAM) 50 MG tablet    Other Visit Diagnoses    Acute right-sided thoracic back pain    -  Primary   Relevant Medications   traMADol (ULTRAM) 50 MG tablet   ibuprofen (ADVIL,MOTRIN) 800 MG tablet   Other Relevant Orders   DG Thoracic Spine W/Swimmers (Completed)   Ambulatory referral to Physical Therapy      I have discontinued Mikell R. Bergemann's predniSONE and amoxicillin-clavulanate. I have also changed her ibuprofen. Additionally, I am having her start on traMADol. Lastly, I am having her maintain her (multivitamin, stress formula), cetirizine, baclofen, docusate sodium, senna, BOOSTRIX, Acetaminophen-Caffeine, carbamazepine, albuterol, metoCLOPramide, diphenhydrAMINE, metoprolol succinate, escitalopram, amLODipine, medroxyPROGESTERone Acetate, ARNUITY ELLIPTA, AIMOVIG, and cyclobenzaprine.  Meds ordered this encounter  Medications  . traMADol (ULTRAM) 50 MG tablet    Sig: Take 1 tablet (50 mg total) by mouth every 8 (eight) hours as needed.    Dispense:  30 tablet    Refill:  0  . ibuprofen (ADVIL,MOTRIN) 800 MG tablet    Sig: Take 1 tablet (800 mg total) by mouth every 6 (six) hours as needed.    Dispense:  30 tablet    Refill:  0    A total of 25 minutes of face to face time was spent with patient more than half of which was spent in reviewing  All labs , imaging studies and progress notes from ER visit    Medications Discontinued During This Encounter  Medication Reason  . amoxicillin-clavulanate (AUGMENTIN) 875-125 MG tablet   . ibuprofen (ADVIL,MOTRIN) 600 MG tablet   . predniSONE (DELTASONE) 10 MG tablet     Follow-up: No follow-ups on file.   Crecencio Mc, MD

## 2018-03-22 NOTE — Telephone Encounter (Signed)
Pt left urine specimen in lab bathroom. Dont see any orders for urine. Would you like anything sent out for pt?

## 2018-03-24 DIAGNOSIS — N200 Calculus of kidney: Secondary | ICD-10-CM | POA: Insufficient documentation

## 2018-03-24 HISTORY — DX: Calculus of kidney: N20.0

## 2018-03-24 NOTE — Assessment & Plan Note (Addendum)
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   Plain x rays today ruled out vertebral compression fractures.  Adding tramadol to NSAID and MR.  PT referral to TENS Unit.

## 2018-03-24 NOTE — Assessment & Plan Note (Signed)
Encouraged to increase water intake and to strain urine.

## 2018-03-25 ENCOUNTER — Inpatient Hospital Stay: Payer: Medicare Other | Admitting: Internal Medicine

## 2018-04-08 DIAGNOSIS — G43719 Chronic migraine without aura, intractable, without status migrainosus: Secondary | ICD-10-CM | POA: Diagnosis not present

## 2018-04-08 DIAGNOSIS — M791 Myalgia, unspecified site: Secondary | ICD-10-CM | POA: Diagnosis not present

## 2018-04-08 DIAGNOSIS — R51 Headache: Secondary | ICD-10-CM | POA: Diagnosis not present

## 2018-04-08 DIAGNOSIS — M542 Cervicalgia: Secondary | ICD-10-CM | POA: Diagnosis not present

## 2018-04-10 ENCOUNTER — Encounter: Payer: Self-pay | Admitting: Physical Therapy

## 2018-04-10 ENCOUNTER — Ambulatory Visit: Payer: Medicare Other | Attending: Internal Medicine | Admitting: Physical Therapy

## 2018-04-10 DIAGNOSIS — R262 Difficulty in walking, not elsewhere classified: Secondary | ICD-10-CM

## 2018-04-10 DIAGNOSIS — M25551 Pain in right hip: Secondary | ICD-10-CM
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
## 2018-04-15 ENCOUNTER — Encounter: Payer: Self-pay | Admitting: Physical Therapy

## 2018-04-15 ENCOUNTER — Ambulatory Visit: Payer: Medicare Other | Admitting: Physical Therapy

## 2018-04-15 DIAGNOSIS — R262 Difficulty in walking, not elsewhere classified: Secondary | ICD-10-CM | POA: Diagnosis not present

## 2018-04-15 DIAGNOSIS — M25551 Pain in right hip: Secondary | ICD-10-CM | POA: Diagnosis not present
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
## 2018-04-16 ENCOUNTER — Encounter: Payer: Self-pay | Admitting: Internal Medicine

## 2018-04-16 ENCOUNTER — Ambulatory Visit (INDEPENDENT_AMBULATORY_CARE_PROVIDER_SITE_OTHER): Payer: Medicare Other | Admitting: Internal Medicine

## 2018-04-16 DIAGNOSIS — M546 Pain in thoracic spine: Secondary | ICD-10-CM | POA: Diagnosis not present

## 2018-04-16 MED ORDER — TIZANIDINE HCL 4 MG PO TABS: 4.0000 mg | ORAL_TABLET | Freq: Four times a day (QID) | ORAL | 2 refills | Status: DC | PRN

## 2018-04-16 NOTE — Patient Instructions (Signed)
Continue using the ibuprofen and tramadol.  I have added a muscle relaxer to help with the spasm,:  You can use it before the stretching exercises.  Start with 1/2 TABLET 1 HOUR BEFORE STRETCHING   IF YOUR PAIN ISN'T IMPROVED (IT WILL NOT BE RESOLVED) AFTER 4 MORE WEEKS LET ME KNOW

## 2018-04-16 NOTE — Progress Notes (Signed)
Subjective:  Patient ID: Margaret Zuniga, female    DOB: 09-Aug-1981  Age: 37 y.o. MRN: [id number]  CC: The encounter diagnosis was Acute bilateral thoracic back pain.  HPI [name] presents for follow up on right sided back pain ,  evaluated April 12 for persistent pain following  ER visit pn April 4  for same.  nonobstructing kidney stones seen on CT one by ER,  vertebral plain films done by me to rule out compression fracture.  Due to persistent pain,  Tramadol and NSAID was prescribed as well as PT referral for TENS Unit .  Recent trip to beach aggravated her pain after walking around the  shops,  did not participate in beach walking .  Pain is localized to across back but does not radiate to legs . No saddle parasthesias.  Has had   2 of 17 planned  PT evaluations . Last one yesterday  Pain rated as 8/10  Using the tramadol twice daily  ,  Using the ibuprofen three   Times daily .  Only uses baclofen for headaches,  Wants a different MR   Outpatient Medications Prior to Visit  Medication Sig Dispense Refill  . Acetaminophen-Caffeine (EXCEDRIN TENSION HEADACHE) 500-65 MG TABS Take by mouth.    Marland Kitchen albuterol (PROVENTIL HFA;VENTOLIN HFA) 108 (90 Base) MCG/ACT inhaler Inhale 2 puffs into the lungs every 6 (six) hours as needed for wheezing or shortness of breath. 1 Inhaler 2  . amLODipine (NORVASC) 2.5 MG tablet Take 1 tablet (2.5 mg total) by mouth daily. 90 tablet 3  . ARNUITY ELLIPTA 100 MCG/ACT AEPB TAKE 1 PUFF BY MOUTH EVERY DAY 30 each 2  . B Complex-C-Folic Acid (MULTIVITAMIN, STRESS FORMULA) tablet Take 1 tablet by mouth daily.      . baclofen (LIORESAL) 10 MG tablet Take 10 mg by mouth 2 (two) times daily. 2 days a week    . BOOSTRIX 5-2.5-18.5 injection TO BE ADMINISTERED BY PHARMACIST FOR IMMUNIZATION  0  . carbamazepine (CARBATROL) 300 MG 12 hr capsule TAKE 1 CAPSULE BY ORAL ROUTE 3 TIMES A DAY FOR 30 DAYS  1  . cetirizine (ZYRTEC) 10 MG tablet Take 10 mg by mouth daily.    .  cyclobenzaprine (FLEXERIL) 5 MG tablet Take 1 tablet (5 mg total) by mouth 3 (three) times daily as needed for muscle spasms. 30 tablet 0  . diphenhydrAMINE (BENADRYL) 25 mg capsule Take 2 capsules (50 mg total) by mouth every 6 (six) hours as needed. 60 capsule 0  . docusate sodium (COLACE) 100 MG capsule Take 2 capsules (200 mg total) by mouth 2 (two) times daily. 120 capsule 0  . EMGALITY 120 MG/ML SOAJ DISPENSE TWO 120 MG INJECTIONS. PATIENT TO BRING TO OFFICE FOR ADMINISTRATION  0  . escitalopram (LEXAPRO) 10 MG tablet TAKE 1 TABLET (10 MG TOTAL) BY MOUTH DAILY. 90 tablet 1  . ibuprofen (ADVIL,MOTRIN) 800 MG tablet Take 1 tablet (800 mg total) by mouth every 6 (six) hours as needed. 30 tablet 0  . MedroxyPROGESTERone Acetate 150 MG/ML SUSY Inject 1 mL (150 mg total) into the muscle every 3 (three) months. 1 Syringe 3  . metoCLOPramide (REGLAN) 10 MG tablet Take 1 tablet (10 mg total) by mouth every 6 (six) hours as needed. 30 tablet 0  . metoprolol succinate (TOPROL-XL) 100 MG 24 hr tablet Take 1 tablet (100 mg total) by mouth daily. Take with or immediately following a meal. 90 tablet 1  . senna (SENOKOT)  8.6 MG TABS tablet Take 2 tablets (17.2 mg total) by mouth 2 (two) times daily. 120 each 0  . traMADol (ULTRAM) 50 MG tablet Take 1 tablet (50 mg total) by mouth every 8 (eight) hours as needed. 30 tablet 0  . AIMOVIG 70 MG/ML SOAJ Inject 70 mg into the skin every 30 (thirty) days.  2   No facility-administered medications prior to visit.     Review of Systems;  Patient denies headache, fevers, malaise, unintentional weight loss, skin rash, eye pain, sinus congestion and sinus pain, sore throat, dysphagia,  hemoptysis , cough, dyspnea, wheezing, chest pain, palpitations, orthopnea, edema, abdominal pain, nausea, melena, diarrhea, constipation, flank pain, dysuria, hematuria, urinary  Frequency, nocturia, numbness, tingling, seizures,  Focal weakness, Loss of consciousness,  Tremor, insomnia,  depression, anxiety, and suicidal ideation.      Objective:  BP 128/84 (BP Location: Left Arm, Patient Position: Sitting, Cuff Size: Normal)   Pulse 94   Temp 98.2 F (36.8 C) (Oral)   Resp 15   Ht 5\' 2"  (1.575 m)   Wt 192 lb 12.8 oz (87.5 kg)   SpO2 96%   BMI 35.26 kg/m   BP Readings from Last 3 Encounters:  04/16/18 128/84  03/22/18 116/80  03/14/18 (!) 141/94    Wt Readings from Last 3 Encounters:  04/16/18 192 lb 12.8 oz (87.5 kg)  03/22/18 192 lb 12.8 oz (87.5 kg)  03/14/18 180 lb (81.6 kg)    General appearance: alert, cooperative and appears stated age Ears: normal TM's and external ear canals both ears Throat: lips, mucosa, and tongue normal; teeth and gums normal Neck: no adenopathy, no carotid bruit, supple, symmetrical, trachea midline and thyroid not enlarged, symmetric, no tenderness/mass/nodules Back: symmetric, no curvature. ROM normal. No CVA tenderness. Lungs: clear to auscultation bilaterally Heart: regular rate and rhythm, S1, S2 normal, no murmur, click, rub or gallop Abdomen: soft, non-tender; bowel sounds normal; no masses,  no organomegaly Pulses: 2+ and symmetric Skin: Skin color, texture, turgor normal. No rashes or lesions Lymph nodes: Cervical, supraclavicular, and axillary nodes normal.  Lab Results  Component Value Date   HGBA1C 5.1 10/08/2017   HGBA1C 5.4 04/17/2016    Lab Results  Component Value Date   CREATININE 0.58 03/14/2018   CREATININE 0.70 10/08/2017   CREATININE 0.82 06/19/2017    Lab Results  Component Value Date   WBC 4.9 03/14/2018   HGB 13.6 03/14/2018   HCT 40.7 03/14/2018   PLT 297 03/14/2018   GLUCOSE 100 (H) 03/14/2018   CHOL 176 10/08/2017   TRIG 90.0 10/08/2017   HDL 51.70 10/08/2017   LDLDIRECT 109.0 04/17/2016   LDLCALC 106 (H) 10/08/2017   ALT 32 10/08/2017   AST 16 10/08/2017   NA 140 03/14/2018   K 3.7 03/14/2018   CL 110 03/14/2018   CREATININE 0.58 03/14/2018   BUN 14 03/14/2018   CO2 23  03/14/2018   TSH 2.88 10/08/2017   HGBA1C 5.1 10/08/2017   MICROALBUR <0.7 04/19/2016    Ct Renal Stone Study  Result Date: 03/14/2018 CLINICAL DATA:  RIGHT-side abdominal pain for few weeks worse today, history hypertension EXAM: CT ABDOMEN AND PELVIS WITHOUT CONTRAST TECHNIQUE: Multidetector CT imaging of the abdomen and pelvis was performed following the standard protocol without IV contrast. Sagittal and coronal MPR images reconstructed from axial data set. COMPARISON:  06/01/2015 FINDINGS: Lower chest: Minimal dependent atelectasis RIGHT lung base Hepatobiliary: Liver and gallbladder normal appearance Pancreas: Normal appearance Spleen: Normal appearance Adrenals/Urinary Tract:  Adrenal glands normal appearance. Tiny BILATERAL nonobstructing renal calculi. No renal mass, hydronephrosis or ureteral dilatation. Bladder unremarkable. Stomach/Bowel: Normal appendix. Stomach and bowel loops normal appearance. Vascular/Lymphatic: Unremarkable Reproductive: Suspect RIGHT-side uterine leiomyoma 2.9 x 2.3 cm image 80. Unremarkable adnexa. Other: No free air or free fluid. No hernia or acute inflammatory process. Musculoskeletal: Unremarkable IMPRESSION: Tiny BILATERAL nonobstructing renal calculi. Probable RIGHT-side uterine leiomyoma 2.9 x 2.3 cm. No acute intra-abdominal or intrapelvic abnormalities. Electronically Signed   By: Lavonia Dana M.D.   On: 03/14/2018 15:47    Assessment & Plan:   Problem List Items Addressed This Visit    Bilateral thoracic back pain    Continue PT,  Tramadol and ibuprofen.  No radiating symptoms.  counselled patient that due to her acquired scoliosis she is likely to have chronic back pain and encouraged her to complete PT and focus on improving core strength       Relevant Medications   tiZANidine (ZANAFLEX) 4 MG tablet      I have discontinued Maudie Mercury R. Wagster's AIMOVIG. I am also having her start on tiZANidine. Additionally, I am having her maintain her (multivitamin,  stress formula), cetirizine, baclofen, docusate sodium, senna, BOOSTRIX, Acetaminophen-Caffeine, carbamazepine, albuterol, metoCLOPramide, diphenhydrAMINE, metoprolol succinate, escitalopram, amLODipine, medroxyPROGESTERone Acetate, ARNUITY ELLIPTA, cyclobenzaprine, traMADol, ibuprofen, and EMGALITY.  Meds ordered this encounter  Medications  . tiZANidine (ZANAFLEX) 4 MG tablet    Sig: Take 1 tablet (4 mg total) by mouth every 6 (six) hours as needed for muscle spasms.    Dispense:  60 tablet    Refill:  2    Medications Discontinued During This Encounter  Medication Reason  . AIMOVIG 70 MG/ML SOAJ Change in therapy    Follow-up: No follow-ups on file.   Crecencio Mc, MD

## 2018-04-17 ENCOUNTER — Encounter: Payer: Self-pay | Admitting: Physical Therapy

## 2018-04-17 ENCOUNTER — Ambulatory Visit: Payer: Medicare Other | Admitting: Physical Therapy

## 2018-04-17 DIAGNOSIS — R262 Difficulty in walking, not elsewhere classified: Secondary | ICD-10-CM | POA: Diagnosis not present

## 2018-04-17 DIAGNOSIS — M25551 Pain in right hip: Secondary | ICD-10-CM | POA: Diagnosis not present
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
## 2018-04-18 ENCOUNTER — Encounter: Payer: Self-pay | Admitting: Physical Therapy

## 2018-04-18 DIAGNOSIS — G43719 Chronic migraine without aura, intractable, without status migrainosus: Secondary | ICD-10-CM | POA: Diagnosis not present

## 2018-04-18 NOTE — Assessment & Plan Note (Signed)
Continue PT,  Tramadol and ibuprofen.  No radiating symptoms.  counselled patient that due to her acquired scoliosis she is likely to have chronic back pain and encouraged her to complete PT and focus on improving core strength

## 2018-04-23 ENCOUNTER — Ambulatory Visit: Payer: Medicare Other | Admitting: Physical Therapy

## 2018-04-25 ENCOUNTER — Encounter: Payer: Self-pay | Admitting: Physical Therapy

## 2018-04-25 ENCOUNTER — Ambulatory Visit: Payer: Medicare Other | Admitting: Physical Therapy

## 2018-04-25 DIAGNOSIS — M25551 Pain in right hip: Secondary | ICD-10-CM

## 2018-04-25 DIAGNOSIS — R262 Difficulty in walking, not elsewhere classified: Secondary | ICD-10-CM | POA: Diagnosis not present

## 2018-04-25 NOTE — Therapy (Deleted)
[facility] [address], [place], [address] Phone: [phone]   Fax:  [phone]  Apr 25, 2018   @[email or web address] @  Physical Therapy Discharge Summary  Patient: Margaret Zuniga  MRN: [id number]  Date of Birth: 07/22/1981   Diagnosis: No diagnosis found. Referring Provider: Dr. Derrel Nip   The above patient had been seen in Physical Therapy *** times of *** treatments scheduled with *** no shows and *** cancellations.  The treatment consisted of *** The patient is: {improved/worse/unchanged:[id number]}  Subjective: ***  Discharge Findings: ***  Functional Status at Discharge: ***  {[id number]}    Sincerely,   Shelton Silvas, PT   CC @[email or web address] @  [facility] [address], [place], [address] Phone: [phone]   Fax:  [phone]  Patient: Margaret Zuniga  MRN: [id number]  Date of Birth: 31-Aug-1981
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
## 2018-04-29 ENCOUNTER — Encounter: Payer: Self-pay | Admitting: Physical Therapy

## 2018-04-29 ENCOUNTER — Ambulatory Visit: Payer: Medicare Other | Admitting: Physical Therapy

## 2018-04-29 DIAGNOSIS — M25551 Pain in right hip: Secondary | ICD-10-CM

## 2018-04-29 DIAGNOSIS — R262 Difficulty in walking, not elsewhere classified: Secondary | ICD-10-CM | POA: Diagnosis not present
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
## 2018-04-30 ENCOUNTER — Ambulatory Visit (INDEPENDENT_AMBULATORY_CARE_PROVIDER_SITE_OTHER): Payer: Medicare Other | Admitting: Obstetrics and Gynecology

## 2018-04-30 VITALS — BP 118/88 | HR 90 | Wt 192.7 lb

## 2018-04-30 DIAGNOSIS — Z3042 Encounter for surveillance of injectable contraceptive: Secondary | ICD-10-CM

## 2018-04-30 MED ORDER — MEDROXYPROGESTERONE ACETATE 150 MG/ML IM SUSP
150.0000 mg | Freq: Once | INTRAMUSCULAR | Status: AC
  Administered 2018-04-30: 150 mg via INTRAMUSCULAR

## 2018-04-30 NOTE — Progress Notes (Signed)
I have reviewed the record and concur with patient management and plan.  Rose Hegner, MD [facility]     

## 2018-04-30 NOTE — Progress Notes (Signed)
Date last pap: 10/14/2017 Last Depo-Provera: 02/11/18 Side Effects if any: .na Serum HCG indicated? .na Depo-Provera 150 mg IM given by: .Keturah Barre, CMA Next appointment due .Aug 6-Aug 20th,2019 BP 118/88   Pulse 90   Wt 192 lb 11.2 oz (87.4 kg)   BMI 35.25 kg/m

## 2018-05-02 ENCOUNTER — Ambulatory Visit: Payer: Medicare Other | Admitting: Physical Therapy

## 2018-05-02 ENCOUNTER — Encounter: Payer: Self-pay | Admitting: Physical Therapy

## 2018-05-02 DIAGNOSIS — R262 Difficulty in walking, not elsewhere classified: Secondary | ICD-10-CM | POA: Diagnosis not present

## 2018-05-02 DIAGNOSIS — M25551 Pain in right hip: Secondary | ICD-10-CM
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
## 2018-05-03 ENCOUNTER — Other Ambulatory Visit: Payer: Self-pay | Admitting: Internal Medicine

## 2018-05-07 ENCOUNTER — Ambulatory Visit: Payer: Medicare Other | Admitting: Physical Therapy

## 2018-05-07 ENCOUNTER — Encounter: Payer: Self-pay | Admitting: Physical Therapy

## 2018-05-07 DIAGNOSIS — R262 Difficulty in walking, not elsewhere classified: Secondary | ICD-10-CM

## 2018-05-07 DIAGNOSIS — M25551 Pain in right hip: Secondary | ICD-10-CM | POA: Diagnosis not present

## 2018-05-07 NOTE — Telephone Encounter (Signed)
LMTCB. Need to let pt know that in order to get a refill of her Tramadol she will have to come by the office and sign a controlled substance policy. [facility] may speak with pt.

## 2018-05-07 NOTE — Telephone Encounter (Signed)
Refilled: 03/22/2018 Last OV: 04/16/2018 Next OV: 10/18/2018
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
## 2018-05-07 NOTE — Telephone Encounter (Signed)
Tramadol cannot be refilled without a controlled substance/narcotics contract .  Ii do not see one under "media" ; please advise if she has one and I will refill

## 2018-05-08 ENCOUNTER — Other Ambulatory Visit: Payer: Self-pay | Admitting: Internal Medicine

## 2018-05-08 MED ORDER — TRAMADOL HCL 50 MG PO TABS: 50.0000 mg | ORAL_TABLET | Freq: Every day | ORAL | 5 refills | Status: DC | PRN

## 2018-05-08 NOTE — Telephone Encounter (Signed)
Pt came in office today and signed a controlled substance agreement.

## 2018-05-09 ENCOUNTER — Encounter: Payer: Self-pay | Admitting: Physical Therapy

## 2018-05-09 ENCOUNTER — Ambulatory Visit: Payer: Medicare Other | Admitting: Physical Therapy

## 2018-05-09 DIAGNOSIS — M25551 Pain in right hip: Secondary | ICD-10-CM | POA: Diagnosis not present

## 2018-05-09 DIAGNOSIS — R262 Difficulty in walking, not elsewhere classified: Secondary | ICD-10-CM | POA: Diagnosis not present

## 2018-05-09 NOTE — Telephone Encounter (Signed)
Scripts faxed
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
## 2018-05-13 ENCOUNTER — Encounter: Payer: Medicare Other | Admitting: Physical Therapy

## 2018-05-14 ENCOUNTER — Ambulatory Visit: Payer: Medicare Other | Attending: Internal Medicine | Admitting: Physical Therapy

## 2018-05-14 ENCOUNTER — Encounter: Payer: Self-pay | Admitting: Physical Therapy

## 2018-05-14 DIAGNOSIS — M25551 Pain in right hip: Secondary | ICD-10-CM | POA: Diagnosis not present

## 2018-05-14 DIAGNOSIS — R262 Difficulty in walking, not elsewhere classified: Secondary | ICD-10-CM | POA: Insufficient documentation
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
## 2018-05-16 ENCOUNTER — Encounter: Payer: Self-pay | Admitting: Physical Therapy

## 2018-05-16 ENCOUNTER — Ambulatory Visit: Payer: Medicare Other | Admitting: Physical Therapy

## 2018-05-16 ENCOUNTER — Other Ambulatory Visit: Payer: Self-pay | Admitting: Internal Medicine

## 2018-05-16 DIAGNOSIS — M25551 Pain in right hip: Secondary | ICD-10-CM

## 2018-05-16 DIAGNOSIS — R262 Difficulty in walking, not elsewhere classified: Secondary | ICD-10-CM | POA: Diagnosis not present
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
## 2018-05-20 ENCOUNTER — Encounter: Payer: Medicare Other | Admitting: Physical Therapy

## 2018-05-20 DIAGNOSIS — G500 Trigeminal neuralgia: Secondary | ICD-10-CM | POA: Diagnosis not present

## 2018-05-20 DIAGNOSIS — M542 Cervicalgia: Secondary | ICD-10-CM | POA: Diagnosis not present

## 2018-05-20 DIAGNOSIS — M791 Myalgia, unspecified site: Secondary | ICD-10-CM | POA: Diagnosis not present

## 2018-05-20 DIAGNOSIS — G509 Disorder of trigeminal nerve, unspecified: Secondary | ICD-10-CM | POA: Diagnosis not present

## 2018-05-20 DIAGNOSIS — G43719 Chronic migraine without aura, intractable, without status migrainosus: Secondary | ICD-10-CM | POA: Diagnosis not present

## 2018-05-21 ENCOUNTER — Encounter: Payer: Self-pay | Admitting: Physical Therapy

## 2018-05-21 ENCOUNTER — Ambulatory Visit: Payer: Medicare Other | Admitting: Physical Therapy

## 2018-05-21 DIAGNOSIS — M25551 Pain in right hip: Secondary | ICD-10-CM | POA: Diagnosis not present

## 2018-05-21 DIAGNOSIS — R262 Difficulty in walking, not elsewhere classified: Secondary | ICD-10-CM | POA: Diagnosis not present
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
## 2018-05-22 ENCOUNTER — Encounter: Payer: Self-pay | Admitting: Physical Therapy

## 2018-05-22 ENCOUNTER — Ambulatory Visit: Payer: Medicare Other | Admitting: Physical Therapy

## 2018-05-22 DIAGNOSIS — M25551 Pain in right hip: Secondary | ICD-10-CM | POA: Diagnosis not present

## 2018-05-22 DIAGNOSIS — R262 Difficulty in walking, not elsewhere classified: Secondary | ICD-10-CM | POA: Diagnosis not present
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
## 2018-05-23 ENCOUNTER — Encounter: Payer: Medicare Other | Admitting: Physical Therapy

## 2018-05-27 ENCOUNTER — Ambulatory Visit: Payer: Medicare Other | Admitting: Physical Therapy

## 2018-05-27 ENCOUNTER — Encounter: Payer: Self-pay | Admitting: Physical Therapy

## 2018-05-27 DIAGNOSIS — M25551 Pain in right hip: Secondary | ICD-10-CM

## 2018-05-27 DIAGNOSIS — R262 Difficulty in walking, not elsewhere classified: Secondary | ICD-10-CM | POA: Diagnosis not present
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
## 2018-05-30 ENCOUNTER — Encounter: Payer: Self-pay | Admitting: Physical Therapy

## 2018-05-30 ENCOUNTER — Ambulatory Visit: Payer: Medicare Other | Admitting: Physical Therapy

## 2018-05-30 DIAGNOSIS — R262 Difficulty in walking, not elsewhere classified: Secondary | ICD-10-CM | POA: Diagnosis not present

## 2018-05-30 DIAGNOSIS — M25551 Pain in right hip: Secondary | ICD-10-CM | POA: Diagnosis not present
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
## 2018-06-03 ENCOUNTER — Ambulatory Visit: Payer: Medicare Other

## 2018-06-03 DIAGNOSIS — M25551 Pain in right hip: Secondary | ICD-10-CM

## 2018-06-03 DIAGNOSIS — R262 Difficulty in walking, not elsewhere classified: Secondary | ICD-10-CM
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
## 2018-06-04 ENCOUNTER — Encounter: Payer: Self-pay | Admitting: Internal Medicine

## 2018-06-04 ENCOUNTER — Ambulatory Visit (INDEPENDENT_AMBULATORY_CARE_PROVIDER_SITE_OTHER): Payer: Medicare Other | Admitting: Internal Medicine

## 2018-06-04 VITALS — BP 124/86 | HR 87 | Ht 62.0 in | Wt 194.0 lb

## 2018-06-04 DIAGNOSIS — J4520 Mild intermittent asthma, uncomplicated: Secondary | ICD-10-CM

## 2018-06-04 DIAGNOSIS — J4540 Moderate persistent asthma, uncomplicated: Secondary | ICD-10-CM | POA: Diagnosis not present

## 2018-06-04 NOTE — Patient Instructions (Signed)
Continue Arnuity inhaler and albuterol as needed.

## 2018-06-06 ENCOUNTER — Ambulatory Visit: Payer: Medicare Other | Admitting: Physical Therapy

## 2018-06-06 ENCOUNTER — Encounter: Payer: Self-pay | Admitting: Physical Therapy

## 2018-06-06 DIAGNOSIS — R0789 Other chest pain: Secondary | ICD-10-CM | POA: Diagnosis not present

## 2018-06-06 DIAGNOSIS — R262 Difficulty in walking, not elsewhere classified: Secondary | ICD-10-CM
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
## 2018-06-10 ENCOUNTER — Encounter: Payer: Self-pay | Admitting: Physical Therapy

## 2018-06-10 ENCOUNTER — Ambulatory Visit: Payer: Medicare Other | Attending: Internal Medicine | Admitting: Physical Therapy

## 2018-06-10 DIAGNOSIS — M25551 Pain in right hip: Secondary | ICD-10-CM | POA: Diagnosis not present

## 2018-06-10 DIAGNOSIS — R262 Difficulty in walking, not elsewhere classified: Secondary | ICD-10-CM | POA: Insufficient documentation
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
## 2018-06-12 ENCOUNTER — Encounter: Payer: Self-pay | Admitting: Physical Therapy

## 2018-06-12 ENCOUNTER — Ambulatory Visit: Payer: Medicare Other | Admitting: Physical Therapy

## 2018-06-12 DIAGNOSIS — R262 Difficulty in walking, not elsewhere classified: Secondary | ICD-10-CM

## 2018-06-12 DIAGNOSIS — M25551 Pain in right hip: Secondary | ICD-10-CM | POA: Diagnosis not present
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
## 2018-06-17 ENCOUNTER — Encounter: Payer: Self-pay | Admitting: Physical Therapy

## 2018-06-17 ENCOUNTER — Ambulatory Visit: Payer: Medicare Other | Admitting: Physical Therapy

## 2018-06-17 DIAGNOSIS — M25551 Pain in right hip: Secondary | ICD-10-CM | POA: Diagnosis not present

## 2018-06-17 DIAGNOSIS — R262 Difficulty in walking, not elsewhere classified: Secondary | ICD-10-CM
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
## 2018-06-20 ENCOUNTER — Ambulatory Visit: Payer: Medicare Other | Admitting: Physical Therapy

## 2018-06-20 ENCOUNTER — Encounter: Payer: Self-pay | Admitting: Physical Therapy

## 2018-06-20 DIAGNOSIS — R262 Difficulty in walking, not elsewhere classified: Secondary | ICD-10-CM

## 2018-06-20 DIAGNOSIS — M25551 Pain in right hip: Secondary | ICD-10-CM | POA: Diagnosis not present
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
## 2018-06-24 ENCOUNTER — Encounter: Payer: Self-pay | Admitting: Physical Therapy

## 2018-06-24 ENCOUNTER — Telehealth: Payer: Self-pay | Admitting: Internal Medicine

## 2018-06-24 ENCOUNTER — Ambulatory Visit: Payer: Medicare Other | Admitting: Physical Therapy

## 2018-06-24 DIAGNOSIS — R262 Difficulty in walking, not elsewhere classified: Secondary | ICD-10-CM | POA: Diagnosis not present

## 2018-06-24 DIAGNOSIS — R269 Unspecified abnormalities of gait and mobility: Secondary | ICD-10-CM

## 2018-06-24 DIAGNOSIS — M25551 Pain in right hip: Secondary | ICD-10-CM | POA: Diagnosis not present

## 2018-06-24 NOTE — Telephone Encounter (Signed)
Left message with [facility]. Please transfer to our office.
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
## 2018-06-24 NOTE — Telephone Encounter (Signed)
Copied from [place] [phone]. Topic: Quick Communication - Rx Refill/Question >> Jun 24, 2018 12:02 PM Oliver Pila B wrote: Medication: ORTHOTIC  [facility] called about getting an orthotic for the pt, contact [phone]

## 2018-06-27 ENCOUNTER — Ambulatory Visit: Payer: Medicare Other | Admitting: Physical Therapy

## 2018-06-27 ENCOUNTER — Encounter: Payer: Self-pay | Admitting: Physical Therapy

## 2018-06-27 DIAGNOSIS — M25551 Pain in right hip: Secondary | ICD-10-CM

## 2018-06-27 DIAGNOSIS — R262 Difficulty in walking, not elsewhere classified: Secondary | ICD-10-CM

## 2018-06-27 NOTE — Telephone Encounter (Signed)
Spoke with Shelton Silvas, PT at [facility] and she stated that she is treating the pt for hip pain but in order to treat it properly they need a DME order for the orthotic. The DME needs to state Orthotic or mobility stretcher for increased dorsi flexion.   This order needs to be faxed to [phone]

## 2018-06-27 NOTE — Telephone Encounter (Signed)
[facility] returned your call.

## 2018-06-27 NOTE — Telephone Encounter (Signed)
DME for orthotic ordered and signed per request form PT

## 2018-06-27 NOTE — Telephone Encounter (Signed)
Chelsey would like Janett Billow to call her back [phone].
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
## 2018-07-01 NOTE — Telephone Encounter (Signed)
Order was faxed on Thursday.

## 2018-07-02 DIAGNOSIS — R51 Headache: Secondary | ICD-10-CM | POA: Diagnosis not present

## 2018-07-02 DIAGNOSIS — M542 Cervicalgia: Secondary | ICD-10-CM | POA: Diagnosis not present

## 2018-07-02 DIAGNOSIS — M791 Myalgia, unspecified site: Secondary | ICD-10-CM | POA: Diagnosis not present

## 2018-07-02 DIAGNOSIS — G43719 Chronic migraine without aura, intractable, without status migrainosus: Secondary | ICD-10-CM | POA: Diagnosis not present

## 2018-07-03 ENCOUNTER — Ambulatory Visit: Payer: Medicare Other | Admitting: Physical Therapy

## 2018-07-03 ENCOUNTER — Encounter: Payer: Self-pay | Admitting: Physical Therapy

## 2018-07-03 DIAGNOSIS — M25551 Pain in right hip: Secondary | ICD-10-CM | POA: Diagnosis not present

## 2018-07-03 DIAGNOSIS — R262 Difficulty in walking, not elsewhere classified: Secondary | ICD-10-CM
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
## 2018-07-09 ENCOUNTER — Encounter: Payer: Self-pay | Admitting: Physical Therapy

## 2018-07-09 ENCOUNTER — Ambulatory Visit: Payer: Medicare Other | Admitting: Physical Therapy

## 2018-07-09 DIAGNOSIS — M25551 Pain in right hip: Secondary | ICD-10-CM | POA: Diagnosis not present

## 2018-07-09 DIAGNOSIS — R262 Difficulty in walking, not elsewhere classified: Secondary | ICD-10-CM
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
## 2018-07-15 ENCOUNTER — Other Ambulatory Visit: Payer: Self-pay | Admitting: Internal Medicine

## 2018-07-16 ENCOUNTER — Encounter: Payer: Medicare Other | Admitting: Physical Therapy

## 2018-07-19 ENCOUNTER — Ambulatory Visit: Payer: Medicare Other

## 2018-07-22 ENCOUNTER — Ambulatory Visit (INDEPENDENT_AMBULATORY_CARE_PROVIDER_SITE_OTHER): Payer: Medicare Other | Admitting: Obstetrics and Gynecology

## 2018-07-22 VITALS — BP 131/85 | HR 93 | Ht 62.0 in | Wt 197.0 lb

## 2018-07-22 DIAGNOSIS — Z3042 Encounter for surveillance of injectable contraceptive: Secondary | ICD-10-CM

## 2018-07-22 MED ORDER — MEDROXYPROGESTERONE ACETATE 150 MG/ML IM SUSP
150.0000 mg | Freq: Once | INTRAMUSCULAR | Status: AC
  Administered 2018-07-22: 150 mg via INTRAMUSCULAR

## 2018-07-22 NOTE — Progress Notes (Signed)
Date last pap: n/a. Last Depo-Provera: 04/30/2018. Side Effects if JUV:QQUI Serum HCG indicated? N/a. Depo-Provera 150 mg IM given by:CM Next appointment due 10/28---11/11.   BP 131/85   Pulse 93   Ht 5\' 2"  (1.575 m)   Wt 197 lb (89.4 kg)   LMP  (LMP Unknown)   BMI 36.03 kg/m

## 2018-07-23 ENCOUNTER — Ambulatory Visit: Payer: Medicare Other | Attending: Internal Medicine | Admitting: Physical Therapy

## 2018-07-23 ENCOUNTER — Encounter: Payer: Self-pay | Admitting: Physical Therapy

## 2018-07-23 DIAGNOSIS — R262 Difficulty in walking, not elsewhere classified: Secondary | ICD-10-CM | POA: Insufficient documentation
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
## 2018-07-25 DIAGNOSIS — M24571 Contracture, right ankle: Secondary | ICD-10-CM | POA: Diagnosis not present

## 2018-07-26 ENCOUNTER — Encounter: Payer: Self-pay | Admitting: Obstetrics and Gynecology

## 2018-07-26 NOTE — Telephone Encounter (Signed)
Thank you for order for prolonged stretching orthotic for night time use

## 2018-07-29 ENCOUNTER — Ambulatory Visit: Payer: Medicare Other | Admitting: Physical Therapy

## 2018-07-29 ENCOUNTER — Encounter: Payer: Self-pay | Admitting: Physical Therapy

## 2018-07-29 ENCOUNTER — Other Ambulatory Visit: Payer: Self-pay | Admitting: Internal Medicine

## 2018-07-29 DIAGNOSIS — R262 Difficulty in walking, not elsewhere classified: Secondary | ICD-10-CM | POA: Diagnosis not present
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
## 2018-08-05 ENCOUNTER — Ambulatory Visit: Payer: Medicare Other | Admitting: Physical Therapy

## 2018-08-05 ENCOUNTER — Encounter: Payer: Self-pay | Admitting: Physical Therapy

## 2018-08-05 DIAGNOSIS — R262 Difficulty in walking, not elsewhere classified: Secondary | ICD-10-CM
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
## 2018-08-13 ENCOUNTER — Ambulatory Visit: Payer: Medicare Other | Attending: Internal Medicine | Admitting: Physical Therapy

## 2018-08-13 ENCOUNTER — Encounter: Payer: Self-pay | Admitting: Physical Therapy

## 2018-08-13 DIAGNOSIS — R262 Difficulty in walking, not elsewhere classified: Secondary | ICD-10-CM
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
## 2018-08-20 ENCOUNTER — Ambulatory Visit: Payer: Medicare Other | Admitting: Physical Therapy

## 2018-08-20 ENCOUNTER — Encounter: Payer: Self-pay | Admitting: Physical Therapy

## 2018-08-20 DIAGNOSIS — M791 Myalgia, unspecified site: Secondary | ICD-10-CM | POA: Diagnosis not present

## 2018-08-20 DIAGNOSIS — G43719 Chronic migraine without aura, intractable, without status migrainosus: Secondary | ICD-10-CM | POA: Diagnosis not present

## 2018-08-20 DIAGNOSIS — R262 Difficulty in walking, not elsewhere classified: Secondary | ICD-10-CM | POA: Diagnosis not present

## 2018-08-20 DIAGNOSIS — R51 Headache: Secondary | ICD-10-CM | POA: Diagnosis not present

## 2018-08-20 DIAGNOSIS — M542 Cervicalgia: Secondary | ICD-10-CM | POA: Diagnosis not present
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
## 2018-08-27 ENCOUNTER — Ambulatory Visit: Payer: Medicare Other | Admitting: Physical Therapy

## 2018-08-27 ENCOUNTER — Encounter: Payer: Self-pay | Admitting: Physical Therapy

## 2018-08-27 DIAGNOSIS — R262 Difficulty in walking, not elsewhere classified: Secondary | ICD-10-CM
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
## 2018-09-03 ENCOUNTER — Ambulatory Visit: Payer: Medicare Other | Admitting: Physical Therapy

## 2018-09-03 ENCOUNTER — Encounter: Payer: Self-pay | Admitting: Physical Therapy

## 2018-09-03 DIAGNOSIS — R262 Difficulty in walking, not elsewhere classified: Secondary | ICD-10-CM | POA: Diagnosis not present
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
## 2018-09-06 ENCOUNTER — Ambulatory Visit (INDEPENDENT_AMBULATORY_CARE_PROVIDER_SITE_OTHER): Payer: Medicare Other

## 2018-09-06 VITALS — BP 126/82 | HR 79 | Temp 98.3°F | Resp 15 | Ht 63.0 in | Wt 195.1 lb

## 2018-09-06 DIAGNOSIS — Z0000 Encounter for general adult medical examination without abnormal findings: Secondary | ICD-10-CM
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
## 2018-09-06 NOTE — Patient Instructions (Addendum)
  Ms. Auer , Thank you for taking time to come for your Medicare Wellness Visit. I appreciate your ongoing commitment to your health goals. Please review the following plan we discussed and let me know if I can assist you in the future.   Follow up as needed.    Bring a copy of your [place] and/or Living Will to be scanned into chart upon completion.  Have a great day!  These are the goals we discussed: Goals      Patient Stated   . Lose weight (pt-stated)     Add protein supplemental drink to replace a meal or snack Continue to stay active  Eat a healthy diet       This is a list of the screening recommended for you and due dates:  Health Maintenance  Topic Date Due  . Pap Smear  10/24/2020  . Tetanus Vaccine  09/13/2025  . Flu Shot  Completed  . HIV Screening  Completed

## 2018-09-10 ENCOUNTER — Ambulatory Visit: Payer: Medicare Other | Attending: Internal Medicine | Admitting: Physical Therapy

## 2018-09-10 ENCOUNTER — Encounter: Payer: Self-pay | Admitting: Physical Therapy

## 2018-09-10 DIAGNOSIS — R262 Difficulty in walking, not elsewhere classified: Secondary | ICD-10-CM | POA: Diagnosis not present

## 2018-09-10 DIAGNOSIS — M25551 Pain in right hip: Secondary | ICD-10-CM | POA: Diagnosis not present
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
## 2018-09-17 ENCOUNTER — Encounter: Payer: Self-pay | Admitting: Physical Therapy

## 2018-09-17 ENCOUNTER — Ambulatory Visit: Payer: Medicare Other | Admitting: Physical Therapy

## 2018-09-17 DIAGNOSIS — R262 Difficulty in walking, not elsewhere classified: Secondary | ICD-10-CM | POA: Diagnosis not present

## 2018-09-17 DIAGNOSIS — M25551 Pain in right hip: Secondary | ICD-10-CM | POA: Diagnosis not present
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
## 2018-09-23 ENCOUNTER — Other Ambulatory Visit: Payer: Self-pay | Admitting: Obstetrics and Gynecology

## 2018-09-23 DIAGNOSIS — Z30013 Encounter for initial prescription of injectable contraceptive: Secondary | ICD-10-CM

## 2018-09-24 ENCOUNTER — Encounter: Payer: Medicare Other | Admitting: Physical Therapy

## 2018-09-25 ENCOUNTER — Ambulatory Visit: Payer: Medicare Other | Admitting: Physical Therapy

## 2018-09-25 ENCOUNTER — Encounter: Payer: Self-pay | Admitting: Physical Therapy

## 2018-09-25 DIAGNOSIS — R262 Difficulty in walking, not elsewhere classified: Secondary | ICD-10-CM | POA: Diagnosis not present

## 2018-09-25 DIAGNOSIS — M25551 Pain in right hip: Secondary | ICD-10-CM
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
## 2018-10-01 ENCOUNTER — Encounter: Payer: Self-pay | Admitting: Physical Therapy

## 2018-10-01 ENCOUNTER — Ambulatory Visit: Payer: Medicare Other

## 2018-10-01 DIAGNOSIS — R262 Difficulty in walking, not elsewhere classified: Secondary | ICD-10-CM

## 2018-10-01 DIAGNOSIS — M25551 Pain in right hip: Secondary | ICD-10-CM
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
## 2018-10-02 DIAGNOSIS — G43719 Chronic migraine without aura, intractable, without status migrainosus: Secondary | ICD-10-CM | POA: Diagnosis not present

## 2018-10-02 DIAGNOSIS — M791 Myalgia, unspecified site: Secondary | ICD-10-CM | POA: Diagnosis not present

## 2018-10-02 DIAGNOSIS — M542 Cervicalgia: Secondary | ICD-10-CM | POA: Diagnosis not present

## 2018-10-03 NOTE — Progress Notes (Signed)
Date last pap: NA Last Depo-Provera: 07/22/18 Side Effects if any: None Serum HCG indicated? NA Depo-Provera 150 mg IM given by: AS, CMA Next appointment due: 12/23/18-01/01/19 BP 126/88   Pulse 89   Ht 5\' 2"  (1.575 m)   Wt 191 lb (86.6 kg)   BMI 34.93 kg/m

## 2018-10-07 ENCOUNTER — Ambulatory Visit (INDEPENDENT_AMBULATORY_CARE_PROVIDER_SITE_OTHER): Payer: Medicare Other | Admitting: Obstetrics and Gynecology

## 2018-10-07 VITALS — BP 126/88 | HR 89 | Ht 62.0 in | Wt 191.0 lb

## 2018-10-07 DIAGNOSIS — Z3042 Encounter for surveillance of injectable contraceptive: Secondary | ICD-10-CM | POA: Diagnosis not present

## 2018-10-07 MED ORDER — MEDROXYPROGESTERONE ACETATE 150 MG/ML IM SUSP
150.0000 mg | Freq: Once | INTRAMUSCULAR | Status: AC
  Administered 2018-10-07: 150 mg via INTRAMUSCULAR

## 2018-10-10 ENCOUNTER — Encounter: Payer: Self-pay | Admitting: Physical Therapy

## 2018-10-10 ENCOUNTER — Ambulatory Visit: Payer: Medicare Other | Admitting: Physical Therapy

## 2018-10-10 DIAGNOSIS — R262 Difficulty in walking, not elsewhere classified: Secondary | ICD-10-CM | POA: Diagnosis not present

## 2018-10-10 DIAGNOSIS — M25551 Pain in right hip: Secondary | ICD-10-CM | POA: Diagnosis not present
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
## 2018-10-16 ENCOUNTER — Ambulatory Visit: Payer: Medicare Other | Attending: Internal Medicine | Admitting: Physical Therapy

## 2018-10-16 ENCOUNTER — Encounter: Payer: Self-pay | Admitting: Physical Therapy

## 2018-10-16 DIAGNOSIS — R262 Difficulty in walking, not elsewhere classified: Secondary | ICD-10-CM | POA: Insufficient documentation
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
## 2018-10-18 ENCOUNTER — Ambulatory Visit (INDEPENDENT_AMBULATORY_CARE_PROVIDER_SITE_OTHER): Payer: Medicare Other | Admitting: Internal Medicine

## 2018-10-18 ENCOUNTER — Encounter: Payer: Self-pay | Admitting: Internal Medicine

## 2018-10-18 VITALS — BP 124/86 | HR 15 | Temp 98.4°F | Resp 15 | Ht 62.0 in | Wt 200.8 lb

## 2018-10-18 DIAGNOSIS — M546 Pain in thoracic spine: Secondary | ICD-10-CM

## 2018-10-18 DIAGNOSIS — E669 Obesity, unspecified: Secondary | ICD-10-CM

## 2018-10-18 DIAGNOSIS — I10 Essential (primary) hypertension: Secondary | ICD-10-CM

## 2018-10-18 LAB — COMPREHENSIVE METABOLIC PANEL
ALT: 32 U/L (ref 0–35)
AST: 23 U/L (ref 0–37)
Albumin: 4.5 g/dL (ref 3.5–5.2)
Alkaline Phosphatase: 70 U/L (ref 39–117)
BUN: 20 mg/dL (ref 6–23)
CO2: 24 mEq/L (ref 19–32)
Calcium: 9.5 mg/dL (ref 8.4–10.5)
Chloride: 107 mEq/L (ref 96–112)
Creatinine, Ser: 0.81 mg/dL (ref 0.40–1.20)
GFR: 83.31 mL/min (ref >60.00)
Glucose, Bld: 94 mg/dL (ref 70–99)
Potassium: 4.1 mEq/L (ref 3.5–5.1)
Sodium: 141 mEq/L (ref 135–145)
Total Bilirubin: 0.3 mg/dL (ref 0.2–1.2)
Total Protein: 7.2 g/dL (ref 6.0–8.3)

## 2018-10-18 LAB — MICROALBUMIN / CREATININE URINE RATIO
Creatinine,U: 143.8 mg/dL
Microalb Creat Ratio: 0.6 mg/g (ref 0.0–30.0)
Microalb, Ur: 0.9 mg/dL (ref 0.0–1.9)

## 2018-10-18 MED ORDER — AMLODIPINE BESYLATE 5 MG PO TABS: 5.0000 mg | ORAL_TABLET | Freq: Every day | ORAL | 1 refills | Status: DC

## 2018-10-18 MED ORDER — MELOXICAM 15 MG PO TABS: 15.0000 mg | ORAL_TABLET | Freq: Every day | ORAL | 5 refills | Status: DC

## 2018-10-18 MED ORDER — TRAMADOL HCL 50 MG PO TABS: 100.0000 mg | ORAL_TABLET | Freq: Every day | ORAL | 5 refills | Status: DC | PRN

## 2018-10-18 NOTE — Progress Notes (Signed)
Subjective:  Patient ID: Margaret Zuniga, female    DOB: 21-Nov-1981  Age: 37 y.o. MRN: [id number]  CC: The primary encounter diagnosis was Essential hypertension. Diagnoses of Obesity (BMI 30.0-34.9), Essential hypertension, benign, and Acute bilateral thoracic back pain were also pertinent to this visit.  HPI Margaret Zuniga presents for follow up on chronic issues including back pain managed with tramadol and ibuprofen,  Obesity, and hypertension.      1) Back pain:  She was referred for PT in May: final visit was Nov 6,  She notes improved gait quality,, pain slightly better . States that she is stretching and heat as advised .  Using a cane to maintain  good  Postur.     2) Obesity:  She states that she has been losing  Weight using lifestyle  Modifications and dietary changes.  Drinking an Ensure max protein  As a meal replacement.  Walking daily for about 15 minutes   BAck does not hurt after walking   3) Hypertension: patient checks blood pressure twice weekly at home.  Readings have been for the most part > 140/80 at rest . Patient is following a reduce salt diet most days and is taking medications as prescribed   NEEDS TRAMADOL REFILL  MUSCLE RELAXERS TO USE AS NEEDED  AND excedrin   Lab Results  Component Value Date   MICROALBUR 0.9 10/18/2018     Outpatient Medications Prior to Visit  Medication Sig Dispense Refill  . Acetaminophen-Caffeine (EXCEDRIN TENSION HEADACHE) 500-65 MG TABS Take by mouth.    Marland Kitchen albuterol (PROVENTIL HFA;VENTOLIN HFA) 108 (90 Base) MCG/ACT inhaler Inhale 2 puffs into the lungs every 6 (six) hours as needed for wheezing or shortness of breath. 1 Inhaler 2  . ARNUITY ELLIPTA 100 MCG/ACT AEPB TAKE 1 PUFF BY MOUTH EVERY DAY 30 each 9  . B Complex-C-Folic Acid (MULTIVITAMIN, STRESS FORMULA) tablet Take 1 tablet by mouth daily.      . baclofen (LIORESAL) 10 MG tablet Take 10 mg by mouth 2 (two) times daily. 2 days a week    . carbamazepine (CARBATROL) 300 MG 12 hr  capsule TAKE 1 CAPSULE BY ORAL ROUTE 3 TIMES A DAY FOR 30 DAYS  1  . cetirizine (ZYRTEC) 10 MG tablet Take 10 mg by mouth daily.    . diphenhydrAMINE (BENADRYL) 25 mg capsule Take 2 capsules (50 mg total) by mouth every 6 (six) hours as needed. 60 capsule 0  . docusate sodium (COLACE) 100 MG capsule Take 2 capsules (200 mg total) by mouth 2 (two) times daily. 120 capsule 0  . escitalopram (LEXAPRO) 10 MG tablet TAKE 1 TABLET BY MOUTH EVERY DAY 90 tablet 0  . medroxyPROGESTERone Acetate 150 MG/ML SUSY INJECT 1 ML (150 MG TOTAL) INTO THE MUSCLE EVERY 3 (THREE) MONTHS. 1 Syringe 0  . metoprolol succinate (TOPROL-XL) 100 MG 24 hr tablet TAKE 1 TABLET (100 MG TOTAL) BY MOUTH DAILY. TAKE WITH OR IMMEDIATELY FOLLOWING A MEAL. 90 tablet 0  . senna (SENOKOT) 8.6 MG TABS tablet Take 2 tablets (17.2 mg total) by mouth 2 (two) times daily. 120 each 0  . tiZANidine (ZANAFLEX) 4 MG tablet Take 1 tablet (4 mg total) by mouth every 6 (six) hours as needed for muscle spasms. 60 tablet 2  . amLODipine (NORVASC) 2.5 MG tablet Take 1 tablet (2.5 mg total) by mouth daily. 90 tablet 3  . ibuprofen (ADVIL,MOTRIN) 800 MG tablet Take 1 tablet (800 mg total) by mouth every  6 (six) hours as needed. 30 tablet 0  . traMADol (ULTRAM) 50 MG tablet Take 2 tablets (100 mg total) by mouth daily as needed. 60 tablet 5  . AJOVY 225 MG/1.5ML SOSY PLEASE SEE ATTACHED FOR DETAILED DIRECTIONS  0  . EMGALITY 120 MG/ML SOAJ DISPENSE TWO 120 MG INJECTIONS. PATIENT TO BRING TO OFFICE FOR ADMINISTRATION  0   No facility-administered medications prior to visit.     Review of Systems;  Patient denies headache, fevers, malaise, unintentional weight loss, skin rash, eye pain, sinus congestion and sinus pain, sore throat, dysphagia,  hemoptysis , cough, dyspnea, wheezing, chest pain, palpitations, orthopnea, edema, abdominal pain, nausea, melena, diarrhea, constipation, flank pain, dysuria, hematuria, urinary  Frequency, nocturia, numbness,  tingling, seizures,  Focal weakness, Loss of consciousness,  Tremor, insomnia, depression, anxiety, and suicidal ideation.      Objective:  BP 124/86 (BP Location: Left Arm, Patient Position: Sitting, Cuff Size: Large)   Pulse (!) 15   Temp 98.4 F (36.9 C) (Oral)   Resp 15   Ht 5\' 2"  (1.575 m)   Wt 200 lb 12.8 oz (91.1 kg)   SpO2 96%   BMI 36.73 kg/m   BP Readings from Last 3 Encounters:  10/18/18 124/86  10/07/18 126/88  09/06/18 126/82    Wt Readings from Last 3 Encounters:  10/18/18 200 lb 12.8 oz (91.1 kg)  10/07/18 191 lb (86.6 kg)  09/06/18 195 lb 1.9 oz (88.5 kg)    General appearance: alert, cooperative and appears stated age Ears: normal TM's and external ear canals both ears Throat: lips, mucosa, and tongue normal; teeth and gums normal Neck: no adenopathy, no carotid bruit, supple, symmetrical, trachea midline and thyroid not enlarged, symmetric, no tenderness/mass/nodules Back: symmetric, no curvature. ROM normal. No CVA tenderness. Lungs: clear to auscultation bilaterally Heart: regular rate and rhythm, S1, S2 normal, no murmur, click, rub or gallop Abdomen: soft, non-tender; bowel sounds normal; no masses,  no organomegaly Pulses: 2+ and symmetric Skin: Skin color, texture, turgor normal. No rashes or lesions Lymph nodes: Cervical, supraclavicular, and axillary nodes normal.  Lab Results  Component Value Date   HGBA1C 5.1 10/08/2017   HGBA1C 5.4 04/17/2016    Lab Results  Component Value Date   CREATININE 0.81 10/18/2018   CREATININE 0.58 03/14/2018   CREATININE 0.70 10/08/2017    Lab Results  Component Value Date   WBC 4.9 03/14/2018   HGB 13.6 03/14/2018   HCT 40.7 03/14/2018   PLT 297 03/14/2018   GLUCOSE 94 10/18/2018   CHOL 176 10/08/2017   TRIG 90.0 10/08/2017   HDL 51.70 10/08/2017   LDLDIRECT 109.0 04/17/2016   LDLCALC 106 (H) 10/08/2017   ALT 32 10/18/2018   AST 23 10/18/2018   NA 141 10/18/2018   K 4.1 10/18/2018   CL 107  10/18/2018   CREATININE 0.81 10/18/2018   BUN 20 10/18/2018   CO2 24 10/18/2018   TSH 2.88 10/08/2017   HGBA1C 5.1 10/08/2017   MICROALBUR 0.9 10/18/2018    Ct Renal Stone Study  Result Date: 03/14/2018 CLINICAL DATA:  RIGHT-side abdominal pain for few weeks worse today, history hypertension EXAM: CT ABDOMEN AND PELVIS WITHOUT CONTRAST TECHNIQUE: Multidetector CT imaging of the abdomen and pelvis was performed following the standard protocol without IV contrast. Sagittal and coronal MPR images reconstructed from axial data set. COMPARISON:  06/01/2015 FINDINGS: Lower chest: Minimal dependent atelectasis RIGHT lung base Hepatobiliary: Liver and gallbladder normal appearance Pancreas: Normal appearance Spleen: Normal appearance Adrenals/Urinary  Tract: Adrenal glands normal appearance. Tiny BILATERAL nonobstructing renal calculi. No renal mass, hydronephrosis or ureteral dilatation. Bladder unremarkable. Stomach/Bowel: Normal appendix. Stomach and bowel loops normal appearance. Vascular/Lymphatic: Unremarkable Reproductive: Suspect RIGHT-side uterine leiomyoma 2.9 x 2.3 cm image 80. Unremarkable adnexa. Other: No free air or free fluid. No hernia or acute inflammatory process. Musculoskeletal: Unremarkable IMPRESSION: Tiny BILATERAL nonobstructing renal calculi. Probable RIGHT-side uterine leiomyoma 2.9 x 2.3 cm. No acute intra-abdominal or intrapelvic abnormalities. Electronically Signed   By: Lavonia Dana M.D.   On: 03/14/2018 15:47    Assessment & Plan:   Problem List Items Addressed This Visit    Bilateral thoracic back pain    Continue PT,  Tramadol and change ibuprofen to meloxicam for convenience.  No radiating symptoms.  counselled patient that due to her acquired scoliosis she is likely to have chronic back pain and encouraged her to continue stretching and exercising at home per previous instructions by  PT and focus on improving core strength       Relevant Medications   meloxicam (MOBIC)  15 MG tablet   traMADol (ULTRAM) 50 MG tablet   Essential hypertension, benign    Increase amlodipine dose to 5 mg daily       Relevant Medications   amLODipine (NORVASC) 5 MG tablet   Obesity (BMI 30.0-34.9)    I have encouraged her to reduce  BMI and  with goal of 10% of body weight (20lbs)  over the next 6 months using a low glycemic index diet and regular exercise a minimum of 5 days per week.         Other Visit Diagnoses    Essential hypertension    -  Primary   Relevant Medications   amLODipine (NORVASC) 5 MG tablet   Other Relevant Orders   Comprehensive metabolic panel (Completed)   Microalbumin / creatinine urine ratio (Completed)    A total of 25 minutes of face to face time was spent with patient more than half of which was spent in counselling about the above mentioned conditions  and coordination of care  I have discontinued Mika R. Buice's ibuprofen and EMGALITY. I have also changed her amLODipine. Additionally, I am having her start on meloxicam. Lastly, I am having her maintain her (multivitamin, stress formula), cetirizine, baclofen, docusate sodium, senna, Acetaminophen-Caffeine, carbamazepine, albuterol, diphenhydrAMINE, tiZANidine, ARNUITY ELLIPTA, metoprolol succinate, escitalopram, medroxyPROGESTERone Acetate, AJOVY, and traMADol.  Meds ordered this encounter  Medications  . meloxicam (MOBIC) 15 MG tablet    Sig: Take 1 tablet (15 mg total) by mouth daily.    Dispense:  30 tablet    Refill:  5  . amLODipine (NORVASC) 5 MG tablet    Sig: Take 1 tablet (5 mg total) by mouth daily.    Dispense:  90 tablet    Refill:  1  . traMADol (ULTRAM) 50 MG tablet    Sig: Take 2 tablets (100 mg total) by mouth daily as needed.    Dispense:  60 tablet    Refill:  5    Not to exceed 5 additional fills before 09/18/2018.    Medications Discontinued During This Encounter  Medication Reason  . EMGALITY 120 MG/ML SOAJ Error  . amLODipine (NORVASC) 2.5 MG tablet   .  traMADol (ULTRAM) 50 MG tablet Reorder  . ibuprofen (ADVIL,MOTRIN) 800 MG tablet     Follow-up: Return in about 6 months (around 04/18/2019).   Crecencio Mc, MD

## 2018-10-18 NOTE — Patient Instructions (Addendum)
WE are adding a trial of meloxicam to  use once daily if needed  instead of motrin ( motrin = advil,  Ibuprofen)   I am increasing the amlodipine  dose to 5 mg daily for your blood pressure  Goal is 120/70

## 2018-10-20 NOTE — Assessment & Plan Note (Signed)
Increase amlodipine dose to 5 mg daily

## 2018-10-20 NOTE — Assessment & Plan Note (Addendum)
I have encouraged her to reduce  BMI and  with goal of 10% of body weight (20lbs)  over the next 6 months using a low glycemic index diet and regular exercise a minimum of 5 days per week.

## 2018-10-20 NOTE — Assessment & Plan Note (Signed)
Continue PT,  Tramadol and change ibuprofen to meloxicam for convenience.  No radiating symptoms.  counselled patient that due to her acquired scoliosis she is likely to have chronic back pain and encouraged her to continue stretching and exercising at home per previous instructions by  PT and focus on improving core strength

## 2018-10-23 ENCOUNTER — Ambulatory Visit: Payer: Medicare Other | Admitting: Physical Therapy

## 2018-10-25 ENCOUNTER — Other Ambulatory Visit: Payer: Self-pay | Admitting: Internal Medicine

## 2018-10-26 ENCOUNTER — Other Ambulatory Visit: Payer: Self-pay | Admitting: Internal Medicine

## 2018-11-05 ENCOUNTER — Encounter: Payer: Self-pay | Admitting: Obstetrics and Gynecology

## 2018-11-05 ENCOUNTER — Ambulatory Visit (INDEPENDENT_AMBULATORY_CARE_PROVIDER_SITE_OTHER): Payer: Medicare Other | Admitting: Obstetrics and Gynecology

## 2018-11-05 ENCOUNTER — Other Ambulatory Visit (HOSPITAL_COMMUNITY)
Admission: RE | Admit: 2018-11-05 | Discharge: 2018-11-05 | Disposition: A | Discharge status: Home / Self Care | Payer: Medicare Other | Source: Ambulatory Visit | Attending: Obstetrics and Gynecology | Admitting: Obstetrics and Gynecology

## 2018-11-05 VITALS — BP 131/88 | HR 88 | Ht 63.0 in | Wt 196.9 lb

## 2018-11-05 DIAGNOSIS — Z01419 Encounter for gynecological examination (general) (routine) without abnormal findings: Secondary | ICD-10-CM

## 2018-11-05 DIAGNOSIS — E669 Obesity, unspecified: Secondary | ICD-10-CM

## 2018-11-05 DIAGNOSIS — Z3042 Encounter for surveillance of injectable contraceptive: Secondary | ICD-10-CM

## 2018-11-05 DIAGNOSIS — I10 Essential (primary) hypertension: Secondary | ICD-10-CM | POA: Diagnosis not present

## 2018-11-05 NOTE — Progress Notes (Signed)
  PT is present today for her annual exam. Pt stated that has not been doing monthly breast exams. Pt stated that she is doing well and denies any issues. No problems or concerns.

## 2018-11-05 NOTE — Progress Notes (Signed)
GYNECOLOGY ANNUAL PHYSICAL EXAM PROGRESS NOTE  Subjective:    Margaret Zuniga is a 37 y.o. [place] female who presents for an annual exam. The patient has no complaints today. The patient is sexually active. The patient wears seatbelts: yes. The patient participates in regular exercise: no. Has the patient ever been transfused or tattooed?: no. The patient reports that there is not domestic violence in her life.    Gynecologic History Menarche age: 1 No LMP recorded. Patient has had an injection. Contraception: Depo-Provera injections History of STI's:  Denies Last Pap: 10/2017. Results were: normal.  Denies h/o abnormal pap smears   OB History  Gravida Para Term Preterm AB Living  0 0 0 0 0 0  SAB TAB Ectopic Multiple Live Births  0 0 0 0 0    Past Medical History:  Diagnosis Date  . Extrinsic asthma 08/15/2016  . 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 Hypertension   . 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 Personal history of traumatic brain injury 15  . Shoulder impingement 2009   surgical relesase, Dr. Marry Guan    Past Surgical History:  Procedure Laterality Date  . EYE SURGERY  1995  . LEG SURGERY  1985  . SHOULDER SURGERY    . SUBACROMIAL DECOMPRESSION  2000   Right shoulder, [place]  . TONSILLECTOMY  2001    Family History  Problem Relation Age of Onset  . Diabetes Mother   . Coronary artery disease Mother   . Hyperlipidemia Mother   . Hypertension Mother   . Parkinson's disease Mother   . Heart disease Maternal Grandfather   . Healthy Father     Social History   Socioeconomic History  . Marital status: Single    Spouse name: Not on file  . Number of children: Not on file  . Years of education: Not on file  . Highest education level: Not on file  Occupational History  . Not on file  Social Needs  . Financial resource strain:  Not hard at all  . Food insecurity:    Worry: Never true    Inability: Never true  . Transportation needs:    Medical: No    Non-medical: No  Tobacco Use  . Smoking status: Never Smoker  . Smokeless tobacco: Never Used  Substance and Sexual Activity  . Alcohol use: No  . Drug use: No  . Sexual activity: Yes    Birth control/protection: Injection  Lifestyle  . Physical activity:    Days per week: Not on file    Minutes per session: Not on file  . Stress: Only a little  Relationships  . Social connections:    Talks on phone: Not on file    Gets together: Not on file    Attends religious service: Not on file    Active member of club or organization: Not on file    Attends meetings of clubs or organizations: Not on file    Relationship status: Never married  . Intimate partner violence:    Fear of current or ex partner: No    Emotionally abused: No    Physically abused: No    Forced sexual activity: No  Other Topics Concern  . Not on file  Social History Narrative  . Not on file    Current Outpatient Medications  on File Prior to Visit  Medication Sig Dispense Refill  . Acetaminophen-Caffeine (EXCEDRIN TENSION HEADACHE) 500-65 MG TABS Take by mouth.    . AJOVY 225 MG/1.5ML SOSY PLEASE SEE ATTACHED FOR DETAILED DIRECTIONS  0  . albuterol (PROVENTIL HFA;VENTOLIN HFA) 108 (90 Base) MCG/ACT inhaler Inhale 2 puffs into the lungs every 6 (six) hours as needed for wheezing or shortness of breath. 1 Inhaler 2  . amLODipine (NORVASC) 5 MG tablet Take 1 tablet (5 mg total) by mouth daily. 90 tablet 1  . ARNUITY ELLIPTA 100 MCG/ACT AEPB TAKE 1 PUFF BY MOUTH EVERY DAY 30 each 9  . B Complex-C-Folic Acid (MULTIVITAMIN, STRESS FORMULA) tablet Take 1 tablet by mouth daily.      . baclofen (LIORESAL) 10 MG tablet Take 10 mg by mouth 2 (two) times daily. 2 days a week    . carbamazepine (CARBATROL) 300 MG 12 hr capsule TAKE 1 CAPSULE BY ORAL ROUTE 3 TIMES A DAY FOR 30 DAYS  1  . cetirizine  (ZYRTEC) 10 MG tablet Take 10 mg by mouth daily.    . diphenhydrAMINE (BENADRYL) 25 mg capsule Take 2 capsules (50 mg total) by mouth every 6 (six) hours as needed. 60 capsule 0  . docusate sodium (COLACE) 100 MG capsule Take 2 capsules (200 mg total) by mouth 2 (two) times daily. 120 capsule 0  . escitalopram (LEXAPRO) 10 MG tablet TAKE 1 TABLET BY MOUTH EVERY DAY 90 tablet 0  . medroxyPROGESTERone Acetate 150 MG/ML SUSY INJECT 1 ML (150 MG TOTAL) INTO THE MUSCLE EVERY 3 (THREE) MONTHS. 1 Syringe 0  . meloxicam (MOBIC) 15 MG tablet Take 1 tablet (15 mg total) by mouth daily. 30 tablet 5  . metoprolol succinate (TOPROL-XL) 100 MG 24 hr tablet TAKE 1 TABLET (100 MG TOTAL) BY MOUTH DAILY. TAKE WITH OR IMMEDIATELY FOLLOWING A MEAL. 90 tablet 0  . senna (SENOKOT) 8.6 MG TABS tablet Take 2 tablets (17.2 mg total) by mouth 2 (two) times daily. 120 each 0  . tiZANidine (ZANAFLEX) 4 MG tablet Take 1 tablet (4 mg total) by mouth every 6 (six) hours as needed for muscle spasms. 60 tablet 2  . traMADol (ULTRAM) 50 MG tablet Take 2 tablets (100 mg total) by mouth daily as needed. 60 tablet 5   No current facility-administered medications on file prior to visit.     Allergies  Allergen Reactions  . Zonegran [Zonisamide] Rash     Review of Systems Constitutional: negative for chills, fatigue, fevers and sweats Eyes: negative for irritation, redness and visual disturbance Ears, nose, mouth, throat, and face: negative for hearing loss, nasal congestion, snoring and tinnitus Respiratory: negative for asthma, cough, sputum Cardiovascular: negative for chest pain, dyspnea, exertional chest pressure/discomfort, irregular heart beat, palpitations and syncope Gastrointestinal: negative for abdominal pain, change in bowel habits, nausea and vomiting Genitourinary: negative for abnormal menstrual periods, genital lesions, sexual problems and vaginal discharge, dysuria and urinary incontinence Integument/breast:  negative for breast lump, breast tenderness and nipple discharge Hematologic/lymphatic: negative for bleeding and easy bruising Musculoskeletal:negative for back pain and muscle weakness Neurological: negative for dizziness, headaches, vertigo and weakness Endocrine: negative for diabetic symptoms including polydipsia, polyuria and skin dryness Allergic/Immunologic: negative for hay fever and urticaria        Objective:  Blood pressure 131/88, pulse 88, height 5\' 3"  (1.6 m), weight 196 lb 14.4 oz (89.3 kg). Body mass index is 34.88 kg/m.  General Appearance:    Alert, cooperative, no distress, appears stated  age, moderately obese  Head:    Normocephalic, without obvious abnormality, atraumatic  Eyes:    PERRL, conjunctiva/corneas clear, EOM's intact, both eyes  Ears:    Normal external ear canals, both ears  Nose:   Nares normal, septum midline, mucosa normal, no drainage or sinus tenderness  Throat:   Lips, mucosa, and tongue normal; teeth and gums normal  Neck:   Supple, symmetrical, trachea midline, no adenopathy; thyroid: no enlargement/tenderness/nodules; no carotid bruit or JVD  Back:     Symmetric, no curvature, ROM normal, no CVA tenderness  Lungs:     Clear to auscultation bilaterally, respirations unlabored  Chest Wall:    No tenderness or deformity   Heart:    Regular rate and rhythm, S1 and S2 normal, no murmur, rub or gallop  Breast Exam:    No tenderness, masses, or nipple abnormality  Abdomen:     Soft, non-tender, bowel sounds active all four quadrants, no masses, no organomegaly.    Genitalia:    Pelvic:external genitalia normal, vagina without lesions, discharge, or tenderness, rectovaginal septum  normal. Cervix normal in appearance, no cervical motion tenderness, no adnexal masses or tenderness.  Uterus normal size, shape, mobile, regular contours, nontender.  Rectal:    Normal external sphincter.  No hemorrhoids appreciated. Internal exam not done.   Extremities:    Extremities normal, atraumatic, no cyanosis or edema  Pulses:   2+ and symmetric all extremities  Skin:   Skin color, texture, turgor normal, no rashes or lesions  Lymph nodes:   Cervical, supraclavicular, and axillary nodes normal  Neurologic:  Grossly intact.  Partial paralysis of left side (face, upper and lower limb)   .  Labs:  Lab Results  Component Value Date   WBC 4.9 03/14/2018   HGB 13.6 03/14/2018   HCT 40.7 03/14/2018   MCV 89.7 03/14/2018   PLT 297 03/14/2018    Lab Results  Component Value Date   CREATININE 0.81 10/18/2018   BUN 20 10/18/2018   NA 141 10/18/2018   K 4.1 10/18/2018   CL 107 10/18/2018   CO2 24 10/18/2018    Lab Results  Component Value Date   ALT 32 10/18/2018   AST 23 10/18/2018   ALKPHOS 70 10/18/2018   BILITOT 0.3 10/18/2018    Lab Results  Component Value Date   TSH 2.88 10/08/2017    Lab Results  Component Value Date   HGBA1C 5.1 10/08/2017     Assessment:   Healthy female exam.  Obesity (BMI 31) HTN Contraception surveillance   Plan:    Labs: up to date, performed by PCP  Breast self exam technique reviewed and patient encouraged to perform self-exam monthly. Contraception: Depo-Provera injections.  Will need Dexa Scan next year.  Overall doing well on the injections.  Next injection due next month.  Discussed healthy lifestyle modifications. Pap smear performed today. Continue routine screening HTN  management as recommended by PCP.  Up to date on flu vaccine.  To begin breast cancer screening with mammogram next year.  Follow up in 1 year.    Rubie Maid, MD [facility]

## 2018-11-06 ENCOUNTER — Encounter: Payer: Self-pay | Admitting: Obstetrics and Gynecology

## 2018-11-06 NOTE — Patient Instructions (Signed)

## 2018-11-12 LAB — CYTOLOGY - PAP
Adequacy: ABSENT
Diagnosis: NEGATIVE
HPV: NOT DETECTED

## 2018-11-14 DIAGNOSIS — G43719 Chronic migraine without aura, intractable, without status migrainosus: Secondary | ICD-10-CM | POA: Diagnosis not present

## 2018-11-18 DIAGNOSIS — G43719 Chronic migraine without aura, intractable, without status migrainosus: Secondary | ICD-10-CM | POA: Diagnosis not present

## 2018-11-27 DIAGNOSIS — M791 Myalgia, unspecified site: Secondary | ICD-10-CM | POA: Diagnosis not present

## 2018-11-27 DIAGNOSIS — M542 Cervicalgia: Secondary | ICD-10-CM | POA: Diagnosis not present

## 2018-11-27 DIAGNOSIS — G43719 Chronic migraine without aura, intractable, without status migrainosus: Secondary | ICD-10-CM | POA: Diagnosis not present

## 2018-12-09 ENCOUNTER — Other Ambulatory Visit: Payer: Self-pay | Admitting: Obstetrics and Gynecology

## 2018-12-09 DIAGNOSIS — Z30013 Encounter for initial prescription of injectable contraceptive: Secondary | ICD-10-CM

## 2018-12-23 ENCOUNTER — Ambulatory Visit (INDEPENDENT_AMBULATORY_CARE_PROVIDER_SITE_OTHER): Payer: Medicare Other | Admitting: Obstetrics and Gynecology

## 2018-12-23 VITALS — BP 114/82 | HR 84 | Wt 200.4 lb

## 2018-12-23 DIAGNOSIS — Z3042 Encounter for surveillance of injectable contraceptive: Secondary | ICD-10-CM | POA: Diagnosis not present

## 2018-12-23 MED ORDER — MEDROXYPROGESTERONE ACETATE 150 MG/ML IM SUSP
150.0000 mg | Freq: Once | INTRAMUSCULAR | Status: AC
  Administered 2018-12-23: 150 mg via INTRAMUSCULAR

## 2018-12-23 NOTE — Progress Notes (Signed)
Date last pap:11/05/18 . Last Depo-Provera: 10/07/18. Side Effects if any: NA. Serum HCG indicated? NA. Depo-Provera 150 mg IM given by: Shaune Pascal CMA. Next appointment due 03/11/2019 - 03/25/2019.

## 2018-12-24 ENCOUNTER — Encounter: Payer: Self-pay | Admitting: Obstetrics and Gynecology

## 2019-01-09 DIAGNOSIS — M791 Myalgia, unspecified site: Secondary | ICD-10-CM | POA: Diagnosis not present

## 2019-01-09 DIAGNOSIS — G43719 Chronic migraine without aura, intractable, without status migrainosus: Secondary | ICD-10-CM | POA: Diagnosis not present

## 2019-01-09 DIAGNOSIS — M542 Cervicalgia: Secondary | ICD-10-CM | POA: Diagnosis not present

## 2019-01-16 ENCOUNTER — Other Ambulatory Visit: Payer: Self-pay | Admitting: Internal Medicine

## 2019-02-11 ENCOUNTER — Telehealth: Payer: Self-pay | Admitting: Internal Medicine

## 2019-02-11 DIAGNOSIS — E782 Mixed hyperlipidemia: Secondary | ICD-10-CM

## 2019-02-11 DIAGNOSIS — I10 Essential (primary) hypertension: Secondary | ICD-10-CM

## 2019-02-11 NOTE — Telephone Encounter (Signed)
Pt says that Dr. Derrel Nip told her on her last labs that she sould have fasting labs done in 6 months. I do not see orders. Does she need fasting labs done?

## 2019-02-13 ENCOUNTER — Other Ambulatory Visit: Payer: Self-pay | Admitting: Internal Medicine

## 2019-02-13 DIAGNOSIS — R7301 Impaired fasting glucose: Secondary | ICD-10-CM

## 2019-02-13 NOTE — Telephone Encounter (Signed)
Added an a1c.  Thanks!

## 2019-02-13 NOTE — Telephone Encounter (Signed)
Per pt's last lab results, she is due to have repeat labs in May 2020. I have ordered CMP and lipid. Is there anything else that needs to be ordered?

## 2019-02-13 NOTE — Progress Notes (Signed)
a1c

## 2019-02-26 DIAGNOSIS — M791 Myalgia, unspecified site: Secondary | ICD-10-CM | POA: Diagnosis not present

## 2019-02-26 DIAGNOSIS — G43719 Chronic migraine without aura, intractable, without status migrainosus: Secondary | ICD-10-CM | POA: Diagnosis not present

## 2019-02-26 DIAGNOSIS — M542 Cervicalgia: Secondary | ICD-10-CM | POA: Diagnosis not present

## 2019-03-11 ENCOUNTER — Telehealth: Payer: Self-pay | Admitting: Surgical

## 2019-03-11 NOTE — Telephone Encounter (Signed)
Coronavirus (COVID-19) Are you at risk?  Are you at risk for the Coronavirus (COVID-19)?  To be considered HIGH RISK for Coronavirus (COVID-19), you have to meet the following criteria:  . Traveled to [place], [place], [place], [place] or [place]; or in the [place] to [place], [place], [place], or [place]; and have fever, cough, and shortness of breath within the last 2 weeks of travel OR . Been in close contact with a person diagnosed with COVID-19 within the last 2 weeks and have fever, cough, and shortness of breath . IF YOU DO NOT MEET THESE CRITERIA, YOU ARE CONSIDERED LOW RISK FOR COVID-19.  What to do if you are HIGH RISK for COVID-19?  . If you are having a medical emergency, call 911. . Seek medical care right away. Before you go to a doctor's office, urgent care or emergency department, call ahead and tell them about your recent travel, contact with someone diagnosed with COVID-19, and your symptoms. You should receive instructions from your physician's office regarding next steps of care.  . When you arrive at healthcare provider, tell the healthcare staff immediately you have returned from visiting [place], [place], [place], [place] or [place]; or traveled in the [place] to [place], [place], [place], or [place]; in the last two weeks or you have been in close contact with a person diagnosed with COVID-19 in the last 2 weeks.   . Tell the health care staff about your symptoms: fever, cough and shortness of breath. . After you have been seen by a medical provider, you will be either: o Tested for (COVID-19) and discharged home on quarantine except to seek medical care if symptoms worsen, and asked to  - Stay home and avoid contact with others until you get your results (4-5 days)  - Avoid travel on public transportation if possible (such as bus, train, or airplane) or o Sent to the Emergency Department by EMS for evaluation, COVID-19 testing, and possible  admission depending on your condition and test results.  What to do if you are LOW RISK for COVID-19?  Reduce your risk of any infection by using the same precautions used for avoiding the common cold or flu:  . Wash your hands often with soap and warm water for at least 20 seconds.  If soap and water are not readily available, use an alcohol-based hand sanitizer with at least 60% alcohol.  . If coughing or sneezing, cover your mouth and nose by coughing or sneezing into the elbow areas of your shirt or coat, into a tissue or into your sleeve (not your hands). . Avoid shaking hands with others and consider head nods or verbal greetings only. . Avoid touching your eyes, nose, or mouth with unwashed hands.  . Avoid close contact with people who are sick. . Avoid places or events with large numbers of people in one location, like concerts or sporting events. . Carefully consider travel plans you have or are making. . If you are planning any travel outside or inside the US, visit the CDC's Travelers' Health webpage for the latest health notices. . If you have some symptoms but not all symptoms, continue to monitor at home and seek medical attention if your symptoms worsen. . If you are having a medical emergency, call 911.   ADDITIONAL HEALTHCARE OPTIONS FOR PATIENTS  [facility] / e-Visit: [email or web address]         [facility]: [phone]  [facility]   Urgent Care: [facility]: [phone]    Patient has been prescreened. Robie Ridge CMA

## 2019-03-12 ENCOUNTER — Ambulatory Visit (INDEPENDENT_AMBULATORY_CARE_PROVIDER_SITE_OTHER): Payer: Medicare Other | Admitting: Obstetrics and Gynecology

## 2019-03-12 ENCOUNTER — Other Ambulatory Visit: Payer: Self-pay

## 2019-03-12 VITALS — BP 114/82 | HR 94 | Wt 198.5 lb

## 2019-03-12 DIAGNOSIS — Z3042 Encounter for surveillance of injectable contraceptive: Secondary | ICD-10-CM | POA: Diagnosis not present

## 2019-03-12 MED ORDER — MEDROXYPROGESTERONE ACETATE 150 MG/ML IM SUSP
150.0000 mg | Freq: Once | INTRAMUSCULAR | Status: AC
  Administered 2019-03-12: 14:00:00 150 mg via INTRAMUSCULAR

## 2019-03-12 NOTE — Progress Notes (Signed)
Date last pap: 11/26/20219. Last Depo-Provera: 12/23/2018 Side Effects if any: None. Serum HCG indicated? NA. Depo-Provera 150 mg IM given by: Robie Ridge CMA. Next appointment due 05-28-2019-06-11-2019.  Today's Vitals   03/12/19 1341  BP: 114/82  Pulse: 94  Weight: 198 lb 8 oz (90 kg)   Body mass index is 35.16 kg/m.

## 2019-04-01 ENCOUNTER — Other Ambulatory Visit: Payer: Self-pay | Admitting: Internal Medicine

## 2019-04-01 NOTE — Telephone Encounter (Signed)
Last OV 03/12/2019  Last refilled 10/18/2018 disp 30 with 5 refills   Next OV none scheduled   Sent to PCP for approval

## 2019-04-07 ENCOUNTER — Other Ambulatory Visit: Payer: Self-pay | Admitting: Internal Medicine

## 2019-04-09 DIAGNOSIS — M791 Myalgia, unspecified site: Secondary | ICD-10-CM | POA: Diagnosis not present

## 2019-04-09 DIAGNOSIS — G43719 Chronic migraine without aura, intractable, without status migrainosus: Secondary | ICD-10-CM | POA: Diagnosis not present

## 2019-04-09 DIAGNOSIS — M542 Cervicalgia: Secondary | ICD-10-CM | POA: Diagnosis not present

## 2019-04-15 ENCOUNTER — Other Ambulatory Visit: Payer: Self-pay | Admitting: Internal Medicine

## 2019-05-04 ENCOUNTER — Other Ambulatory Visit: Payer: Self-pay | Admitting: Internal Medicine

## 2019-05-14 DIAGNOSIS — G43719 Chronic migraine without aura, intractable, without status migrainosus: Secondary | ICD-10-CM | POA: Diagnosis not present

## 2019-05-14 DIAGNOSIS — M542 Cervicalgia: Secondary | ICD-10-CM | POA: Diagnosis not present

## 2019-05-14 DIAGNOSIS — M791 Myalgia, unspecified site: Secondary | ICD-10-CM | POA: Diagnosis not present

## 2019-05-15 ENCOUNTER — Other Ambulatory Visit: Payer: Self-pay

## 2019-05-15 ENCOUNTER — Other Ambulatory Visit (INDEPENDENT_AMBULATORY_CARE_PROVIDER_SITE_OTHER): Payer: Medicare Other

## 2019-05-15 DIAGNOSIS — I10 Essential (primary) hypertension: Secondary | ICD-10-CM | POA: Diagnosis not present

## 2019-05-15 DIAGNOSIS — E782 Mixed hyperlipidemia: Secondary | ICD-10-CM | POA: Diagnosis not present

## 2019-05-15 DIAGNOSIS — R7301 Impaired fasting glucose: Secondary | ICD-10-CM | POA: Diagnosis not present

## 2019-05-15 LAB — COMPREHENSIVE METABOLIC PANEL
ALT: 30 U/L (ref 0–35)
AST: 17 U/L (ref 0–37)
Albumin: 4.5 g/dL (ref 3.5–5.2)
Alkaline Phosphatase: 80 U/L (ref 39–117)
BUN: 13 mg/dL (ref 6–23)
CO2: 22 mEq/L (ref 19–32)
Calcium: 9.6 mg/dL (ref 8.4–10.5)
Chloride: 106 mEq/L (ref 96–112)
Creatinine, Ser: 0.74 mg/dL (ref 0.40–1.20)
GFR: 86.75 mL/min (ref >60.00)
Glucose, Bld: 89 mg/dL (ref 70–99)
Potassium: 3.7 mEq/L (ref 3.5–5.1)
Sodium: 139 mEq/L (ref 135–145)
Total Bilirubin: 0.4 mg/dL (ref 0.2–1.2)
Total Protein: 6.8 g/dL (ref 6.0–8.3)

## 2019-05-15 LAB — LIPID PANEL
Cholesterol: 181 mg/dL (ref 0–200)
HDL: 57.7 mg/dL (ref >39.00)
LDL Cholesterol: 103 mg/dL — ABNORMAL HIGH (ref 0–99)
NonHDL: 123.63
Total CHOL/HDL Ratio: 3
Triglycerides: 102 mg/dL (ref 0.0–149.0)
VLDL: 20.4 mg/dL (ref 0.0–40.0)

## 2019-05-15 LAB — HEMOGLOBIN A1C: Hgb A1c MFr Bld: 5.3 % (ref 4.6–6.5)

## 2019-05-19 ENCOUNTER — Other Ambulatory Visit: Payer: Self-pay | Admitting: Internal Medicine

## 2019-05-19 DIAGNOSIS — Z1231 Encounter for screening mammogram for malignant neoplasm of breast: Secondary | ICD-10-CM

## 2019-05-28 ENCOUNTER — Ambulatory Visit: Payer: Medicare Other

## 2019-05-28 ENCOUNTER — Ambulatory Visit: Payer: Medicare Other | Admitting: Internal Medicine

## 2019-05-28 DIAGNOSIS — G43719 Chronic migraine without aura, intractable, without status migrainosus: Secondary | ICD-10-CM | POA: Diagnosis not present

## 2019-05-30 ENCOUNTER — Telehealth: Payer: Self-pay

## 2019-05-30 NOTE — Telephone Encounter (Signed)
Pt prescreened no symptoms has face mask.   Coronavirus (COVID-19) Are you at risk?  Are you at risk for the Coronavirus (COVID-19)?  To be considered HIGH RISK for Coronavirus (COVID-19), you have to meet the following criteria:  . Traveled to [place], [place], [place], [place] or [place]; or in the [place] to [place], [place], [place], or [place]; and have fever, cough, and shortness of breath within the last 2 weeks of travel OR . Been in close contact with a person diagnosed with COVID-19 within the last 2 weeks and have fever, cough, and shortness of breath . IF YOU DO NOT MEET THESE CRITERIA, YOU ARE CONSIDERED LOW RISK FOR COVID-19.  What to do if you are HIGH RISK for COVID-19?  . If you are having a medical emergency, call 911. . Seek medical care right away. Before you go to a doctor's office, urgent care or emergency department, call ahead and tell them about your recent travel, contact with someone diagnosed with COVID-19, and your symptoms. You should receive instructions from your physician's office regarding next steps of care.  . When you arrive at healthcare provider, tell the healthcare staff immediately you have returned from visiting [place], [place], [place], [place] or [place]; or traveled in the [place] to [place], [place], [place], or [place]; in the last two weeks or you have been in close contact with a person diagnosed with COVID-19 in the last 2 weeks.   . Tell the health care staff about your symptoms: fever, cough and shortness of breath. . After you have been seen by a medical provider, you will be either: o Tested for (COVID-19) and discharged home on quarantine except to seek medical care if symptoms worsen, and asked to  - Stay home and avoid contact with others until you get your results (4-5 days)  - Avoid travel on public transportation if possible (such as bus, train, or airplane) or o Sent to the Emergency Department by EMS for  evaluation, COVID-19 testing, and possible admission depending on your condition and test results.  What to do if you are LOW RISK for COVID-19?  Reduce your risk of any infection by using the same precautions used for avoiding the common cold or flu:  . Wash your hands often with soap and warm water for at least 20 seconds.  If soap and water are not readily available, use an alcohol-based hand sanitizer with at least 60% alcohol.  . If coughing or sneezing, cover your mouth and nose by coughing or sneezing into the elbow areas of your shirt or coat, into a tissue or into your sleeve (not your hands). . Avoid shaking hands with others and consider head nods or verbal greetings only. . Avoid touching your eyes, nose, or mouth with unwashed hands.  . Avoid close contact with people who are sick. . Avoid places or events with large numbers of people in one location, like concerts or sporting events. . Carefully consider travel plans you have or are making. . If you are planning any travel outside or inside the [place], visit the CDC's Travelers' Health webpage for the latest health notices. . If you have some symptoms but not all symptoms, continue to monitor at home and seek medical attention if your symptoms worsen. . If you are having a medical emergency, call 911.   ADDITIONAL HEALTHCARE OPTIONS FOR PATIENTS  [facility] / e-Visit: [email or web address]           [facility]: [phone]  [facility]: [phone]                   [facility]: [phone]  

## 2019-06-02 ENCOUNTER — Other Ambulatory Visit: Payer: Self-pay

## 2019-06-02 ENCOUNTER — Ambulatory Visit (INDEPENDENT_AMBULATORY_CARE_PROVIDER_SITE_OTHER): Payer: Medicare Other | Admitting: Obstetrics and Gynecology

## 2019-06-02 VITALS — BP 110/70 | HR 89 | Ht 63.0 in | Wt 203.3 lb

## 2019-06-02 DIAGNOSIS — Z3042 Encounter for surveillance of injectable contraceptive: Secondary | ICD-10-CM | POA: Diagnosis not present

## 2019-06-02 MED ORDER — MEDROXYPROGESTERONE ACETATE 150 MG/ML IM SUSP
150.0000 mg | Freq: Once | INTRAMUSCULAR | Status: AC
  Administered 2019-06-02: 150 mg via INTRAMUSCULAR

## 2019-06-02 NOTE — Progress Notes (Signed)
Date last pap: 11/05/2018 Last Depo-Provera: 03/12/2019 Side Effects if any: NA Serum HCG indicated? NA Depo-Provera 150 mg IM given by: [facility] CMA Next appointment due Sept 7-21 2020.   Vitals:   06/02/19 1338  BP: 110/70  Pulse: 89

## 2019-06-03 ENCOUNTER — Ambulatory Visit (INDEPENDENT_AMBULATORY_CARE_PROVIDER_SITE_OTHER): Payer: Medicare Other | Admitting: Internal Medicine

## 2019-06-03 ENCOUNTER — Encounter: Payer: Self-pay | Admitting: Internal Medicine

## 2019-06-03 DIAGNOSIS — Z7189 Other specified counseling: Secondary | ICD-10-CM | POA: Diagnosis not present

## 2019-06-03 DIAGNOSIS — M25571 Pain in right ankle and joints of right foot: Secondary | ICD-10-CM | POA: Diagnosis not present

## 2019-06-03 NOTE — Assessment & Plan Note (Signed)
Educated patient on the newly broadened list of signs and symptoms of COVID-19 infection and ways to avoid the viral infection including washing hands frequently with soap and water,  using hand sanitizer if unable to wash, avoiding touching face,  staying at home and limiting visitors,  and avoiding contact with people coming in and out of home.  Discussed the potential ineffectiveness of hand sanitizer if left in environments > 110 degrees (ie , the car).  Reminded patient to call office with questions/concerns.  The importance of continued social distancing was discussed today . Patient was screened for the development of any unsafe behaviors or habits that may have developed as a result of the social impact of the virus , including alcohol abuse,  Domestic violence, tobacco abuse and overeating.

## 2019-06-03 NOTE — Patient Instructions (Addendum)
Your ankle pain may be due to bone spurs and arthritis.   You can use meloxicam 15 mg once daily OR ibuprofen 600 mg every 8 hours  . You can ADD up to 2000 mg of acetominophen (tylenol) every day safely  In divided doses (500 mg every 6 hours  Or 1000 mg every 12 hours.)   I am making a referral to [facility] here in [place]  For further evaluation

## 2019-06-03 NOTE — Progress Notes (Signed)
Pt stated that the right ankle pain is not any better at all.
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
## 2019-06-03 NOTE — Assessment & Plan Note (Addendum)
In the absence of any injury ,  Bruising or swelling . Fracture and sprain are unlikely .  Suspect bone spurring from OA given her contracture.  Referral to podiatry for evaluation.  Will resume meloxicam  and tylenol
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
## 2019-06-05 ENCOUNTER — Ambulatory Visit (INDEPENDENT_AMBULATORY_CARE_PROVIDER_SITE_OTHER): Payer: Medicare Other | Admitting: Internal Medicine

## 2019-06-05 DIAGNOSIS — J4520 Mild intermittent asthma, uncomplicated: Secondary | ICD-10-CM

## 2019-06-05 DIAGNOSIS — J4540 Moderate persistent asthma, uncomplicated: Secondary | ICD-10-CM | POA: Diagnosis not present

## 2019-06-05 MED ORDER — ALBUTEROL SULFATE HFA 108 (90 BASE) MCG/ACT IN AERS: 2.0000 | INHALATION_SPRAY | Freq: Four times a day (QID) | RESPIRATORY_TRACT | 5 refills | Status: DC | PRN

## 2019-06-05 NOTE — Patient Instructions (Signed)
Continue using Arnuity inhaler once daily, rinse mouth after use. I refilled your pro-air inhaler today, continue to use it as needed when you have trouble breathing.

## 2019-06-19 DIAGNOSIS — F32A Depression, unspecified: Secondary | ICD-10-CM | POA: Insufficient documentation

## 2019-06-19 DIAGNOSIS — F329 Major depressive disorder, single episode, unspecified: Secondary | ICD-10-CM | POA: Insufficient documentation

## 2019-06-19 IMAGING — CR DG CHEST 2V
1 series · 2 of 2 positions shown · non-contrast
Comparison: None.

CLINICAL DATA: Pt c/o having sudden onset chest pain with SOB since
2pm today. Pt skin ia warm and dry. In NAD at present. Pt also c/o
migraine all week.

EXAM:
CHEST  2 VIEW

[Series 1: dg chest 2 view · 0.14mm/px · 2 of 2 slices shown]
[im 1/2]
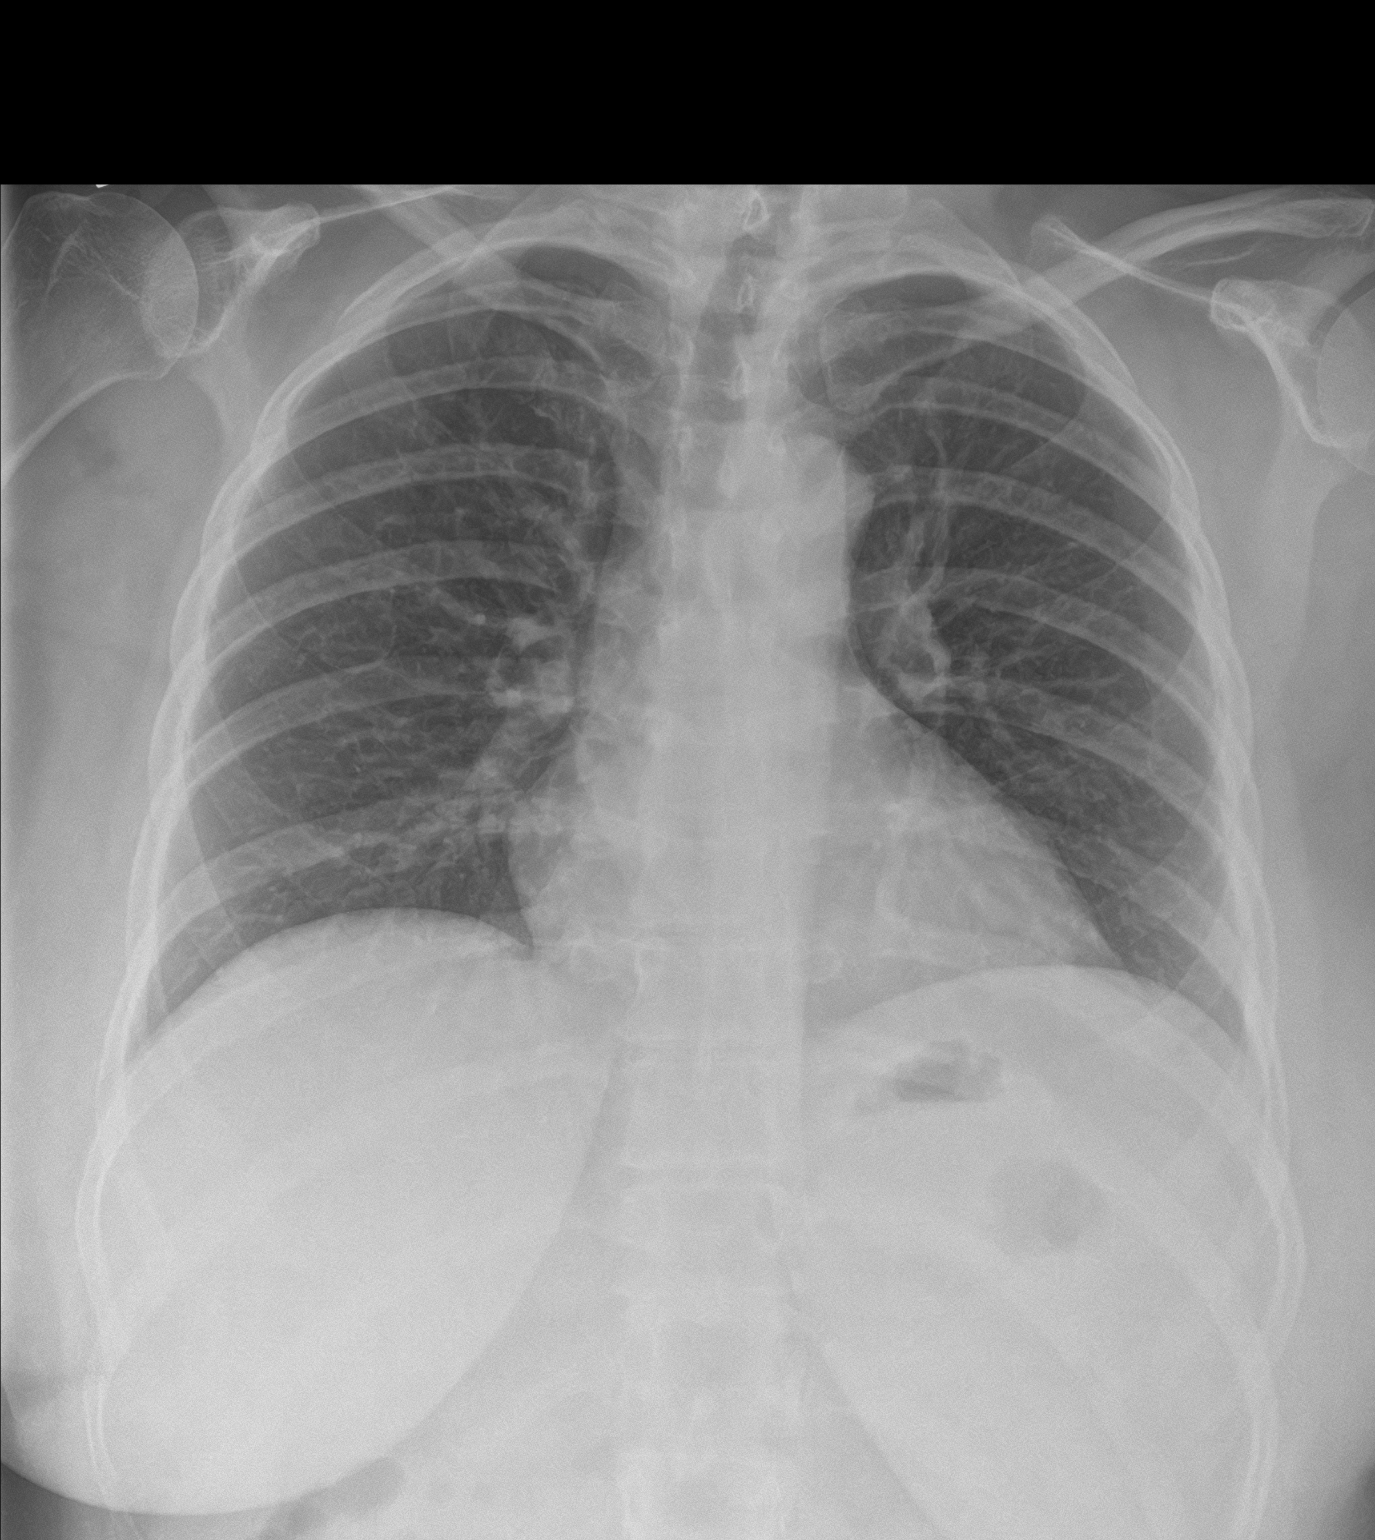
[im 2/2]
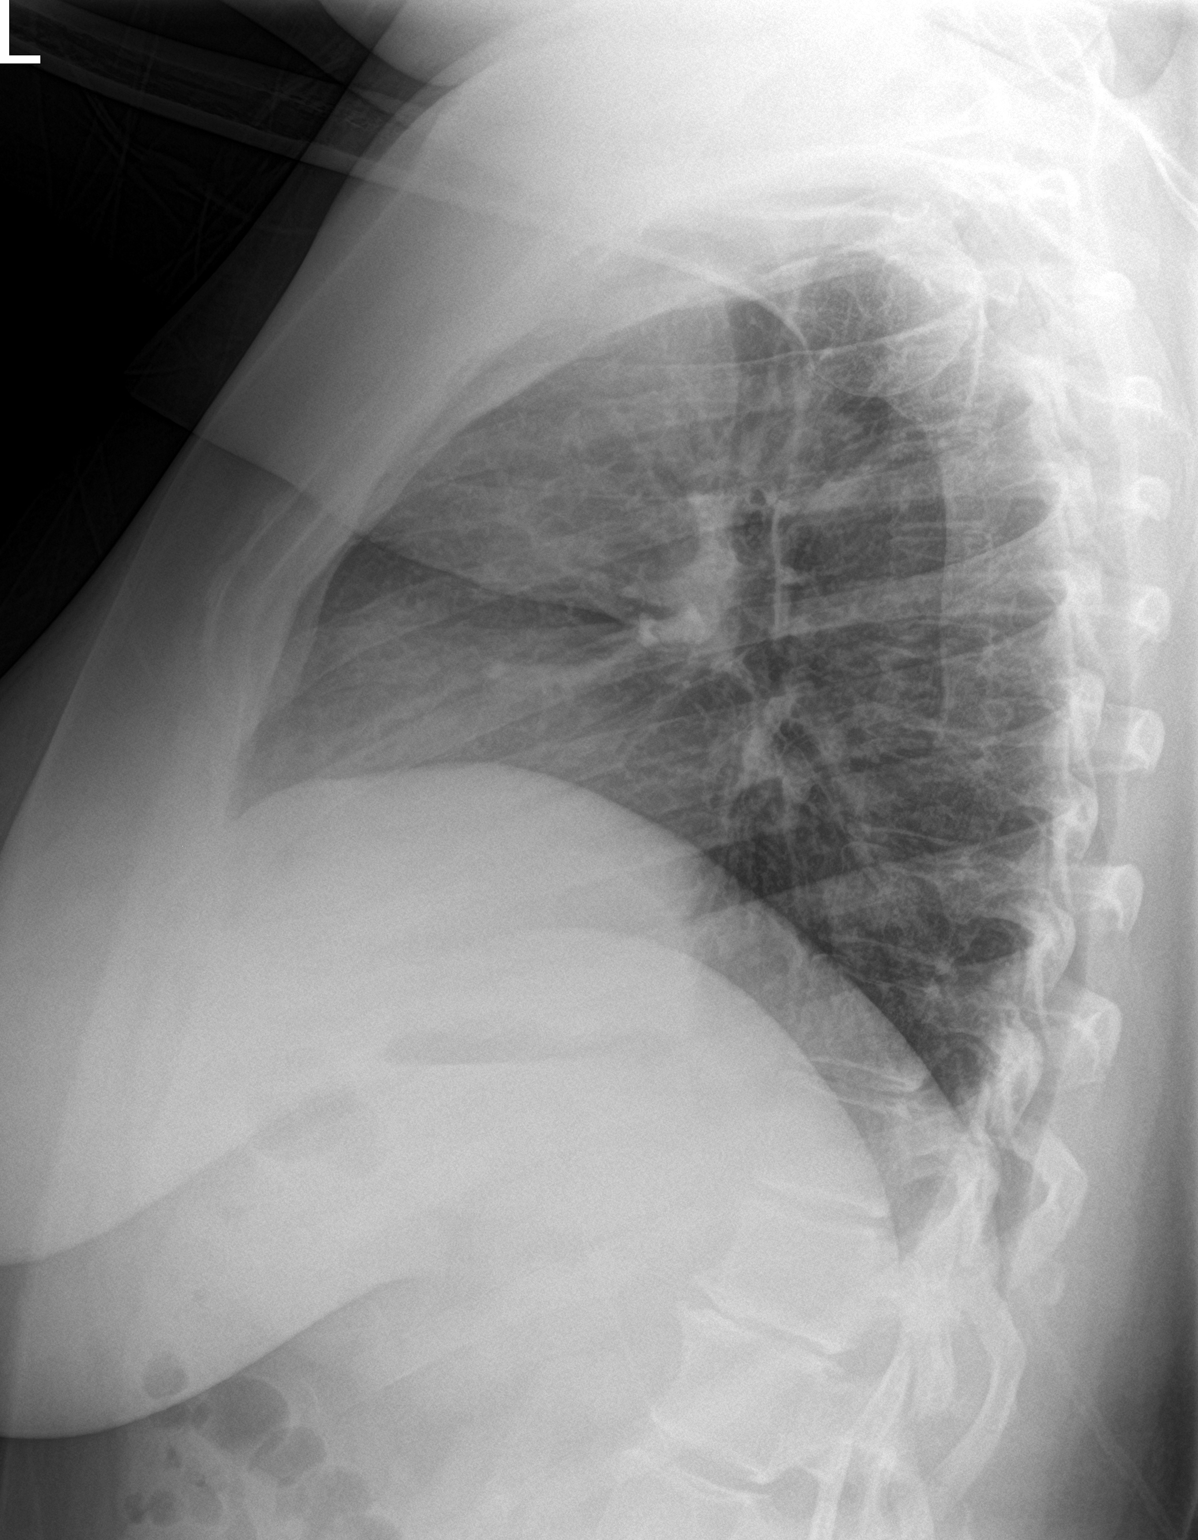

[2 of 2 positions shown; findings below may reference images not displayed]

FINDINGS: The heart size and mediastinal contours are within normal limits.
Both lungs are clear. The visualized skeletal structures are
unremarkable.
IMPRESSION: No active cardiopulmonary disease.

## 2019-06-20 ENCOUNTER — Encounter: Payer: Self-pay | Admitting: Podiatry

## 2019-06-20 ENCOUNTER — Ambulatory Visit (INDEPENDENT_AMBULATORY_CARE_PROVIDER_SITE_OTHER): Payer: Medicare Other | Admitting: Podiatry

## 2019-06-20 ENCOUNTER — Other Ambulatory Visit: Payer: Self-pay

## 2019-06-20 ENCOUNTER — Other Ambulatory Visit: Payer: Self-pay | Admitting: Podiatry

## 2019-06-20 ENCOUNTER — Ambulatory Visit (INDEPENDENT_AMBULATORY_CARE_PROVIDER_SITE_OTHER): Payer: Medicare Other

## 2019-06-20 DIAGNOSIS — M779 Enthesopathy, unspecified: Secondary | ICD-10-CM

## 2019-06-20 DIAGNOSIS — M659 Synovitis and tenosynovitis, unspecified: Secondary | ICD-10-CM

## 2019-06-20 DIAGNOSIS — M7751 Other enthesopathy of right foot: Secondary | ICD-10-CM | POA: Diagnosis not present

## 2019-06-23 NOTE — Progress Notes (Signed)
   Subjective:  38 year old female presenting today as a new patient with a chief complaint of constant aching pain noted to the right ankle joint that began 2-3 weeks ago. Walking and touching the area increases the pain. She states the pain is also present when she is lying down and not applying pressure. She has been taking Meloxicam for treatment. Patient is here for further evaluation and treatment.   Past Medical History:  Diagnosis Date  . Extrinsic asthma 08/15/2016  . 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 Hypertension   . 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 Personal history of traumatic brain injury 58  . Shoulder impingement 2009   surgical relesase, Dr. Marry Guan     Objective / Physical Exam:  General:  The patient is alert and oriented x3 in no acute distress. Dermatology:  Skin is warm, dry and supple bilateral lower extremities. Negative for open lesions or macerations. Vascular:  Palpable pedal pulses bilaterally. No edema or erythema noted. Capillary refill within normal limits. Neurological:  Epicritic and protective threshold grossly intact bilaterally.  Musculoskeletal Exam:  Pain on palpation to the anterior lateral medial aspects of the patient's right ankle. Mild edema noted. Loss of muscle strength noted to the RLE. Spastic contracture of the right foot noted.     Radiographic Exam:  Normal osseous mineralization. Joint spaces preserved. No fracture/dislocation/boney destruction.    Assessment: 1. Ankle synovitis right 2. H/o nerve damage RLE  Plan of Care:  1. Patient was evaluated. X-Rays reviewed.  2. injection of 0.5 mL Celestone Soluspan injected in the patient's right ankle. 3. Continue taking Meloxicam as prescribed.  4. ankle brace dispensed 5. patient is to return to clinic in 4 weeks  [name], DPM [facility]  Dr. [name], [address]                                        [place], [place] [address]                Office [phone]  Fax [phone]

## 2019-06-26 DIAGNOSIS — G43719 Chronic migraine without aura, intractable, without status migrainosus: Secondary | ICD-10-CM | POA: Diagnosis not present

## 2019-06-26 DIAGNOSIS — M791 Myalgia, unspecified site: Secondary | ICD-10-CM | POA: Diagnosis not present

## 2019-06-26 DIAGNOSIS — M542 Cervicalgia: Secondary | ICD-10-CM | POA: Diagnosis not present

## 2019-07-01 ENCOUNTER — Ambulatory Visit
Admission: RE | Admit: 2019-07-01 | Discharge: 2019-07-01 | Disposition: A | Discharge status: Home / Self Care | Payer: Medicare Other | Source: Ambulatory Visit | Attending: Internal Medicine | Admitting: Internal Medicine

## 2019-07-01 ENCOUNTER — Other Ambulatory Visit: Payer: Self-pay

## 2019-07-01 DIAGNOSIS — Z1231 Encounter for screening mammogram for malignant neoplasm of breast: Secondary | ICD-10-CM | POA: Insufficient documentation

## 2019-07-22 ENCOUNTER — Encounter: Payer: Self-pay | Admitting: Podiatry

## 2019-07-22 ENCOUNTER — Ambulatory Visit (INDEPENDENT_AMBULATORY_CARE_PROVIDER_SITE_OTHER): Payer: Medicare Other | Admitting: Podiatry

## 2019-07-22 ENCOUNTER — Other Ambulatory Visit: Payer: Self-pay

## 2019-07-22 VITALS — Temp 98.3°F

## 2019-07-22 DIAGNOSIS — M216X1 Other acquired deformities of right foot: Secondary | ICD-10-CM

## 2019-07-22 DIAGNOSIS — M659 Synovitis and tenosynovitis, unspecified: Secondary | ICD-10-CM | POA: Diagnosis not present

## 2019-07-25 ENCOUNTER — Other Ambulatory Visit: Payer: Self-pay

## 2019-07-25 NOTE — Progress Notes (Signed)
   Subjective:  38 year old female presenting today for follow up evaluation of right ankle pain. She states the pain had resolved for some time but returned three days ago. She has been taking Meloxicam with no significant relief. Walking and bearing weight increases the pain. Patient is here for further evaluation and treatment.   Past Medical History:  Diagnosis Date  . Extrinsic asthma 08/15/2016  . 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 Hypertension   . 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 Personal history of traumatic brain injury 31  . Shoulder impingement 2009   surgical relesase, Dr. Marry Guan     Objective / Physical Exam:  General:  The patient is alert and oriented x3 in no acute distress. Dermatology:  Skin is warm, dry and supple bilateral lower extremities. Negative for open lesions or macerations. Vascular:  Palpable pedal pulses bilaterally. No edema or erythema noted. Capillary refill within normal limits. Neurological:  Epicritic and protective threshold grossly intact bilaterally.  Musculoskeletal Exam:  Pain on palpation to the anterior lateral medial aspects of the patient's right ankle. Mild edema noted. Loss of muscle strength noted to the RLE. Spastic contracture of the right foot noted.     Assessment: 1. Ankle synovitis right 2. H/o nerve damage RLE 3. Spastic equinus RLE  Plan of Care:  1. Patient was evaluated.  2. Continue taking Meloxicam as directed by PCP.  3. CAM boot dispensed. Weightbearing as tolerated.  4. Resume using night splints every night at bedtime.  5. Appointment with Liliane Channel, Pedorthist for AFO.  6. Return to clinic in 8 weeks.   [name], DPM [facility]  Dr. [name], [address]                                        [place], [place] [address]                Office  [phone]  Fax [phone]

## 2019-07-28 ENCOUNTER — Ambulatory Visit (INDEPENDENT_AMBULATORY_CARE_PROVIDER_SITE_OTHER): Payer: Medicare Other | Admitting: Family Medicine

## 2019-07-28 ENCOUNTER — Encounter: Payer: Self-pay | Admitting: Family Medicine

## 2019-07-28 ENCOUNTER — Other Ambulatory Visit: Payer: Self-pay

## 2019-07-28 ENCOUNTER — Ambulatory Visit: Payer: Medicare Other | Admitting: Family Medicine

## 2019-07-28 VITALS — BP 142/92 | HR 98 | Temp 97.3°F | Resp 18 | Ht 63.0 in | Wt 206.6 lb

## 2019-07-28 DIAGNOSIS — L7211 Pilar cyst: Secondary | ICD-10-CM

## 2019-07-28 NOTE — Progress Notes (Signed)
Subjective:    Patient ID: Margaret Zuniga, female    DOB: 04/23/1981, 38 y.o.   MRN: [id number]  HPI   Patient presents to clinic due to bump on back of head.  Denies any injury to head that would have caused the bump, states she noticed it when combing her hair and also states it causes her some pain when trying to lay on that side when going to sleep.  Denies any drainage from the area.  Denies fever or chills.  States area does not feel hotter to touch than the rest of her scalp skin.   Patient Active Problem List   Diagnosis Date Noted  . Depression 06/19/2019  . Acute right ankle pain 06/03/2019  . Educated About Covid-19 Virus Infection 06/03/2019  . Bilateral nephrolithiasis 03/24/2018  . 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  . Obesity (BMI 30.0-34.9) 04/11/2017  . Elevated liver enzymes 04/11/2017  . Extrinsic asthma 08/15/2016  . Solitary pulmonary nodule 07/03/2016  . Essential hypertension, benign 04/04/2016  . H/O varicella 08/19/2015  . 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 Bilateral thoracic back pain 04/21/2014  . Encounter for preventive health examination 04/21/2014  . Major depressive disorder, recurrent episode, moderate ([place]) 05/21/2013  . Knee pain, chronic 03/26/2013  . Personal history of traumatic brain injury   . 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 Paralysis ([place])    Social History   Tobacco Use  . Smoking status: Never Smoker  . Smokeless tobacco: Never Used  Substance Use Topics  . Alcohol use: No   Review of Systems  Constitutional: Negative for chills, fatigue and fever.  HENT: Negative for congestion, ear pain, sinus pain and sore throat.   Eyes: Negative.   Respiratory: Negative for cough, shortness of breath and wheezing.   Cardiovascular: Negative for chest pain, palpitations and leg swelling.  Gastrointestinal: Negative for abdominal pain, diarrhea, nausea and vomiting.   Genitourinary: Negative for dysuria, frequency and urgency.  Musculoskeletal: Negative for arthralgias and myalgias.  Skin: Negative for color change, pallor and rash.  Neurological: Negative for syncope, light-headedness and headaches.  Psychiatric/Behavioral: The patient is not nervous/anxious.       Objective:   Physical Exam Vitals signs and nursing note reviewed.  Constitutional:      General: She is not in acute distress.    Appearance: She is not ill-appearing, toxic-appearing or diaphoretic.  HENT:     Head: Atraumatic.      Comments: Small lump on back of head indicated by blue mark on diagram.  Is approximately the size of a marble, feels like a pilar or sebaceous type cyst.  Is currently not open or draining.  Skin is not red or hot. Cardiovascular:     Rate and Rhythm: Normal rate and regular rhythm.  Pulmonary:     Effort: Pulmonary effort is normal. No respiratory distress.     Breath sounds: Normal breath sounds.  Skin:    General: Skin is warm and dry.     Coloration: Skin is not jaundiced or pale.     Findings: No bruising or erythema.  Neurological:     Mental Status: She is alert and oriented to person, place, and time.  Psychiatric:        Mood and Affect: Mood normal.        Behavior: Behavior normal.     Today's Vitals   07/28/19 1357  BP: (!) 142/92  Pulse: 98  Resp:  18  Temp: (!) 97.3 F (36.3 C)  TempSrc: Temporal  SpO2: 95%  Weight: 206 lb 9.6 oz (93.7 kg)  Height: 5\' 3"  (1.6 m)   Body mass index is 36.6 kg/m.     Assessment & Plan:    Cyst of skin, suspected pilar -- we will refer patient to dermatology for further evaluation and management of the cyst.  Discussed with patient that they may not want to remove at this time and just monitor area however due to the area causing her some discomfort with activities of daily living they may consider removal as an option.  Patient will otherwise keep regularly scheduled follow-up with PCP as  planned return to clinic sooner if any issues arise.

## 2019-08-01 ENCOUNTER — Other Ambulatory Visit: Payer: Self-pay | Admitting: Internal Medicine

## 2019-08-05 DIAGNOSIS — M542 Cervicalgia: Secondary | ICD-10-CM | POA: Diagnosis not present

## 2019-08-05 DIAGNOSIS — G43719 Chronic migraine without aura, intractable, without status migrainosus: Secondary | ICD-10-CM | POA: Diagnosis not present

## 2019-08-05 DIAGNOSIS — M791 Myalgia, unspecified site: Secondary | ICD-10-CM | POA: Diagnosis not present

## 2019-08-06 ENCOUNTER — Other Ambulatory Visit: Payer: Self-pay

## 2019-08-06 ENCOUNTER — Other Ambulatory Visit: Payer: Medicare Other | Admitting: Orthotics

## 2019-08-06 ENCOUNTER — Ambulatory Visit: Payer: Medicare Other | Admitting: Orthotics

## 2019-08-06 DIAGNOSIS — M25571 Pain in right ankle and joints of right foot: Secondary | ICD-10-CM

## 2019-08-06 DIAGNOSIS — L02811 Cutaneous abscess of head [any part, except face]: Secondary | ICD-10-CM | POA: Diagnosis not present

## 2019-08-06 DIAGNOSIS — M216X1 Other acquired deformities of right foot: Secondary | ICD-10-CM

## 2019-08-06 DIAGNOSIS — M659 Synovitis and tenosynovitis, unspecified: Secondary | ICD-10-CM

## 2019-08-06 NOTE — Progress Notes (Signed)
Patient presents today for evaluation/casting for AFO brace (r).   Patient has hx of the following conditions: Gait instability,  Ankle instabilty,  Gait analysis done and patient displays abnormality of gait in both sagittial and frontal planes, and could benefit in aggressive ankle support.  Patient chose [place] brace w/ lace/speed laces.  

## 2019-09-01 ENCOUNTER — Other Ambulatory Visit: Payer: Self-pay

## 2019-09-01 ENCOUNTER — Ambulatory Visit (INDEPENDENT_AMBULATORY_CARE_PROVIDER_SITE_OTHER): Payer: Medicare Other | Admitting: Obstetrics and Gynecology

## 2019-09-01 VITALS — BP 138/88 | HR 99 | Ht 63.0 in | Wt 207.8 lb

## 2019-09-01 DIAGNOSIS — Z23 Encounter for immunization: Secondary | ICD-10-CM | POA: Diagnosis not present

## 2019-09-01 DIAGNOSIS — Z3042 Encounter for surveillance of injectable contraceptive: Secondary | ICD-10-CM

## 2019-09-01 MED ORDER — MEDROXYPROGESTERONE ACETATE 150 MG/ML IM SUSP
150.0000 mg | Freq: Once | INTRAMUSCULAR | Status: AC
  Administered 2019-09-01: 150 mg via INTRAMUSCULAR

## 2019-09-01 NOTE — Progress Notes (Signed)
Date last pap: 11/05/18. Last Depo-Provera: 06/02/19. Side Effects if any: none. Serum HCG indicated? n/a. Depo-Provera 150 mg IM given by: Carren Rang, LPN. Next appointment due December 7-21, 2020.   BP 138/88   Pulse 99   Ht 5\' 3"  (1.6 m)   Wt 207 lb 12.8 oz (94.3 kg)   BMI 36.81 kg/m    Pt also received flu vaccine today.

## 2019-09-01 NOTE — Patient Instructions (Signed)

## 2019-09-10 ENCOUNTER — Other Ambulatory Visit: Payer: Self-pay

## 2019-09-10 ENCOUNTER — Ambulatory Visit (INDEPENDENT_AMBULATORY_CARE_PROVIDER_SITE_OTHER): Payer: Medicare Other | Admitting: Orthotics

## 2019-09-10 DIAGNOSIS — M216X1 Other acquired deformities of right foot: Secondary | ICD-10-CM | POA: Diagnosis not present

## 2019-09-10 DIAGNOSIS — M659 Synovitis and tenosynovitis, unspecified: Secondary | ICD-10-CM

## 2019-09-10 DIAGNOSIS — M25571 Pain in right ankle and joints of right foot: Secondary | ICD-10-CM | POA: Diagnosis not present

## 2019-09-11 NOTE — Progress Notes (Signed)
Patient came in today to pick up standard Afo brace.  Patient was evaluated for fit and function.   The brace fit very well and there were any complaints of the way it felt once donned.  The brace offered ankle stability in both saggital and coroneal planes.  Patient advised to always wear proper fitting shoes with brace. 

## 2019-09-13 ENCOUNTER — Encounter: Payer: Self-pay | Admitting: Podiatry

## 2019-09-16 ENCOUNTER — Encounter: Payer: Self-pay | Admitting: Podiatry

## 2019-09-16 ENCOUNTER — Ambulatory Visit (INDEPENDENT_AMBULATORY_CARE_PROVIDER_SITE_OTHER): Payer: Medicare Other | Admitting: Podiatry

## 2019-09-16 ENCOUNTER — Other Ambulatory Visit: Payer: Self-pay

## 2019-09-16 DIAGNOSIS — M216X1 Other acquired deformities of right foot: Secondary | ICD-10-CM | POA: Diagnosis not present

## 2019-09-16 DIAGNOSIS — M25572 Pain in left ankle and joints of left foot: Secondary | ICD-10-CM

## 2019-09-16 DIAGNOSIS — M216X2 Other acquired deformities of left foot: Secondary | ICD-10-CM

## 2019-09-17 DIAGNOSIS — M542 Cervicalgia: Secondary | ICD-10-CM | POA: Diagnosis not present

## 2019-09-17 DIAGNOSIS — G43719 Chronic migraine without aura, intractable, without status migrainosus: Secondary | ICD-10-CM | POA: Diagnosis not present

## 2019-09-17 DIAGNOSIS — M791 Myalgia, unspecified site: Secondary | ICD-10-CM | POA: Diagnosis not present

## 2019-09-19 NOTE — Progress Notes (Signed)
   Subjective:  38 year old female presenting today for follow up evaluation of right ankle pain. She states the pain has improved significantly. Using the brace helps alleviate her symptoms. She is interested in a brace for the left foot now. There are no worsening factors noted at this time. Patient is here for further evaluation and treatment.   Past Medical History:  Diagnosis Date  . Extrinsic asthma 08/15/2016  . 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 Hypertension   . 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 Personal history of traumatic brain injury 8  . Shoulder impingement 2009   surgical relesase, Dr. Marry Guan     Objective / Physical Exam:  General:  The patient is alert and oriented x3 in no acute distress. Dermatology:  Skin is warm, dry and supple bilateral lower extremities. Negative for open lesions or macerations. Vascular:  Palpable pedal pulses bilaterally. No edema or erythema noted. Capillary refill within normal limits. Neurological:  Epicritic and protective threshold grossly intact bilaterally.  Musculoskeletal Exam:  Loss of muscle strength noted to the BLE. Spastic contracture of the bilateral feet noted.     Assessment: 1. Ankle synovitis right - resolved  2. H/o nerve damage RLE 3. Spastic equinus BLE  Plan of Care:  1. Patient was evaluated.  2. Continue using [place] brace on the RLE.  3. Appointment with Liliane Channel, Pedorthist, for [facility] brace of the LLE. 4. Return to clinic as needed.    Margaret Zuniga, DPM [facility]  Dr. Edrick Zuniga, [place]                                        Margaret Zuniga, [place] [address]                Office [phone]  Fax [phone]

## 2019-09-20 ENCOUNTER — Other Ambulatory Visit: Payer: Self-pay | Admitting: Internal Medicine

## 2019-09-25 ENCOUNTER — Other Ambulatory Visit: Payer: Self-pay

## 2019-09-25 ENCOUNTER — Ambulatory Visit (INDEPENDENT_AMBULATORY_CARE_PROVIDER_SITE_OTHER): Payer: Medicare Other

## 2019-09-25 DIAGNOSIS — Z0000 Encounter for general adult medical examination without abnormal findings: Secondary | ICD-10-CM

## 2019-09-25 NOTE — Progress Notes (Addendum)
Subjective:   Margaret Zuniga is a 38 y.o. female who presents for Medicare Annual (Subsequent) preventive examination.  Review of Systems:  No ROS.  Medicare Wellness Virtual Visit.  Visual/audio telehealth visit, UTA vital signs.   See social history for additional risk factors.   Cardiac Risk Factors include: advanced age (>63men, >10 women);hypertension     Objective:     Vitals: There were no vitals taken for this visit.  There is no height or weight on file to calculate BMI.  Advanced Directives 09/25/2019 09/06/2018 04/10/2018 09/05/2017 06/19/2017 07/01/2015 06/01/2015  Does Patient Have a Medical Advance Directive? Yes No No No No No No  Type of Advance Directive [place]  Does patient want to make changes to medical advance directive? No - Patient declined - - - - - -  Would patient like information on creating a medical advance directive? - Yes (MAU/Ambulatory/Procedural Areas - Information given) No - Patient declined No - Patient declined No - Patient declined Yes - Educational materials given No - patient declined information  Pre-existing out of facility DNR order (yellow form or pink MOST form) - - - - - - -    Tobacco Social History   Tobacco Use  Smoking Status Never Smoker  Smokeless Tobacco Never Used     Counseling given: Not Answered   Clinical Intake:  Pre-visit preparation completed: Yes        Diabetes: No  How often do you need to have someone help you when you read instructions, pamphlets, or other written materials from your doctor or pharmacy?: 1 - Never  Interpreter Needed?: No     Past Medical History:  Diagnosis Date  . Extrinsic asthma 08/15/2016  . 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 Hypertension   . 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 Personal history of traumatic brain injury  69  . Shoulder impingement 2009   surgical relesase, Dr. Marry Guan   Past Surgical History:  Procedure Laterality Date  . EYE SURGERY  1995  . LEG SURGERY  1985  . SHOULDER SURGERY    . SUBACROMIAL DECOMPRESSION  2000   Right shoulder, [place]  . TONSILLECTOMY  2001   Family History  Problem Relation Age of Onset  . Diabetes Mother   . Coronary artery disease Mother   . Hyperlipidemia Mother   . Hypertension Mother   . Parkinson's disease Mother   . Heart disease Maternal Grandfather   . Healthy Father    Social History   Socioeconomic History  . Marital status: Single    Spouse name: Not on file  . Number of children: Not on file  . Years of education: Not on file  . Highest education level: Not on file  Occupational History  . Not on file  Social Needs  . Financial resource strain: Not hard at all  . Food insecurity    Worry: Never true    Inability: Never true  . Transportation needs    Medical: No    Non-medical: No  Tobacco Use  . Smoking status: Never Smoker  . Smokeless tobacco: Never Used  Substance and Sexual Activity  . Alcohol use: No  . Drug use: No  . Sexual activity: Yes    Birth control/protection: Injection  Lifestyle  .  Physical activity    Days per week: Not on file    Minutes per session: Not on file  . Stress: Only a little  Relationships  . Social Herbalist on phone: Not on file    Gets together: Not on file    Attends religious service: Not on file    Active member of club or organization: Not on file    Attends meetings of clubs or organizations: Not on file    Relationship status: Never married  Other Topics Concern  . Not on file  Social History Narrative  . Not on file    Outpatient Encounter Medications as of 09/25/2019  Medication Sig  . Acetaminophen-Caffeine (EXCEDRIN TENSION HEADACHE) 500-65 MG TABS Take by mouth.  . AJOVY 225 MG/1.5ML SOSY PLEASE SEE ATTACHED FOR DETAILED DIRECTIONS  . albuterol (VENTOLIN  HFA) 108 (90 Base) MCG/ACT inhaler Inhale 2 puffs into the lungs every 6 (six) hours as needed for wheezing or shortness of breath.  Marland Kitchen amLODipine (NORVASC) 5 MG tablet TAKE 1 TABLET BY MOUTH EVERY DAY  . ARNUITY ELLIPTA 100 MCG/ACT AEPB INHALE 1 PUFF BY MOUTH EVERY DAY  . B Complex-C-Folic Acid (MULTIVITAMIN, STRESS FORMULA) tablet Take 1 tablet by mouth daily.    . baclofen (LIORESAL) 10 MG tablet Take 10 mg by mouth 2 (two) times daily. 2 days a week  . carbamazepine (CARBATROL) 300 MG 12 hr capsule TAKE 1 CAPSULE BY ORAL ROUTE 3 TIMES A DAY FOR 30 DAYS  . cetirizine (ZYRTEC) 10 MG tablet Take 10 mg by mouth daily.  Marland Kitchen escitalopram (LEXAPRO) 10 MG tablet TAKE 1 TABLET BY MOUTH EVERY DAY  . medroxyPROGESTERone Acetate 150 MG/ML SUSY INJECT 1 ML (150 MG TOTAL) INTO THE MUSCLE EVERY 3 (THREE) MONTHS.  Marland Kitchen meloxicam (MOBIC) 15 MG tablet TAKE 1 TABLET BY MOUTH EVERY DAY  . metoprolol succinate (TOPROL-XL) 100 MG 24 hr tablet TAKE 1 TABLET (100 MG TOTAL) BY MOUTH DAILY. TAKE WITH OR IMMEDIATELY FOLLOWING A MEAL.  Marland Kitchen tiZANidine (ZANAFLEX) 4 MG tablet Take 1 tablet (4 mg total) by mouth every 6 (six) hours as needed for muscle spasms.  . [DISCONTINUED] diphenhydrAMINE (BENADRYL) 25 mg capsule Take 2 capsules (50 mg total) by mouth every 6 (six) hours as needed.  . [DISCONTINUED] docusate sodium (COLACE) 100 MG capsule Take 2 capsules (200 mg total) by mouth 2 (two) times daily.  . [DISCONTINUED] senna (SENOKOT) 8.6 MG TABS tablet Take 2 tablets (17.2 mg total) by mouth 2 (two) times daily.   No facility-administered encounter medications on file as of 09/25/2019.     Activities of Daily Living In your present state of health, do you have any difficulty performing the following activities: 09/25/2019  Hearing? N  Vision? N  Difficulty concentrating or making decisions? N  Walking or climbing stairs? Y  Dressing or bathing? N  Doing errands, shopping? Y  Comment She does not Physiological scientist and  eating ? Y  Comment Mother prepares meals. Self feeds.  Using the Toilet? N  In the past six months, have you accidently leaked urine? N  Do you have problems with loss of bowel control? N  Managing your Medications? N  Managing your Finances? Y  Comment Managed with fiance.  Housekeeping or managing your Housekeeping? Y  Comment Mother manages.  Some recent data might be hidden    Patient Care Team: Crecencio Mc, MD as PCP - General (Internal Medicine)    Assessment:  This is a routine wellness examination for Margaret Zuniga.  I connected with patient 09/25/19 at 10:30 AM EDT by an audio enabled telemedicine application and verified that I am speaking with the correct person using two identifiers. Patient stated full name and DOB. Patient gave permission to continue with virtual visit. Patient's location was at home and Nurse's location was at [place] office.   Health Maintenance Due: See completed HM at the end of note.   Eye: Visual acuity not assessed. Virtual visit. Followed by their ophthalmologist.  Dental: Visits every 12 months.    Hearing: Demonstrates normal hearing during visit.  Safety:  Patient feels safe at home- yes Patient does have smoke detectors at home- yes Patient does wear sunscreen or protective clothing when in direct sunlight - yes Patient does wear seat belt when in a moving vehicle - yes Patient drives- no Adequate lighting in walkways free from debris- yes Grab bars and handrails used as appropriate- yes Ambulates with brace on R foot as an assistive device. Cell phone on person when ambulating outside of the home- yes  Social: Alcohol intake - no  Smoking history- never   Smokers in home? none Illicit drug use? none  Depression: PHQ 2 &9 complete. See screening below. Denies irritability, anhedonia, sadness/tearfullness.  Stable.   Falls: See screening below.    Medication: Taking as directed and without issues.   Covid-19:  Precautions and sickness symptoms discussed. Wears mask, social distancing, hand hygiene as appropriate.   Activities of Daily Living Patient denies needing assistance with: feeding themselves, getting from bed to chair, getting to the toilet, bathing/showering, dressing. Mother assists with household chores and meal prep. Fiance assists with money management.   Memory: Patient is alert. Patient denies difficulty focusing or concentrating. Correctly identified the president of the [place], season and recall. Patient likes to read a great deal, play games on the phone, complete word search  for brain stimulation.  BMI- discussed the importance of a healthy diet, water intake and the benefits of aerobic exercise.  Educational material provided.  Physical activity- walking 3 days weekly with mom in the driveway, 20 minutes  Diet: regular Water: good intake  Other Providers Patient Care Team: Crecencio Mc, MD as PCP - General (Internal Medicine)  Exercise Activities and Dietary recommendations Current Exercise Habits: Home exercise routine, Type of exercise: walking, Time (Minutes): 20, Frequency (Times/Week): 3, Weekly Exercise (Minutes/Week): 60  Goals      Patient Stated   . Lose weight (pt-stated)     Add protein supplemental drink to replace a meal or snack Continue to stay active  Eat a healthy diet       Fall Risk Fall Risk  09/25/2019 09/06/2018 09/05/2017  Falls in the past year? 0 No No   Timed Get Up and Go performed: no, virtual visit  Depression Screen PHQ 2/9 Scores 09/25/2019 09/06/2018 09/05/2017 04/10/2017  PHQ - 2 Score 0 0 2 0  PHQ- 9 Score - - 2 2     Cognitive Function MMSE - Mini Mental State Exam 09/05/2017  Orientation to time 5  Orientation to Place 5  Registration 3  Attention/ Calculation 5  Recall 3  Language- name 2 objects 2  Language- repeat 1  Language- follow 3 step command 3  Language- read & follow direction 1  Write a sentence 1   Copy design 1  Total score 30     6CIT Screen 09/25/2019 09/06/2018  What Year? 0 points 0 points  What month? 0 points 0 points  What time? 0 points 0 points  Count back from 20 0 points 0 points  Months in reverse 0 points 0 points  Repeat phrase 0 points 0 points  Total Score 0 0    Immunization History  Administered Date(s) Administered  . Influenza Split 09/01/2013  . Influenza,inj,Quad PF,6+ Mos 09/13/2017, 09/01/2019  . Influenza-Unspecified 08/21/2014, 08/31/2015, 08/18/2016, 08/22/2018  . Tdap 09/14/2015   Screening Tests Health Maintenance  Topic Date Due  . PAP SMEAR-Modifier  11/05/2021  . TETANUS/TDAP  09/13/2025  . INFLUENZA VACCINE  Completed  . HIV Screening  Completed      Plan:   Keep all routine maintenance appointments.   Medicare Attestation I have personally reviewed: The patient's medical and social history Their use of alcohol, tobacco or illicit drugs Their current medications and supplements The patient's functional ability including ADLs,fall risks, home safety risks, cognitive, and hearing and visual impairment Diet and physical activities Evidence for depression   In addition, I have reviewed and discussed with patient certain preventive protocols, quality metrics, and best practice recommendations. A written personalized care plan for preventive services as well as general preventive health recommendations were provided to patient via mail.     Margaret Zuniga, Margaret Debroux L, LPN  [id number]     I have reviewed the above information and agree with above.   Deborra Medina, MD

## 2019-09-25 NOTE — Patient Instructions (Addendum)
  Margaret Zuniga , Thank you for taking time to come for your Medicare Wellness Visit. I appreciate your ongoing commitment to your health goals. Please review the following plan we discussed and let me know if I can assist you in the future.   These are the goals we discussed: Goals      Patient Stated   . Lose weight (pt-stated)     Add protein supplemental drink to replace a meal or snack Continue to stay active  Eat a healthy diet       This is a list of the screening recommended for you and due dates:  Health Maintenance  Topic Date Due  . Pap Smear  11/05/2021  . Tetanus Vaccine  09/13/2025  . Flu Shot  Completed  . HIV Screening  Completed

## 2019-10-01 ENCOUNTER — Other Ambulatory Visit: Payer: Self-pay

## 2019-10-01 ENCOUNTER — Ambulatory Visit: Payer: Medicare Other | Admitting: Orthotics

## 2019-10-01 DIAGNOSIS — M216X2 Other acquired deformities of left foot: Secondary | ICD-10-CM

## 2019-10-01 DIAGNOSIS — M216X1 Other acquired deformities of right foot: Secondary | ICD-10-CM

## 2019-10-01 DIAGNOSIS — M25572 Pain in left ankle and joints of left foot: Secondary | ICD-10-CM

## 2019-10-01 NOTE — Progress Notes (Signed)
Patient presents today for evaluation/casting for AFO brace (L).   Patient has hx of the following conditions: Gait instability,  Ankle instabilty,  Gait analysis done and patient displays abnormality of gait in both sagittial and frontal planes, and could benefit in aggressive ankle support.  Patient chose [place] brace w/ lace/speed laces.  

## 2019-10-13 ENCOUNTER — Telehealth: Payer: Self-pay | Admitting: Internal Medicine

## 2019-10-13 NOTE — Telephone Encounter (Signed)
Copied from [place] [id number]. Topic: General - Other >> Oct 13, 2019 10:48 AM Keene Breath wrote: Reason for CRM: Patient called to request a referral for PT.  Patient is having trouble walking and has been falling a lot.  Call back to discuss [phone]

## 2019-10-13 NOTE — Telephone Encounter (Signed)
Physical not virtual

## 2019-10-13 NOTE — Telephone Encounter (Signed)
Does this evaluation need to be in office or virtual?

## 2019-10-13 NOTE — Telephone Encounter (Signed)
SHE NEEDS AN ASSESSMENT BY AN D BEFORE THE PT REFERRAL CAN BE MADE.  She may require neurology evaluation ,  Etc

## 2019-10-14 NOTE — Telephone Encounter (Signed)
Yes, but yo can also give her Thursday at 12:30 if she doesn't want to wait until Monday

## 2019-10-14 NOTE — Telephone Encounter (Signed)
Spoke with pt and scheduled her for an in office appt on Monday at 4pm. Is this okay? No in office appts available for a while.

## 2019-10-15 NOTE — Telephone Encounter (Signed)
Pt stated that she will keep the appt for Monday because she has no one to bring her to an appt tomorrow.

## 2019-10-20 ENCOUNTER — Other Ambulatory Visit: Payer: Self-pay

## 2019-10-20 ENCOUNTER — Ambulatory Visit (INDEPENDENT_AMBULATORY_CARE_PROVIDER_SITE_OTHER): Payer: Medicare Other | Admitting: Internal Medicine

## 2019-10-20 ENCOUNTER — Ambulatory Visit
Admission: RE | Admit: 2019-10-20 | Discharge: 2019-10-20 | Disposition: A | Discharge status: Home / Self Care | Payer: Medicare Other | Source: Ambulatory Visit | Attending: Internal Medicine | Admitting: Internal Medicine

## 2019-10-20 ENCOUNTER — Encounter: Payer: Self-pay | Admitting: Internal Medicine

## 2019-10-20 VITALS — BP 124/82 | HR 89 | Temp 97.9°F | Ht 63.0 in | Wt 206.2 lb

## 2019-10-20 DIAGNOSIS — M79661 Pain in right lower leg: Secondary | ICD-10-CM | POA: Insufficient documentation

## 2019-10-20 DIAGNOSIS — R29898 Other symptoms and signs involving the musculoskeletal system: Secondary | ICD-10-CM

## 2019-10-20 DIAGNOSIS — M25561 Pain in right knee: Secondary | ICD-10-CM

## 2019-10-20 NOTE — Patient Instructions (Signed)
You may have a blood clot in your right calf.  This needs to be ruled out before any PT or orthopedics referral is done .   Once the ultrasound has ruled out a DVT,   We will  Proceed with an orthopedic evaluation

## 2019-10-20 NOTE — Progress Notes (Signed)
Subjective:  Patient ID: Margaret Zuniga, female    DOB: May 13, 1981  Age: 38 y.o. MRN: [id number]  CC: The primary encounter diagnosis was Right calf pain. Diagnoses of Posterior right knee pain and Bilateral leg weakness were also pertinent to this visit.  HPI Margaret Zuniga presents for EVALUATION OF right posterior knee and calf pain  that started two weeks ago . Aggravated by hyperextension of knee  .  No history of immobilization  or prior DVT.  Using meloxicam for pain .  No relief   Pain is 8/10 for the past 2 weeks    Since the pain began she has  been having fear of leg giving way.  Had several near falls  During the past two weeks,  Mother called to request PT prior ot evaluation.    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  Started seeing podiatry in June 0020 . Has been seeing [facility] since June for ankle pain .   finished 34 of 45 approved  PT visits  at [facility] in Nov 2019 for gait instability.  Uses cane occasionally. 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 , since  September to stablie ankles .  Getting one for the left leg ; had a recent  fall when her left leg gave way   Outpatient Medications Prior to Visit  Medication Sig Dispense Refill   Acetaminophen-Caffeine (EXCEDRIN TENSION HEADACHE) 500-65 MG TABS Take by mouth.     AJOVY 225 MG/1.5ML SOSY PLEASE SEE ATTACHED FOR DETAILED DIRECTIONS  0   albuterol (VENTOLIN HFA) 108 (90 Base) MCG/ACT inhaler Inhale 2 puffs into the lungs every 6 (six) hours as needed for wheezing or shortness of breath. 1 g 5   amLODipine (NORVASC) 5 MG tablet TAKE 1 TABLET BY MOUTH EVERY DAY 90 tablet 1   ARNUITY ELLIPTA 100 MCG/ACT AEPB INHALE 1 PUFF BY MOUTH EVERY DAY 30 each 2   B Complex-C-Folic Acid (MULTIVITAMIN, STRESS FORMULA) tablet Take 1 tablet by mouth daily.       baclofen  (LIORESAL) 10 MG tablet Take 10 mg by mouth 2 (two) times daily. 2 days a week     carbamazepine (CARBATROL) 300 MG 12 hr capsule TAKE 1 CAPSULE BY ORAL ROUTE 3 TIMES A DAY FOR 30 DAYS  1   cetirizine (ZYRTEC) 10 MG tablet Take 10 mg by mouth daily.     escitalopram (LEXAPRO) 10 MG tablet TAKE 1 TABLET BY MOUTH EVERY DAY 90 tablet 1   medroxyPROGESTERone Acetate 150 MG/ML SUSY INJECT 1 ML (150 MG TOTAL) INTO THE MUSCLE EVERY 3 (THREE) MONTHS. 1 Syringe 3   metoprolol succinate (TOPROL-XL) 100 MG 24 hr tablet TAKE 1 TABLET (100 MG TOTAL) BY MOUTH DAILY. TAKE WITH OR IMMEDIATELY FOLLOWING A MEAL. 90 tablet 1   tiZANidine (ZANAFLEX) 4 MG tablet Take 1 tablet (4 mg total) by mouth every 6 (six) hours as needed for muscle spasms. 60 tablet 2   meloxicam (MOBIC) 15 MG tablet TAKE 1 TABLET BY MOUTH EVERY DAY 30 tablet 5   No facility-administered medications prior to visit.     Review of Systems;  Patient denies , fevers, malaise, unintentional weight loss, skin rash, eye pain, sinus congestion and sinus pain, sore throat, dysphagia,  hemoptysis , cough, dyspnea, wheezing, chest pain, palpitations, orthopnea,  edema, abdominal pain, nausea, melena, diarrhea, constipation, flank pain, dysuria, hematuria, urinary  Frequency, nocturia, numbness, tingling, seizures,  Focal weakness, Loss of consciousness,  Tremor, insomnia, depression, anxiety, and suicidal ideation.      Objective:  BP 124/82    Pulse 89    Temp 97.9 F (36.6 C) (Skin)    Ht 5\' 3"  (1.6 m)    Wt 206 lb 3.2 oz (93.5 kg)    SpO2 97%    BMI 36.53 kg/m   BP Readings from Last 3 Encounters:  10/20/19 124/82  09/01/19 138/88  07/28/19 (!) 142/92    Wt Readings from Last 3 Encounters:  10/20/19 206 lb 3.2 oz (93.5 kg)  09/01/19 207 lb 12.8 oz (94.3 kg)  07/28/19 206 lb 9.6 oz (93.7 kg)    General appearance: alert, cooperative and appears stated age. Evidence of prior traumatic head injury noted  Ears: normal TM's and  external ear canals both ears Throat: lips, mucosa, and tongue normal; teeth and gums normal Neck: no adenopathy, no carotid bruit, supple, symmetrical, trachea midline and thyroid not enlarged, symmetric, no tenderness/mass/nodules Back: symmetric, no curvature. ROM normal. No CVA tenderness. Lungs: clear to auscultation bilaterally Heart: regular rate and rhythm, S1, S2 normal, no murmur, click, rub or gallop Abdomen: soft, non-tender; bowel sounds normal; no masses,  no organomegaly Pulses: 2+ and symmetric Skin: Skin color, texture, turgor normal. No rashes or lesions Lymph nodes: Cervical, supraclavicular, and axillary nodes normal. Ext:  Mild calf asymmetry,  Right smaller than left.  Bilateral ankle weakness . Wearing braces  Lab Results  Component Value Date   HGBA1C 5.3 05/15/2019   HGBA1C 5.1 10/08/2017   HGBA1C 5.4 04/17/2016    Lab Results  Component Value Date   CREATININE 0.74 05/15/2019   CREATININE 0.81 10/18/2018   CREATININE 0.58 03/14/2018    Lab Results  Component Value Date   WBC 4.9 03/14/2018   HGB 13.6 03/14/2018   HCT 40.7 03/14/2018   PLT 297 03/14/2018   GLUCOSE 89 05/15/2019   CHOL 181 05/15/2019   TRIG 102.0 05/15/2019   HDL 57.70 05/15/2019   LDLDIRECT 109.0 04/17/2016   LDLCALC 103 (H) 05/15/2019   ALT 30 05/15/2019   AST 17 05/15/2019   NA 139 05/15/2019   K 3.7 05/15/2019   CL 106 05/15/2019   CREATININE 0.74 05/15/2019   BUN 13 05/15/2019   CO2 22 05/15/2019   TSH 2.88 10/08/2017   HGBA1C 5.3 05/15/2019   MICROALBUR 0.9 10/18/2018    Mm 3d Screen Breast Bilateral  Result Date: 07/01/2019 CLINICAL DATA:  Screening. EXAM: DIGITAL SCREENING BILATERAL MAMMOGRAM WITH TOMO AND CAD COMPARISON:  Previous exam(s). ACR Breast Density Category c: The breast tissue is heterogeneously dense, which may obscure small masses. FINDINGS: There are no findings suspicious for malignancy. Images were processed with CAD. IMPRESSION: No mammographic  evidence of malignancy. A result letter of this screening mammogram will be mailed directly to the patient. RECOMMENDATION: Screening mammogram in one year. (Code:SM-B-01Y) BI-RADS CATEGORY  1: Negative. Electronically Signed   By: Fidela Salisbury M.D.   On: 07/01/2019 16:07    Assessment & Plan:   Problem List Items Addressed This Visit      Unprioritized   Posterior right knee pain    Acute onset 2 weeks ago.  DVT ruled out with STAT ultrasound; large Baker's cyst noted.  Orthopedics referral in process      Relevant Orders   Ambulatory referral to Orthopedic Surgery  Bilateral leg weakness    Acquired equinus deformity contributing,  History of right sided paralysis secondary to TBI at age 89.  Myna Bright recently has been having multiple falls and close calls over the last several weeks.  Had 35 session of T at [facility] ending Nov 2019.   PT referral in progress to [employer] photographer)      Relevant Orders   Ambulatory referral to Physical Therapy    Other Visit Diagnoses    Right calf pain    -  Primary   Relevant Orders   ultrasound  right leg (Completed)   Ambulatory referral to Orthopedic Surgery    A total of 40 minutes was spent with patient more than half of which was spent in counseling patient on the above mentioned issues , reviewing and explaining recent labs and imaging studies done, and coordination of care.   I have discontinued Vista Lawman. Vanacker's meloxicam. I am also having her maintain her (multivitamin, stress formula), cetirizine, baclofen, Acetaminophen-Caffeine, carbamazepine, tiZANidine, Ajovy, medroxyPROGESTERone Acetate, albuterol, Arnuity Ellipta, metoprolol succinate, amLODipine, and escitalopram.  No orders of the defined types were placed in this encounter.   Medications Discontinued During This Encounter  Medication Reason   meloxicam (MOBIC) 15 MG tablet Patient Preference    Follow-up: No follow-ups on file.   Crecencio Mc, MD

## 2019-10-21 DIAGNOSIS — R29898 Other symptoms and signs involving the musculoskeletal system: Secondary | ICD-10-CM | POA: Insufficient documentation

## 2019-10-21 NOTE — Assessment & Plan Note (Signed)
Acute onset 2 weeks ago.  DVT ruled out with STAT ultrasound; large Baker's cyst noted.  Orthopedics referral in process

## 2019-10-21 NOTE — Assessment & Plan Note (Addendum)
Acquired equinus deformity contributing,  History of right sided paralysis secondary to TBI at age 38.  More recently has been having multiple falls and close calls over the last several weeks.  Had 35 session of T at [facility] ending Nov 2019.   PT referral in progress to [employer])

## 2019-10-29 DIAGNOSIS — M542 Cervicalgia: Secondary | ICD-10-CM | POA: Diagnosis not present

## 2019-10-29 DIAGNOSIS — M791 Myalgia, unspecified site: Secondary | ICD-10-CM | POA: Diagnosis not present

## 2019-10-29 DIAGNOSIS — G43719 Chronic migraine without aura, intractable, without status migrainosus: Secondary | ICD-10-CM | POA: Diagnosis not present

## 2019-11-11 ENCOUNTER — Encounter: Payer: Self-pay | Admitting: Obstetrics and Gynecology

## 2019-11-11 ENCOUNTER — Other Ambulatory Visit: Payer: Self-pay | Admitting: Obstetrics and Gynecology

## 2019-11-11 ENCOUNTER — Other Ambulatory Visit: Payer: Self-pay

## 2019-11-11 ENCOUNTER — Ambulatory Visit (INDEPENDENT_AMBULATORY_CARE_PROVIDER_SITE_OTHER): Payer: Medicare Other | Admitting: Obstetrics and Gynecology

## 2019-11-11 ENCOUNTER — Other Ambulatory Visit: Payer: Self-pay | Admitting: Internal Medicine

## 2019-11-11 VITALS — BP 129/88 | HR 106 | Ht 63.0 in | Wt 206.0 lb

## 2019-11-11 DIAGNOSIS — Z3042 Encounter for surveillance of injectable contraceptive: Secondary | ICD-10-CM

## 2019-11-11 DIAGNOSIS — Z30013 Encounter for initial prescription of injectable contraceptive: Secondary | ICD-10-CM

## 2019-11-11 DIAGNOSIS — Z01419 Encounter for gynecological examination (general) (routine) without abnormal findings: Secondary | ICD-10-CM

## 2019-11-11 DIAGNOSIS — Z1231 Encounter for screening mammogram for malignant neoplasm of breast: Secondary | ICD-10-CM

## 2019-11-11 DIAGNOSIS — I10 Essential (primary) hypertension: Secondary | ICD-10-CM

## 2019-11-11 DIAGNOSIS — E669 Obesity, unspecified: Secondary | ICD-10-CM

## 2019-11-11 NOTE — Progress Notes (Signed)
GYNECOLOGY ANNUAL PHYSICAL EXAM PROGRESS NOTE  Subjective:    Margaret Zuniga is a 38 y.o. [place] female who presents for an annual exam. The patient has no complaints today. The patient is sexually active. The patient wears seatbelts: yes. The patient participates in regular exercise: no. Has the patient ever been transfused or tattooed?: no. The patient reports that there is not domestic violence in her life.   1. Currently doing physical therapy for Bakers cyst on right leg, also noting some ankle pain. Is awaiting referral to Orthopedics.   Gynecologic History Menarche age: 64 No LMP recorded. Patient has had an injection. Contraception: Depo-Provera injections History of STI's:  Denies Last Pap: 10/2017. Results were: normal.  Denies h/o abnormal pap smears Last mammogram: 08/2019. Results were: normal.   OB History  Gravida Para Term Preterm AB Living  0 0 0 0 0 0  SAB TAB Ectopic Multiple Live Births  0 0 0 0 0    Past Medical History:  Diagnosis Date  . Bilateral nephrolithiasis 03/24/2018  . Essential hypertension, benign 04/04/2016  . Extrinsic asthma 08/15/2016  . 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 Hypertension   . Major depressive disorder, recurrent episode, moderate ([place]) 05/21/2013  . Obesity (BMI 30.0-34.9) 04/11/2017  . Paralysis ([place]) age1   right sided due to head injury, chronic pain since age 1 from [place]  . Personal history of traumatic brain injury 44  . Shoulder impingement 2009   surgical relesase, Dr. Marry Guan    Past Surgical History:  Procedure Laterality Date  . EYE SURGERY  1995  . LEG SURGERY  1985  . SHOULDER SURGERY    . SUBACROMIAL DECOMPRESSION  2000   Right shoulder, [place]  . TONSILLECTOMY  2001    Family History  Problem Relation Age of Onset  . Diabetes Mother   . Coronary artery disease Mother   . Hyperlipidemia Mother   . Hypertension Mother   . Parkinson's  disease Mother   . Heart disease Maternal Grandfather   . Healthy Father     Social History   Socioeconomic History  . Marital status: Single    Spouse name: Not on file  . Number of children: Not on file  . Years of education: Not on file  . Highest education level: Not on file  Occupational History  . Not on file  Social Needs  . Financial resource strain: Not hard at all  . Food insecurity    Worry: Never true    Inability: Never true  . Transportation needs    Medical: No    Non-medical: No  Tobacco Use  . Smoking status: Never Smoker  . Smokeless tobacco: Never Used  Substance and Sexual Activity  . Alcohol use: No  . Drug use: No  . Sexual activity: Yes    Birth control/protection: Injection  Lifestyle  . Physical activity    Days per week: Not on file    Minutes per session: Not on file  . Stress: Only a little  Relationships  . Social Herbalist on phone: Not on file    Gets together: Not on file    Attends religious service: Not on file    Active member of club or organization: Not on file    Attends meetings of clubs or organizations: Not on file    Relationship status: Never married  . Intimate  partner violence    Fear of current or ex partner: No    Emotionally abused: No    Physically abused: No    Forced sexual activity: No  Other Topics Concern  . Not on file  Social History Narrative  . Not on file    Current Outpatient Medications on File Prior to Visit  Medication Sig Dispense Refill  . Acetaminophen-Caffeine (EXCEDRIN TENSION HEADACHE) 500-65 MG TABS Take by mouth.    . AJOVY 225 MG/1.5ML SOSY PLEASE SEE ATTACHED FOR DETAILED DIRECTIONS  0  . albuterol (VENTOLIN HFA) 108 (90 Base) MCG/ACT inhaler Inhale 2 puffs into the lungs every 6 (six) hours as needed for wheezing or shortness of breath. 1 g 5  . amLODipine (NORVASC) 5 MG tablet TAKE 1 TABLET BY MOUTH EVERY DAY 90 tablet 1  . B Complex-C-Folic Acid (MULTIVITAMIN, STRESS  FORMULA) tablet Take 1 tablet by mouth daily.      . baclofen (LIORESAL) 10 MG tablet Take 10 mg by mouth 2 (two) times daily. 2 days a week    . carbamazepine (CARBATROL) 300 MG 12 hr capsule TAKE 1 CAPSULE BY ORAL ROUTE 3 TIMES A DAY FOR 30 DAYS  1  . cetirizine (ZYRTEC) 10 MG tablet Take 10 mg by mouth daily.    Marland Kitchen escitalopram (LEXAPRO) 10 MG tablet TAKE 1 TABLET BY MOUTH EVERY DAY 90 tablet 1  . medroxyPROGESTERone Acetate 150 MG/ML SUSY INJECT 1 ML (150 MG TOTAL) INTO THE MUSCLE EVERY 3 (THREE) MONTHS. 1 Syringe 3  . Melatonin 5 MG CAPS Take by mouth.    . metoprolol succinate (TOPROL-XL) 100 MG 24 hr tablet TAKE 1 TABLET (100 MG TOTAL) BY MOUTH DAILY. TAKE WITH OR IMMEDIATELY FOLLOWING A MEAL. 90 tablet 1  . tiZANidine (ZANAFLEX) 4 MG tablet Take 1 tablet (4 mg total) by mouth every 6 (six) hours as needed for muscle spasms. 60 tablet 2   No current facility-administered medications on file prior to visit.     Allergies  Allergen Reactions  . Zonegran [Zonisamide] Rash     Review of Systems Constitutional: negative for chills, fatigue, fevers and sweats Eyes: negative for irritation, redness and visual disturbance Ears, nose, mouth, throat, and face: negative for hearing loss, nasal congestion, snoring and tinnitus Respiratory: negative for asthma, cough, sputum Cardiovascular: negative for chest pain, dyspnea, exertional chest pressure/discomfort, irregular heart beat, palpitations and syncope Gastrointestinal: negative for abdominal pain, change in bowel habits, nausea and vomiting Genitourinary: negative for abnormal menstrual periods, genital lesions, sexual problems and vaginal discharge, dysuria and urinary incontinence Integument/breast: negative for breast lump, breast tenderness and nipple discharge Hematologic/lymphatic: negative for bleeding and easy bruising Musculoskeletal:negative for back pain and muscle weakness Neurological: negative for dizziness, headaches,  vertigo and weakness Endocrine: negative for diabetic symptoms including polydipsia, polyuria and skin dryness Allergic/Immunologic: negative for hay fever and urticaria        Objective:  Blood pressure 129/88, pulse (!) 106, height 5\' 3"  (1.6 m), weight 206 lb (93.4 kg). Body mass index is 36.49 kg/m.  General Appearance:    Alert, cooperative, no distress, appears stated age, moderately obese  Head:    Normocephalic, without obvious abnormality, atraumatic  Eyes:    PERRL, conjunctiva/corneas clear, EOM's intact, both eyes  Ears:    Normal external ear canals, both ears  Nose:   Nares normal, septum midline, mucosa normal, no drainage or sinus tenderness  Throat:   Lips, mucosa, and tongue normal; teeth and gums normal  Neck:   Supple, symmetrical, trachea midline, no adenopathy; thyroid: no enlargement/tenderness/nodules; no carotid bruit or JVD  Back:     Symmetric, no curvature, ROM normal, no CVA tenderness  Lungs:     Clear to auscultation bilaterally, respirations unlabored  Chest Wall:    No tenderness or deformity   Heart:    Regular rate and rhythm, S1 and S2 normal, no murmur, rub or gallop  Breast Exam:    No tenderness, masses, or nipple abnormality  Abdomen:     Soft, non-tender, bowel sounds active all four quadrants, no masses, no organomegaly.    Genitalia:    Pelvic:external genitalia normal, vagina without lesions, discharge, or tenderness, rectovaginal septum  normal. Cervix normal in appearance, no cervical motion tenderness, no adnexal masses or tenderness.  Uterus normal size, shape, mobile, regular contours, nontender.  Rectal:    Normal external sphincter.  No hemorrhoids appreciated. Internal exam not done.   Extremities:   Extremities normal, atraumatic, no cyanosis or edema  Pulses:   2+ and symmetric all extremities  Skin:   Skin color, texture, turgor normal, no rashes or lesions  Lymph nodes:   Cervical, supraclavicular, and axillary nodes normal   Neurologic:  Grossly intact.  Partial paralysis of left side (face, upper and lower limb)   .  Labs:  Lab Results  Component Value Date   WBC 4.9 03/14/2018   HGB 13.6 03/14/2018   HCT 40.7 03/14/2018   MCV 89.7 03/14/2018   PLT 297 03/14/2018    Lab Results  Component Value Date   CREATININE 0.74 05/15/2019   BUN 13 05/15/2019   NA 139 05/15/2019   K 3.7 05/15/2019   CL 106 05/15/2019   CO2 22 05/15/2019    Lab Results  Component Value Date   ALT 30 05/15/2019   AST 17 05/15/2019   ALKPHOS 80 05/15/2019   BILITOT 0.4 05/15/2019    Lab Results  Component Value Date   TSH 2.88 10/08/2017    Lab Results  Component Value Date   HGBA1C 5.3 05/15/2019   Lab Results  Component Value Date   CHOL 181 05/15/2019   HDL 57.70 05/15/2019   LDLCALC 103 (H) 05/15/2019   LDLDIRECT 109.0 04/17/2016   TRIG 102.0 05/15/2019   CHOLHDL 3 05/15/2019     Assessment:   Healthy female exam.  Moderate obesity  HTN Contraception surveillance   Plan:    Labs: up to date, performed by PCP Breast self exam technique reviewed and patient encouraged to perform self-exam monthly. Contraception: Depo-Provera injections.  Will need Dexa Scan this year if she desires to continue Depo Provera to assess bone density.   Overall doing well on the injections.  Next injection due this month (has an appointment Monday).   Discussed healthy lifestyle modifications. Pap smear up to date. Continue routine screening HTN  management as recommended by PCP.  Up to date on flu vaccine.  Continue mammograms for yearly or every other year breast screenings.  Follow up in 1 year for annual exam.    Rubie Maid, MD [facility]

## 2019-11-11 NOTE — Patient Instructions (Signed)
Health Maintenance, Female Adopting a healthy lifestyle and getting preventive care are important in promoting health and wellness. Ask your health care provider about:  The right schedule for you to have regular tests and exams.  Things you can do on your own to prevent diseases and keep yourself healthy. What should I know about diet, weight, and exercise? Eat a healthy diet   Eat a diet that includes plenty of vegetables, fruits, low-fat dairy products, and lean protein.  Do not eat a lot of foods that are high in solid fats, added sugars, or sodium. Maintain a healthy weight Body mass index (BMI) is used to identify weight problems. It estimates body fat based on height and weight. Your health care provider can help determine your BMI and help you achieve or maintain a healthy weight. Get regular exercise Get regular exercise. This is one of the most important things you can do for your health. Most adults should:  Exercise for at least 150 minutes each week. The exercise should increase your heart rate and make you sweat (moderate-intensity exercise).  Do strengthening exercises at least twice a week. This is in addition to the moderate-intensity exercise.  Spend less time sitting. Even light physical activity can be beneficial. Watch cholesterol and blood lipids Have your blood tested for lipids and cholesterol at 40 years of age, then have this test every 5 years. Have your cholesterol levels checked more often if:  Your lipid or cholesterol levels are high.  You are older than 40 years of age.  You are at high risk for heart disease. What should I know about cancer screening? Depending on your health history and family history, you may need to have cancer screening at various ages. This may include screening for:  Breast cancer.  Cervical cancer.  Colorectal cancer.  Skin cancer.  Lung cancer. What should I know about heart disease, diabetes, and high blood  pressure? Blood pressure and heart disease  High blood pressure causes heart disease and increases the risk of stroke. This is more likely to develop in people who have high blood pressure readings, are of African descent, or are overweight.  Have your blood pressure checked: ? Every 3-5 years if you are 18-39 years of age. ? Every year if you are 40 years old or older. Diabetes Have regular diabetes screenings. This checks your fasting blood sugar level. Have the screening done:  Once every three years after age 40 if you are at a normal weight and have a low risk for diabetes.  More often and at a younger age if you are overweight or have a high risk for diabetes. What should I know about preventing infection? Hepatitis B If you have a higher risk for hepatitis B, you should be screened for this virus. Talk with your health care provider to find out if you are at risk for hepatitis B infection. Hepatitis C Testing is recommended for:  Everyone born from 1945 through 1965.  Anyone with known risk factors for hepatitis C. Sexually transmitted infections (STIs)  Get screened for STIs, including gonorrhea and chlamydia, if: ? You are sexually active and are younger than 40 years of age. ? You are older than 40 years of age and your health care provider tells you that you are at risk for this type of infection. ? Your sexual activity has changed since you were last screened, and you are at increased risk for chlamydia or gonorrhea. Ask your health care provider if   you are at risk.  Ask your health care provider about whether you are at high risk for HIV. Your health care provider may recommend a prescription medicine to help prevent HIV infection. If you choose to take medicine to prevent HIV, you should first get tested for HIV. You should then be tested every 3 months for as long as you are taking the medicine. Pregnancy  If you are about to stop having your period (premenopausal) and  you may become pregnant, seek counseling before you get pregnant.  Take 400 to 800 micrograms (mcg) of folic acid every day if you become pregnant.  Ask for birth control (contraception) if you want to prevent pregnancy. Osteoporosis and menopause Osteoporosis is a disease in which the bones lose minerals and strength with aging. This can result in bone fractures. If you are 68 years old or older, or if you are at risk for osteoporosis and fractures, ask your health care provider if you should:  Be screened for bone loss.  Take a calcium or vitamin D supplement to lower your risk of fractures.  Be given hormone replacement therapy (HRT) to treat symptoms of menopause. Follow these instructions at home: Lifestyle  Do not use any products that contain nicotine or tobacco, such as cigarettes, e-cigarettes, and chewing tobacco. If you need help quitting, ask your health care provider.  Do not use street drugs.  Do not share needles.  Ask your health care provider for help if you need support or information about quitting drugs. Alcohol use  Do not drink alcohol if: ? Your health care provider tells you not to drink. ? You are pregnant, may be pregnant, or are planning to become pregnant.  If you drink alcohol: ? Limit how much you use to 0-1 drink a day. ? Limit intake if you are breastfeeding.  Be aware of how much alcohol is in your drink. In the [place] one drink equals one 12 oz bottle of beer (355 mL), one 5 oz glass of wine (148 mL), or one 1 oz glass of hard liquor (44 mL). General instructions  Schedule regular health, dental, and eye exams.  Stay current with your vaccines.  Tell your health care provider if: ? You often feel depressed. ? You have ever been abused or do not feel safe at home. Summary  Adopting a healthy lifestyle and getting preventive care are important in promoting health and wellness.  Follow your health care provider's instructions about healthy  diet, exercising, and getting tested or screened for diseases.  Follow your health care provider's instructions on monitoring your cholesterol and blood pressure. This information is not intended to replace advice given to you by your health care provider. Make sure you discuss any questions you have with your health care provider. Document Released: 06/12/2011 Document Revised: 11/20/2018 Document Reviewed: 11/20/2018 Elsevier Patient Education  2020 [employer].    Bone Density Test The bone density test uses a special type of X-ray to measure the amount of calcium and other minerals in your bones. It can measure bone density in the hip and the spine. The test procedure is similar to having a regular X-ray. This test may also be called:  Bone densitometry.  Bone mineral density test.  Dual-energy X-ray absorptiometry (DEXA). You may have this test to:  Diagnose a condition that causes weak or thin bones (osteoporosis).  Screen you for osteoporosis.  Predict your risk for a broken bone (fracture).  Determine how well your osteoporosis treatment  is working. Tell a health care provider about:  Any allergies you have.  All medicines you are taking, including vitamins, herbs, eye drops, creams, and over-the-counter medicines.  Any problems you or family members have had with anesthetic medicines.  Any blood disorders you have.  Any surgeries you have had.  Any medical conditions you have.  Whether you are pregnant or may be pregnant.  Any medical tests you have had within the past 14 days that used contrast material. What are the risks? Generally, this is a safe procedure. However, it does expose you to a small amount of radiation, which can slightly increase your cancer risk. What happens before the procedure?  Do not take any calcium supplements starting 24 hours before your test.  Remove all metal jewelry, eyeglasses, dental appliances, and any other metal objects.  What happens during the procedure?   You will lie down on an exam table. There will be an X-ray generator below you and an imaging device above you.  Other devices, such as boxes or braces, may be used to position your body properly for the scan.  The machine will slowly scan your body. You will need to keep still.  The images will show up on a screen in the room. Images will be examined by a specialist after your test is done. The procedure may vary among health care providers and hospitals. What happens after the procedure?  It is up to you to get your test results. Ask your health care provider, or the department that is doing the test, when your results will be ready. Summary  A bone density test is an imaging test that uses a type of X-ray to measure the amount of calcium and other minerals in your bones.  The test may be used to diagnose or screen you for a condition that causes weak or thin bones (osteoporosis), predict your risk for a broken bone (fracture), or determine how well your osteoporosis treatment is working.  Do not take any calcium supplements starting 24 hours before your test.  Ask your health care provider, or the department that is doing the test, when your results will be ready. This information is not intended to replace advice given to you by your health care provider. Make sure you discuss any questions you have with your health care provider. Document Released: 12/19/2004 Document Revised: 12/13/2017 Document Reviewed: 10/01/2017 Elsevier Patient Education  2020 [employer].

## 2019-11-11 NOTE — Progress Notes (Signed)
Pt present for annual exam. Pt stated that she was doing well and denies any issues at this time.  

## 2019-11-17 ENCOUNTER — Other Ambulatory Visit: Payer: Self-pay

## 2019-11-17 ENCOUNTER — Ambulatory Visit (INDEPENDENT_AMBULATORY_CARE_PROVIDER_SITE_OTHER): Payer: Medicare Other | Admitting: Obstetrics and Gynecology

## 2019-11-17 VITALS — BP 134/92 | HR 80 | Ht 63.0 in | Wt 205.0 lb

## 2019-11-17 DIAGNOSIS — Z3042 Encounter for surveillance of injectable contraceptive: Secondary | ICD-10-CM

## 2019-11-17 MED ORDER — MEDROXYPROGESTERONE ACETATE 150 MG/ML IM SUSP
150.0000 mg | Freq: Once | INTRAMUSCULAR | Status: AC
  Administered 2019-11-17: 150 mg via INTRAMUSCULAR

## 2019-11-17 NOTE — Progress Notes (Signed)
Date last pap: 11/05/18 Last Depo-Provera: 09/01/19 Side Effects if any: none. Serum HCG indicated? n/a Depo-Provera 150 mg IM given by: [email or web address]. Next appointment due February 22-Feb 16, 2020.    BP (!) 134/92   Pulse 80   Ht 5\' 3"  (1.6 m)   Wt 205 lb (93 kg)   BMI 36.31 kg/m

## 2019-11-28 ENCOUNTER — Encounter: Payer: Self-pay | Admitting: Obstetrics and Gynecology

## 2019-11-30 ENCOUNTER — Other Ambulatory Visit: Payer: Self-pay | Admitting: Internal Medicine

## 2019-12-02 ENCOUNTER — Other Ambulatory Visit: Payer: Self-pay | Admitting: Internal Medicine

## 2019-12-03 ENCOUNTER — Ambulatory Visit (INDEPENDENT_AMBULATORY_CARE_PROVIDER_SITE_OTHER): Payer: Medicare Other | Admitting: Orthotics

## 2019-12-03 DIAGNOSIS — M25572 Pain in left ankle and joints of left foot: Secondary | ICD-10-CM | POA: Diagnosis not present

## 2019-12-03 DIAGNOSIS — M659 Synovitis and tenosynovitis, unspecified: Secondary | ICD-10-CM

## 2019-12-03 DIAGNOSIS — M216X2 Other acquired deformities of left foot: Secondary | ICD-10-CM | POA: Diagnosis not present

## 2019-12-03 DIAGNOSIS — M216X1 Other acquired deformities of right foot: Secondary | ICD-10-CM

## 2019-12-03 DIAGNOSIS — M25571 Pain in right ankle and joints of right foot: Secondary | ICD-10-CM

## 2019-12-03 DIAGNOSIS — R29898 Other symptoms and signs involving the musculoskeletal system: Secondary | ICD-10-CM

## 2019-12-03 NOTE — Progress Notes (Signed)
Patient came in today to pick up standard Afo brace.  Patient was evaluated for fit and function.   The brace fit very well and there were any complaints of the way it felt once donned.  The brace offered ankle stability in both saggital and coroneal planes.  Patient advised to always wear proper fitting shoes with brace. 

## 2019-12-03 NOTE — Telephone Encounter (Signed)
Looks like this medication has been discontinued.   Last OV: 10/20/2019 Next OV: not scheduled

## 2019-12-09 ENCOUNTER — Other Ambulatory Visit: Payer: Self-pay | Admitting: Internal Medicine

## 2019-12-10 DIAGNOSIS — M791 Myalgia, unspecified site: Secondary | ICD-10-CM | POA: Diagnosis not present

## 2019-12-10 DIAGNOSIS — M542 Cervicalgia: Secondary | ICD-10-CM | POA: Diagnosis not present

## 2019-12-10 DIAGNOSIS — G43719 Chronic migraine without aura, intractable, without status migrainosus: Secondary | ICD-10-CM | POA: Diagnosis not present

## 2019-12-18 DIAGNOSIS — G839 Paralytic syndrome, unspecified: Secondary | ICD-10-CM | POA: Insufficient documentation

## 2019-12-18 DIAGNOSIS — G8190 Hemiplegia, unspecified affecting unspecified side: Secondary | ICD-10-CM | POA: Insufficient documentation

## 2019-12-22 DIAGNOSIS — M25561 Pain in right knee: Secondary | ICD-10-CM | POA: Diagnosis not present

## 2019-12-22 DIAGNOSIS — M7121 Synovial cyst of popliteal space [Baker], right knee: Secondary | ICD-10-CM | POA: Diagnosis not present

## 2019-12-26 ENCOUNTER — Other Ambulatory Visit: Payer: Self-pay

## 2019-12-26 ENCOUNTER — Encounter: Payer: Self-pay | Admitting: Family Medicine

## 2019-12-26 ENCOUNTER — Ambulatory Visit (INDEPENDENT_AMBULATORY_CARE_PROVIDER_SITE_OTHER): Payer: Medicare Other | Admitting: Family Medicine

## 2019-12-26 VITALS — BP 130/88 | HR 89 | Temp 97.4°F | Ht 62.0 in | Wt 206.0 lb

## 2019-12-26 DIAGNOSIS — R350 Frequency of micturition: Secondary | ICD-10-CM | POA: Diagnosis not present

## 2019-12-26 DIAGNOSIS — N3090 Cystitis, unspecified without hematuria: Secondary | ICD-10-CM | POA: Diagnosis not present

## 2019-12-26 LAB — POCT URINALYSIS DIPSTICK
Bilirubin, UA: POSITIVE
Blood, UA: NEGATIVE
Glucose, UA: NEGATIVE
Ketones, UA: POSITIVE
Leukocytes, UA: NEGATIVE
Nitrite, UA: NEGATIVE
Protein, UA: POSITIVE — AB
Spec Grav, UA: 1.025 (ref 1.010–1.025)
Urobilinogen, UA: 0.2 E.U./dL
pH, UA: 6 (ref 5.0–8.0)

## 2019-12-26 MED ORDER — NITROFURANTOIN MONOHYD MACRO 100 MG PO CAPS: 100.0000 mg | ORAL_CAPSULE | Freq: Two times a day (BID) | ORAL | 0 refills | Status: AC

## 2019-12-26 NOTE — Progress Notes (Signed)
Subjective:    Patient ID: Margaret Zuniga, female    DOB: 1981/01/23, 39 y.o.   MRN: [id number]  HPI  This is a 39 yo female who presents today with 3 days of increased frequency of urine. Had one episode of vomiting yesterday a.m. Increased urgency and burning with urination. No back pain.  No fever.  Has noticed some lower abdominal pain and pressure. No recent travel, no recent diarrhea.  She is sexually active and tries to urinate immediately after intercourse regularly.  No prior history of frequent UTI.  Past Medical History:  Diagnosis Date  . Bilateral nephrolithiasis 03/24/2018  . Essential hypertension, benign 04/04/2016  . Extrinsic asthma 08/15/2016  . 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 Hypertension   . Major depressive disorder, recurrent episode, moderate ([place]) 05/21/2013  . Obesity (BMI 30.0-34.9) 04/11/2017  . 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 Personal history of traumatic brain injury 23  . Shoulder impingement 2009   surgical relesase, Dr. Marry Guan   Past Surgical History:  Procedure Laterality Date  . EYE SURGERY  1995  . LEG SURGERY  1985  . SHOULDER SURGERY    . SUBACROMIAL DECOMPRESSION  2000   Right shoulder, [place]  . TONSILLECTOMY  2001   Family History  Problem Relation Age of Onset  . Diabetes Mother   . Coronary artery disease Mother   . Hyperlipidemia Mother   . Hypertension Mother   . Parkinson's disease Mother   . Heart disease Maternal Grandfather   . Healthy Father    Social History   Tobacco Use  . Smoking status: Never Smoker  . Smokeless tobacco: Never Used  Substance Use Topics  . Alcohol use: No  . Drug use: No      Review of Systems Per HPI    Objective:   Physical Exam Physical Exam  Constitutional: She is oriented to person, place, and time. She appears well-developed and well-nourished. No  distress.  HENT:  Head: Normocephalic and atraumatic.  Cardiovascular: Normal rate, regular rhythm and normal heart sounds.   Pulmonary/Chest: Effort normal and breath sounds normal.  Abdominal: Soft. She exhibits no distension. There is no tenderness. There is no rebound, no guarding and no CVA tenderness.  Neurological: She is alert and oriented to person, place, and time.  Skin: Skin is warm and dry. She is not diaphoretic.  Psychiatric: She has a normal mood and affect. Her behavior is normal. Judgment and thought content normal.  Vitals reviewed.   BP 130/88   Pulse 89   Temp (!) 97.4 F (36.3 C)   Ht 5\' 2"  (1.575 m)   Wt 206 lb (93.4 kg)   SpO2 97%   BMI 37.68 kg/m  Wt Readings from Last 3 Encounters:  12/26/19 206 lb (93.4 kg)  11/17/19 205 lb (93 kg)  11/11/19 206 lb (93.4 kg)   Results for orders placed or performed in visit on 12/26/19  POCT Urinalysis Dipstick  Result Value Ref Range   Color, UA dark brown/yellow color    Clarity, UA clear    Glucose, UA Negative Negative   Bilirubin, UA positive    Ketones, UA positive    Spec Grav, UA 1.025 1.010 - 1.025   Blood, UA negative    pH, UA 6.0 5.0 - 8.0   Protein, UA Positive (A)  Negative   Urobilinogen, UA 0.2 0.2 or 1.0 E.U./dL   Nitrite, UA negative    Leukocytes, UA Negative Negative   Appearance     Odor           Assessment & Plan:  1. Frequent urination - POCT Urinalysis Dipstick  2. Cystitis -We will go ahead and treat symptomatically and obtain urine culture -Follow-up/UC/ER precautions reviewed with patient - Urine Culture - nitrofurantoin, macrocrystal-monohydrate, (MACROBID) 100 MG capsule; Take 1 capsule (100 mg total) by mouth 2 (two) times daily for 7 days.  Dispense: 14 capsule; Refill: 0   Margaret Reamer, FNP-BC   [facility]  12/26/2019 1:49 PM

## 2019-12-26 NOTE — Patient Instructions (Signed)
Good to see you today   Urinary Tract Infection, Adult A urinary tract infection (UTI) is an infection of any part of the urinary tract. The urinary tract includes:  The kidneys.  The ureters.  The bladder.  The urethra. These organs make, store, and get rid of pee (urine) in the body. What are the causes? This is caused by germs (bacteria) in your genital area. These germs grow and cause swelling (inflammation) of your urinary tract. What increases the risk? You are more likely to develop this condition if:  You have a small, thin tube (catheter) to drain pee.  You cannot control when you pee or poop (incontinence).  You are female, and: ? You use these methods to prevent pregnancy:  A medicine that kills sperm (spermicide).  A device that blocks sperm (diaphragm). ? You have low levels of a female hormone (estrogen). ? You are pregnant.  You have genes that add to your risk.  You are sexually active.  You take antibiotic medicines.  You have trouble peeing because of: ? A prostate that is bigger than normal, if you are female. ? A blockage in the part of your body that drains pee from the bladder (urethra). ? A kidney stone. ? A nerve condition that affects your bladder (neurogenic bladder). ? Not getting enough to drink. ? Not peeing often enough.  You have other conditions, such as: ? Diabetes. ? A weak disease-fighting system (immune system). ? Sickle cell disease. ? Gout. ? Injury of the spine. What are the signs or symptoms? Symptoms of this condition include:  Needing to pee right away (urgently).  Peeing often.  Peeing small amounts often.  Pain or burning when peeing.  Blood in the pee.  Pee that smells bad or not like normal.  Trouble peeing.  Pee that is cloudy.  Fluid coming from the vagina, if you are female.  Pain in the belly or lower back. Other symptoms include:  Throwing up (vomiting).  No urge to eat.  Feeling mixed up  (confused).  Being tired and grouchy (irritable).  A fever.  Watery poop (diarrhea). How is this treated? This condition may be treated with:  Antibiotic medicine.  Other medicines.  Drinking enough water. Follow these instructions at home:  Medicines  Take over-the-counter and prescription medicines only as told by your doctor.  If you were prescribed an antibiotic medicine, take it as told by your doctor. Do not stop taking it even if you start to feel better. General instructions  Make sure you: ? Pee until your bladder is empty. ? Do not hold pee for a long time. ? Empty your bladder after sex. ? Wipe from front to back after pooping if you are a female. Use each tissue one time when you wipe.  Drink enough fluid to keep your pee pale yellow.  Keep all follow-up visits as told by your doctor. This is important. Contact a doctor if:  You do not get better after 1-2 days.  Your symptoms go away and then come back. Get help right away if:  You have very bad back pain.  You have very bad pain in your lower belly.  You have a fever.  You are sick to your stomach (nauseous).  You are throwing up. Summary  A urinary tract infection (UTI) is an infection of any part of the urinary tract.  This condition is caused by germs in your genital area.  There are many risk factors for a   UTI. These include having a small, thin tube to drain pee and not being able to control when you pee or poop.  Treatment includes antibiotic medicines for germs.  Drink enough fluid to keep your pee pale yellow. This information is not intended to replace advice given to you by your health care provider. Make sure you discuss any questions you have with your health care provider. Document Revised: 11/14/2018 Document Reviewed: 06/06/2018 Elsevier Patient Education  2020 [employer]  

## 2019-12-27 LAB — URINE CULTURE
MICRO NUMBER:: 10047107
SPECIMEN QUALITY:: ADEQUATE

## 2020-01-01 ENCOUNTER — Telehealth: Payer: Self-pay | Admitting: Internal Medicine

## 2020-01-01 ENCOUNTER — Other Ambulatory Visit: Payer: Self-pay | Admitting: Internal Medicine

## 2020-01-01 NOTE — Telephone Encounter (Signed)
Pt dropped off parking placard form to be filled out. Placed in Dr. Demetrios Isaacs color folder upfront

## 2020-01-05 NOTE — Telephone Encounter (Signed)
Pt's father will pick the form up tomorrow.

## 2020-01-13 ENCOUNTER — Telehealth: Payer: Self-pay | Admitting: Internal Medicine

## 2020-01-13 DIAGNOSIS — N200 Calculus of kidney: Secondary | ICD-10-CM | POA: Diagnosis not present

## 2020-01-13 DIAGNOSIS — R350 Frequency of micturition: Secondary | ICD-10-CM

## 2020-01-13 NOTE — Telephone Encounter (Signed)
Spoke with pt and she stated that last night around 2 AM she woke up with urinary urgency and then got up several more times during the night to urinate. She stated that since last night she has had some diarrhea and nausea with the frequency and urgency. Pt's dad is going to bring a urine sample to the office tomorrow for pt and pt is scheduled for a telephone visit on Friday morning.

## 2020-01-13 NOTE — Telephone Encounter (Signed)
Orders have been placed.

## 2020-01-13 NOTE — Telephone Encounter (Signed)
Pt's father called and stated that pt is having problems with her bladder and leaking at night-keeping her awake. Please call pt back.

## 2020-01-14 ENCOUNTER — Emergency Department: Payer: Medicare Other

## 2020-01-14 ENCOUNTER — Other Ambulatory Visit: Payer: Self-pay

## 2020-01-14 ENCOUNTER — Other Ambulatory Visit (INDEPENDENT_AMBULATORY_CARE_PROVIDER_SITE_OTHER): Payer: Medicare Other

## 2020-01-14 ENCOUNTER — Emergency Department
Admission: EM | Admit: 2020-01-14 | Discharge: 2020-01-14 | Disposition: A | Discharge status: Home / Self Care | Payer: Medicare Other | Attending: Emergency Medicine | Admitting: Emergency Medicine

## 2020-01-14 DIAGNOSIS — N200 Calculus of kidney: Secondary | ICD-10-CM | POA: Diagnosis not present

## 2020-01-14 DIAGNOSIS — R109 Unspecified abdominal pain: Secondary | ICD-10-CM | POA: Diagnosis present

## 2020-01-14 DIAGNOSIS — R350 Frequency of micturition: Secondary | ICD-10-CM | POA: Diagnosis not present

## 2020-01-14 DIAGNOSIS — Z79899 Other long term (current) drug therapy: Secondary | ICD-10-CM | POA: Diagnosis not present

## 2020-01-14 DIAGNOSIS — Z85828 Personal history of other malignant neoplasm of skin: Secondary | ICD-10-CM | POA: Diagnosis not present

## 2020-01-14 DIAGNOSIS — I10 Essential (primary) hypertension: Secondary | ICD-10-CM | POA: Diagnosis not present

## 2020-01-14 DIAGNOSIS — N132 Hydronephrosis with renal and ureteral calculous obstruction: Secondary | ICD-10-CM | POA: Diagnosis not present

## 2020-01-14 DIAGNOSIS — J45909 Unspecified asthma, uncomplicated: Secondary | ICD-10-CM | POA: Insufficient documentation

## 2020-01-14 LAB — COMPREHENSIVE METABOLIC PANEL
ALT: 22 U/L (ref 0–44)
AST: 18 U/L (ref 15–41)
Albumin: 4.6 g/dL (ref 3.5–5.0)
Alkaline Phosphatase: 87 U/L (ref 38–126)
Anion gap: 7 (ref 5–15)
BUN: 14 mg/dL (ref 6–20)
CO2: 25 mmol/L (ref 22–32)
Calcium: 9.5 mg/dL (ref 8.9–10.3)
Chloride: 110 mmol/L (ref 98–111)
Creatinine, Ser: 0.88 mg/dL (ref 0.44–1.00)
GFR calc Af Amer: >60 mL/min (ref >60)
GFR calc non Af Amer: >60 mL/min (ref >60)
Glucose, Bld: 125 mg/dL — ABNORMAL HIGH (ref 70–99)
Potassium: 3.6 mmol/L (ref 3.5–5.1)
Sodium: 142 mmol/L (ref 135–145)
Total Bilirubin: 0.5 mg/dL (ref 0.3–1.2)
Total Protein: 7.5 g/dL (ref 6.5–8.1)

## 2020-01-14 LAB — CBC
HCT: 40.3 % (ref 36.0–46.0)
Hemoglobin: 13.4 g/dL (ref 12.0–15.0)
MCH: 30.4 pg (ref 26.0–34.0)
MCHC: 33.3 g/dL (ref 30.0–36.0)
MCV: 91.4 fL (ref 80.0–100.0)
Platelets: 300 10*3/uL (ref 150–400)
RBC: 4.41 MIL/uL (ref 3.87–5.11)
RDW: 12.6 % (ref 11.5–15.5)
WBC: 9.7 10*3/uL (ref 4.0–10.5)
nRBC: 0 % (ref 0.0–0.2)

## 2020-01-14 LAB — URINALYSIS, COMPLETE (UACMP) WITH MICROSCOPIC
Bilirubin Urine: NEGATIVE
Glucose, UA: NEGATIVE mg/dL
Ketones, ur: 20 mg/dL — AB
Leukocytes,Ua: NEGATIVE
Nitrite: NEGATIVE
Protein, ur: NEGATIVE mg/dL
RBC / HPF: >50 RBC/hpf — ABNORMAL HIGH (ref 0–5)
Specific Gravity, Urine: 1.026 (ref 1.005–1.030)
pH: 5 (ref 5.0–8.0)

## 2020-01-14 MED ORDER — KETOROLAC TROMETHAMINE 30 MG/ML IJ SOLN
30.0000 mg | Freq: Once | INTRAMUSCULAR | Status: AC
  Administered 2020-01-14: 30 mg via INTRAVENOUS
  Filled 2020-01-14: qty 1

## 2020-01-14 MED ORDER — HYDROMORPHONE HCL 1 MG/ML IJ SOLN
1.0000 mg | Freq: Once | INTRAMUSCULAR | Status: AC
  Administered 2020-01-14: 1 mg via INTRAVENOUS
  Filled 2020-01-14: qty 1

## 2020-01-14 MED ORDER — MORPHINE SULFATE (PF) 4 MG/ML IV SOLN
4.0000 mg | Freq: Once | INTRAVENOUS | Status: AC
  Administered 2020-01-14: 4 mg via INTRAVENOUS
  Filled 2020-01-14: qty 1

## 2020-01-14 MED ORDER — OXYCODONE-ACETAMINOPHEN 5-325 MG PO TABS: 1.0000 | ORAL_TABLET | ORAL | 0 refills | Status: DC | PRN

## 2020-01-14 NOTE — ED Triage Notes (Signed)
Pt in with co left flank paint that started tonight. States does have urinary frequency.

## 2020-01-14 NOTE — ED Provider Notes (Signed)
[facility] Emergency Department Provider Note  ____________________________________________   First MD Initiated Contact with Patient 01/14/20 [phone]     (approximate)  I have reviewed the triage vital signs and the nursing notes.   HISTORY  Chief Complaint Flank Pain    HPI Margaret Zuniga is a 39 y.o. female with below list of previous medical conditions presents to the emergency department secondary to acute onset of left flank pain at 9 PM tonight.  Patient states that current pain score is 10 out of 10.  Patient denies any nausea no vomiting diarrhea constipation.  Patient does admit to urinary frequency however no dysuria or hematuria        Past Medical History:  Diagnosis Date  . Bilateral nephrolithiasis 03/24/2018  . Essential hypertension, benign 04/04/2016  . Extrinsic asthma 08/15/2016  . 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 Hypertension   . Major depressive disorder, recurrent episode, moderate ([place]) 05/21/2013  . Obesity (BMI 30.0-34.9) 04/11/2017  . 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 Personal history of traumatic brain injury 14  . Shoulder impingement 2009   surgical relesase, Dr. Marry Guan    Patient Active Problem List   Diagnosis Date Noted  . Bilateral leg weakness 10/21/2019  . Depression 06/19/2019  . Acute right ankle pain 06/03/2019  . Educated about COVID-19 virus infection 06/03/2019  . Bilateral nephrolithiasis 03/24/2018  . 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  . Obesity, Class II, BMI 35-39.9 04/11/2017  . Elevated liver enzymes 04/11/2017  . Extrinsic asthma 08/15/2016  . Solitary pulmonary nodule 07/03/2016  . Essential hypertension, benign 04/04/2016  . H/O varicella 08/19/2015  . 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 Bilateral thoracic back pain 04/21/2014    . Encounter for preventive health examination 04/21/2014  . Major depressive disorder, recurrent episode, moderate ([place]) 05/21/2013  . Posterior right knee pain 03/26/2013  . Personal history of traumatic brain injury   . 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 Paralysis [facility])     Past Surgical History:  Procedure Laterality Date  . EYE SURGERY  1995  . LEG SURGERY  1985  . SHOULDER SURGERY    . SUBACROMIAL DECOMPRESSION  2000   Right shoulder, [place]  . TONSILLECTOMY  2001    Prior to Admission medications   Medication Sig Start Date End Date Taking? Authorizing Provider  Acetaminophen-Caffeine (EXCEDRIN TENSION HEADACHE) 500-65 MG TABS Take by mouth.    [provider]  AJOVY 225 MG/1.5ML SOSY PLEASE SEE ATTACHED FOR DETAILED DIRECTIONS 10/03/18   [provider]  albuterol (VENTOLIN HFA) 108 (90 Base) MCG/ACT inhaler TAKE 2 PUFFS BY MOUTH EVERY 6 HOURS AS NEEDED FOR WHEEZE OR SHORTNESS OF BREATH 12/01/19   Flora Lipps, MD  amLODipine (NORVASC) 5 MG tablet TAKE 1 TABLET BY MOUTH EVERY DAY 09/22/19   Crecencio Mc, MD  ARNUITY ELLIPTA 100 MCG/ACT AEPB INHALE 1 PUFF BY MOUTH EVERY DAY 01/01/20   Flora Lipps, MD  B Complex-C-Folic Acid (MULTIVITAMIN, STRESS FORMULA) tablet Take 1 tablet by mouth daily.      [provider]  baclofen (LIORESAL) 10 MG tablet Take 10 mg by mouth 2 (two) times daily. 2 days a week 11/26/12   [provider]  carbamazepine (CARBATROL) 300 MG 12 hr capsule TAKE 1 CAPSULE BY  ORAL ROUTE 3 TIMES A DAY FOR 30 DAYS 04/04/17   [provider]  cetirizine (ZYRTEC) 10 MG tablet Take 10 mg by mouth daily.    [provider]  escitalopram (LEXAPRO) 10 MG tablet TAKE 1 TABLET BY MOUTH EVERY DAY 09/22/19   Crecencio Mc, MD  medroxyPROGESTERone Acetate 150 MG/ML SUSY INJECT 1 ML (150 MG TOTAL) INTO THE MUSCLE EVERY 3 (THREE) MONTHS. 11/11/19   Rubie Maid, MD  Melatonin 5 MG CAPS Take by  mouth.    [provider]  meloxicam (MOBIC) 15 MG tablet TAKE 1 TABLET BY MOUTH EVERY DAY 12/04/19   Crecencio Mc, MD  metoprolol succinate (TOPROL-XL) 100 MG 24 hr tablet TAKE 1 TABLET (100 MG TOTAL) BY MOUTH DAILY. TAKE WITH OR IMMEDIATELY FOLLOWING A MEAL. 09/22/19   Crecencio Mc, MD    Allergies Zonegran [zonisamide]  Family History  Problem Relation Age of Onset  . Diabetes Mother   . Coronary artery disease Mother   . Hyperlipidemia Mother   . Hypertension Mother   . Parkinson's disease Mother   . Heart disease Maternal Grandfather   . Healthy Father     Social History Social History   Tobacco Use  . Smoking status: Never Smoker  . Smokeless tobacco: Never Used  Substance Use Topics  . Alcohol use: No  . Drug use: No    Review of Systems Constitutional: No fever/chills Eyes: No visual changes. ENT: No sore throat. Cardiovascular: Denies chest pain. Respiratory: Denies shortness of breath. Gastrointestinal: Positive for left flank pain no vomiting.  No diarrhea.  No constipation. Genitourinary: Negative for dysuria. Musculoskeletal: Negative for neck pain.  Negative for back pain. Integumentary: Negative for rash. Neurological: Negative for headaches, focal weakness or numbness.   ____________________________________________   PHYSICAL EXAM:  VITAL SIGNS: ED Triage Vitals  Enc Vitals Group     BP 01/14/20 0117 (!) 157/103     Pulse Rate 01/14/20 0117 91     Resp 01/14/20 0117 20     Temp 01/14/20 0117 98.1 F (36.7 C)     Temp Source 01/14/20 0117 Oral     SpO2 01/14/20 0117 97 %     Weight 01/14/20 0118 91.2 kg (201 lb)     Height --      Head Circumference --      Peak Flow --      Pain Score 01/14/20 0118 10     Pain Loc --      Pain Edu? --      Excl. in [place]? --     Constitutional: Alert and oriented.  Apparent discomfort  eyes: Conjunctivae are normal.  Mouth/Throat: Patient is wearing a mask. Neck: No stridor.  No  meningeal signs.   Cardiovascular: Normal rate, regular rhythm. Good peripheral circulation. Grossly normal heart sounds. Respiratory: Normal respiratory effort.  No retractions. Gastrointestinal: Soft and nontender. No distention.  Musculoskeletal: No lower extremity tenderness nor edema. No gross deformities of extremities. Neurologic:  Normal speech and language. No gross focal neurologic deficits are appreciated.  Skin:  Skin is warm, dry and intact. Psychiatric: Mood and affect are normal. Speech and behavior are normal.  ____________________________________________   LABS (all labs ordered are listed, but only abnormal results are displayed)  Labs Reviewed  COMPREHENSIVE METABOLIC PANEL - Abnormal; Notable for the following components:      Result Value   Glucose, Bld 125 (*)    All other components within normal limits  URINALYSIS,  COMPLETE (UACMP) WITH MICROSCOPIC - Abnormal; Notable for the following components:   Color, Urine YELLOW (*)    APPearance HAZY (*)    Hgb urine dipstick SMALL (*)    Ketones, ur 20 (*)    RBC / HPF >50 (*)    Bacteria, UA RARE (*)    All other components within normal limits  CBC     RADIOLOGY I, [place] N Ianna Salmela, personally viewed and evaluated these images (plain radiographs) as part of my medical decision making, as well as reviewing the written report by the radiologist.  ED MD interpretation: Moderate left-sided hydroureteronephrosis secondary to obstructing set of stones of the distal left ureter measuring 5 to 6 mm per radiologist.  Official radiology report(s): CT Renal Stone Study  Result Date: 01/14/2020 CLINICAL DATA:  Left-sided flank pain. EXAM: CT ABDOMEN AND PELVIS WITHOUT CONTRAST TECHNIQUE: Multidetector CT imaging of the abdomen and pelvis was performed following the standard protocol without IV contrast. COMPARISON:  March 14, 2018 FINDINGS: Lower chest: The lung bases are clear. The heart size is normal. Hepatobiliary: The  liver is normal. Normal gallbladder.There is no biliary ductal dilation. Pancreas: Normal contours without ductal dilatation. No peripancreatic fluid collection. Spleen: No splenic laceration or hematoma. Adrenals/Urinary Tract: --Adrenal glands: No adrenal hemorrhage. --Right kidney/ureter: No hydronephrosis or perinephric hematoma. --Left kidney/ureter: There is moderate left-sided hydroureteronephrosis secondary to an obstructing set of stones in the distal left ureter measuring up to approximately 5-6 mm (axial series 2, image 82). There is a punctate residual stone in the upper pole the left kidney (coronal series 5, image 86). --Urinary bladder: The urinary bladder is decompressed and therefore is poorly evaluated. Stomach/Bowel: --Stomach/Duodenum: No hiatal hernia or other gastric abnormality. Normal duodenal course and caliber. --Small bowel: No dilatation or inflammation. --Colon: No focal abnormality. --Appendix: Normal. Vascular/Lymphatic: Normal course and caliber of the major abdominal vessels. --No retroperitoneal lymphadenopathy. --No mesenteric lymphadenopathy. --No pelvic or inguinal lymphadenopathy. Reproductive: Unremarkable Other: No ascites or free air. The abdominal wall is normal. Musculoskeletal. No acute displaced fractures. IMPRESSION: Moderate left-sided hydroureteronephrosis secondary to an obstructing set of stones in the distal left ureter measuring up to 5-6 mm. Electronically Signed   By: Constance Holster M.D.   On: 01/14/2020 03:31     Procedures   ____________________________________________   INITIAL IMPRESSION / MDM / [place] / ED COURSE  As part of my medical decision making, I reviewed the following data within the electronic MEDICAL RECORD NUMBER   39 year old female presented with above-stated history and physical exam concerning for possible left ureterolithiasis which was confirmed on CT.  Patient given morphine 4 mg IV with minimal improvement of  pain as such patient was given Dilaudid 1 mg.  After review the patient scan patient given Toradol 30 mg as well. Pain resolved. Patient referred to Dr Bernardo Heater urology for outpatient followup      ____________________________________________  FINAL CLINICAL IMPRESSION(S) / ED DIAGNOSES  Final diagnoses:  Kidney stone on left side     MEDICATIONS GIVEN DURING THIS VISIT:  Medications  morphine 4 MG/ML injection 4 mg (4 mg Intravenous Given 01/14/20 0310)  HYDROmorphone (DILAUDID) injection 1 mg (1 mg Intravenous Given 01/14/20 0434)  ketorolac (TORADOL) 30 MG/ML injection 30 mg (30 mg Intravenous Given 01/14/20 0434)     ED Discharge Orders    None      *Please note:  [name] was evaluated in Emergency Department on 01/14/2020 for the symptoms described in the  history of present illness. She was evaluated in the context of the global COVID-19 pandemic, which necessitated consideration that the patient might be at risk for infection with the SARS-CoV-2 virus that causes COVID-19. Institutional protocols and algorithms that pertain to the evaluation of patients at risk for COVID-19 are in a state of rapid change based on information released by regulatory bodies including the CDC and federal and state organizations. These policies and algorithms were followed during the patient's care in the ED.  Some ED evaluations and interventions may be delayed as a result of limited staffing during the pandemic.*  Note:  This document was prepared using Dragon voice recognition software and may include unintentional dictation errors.   Gregor Hams, MD 01/14/20 [phone]

## 2020-01-14 NOTE — ED Notes (Signed)
Pt transported to CT ?

## 2020-01-15 LAB — URINALYSIS, ROUTINE W REFLEX MICROSCOPIC
Bilirubin Urine: NEGATIVE
Ketones, ur: NEGATIVE
Leukocytes,Ua: NEGATIVE
Nitrite: NEGATIVE
Specific Gravity, Urine: >=1.03 — AB (ref 1.000–1.030)
Urine Glucose: NEGATIVE
Urobilinogen, UA: 0.2 (ref 0.0–1.0)
pH: 5.5 (ref 5.0–8.0)

## 2020-01-15 LAB — URINE CULTURE
MICRO NUMBER:: 10111770
SPECIMEN QUALITY:: ADEQUATE

## 2020-01-16 ENCOUNTER — Ambulatory Visit (INDEPENDENT_AMBULATORY_CARE_PROVIDER_SITE_OTHER): Payer: Medicare Other | Admitting: Internal Medicine

## 2020-01-16 ENCOUNTER — Encounter: Payer: Self-pay | Admitting: Internal Medicine

## 2020-01-16 ENCOUNTER — Other Ambulatory Visit: Payer: Self-pay

## 2020-01-16 VITALS — BP 128/88 | Ht 62.0 in | Wt 201.0 lb

## 2020-01-16 DIAGNOSIS — N132 Hydronephrosis with renal and ureteral calculous obstruction: Secondary | ICD-10-CM | POA: Diagnosis not present

## 2020-01-16 MED ORDER — OXYCODONE-ACETAMINOPHEN 5-325 MG PO TABS: 1.0000 | ORAL_TABLET | ORAL | 0 refills | Status: DC | PRN

## 2020-01-16 NOTE — Assessment & Plan Note (Addendum)
Left sided, by renal stone CT done Feb 3 by ER.  Urgent urology referral made. Patient advised to increase water and citric acid intake,  Will refill percocet for use until urology can see her. Urine culture negative for infection; no indication for abx   Lab Results  Component Value Date   CREATININE 0.88 01/14/2020

## 2020-01-16 NOTE — Progress Notes (Signed)
Telephone Note  This visit type was conducted due to national recommendations for restrictions regarding the COVID-19 pandemic (e.g. social distancing).  This format is felt to be most appropriate for this patient at this time.  All issues noted in this document were discussed and addressed.  No physical exam was performed (except for noted visual exam findings with Video Visits).   I connected with@ on 01/16/20 at  9:00 AM EST by  telephone and verified that I am speaking with the correct person using two identifiers. Location patient: home Location provider: work or home office Persons participating in the virtual visit: patient, provider  I discussed the limitations, risks, security and privacy concerns of performing an evaluation and management service by telephone and the availability of in person appointments. I also discussed with the patient that there may be a patient responsible charge related to this service. The patient expressed understanding and agreed to proceed.   Reason for visit: ER follow up for obstructing uretereal calculi with left hydronephrosis  HPI:  39 yr old female with history of bilateral nephrolithiasis  Presents after ER evaluation on Feb 3 for severe left sided flank pain of less than 24 hours duration.  CT renal stone study was done and noted moderate left hydronephrosis due to multiple proximal left ureteral stones.  Was told to follow up with the on  Call urologist [facility],  But has not scheduled an appointment.  Has been taking 1-2 percocet daily for pain control  since ER evaluation .  Denies nausea,  Fevers and gross hematuria.    Labs and CT reviewed.  Slight increase in Cr but in normal range.  CBC normal.  UA /micro dirty but no UTI by culture.  Has not been maintaining adequate hydration and use of citric acid    ROS: See pertinent positives and negatives per HPI.  Past Medical History:  Diagnosis Date  . Bilateral nephrolithiasis 03/24/2018  .  Essential hypertension, benign 04/04/2016  . Extrinsic asthma 08/15/2016  . 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 Hypertension   . Major depressive disorder, recurrent episode, moderate ([place]) 05/21/2013  . Obesity (BMI 30.0-34.9) 04/11/2017  . 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 Personal history of traumatic brain injury 64  . Shoulder impingement 2009   surgical relesase, Dr. Marry Guan    Past Surgical History:  Procedure Laterality Date  . EYE SURGERY  1995  . LEG SURGERY  1985  . SHOULDER SURGERY    . SUBACROMIAL DECOMPRESSION  2000   Right shoulder, [place]  . TONSILLECTOMY  2001    Family History  Problem Relation Age of Onset  . Diabetes Mother   . Coronary artery disease Mother   . Hyperlipidemia Mother   . Hypertension Mother   . Parkinson's disease Mother   . Heart disease Maternal Grandfather   . Healthy Father     SOCIAL HX:  reports that she has never smoked. She has never used smokeless tobacco. She reports that she does not drink alcohol or use drugs.   Current Outpatient Medications:  .  Acetaminophen-Caffeine (EXCEDRIN TENSION HEADACHE) 500-65 MG TABS, Take by mouth., Disp: , Rfl:  .  albuterol (VENTOLIN HFA) 108 (90 Base) MCG/ACT inhaler, TAKE 2 PUFFS BY MOUTH EVERY 6 HOURS AS NEEDED FOR WHEEZE OR SHORTNESS OF BREATH, Disp: 18 g, Rfl: 0 .  amLODipine (NORVASC) 5 MG tablet, TAKE 1 TABLET BY MOUTH EVERY DAY, Disp: 90 tablet, Rfl: 1 .  ARNUITY ELLIPTA 100 MCG/ACT AEPB, INHALE 1 PUFF BY MOUTH EVERY DAY, Disp: 30 each, Rfl: 0 .  B Complex-C-Folic Acid (MULTIVITAMIN, STRESS FORMULA) tablet, Take 1 tablet by mouth daily.  , Disp: , Rfl:  .  baclofen (LIORESAL) 10 MG tablet, Take 10 mg by mouth 2 (two) times daily. 2 days a week, Disp: , Rfl:  .  carbamazepine (CARBATROL) 300 MG 12 hr capsule, TAKE 1 CAPSULE BY ORAL ROUTE 3 TIMES A DAY FOR 30 DAYS,  Disp: , Rfl: 1 .  cetirizine (ZYRTEC) 10 MG tablet, Take 10 mg by mouth daily., Disp: , Rfl:  .  escitalopram (LEXAPRO) 10 MG tablet, TAKE 1 TABLET BY MOUTH EVERY DAY, Disp: 90 tablet, Rfl: 1 .  medroxyPROGESTERone Acetate 150 MG/ML SUSY, INJECT 1 ML (150 MG TOTAL) INTO THE MUSCLE EVERY 3 (THREE) MONTHS., Disp: 1 mL, Rfl: 3 .  Melatonin 5 MG CAPS, Take by mouth., Disp: , Rfl:  .  meloxicam (MOBIC) 15 MG tablet, TAKE 1 TABLET BY MOUTH EVERY DAY, Disp: 30 tablet, Rfl: 5 .  metoprolol succinate (TOPROL-XL) 100 MG 24 hr tablet, TAKE 1 TABLET (100 MG TOTAL) BY MOUTH DAILY. TAKE WITH OR IMMEDIATELY FOLLOWING A MEAL., Disp: 90 tablet, Rfl: 1 .  [START ON 01/19/2020] oxyCODONE-acetaminophen (PERCOCET) 5-325 MG tablet, Take 1 tablet by mouth every 4 (four) hours as needed for severe pain., Disp: 20 tablet, Rfl: 0  EXAM:  VITALS per patient if applicable:  GENERAL: alert, oriented, sounds not to be in  acute distress  General impression: alert, cooperative and articulate.  No signs of being in distress  Lungs: speech is fluent sentence length suggests that patient is not short of breath and not punctuated by cough, sneezing or sniffing. Marland Kitchen   Psych: affect normal.  speech is articulate and non pressured .  Denies suicidal thoughts    ASSESSMENT AND PLAN:  Discussed the following assessment and plan:  Hydronephrosis concurrent with and due to calculi of kidney and ureter - Plan: Ambulatory referral to Urology  Hydronephrosis concurrent with and due to calculi of kidney and ureter Left sided, by renal stone CT done Feb 3 by ER.  Urgent urology referral made. Patient advised to increase water and citric acid intake,  Will refill percocet for use until urology can see her. Urine culture negative for infection; no indication for abx   Lab Results  Component Value Date   CREATININE 0.88 01/14/2020     I provided  25 minutes of non-face-to-face time during this encounter reviewing patient's current  problems and past procedures/imaging studies, providing counseling on the above mentioned problems , and coordination  of care .   I discussed the assessment and treatment plan with the patient. The patient was provided an opportunity to ask questions and all were answered. The patient agreed with the plan and demonstrated an understanding of the instructions.   The patient was advised to call back or seek an in-person evaluation if the symptoms worsen or if the condition fails to improve as anticipated.   Crecencio Mc, MD

## 2020-01-21 ENCOUNTER — Ambulatory Visit (INDEPENDENT_AMBULATORY_CARE_PROVIDER_SITE_OTHER): Payer: Medicare Other | Admitting: Urology

## 2020-01-21 ENCOUNTER — Encounter: Payer: Self-pay | Admitting: Urology

## 2020-01-21 ENCOUNTER — Other Ambulatory Visit
Admission: RE | Admit: 2020-01-21 | Discharge: 2020-01-21 | Disposition: A | Discharge status: Home / Self Care | Payer: Medicare Other | Source: Ambulatory Visit | Attending: Urology | Admitting: Urology

## 2020-01-21 ENCOUNTER — Other Ambulatory Visit: Payer: Self-pay

## 2020-01-21 ENCOUNTER — Ambulatory Visit
Admission: RE | Admit: 2020-01-21 | Discharge: 2020-01-21 | Disposition: A | Discharge status: Home / Self Care | Payer: Medicare Other | Source: Ambulatory Visit | Attending: Urology | Admitting: Urology

## 2020-01-21 ENCOUNTER — Other Ambulatory Visit: Payer: Self-pay | Admitting: Radiology

## 2020-01-21 ENCOUNTER — Ambulatory Visit
Admission: RE | Admit: 2020-01-21 | Discharge: 2020-01-21 | Disposition: A | Discharge status: Home / Self Care | Payer: Medicare Other | Attending: Urology | Admitting: Urology

## 2020-01-21 VITALS — BP 137/91 | HR 89 | Ht 63.0 in | Wt 201.0 lb

## 2020-01-21 DIAGNOSIS — N23 Unspecified renal colic: Secondary | ICD-10-CM | POA: Diagnosis not present

## 2020-01-21 DIAGNOSIS — N200 Calculus of kidney: Secondary | ICD-10-CM | POA: Diagnosis not present

## 2020-01-21 DIAGNOSIS — N201 Calculus of ureter: Secondary | ICD-10-CM | POA: Diagnosis not present

## 2020-01-21 DIAGNOSIS — Z20822 Contact with and (suspected) exposure to covid-19: Secondary | ICD-10-CM | POA: Diagnosis not present

## 2020-01-21 DIAGNOSIS — N202 Calculus of kidney with calculus of ureter: Secondary | ICD-10-CM | POA: Diagnosis not present

## 2020-01-21 DIAGNOSIS — N132 Hydronephrosis with renal and ureteral calculous obstruction: Secondary | ICD-10-CM | POA: Diagnosis not present

## 2020-01-21 LAB — SARS CORONAVIRUS 2 (TAT 6-24 HRS): SARS Coronavirus 2: NEGATIVE

## 2020-01-21 NOTE — Progress Notes (Signed)
01/21/2020 4:00 PM   Margaret Zuniga 14-Jan-1981 [id number]  Referring provider: Crecencio Mc, MD [place],  [place] [address]  Chief Complaint  Patient presents with  . Nephrolithiasis    HPI: Margaret Zuniga is a 39 y.o. female who presented to the [facility] ED on 01/14/2020 with acute onset of left flank pain which she rated 10/10.  No identifiable precipitating, aggravating or alleviating factors.  Denied fever, chills, nausea or vomiting.  A stone protocol CT was performed which showed a 6 mm left distal ureteral calculus with moderate proximal hydronephrosis/hydroureter.  Her pain was controlled with parenteral analgesics and and it does not look like she was discharged on outpatient medication.  She saw Dr. Derrel Nip on 2/5 and was given Rx oxycodone.  She continues to have intermittent pain which is rated mild-moderate.  She denies passing a stone.  She has no voiding symptoms.  Denies fever, chills, nausea or vomiting.  Her pain is primarily located in the left lower abdomen near the anterior axillary line.  PMH: Past Medical History:  Diagnosis Date  . Bilateral nephrolithiasis 03/24/2018  . Essential hypertension, benign 04/04/2016  . Extrinsic asthma 08/15/2016  . 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 Hypertension   . Major depressive disorder, recurrent episode, moderate ([place]) 05/21/2013  . Obesity (BMI 30.0-34.9) 04/11/2017  . 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 Personal history of traumatic brain injury 58  . Shoulder impingement 2009   surgical relesase, Dr. Marry Guan    Surgical History: Past Surgical History:  Procedure Laterality Date  . EYE SURGERY  1995  . LEG SURGERY  1985  . SHOULDER SURGERY    . SUBACROMIAL DECOMPRESSION  2000   Right shoulder, [place]  . TONSILLECTOMY  2001    Home Medications:  Allergies as of 01/21/2020      Reactions   Zonegran [zonisamide] Rash      Medication List       Accurate as of January 21, 2020  4:00 PM. If you have any questions, ask your nurse or doctor.        albuterol 108 (90 Base) MCG/ACT inhaler Commonly known as: VENTOLIN HFA TAKE 2 PUFFS BY MOUTH EVERY 6 HOURS AS NEEDED FOR WHEEZE OR SHORTNESS OF BREATH   amLODipine 5 MG tablet Commonly known as: NORVASC TAKE 1 TABLET BY MOUTH EVERY DAY   Arnuity Ellipta 100 MCG/ACT Aepb Generic drug: Fluticasone Furoate INHALE 1 PUFF BY MOUTH EVERY DAY   baclofen 10 MG tablet Commonly known as: LIORESAL Take 10 mg by mouth 2 (two) times daily. 2 days a week   carbamazepine 300 MG 12 hr capsule Commonly known as: CARBATROL TAKE 1 CAPSULE BY ORAL ROUTE 3 TIMES A DAY FOR 30 DAYS   cetirizine 10 MG tablet Commonly known as: ZYRTEC Take 10 mg by mouth daily.   escitalopram 10 MG tablet Commonly known as: LEXAPRO TAKE 1 TABLET BY MOUTH EVERY DAY   Excedrin Tension Headache 500-65 MG Tabs Generic drug: Acetaminophen-Caffeine Take by mouth.   medroxyPROGESTERone Acetate 150 MG/ML Susy INJECT 1 ML (150 MG TOTAL) INTO THE MUSCLE EVERY 3 (THREE) MONTHS.   Melatonin 5 MG Caps Take by mouth.   meloxicam 15 MG tablet Commonly known as: MOBIC TAKE 1 TABLET BY MOUTH EVERY DAY   metoprolol succinate 100 MG 24  hr tablet Commonly known as: TOPROL-XL TAKE 1 TABLET (100 MG TOTAL) BY MOUTH DAILY. TAKE WITH OR IMMEDIATELY FOLLOWING A MEAL.   multivitamin, stress formula tablet Take 1 tablet by mouth daily.   oxyCODONE-acetaminophen 5-325 MG tablet Commonly known as: Percocet Take 1 tablet by mouth every 4 (four) hours as needed for severe pain.       Allergies:  Allergies  Allergen Reactions  . Zonegran [Zonisamide] Rash    Family History: Family History  Problem Relation Age of Onset  . Diabetes Mother   . Coronary artery disease Mother   . Hyperlipidemia Mother   . Hypertension Mother   . Parkinson's  disease Mother   . Heart disease Maternal Grandfather   . Healthy Father     Social History:  reports that she has never smoked. She has never used smokeless tobacco. She reports that she does not drink alcohol or use drugs.  ROS: UROLOGY Frequent Urination?: No Hard to postpone urination?: No Burning/pain with urination?: No Get up at night to urinate?: No Leakage of urine?: No Urine stream starts and stops?: No Trouble starting stream?: No Do you have to strain to urinate?: No Blood in urine?: No Urinary tract infection?: No Sexually transmitted disease?: No Injury to kidneys or bladder?: No Painful intercourse?: No Weak stream?: No Currently pregnant?: No Vaginal bleeding?: No Last menstrual period?: n  Gastrointestinal Nausea?: No Vomiting?: No Indigestion/heartburn?: No Diarrhea?: No Constipation?: No  Constitutional Fever: No Night sweats?: No Weight loss?: No Fatigue?: No  Skin Skin rash/lesions?: No Itching?: No  Eyes Blurred vision?: No Double vision?: Yes  Ears/Nose/Throat Sore throat?: No Sinus problems?: No  Hematologic/Lymphatic Swollen glands?: No Easy bruising?: No  Cardiovascular Leg swelling?: No Chest pain?: No  Respiratory Cough?: No Shortness of breath?: No  Endocrine Excessive thirst?: No  Musculoskeletal Back pain?: No Joint pain?: No  Neurological Headaches?: Yes Dizziness?: No  Psychologic Depression?: Yes Anxiety?: No  Physical Exam: BP (!) 137/91   Pulse 89   Ht 5\' 3"  (1.6 m)   Wt 201 lb (91.2 kg)   BMI 35.61 kg/m   Constitutional:  Alert and oriented, No acute distress. HEENT: [place] AT, moist mucus membranes.  Trachea midline, no masses. Cardiovascular: No clubbing, cyanosis, or edema.  RRR Respiratory: Normal respiratory effort, no increased work of breathing.  Clear GI: Abdomen is soft, nontender, nondistended, no abdominal masses GU: No CVA tenderness Skin: No rashes, bruises or suspicious  lesions. Neurologic: Grossly intact, no focal deficits, moving all 4 extremities. Psychiatric: Normal mood and affect.   Pertinent Imaging: CT was personally reviewed.  It measures 4 x 10 mm on coronal views.  Stone density 700-800 HU Results for orders placed during the hospital encounter of 01/14/20  CT Renal Stone Study   Narrative CLINICAL DATA:  Left-sided flank pain.  EXAM: CT ABDOMEN AND PELVIS WITHOUT CONTRAST  TECHNIQUE: Multidetector CT imaging of the abdomen and pelvis was performed following the standard protocol without IV contrast.  COMPARISON:  March 14, 2018  FINDINGS: Lower chest: The lung bases are clear. The heart size is normal.  Hepatobiliary: The liver is normal. Normal gallbladder.There is no biliary ductal dilation.  Pancreas: Normal contours without ductal dilatation. No peripancreatic fluid collection.  Spleen: No splenic laceration or hematoma.  Adrenals/Urinary Tract:  --Adrenal glands: No adrenal hemorrhage.  --Right kidney/ureter: No hydronephrosis or perinephric hematoma.  --Left kidney/ureter: There is moderate left-sided hydroureteronephrosis secondary to an obstructing set of stones in the distal left ureter measuring  up to approximately 5-6 mm (axial series 2, image 82). There is a punctate residual stone in the upper pole the left kidney (coronal series 5, image 86).  --Urinary bladder: The urinary bladder is decompressed and therefore is poorly evaluated.  Stomach/Bowel:  --Stomach/Duodenum: No hiatal hernia or other gastric abnormality. Normal duodenal course and caliber.  --Small bowel: No dilatation or inflammation.  --Colon: No focal abnormality.  --Appendix: Normal.  Vascular/Lymphatic: Normal course and caliber of the major abdominal vessels.  --No retroperitoneal lymphadenopathy.  --No mesenteric lymphadenopathy.  --No pelvic or inguinal lymphadenopathy.  Reproductive: Unremarkable  Other: No ascites or  free air. The abdominal wall is normal.  Musculoskeletal. No acute displaced fractures.  IMPRESSION: Moderate left-sided hydroureteronephrosis secondary to an obstructing set of stones in the distal left ureter measuring up to 5-6 mm.   Electronically Signed   By: Constance Holster M.D.   On: 01/14/2020 03:31     Assessment & Plan:    - Left ureteral calculus A KUB was obtained today and there is a teardrop shaped calcification on the left true bony pelvis and approximately the same location as the CT.  No significant distal progression in the past week.  We discussed options of a continued trial of passage, ureteroscopic stone removal and shockwave lithotripsy.  We discussed for a stone in this location ureteroscopy would have a higher success rate however does require general/regional anesthesia, is more invasive and she would need a stent postoperatively.  Shockwave lithotripsy would have a lower success rate though is noninvasive and can be performed under conscious sedation.  She would like to proceed with shockwave lithotripsy tomorrow.  The indications and nature of the planned procedure were discussed as well as the potential benefits and expected outcome.  Alternatives were discussed as described above.  The most common complications and side effects were discussed as outlined in the [facility] consent form.  It was stressed that there is no guarantee that lithotripsy will be successful and she could require retreatment or alternative treatment. The possibility of renal colic from obstructing stone fragments requiring stent placement or ureteroscopy was also discussed.    Abbie Sons, [place] [address], [place], [place] [address] [phone]

## 2020-01-21 NOTE — H&P (View-Only) (Signed)
01/21/2020 4:00 PM   [name] [birth year] [id number]  Referring provider: Crecencio Mc, MD [place],  [place] [address]  Chief Complaint  Patient presents with  . Nephrolithiasis    HPI: Sobia Norling is a 39 y.o. female who presented to the [facility] ED on 01/14/2020 with acute onset of left flank pain which she rated 10/10.  No identifiable precipitating, aggravating or alleviating factors.  Denied fever, chills, nausea or vomiting.  A stone protocol CT was performed which showed a 6 mm left distal ureteral calculus with moderate proximal hydronephrosis/hydroureter.  Her pain was controlled with parenteral analgesics and and it does not look like she was discharged on outpatient medication.  She saw Dr. Derrel Nip on 2/5 and was given Rx oxycodone.  She continues to have intermittent pain which is rated mild-moderate.  She denies passing a stone.  She has no voiding symptoms.  Denies fever, chills, nausea or vomiting.  Her pain is primarily located in the left lower abdomen near the anterior axillary line.  PMH: Past Medical History:  Diagnosis Date  . Bilateral nephrolithiasis 03/24/2018  . Essential hypertension, benign 04/04/2016  . Extrinsic asthma 08/15/2016  . 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 Hypertension   . Major depressive disorder, recurrent episode, moderate ([place]) 05/21/2013  . Obesity (BMI 30.0-34.9) 04/11/2017  . 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 Personal history of traumatic brain injury 53  . Shoulder impingement 2009   surgical relesase, Dr. Marry Guan    Surgical History: Past Surgical History:  Procedure Laterality Date  . EYE SURGERY  1995  . LEG SURGERY  1985  . SHOULDER SURGERY    . SUBACROMIAL DECOMPRESSION  2000   Right shoulder, [place]  . TONSILLECTOMY  2001    Home Medications:  Allergies as of 01/21/2020      Reactions   Zonegran [zonisamide] Rash      Medication List       Accurate as of January 21, 2020  4:00 PM. If you have any questions, ask your nurse or doctor.        albuterol 108 (90 Base) MCG/ACT inhaler Commonly known as: VENTOLIN HFA TAKE 2 PUFFS BY MOUTH EVERY 6 HOURS AS NEEDED FOR WHEEZE OR SHORTNESS OF BREATH   amLODipine 5 MG tablet Commonly known as: NORVASC TAKE 1 TABLET BY MOUTH EVERY DAY   Arnuity Ellipta 100 MCG/ACT Aepb Generic drug: Fluticasone Furoate INHALE 1 PUFF BY MOUTH EVERY DAY   baclofen 10 MG tablet Commonly known as: LIORESAL Take 10 mg by mouth 2 (two) times daily. 2 days a week   carbamazepine 300 MG 12 hr capsule Commonly known as: CARBATROL TAKE 1 CAPSULE BY ORAL ROUTE 3 TIMES A DAY FOR 30 DAYS   cetirizine 10 MG tablet Commonly known as: ZYRTEC Take 10 mg by mouth daily.   escitalopram 10 MG tablet Commonly known as: LEXAPRO TAKE 1 TABLET BY MOUTH EVERY DAY   Excedrin Tension Headache 500-65 MG Tabs Generic drug: Acetaminophen-Caffeine Take by mouth.   medroxyPROGESTERone Acetate 150 MG/ML Susy INJECT 1 ML (150 MG TOTAL) INTO THE MUSCLE EVERY 3 (THREE) MONTHS.   Melatonin 5 MG Caps Take by mouth.   meloxicam 15 MG tablet Commonly known as: MOBIC TAKE 1 TABLET BY MOUTH EVERY DAY   metoprolol succinate 100 MG 24  hr tablet Commonly known as: TOPROL-XL TAKE 1 TABLET (100 MG TOTAL) BY MOUTH DAILY. TAKE WITH OR IMMEDIATELY FOLLOWING A MEAL.   multivitamin, stress formula tablet Take 1 tablet by mouth daily.   oxyCODONE-acetaminophen 5-325 MG tablet Commonly known as: Percocet Take 1 tablet by mouth every 4 (four) hours as needed for severe pain.       Allergies:  Allergies  Allergen Reactions  . Zonegran [Zonisamide] Rash    Family History: Family History  Problem Relation Age of Onset  . Diabetes Mother   . Coronary artery disease Mother   . Hyperlipidemia Mother   . Hypertension Mother   . Parkinson's  disease Mother   . Heart disease Maternal Grandfather   . Healthy Father     Social History:  reports that she has never smoked. She has never used smokeless tobacco. She reports that she does not drink alcohol or use drugs.  ROS: UROLOGY Frequent Urination?: No Hard to postpone urination?: No Burning/pain with urination?: No Get up at night to urinate?: No Leakage of urine?: No Urine stream starts and stops?: No Trouble starting stream?: No Do you have to strain to urinate?: No Blood in urine?: No Urinary tract infection?: No Sexually transmitted disease?: No Injury to kidneys or bladder?: No Painful intercourse?: No Weak stream?: No Currently pregnant?: No Vaginal bleeding?: No Last menstrual period?: n  Gastrointestinal Nausea?: No Vomiting?: No Indigestion/heartburn?: No Diarrhea?: No Constipation?: No  Constitutional Fever: No Night sweats?: No Weight loss?: No Fatigue?: No  Skin Skin rash/lesions?: No Itching?: No  Eyes Blurred vision?: No Double vision?: Yes  Ears/Nose/Throat Sore throat?: No Sinus problems?: No  Hematologic/Lymphatic Swollen glands?: No Easy bruising?: No  Cardiovascular Leg swelling?: No Chest pain?: No  Respiratory Cough?: No Shortness of breath?: No  Endocrine Excessive thirst?: No  Musculoskeletal Back pain?: No Joint pain?: No  Neurological Headaches?: Yes Dizziness?: No  Psychologic Depression?: Yes Anxiety?: No  Physical Exam: BP (!) 137/91   Pulse 89   Ht 5\' 3"  (1.6 m)   Wt 201 lb (91.2 kg)   BMI 35.61 kg/m   Constitutional:  Alert and oriented, No acute distress. HEENT: [place] AT, moist mucus membranes.  Trachea midline, no masses. Cardiovascular: No clubbing, cyanosis, or edema.  RRR Respiratory: Normal respiratory effort, no increased work of breathing.  Clear GI: Abdomen is soft, nontender, nondistended, no abdominal masses GU: No CVA tenderness Skin: No rashes, bruises or suspicious  lesions. Neurologic: Grossly intact, no focal deficits, moving all 4 extremities. Psychiatric: Normal mood and affect.   Pertinent Imaging: CT was personally reviewed.  It measures 4 x 10 mm on coronal views.  Stone density 700-800 HU Results for orders placed during the hospital encounter of 01/14/20  CT Renal Stone Study   Narrative CLINICAL DATA:  Left-sided flank pain.  EXAM: CT ABDOMEN AND PELVIS WITHOUT CONTRAST  TECHNIQUE: Multidetector CT imaging of the abdomen and pelvis was performed following the standard protocol without IV contrast.  COMPARISON:  March 14, 2018  FINDINGS: Lower chest: The lung bases are clear. The heart size is normal.  Hepatobiliary: The liver is normal. Normal gallbladder.There is no biliary ductal dilation.  Pancreas: Normal contours without ductal dilatation. No peripancreatic fluid collection.  Spleen: No splenic laceration or hematoma.  Adrenals/Urinary Tract:  --Adrenal glands: No adrenal hemorrhage.  --Right kidney/ureter: No hydronephrosis or perinephric hematoma.  --Left kidney/ureter: There is moderate left-sided hydroureteronephrosis secondary to an obstructing set of stones in the distal left ureter measuring  up to approximately 5-6 mm (axial series 2, image 82). There is a punctate residual stone in the upper pole the left kidney (coronal series 5, image 86).  --Urinary bladder: The urinary bladder is decompressed and therefore is poorly evaluated.  Stomach/Bowel:  --Stomach/Duodenum: No hiatal hernia or other gastric abnormality. Normal duodenal course and caliber.  --Small bowel: No dilatation or inflammation.  --Colon: No focal abnormality.  --Appendix: Normal.  Vascular/Lymphatic: Normal course and caliber of the major abdominal vessels.  --No retroperitoneal lymphadenopathy.  --No mesenteric lymphadenopathy.  --No pelvic or inguinal lymphadenopathy.  Reproductive: Unremarkable  Other: No ascites or  free air. The abdominal wall is normal.  Musculoskeletal. No acute displaced fractures.  IMPRESSION: Moderate left-sided hydroureteronephrosis secondary to an obstructing set of stones in the distal left ureter measuring up to 5-6 mm.   Electronically Signed   By: Constance Holster M.D.   On: 01/14/2020 03:31     Assessment & Plan:    - Left ureteral calculus A KUB was obtained today and there is a teardrop shaped calcification on the left true bony pelvis and approximately the same location as the CT.  No significant distal progression in the past week.  We discussed options of a continued trial of passage, ureteroscopic stone removal and shockwave lithotripsy.  We discussed for a stone in this location ureteroscopy would have a higher success rate however does require general/regional anesthesia, is more invasive and she would need a stent postoperatively.  Shockwave lithotripsy would have a lower success rate though is noninvasive and can be performed under conscious sedation.  She would like to proceed with shockwave lithotripsy tomorrow.  The indications and nature of the planned procedure were discussed as well as the potential benefits and expected outcome.  Alternatives were discussed as described above.  The most common complications and side effects were discussed as outlined in the [facility] consent form.  It was stressed that there is no guarantee that lithotripsy will be successful and she could require retreatment or alternative treatment. The possibility of renal colic from obstructing stone fragments requiring stent placement or ureteroscopy was also discussed.    Abbie Sons, [place] [address], [place], [place] [address] [phone]

## 2020-01-22 ENCOUNTER — Encounter
Admission: RE | Disposition: A | Discharge status: Home / Self Care | Payer: Self-pay | Source: Home / Self Care | Attending: Urology

## 2020-01-22 ENCOUNTER — Other Ambulatory Visit: Payer: Self-pay

## 2020-01-22 ENCOUNTER — Telehealth: Payer: Self-pay

## 2020-01-22 ENCOUNTER — Encounter: Payer: Self-pay | Admitting: Urology

## 2020-01-22 ENCOUNTER — Ambulatory Visit
Admission: RE | Admit: 2020-01-22 | Discharge: 2020-01-22 | Disposition: A | Discharge status: Home / Self Care | Payer: Medicare Other | Attending: Urology | Admitting: Urology

## 2020-01-22 DIAGNOSIS — Z791 Long term (current) use of non-steroidal anti-inflammatories (NSAID): Secondary | ICD-10-CM | POA: Diagnosis not present

## 2020-01-22 DIAGNOSIS — N201 Calculus of ureter: Secondary | ICD-10-CM | POA: Diagnosis not present

## 2020-01-22 DIAGNOSIS — I10 Essential (primary) hypertension: Secondary | ICD-10-CM | POA: Insufficient documentation

## 2020-01-22 DIAGNOSIS — Z79899 Other long term (current) drug therapy: Secondary | ICD-10-CM | POA: Diagnosis not present

## 2020-01-22 DIAGNOSIS — J45909 Unspecified asthma, uncomplicated: Secondary | ICD-10-CM | POA: Insufficient documentation

## 2020-01-22 DIAGNOSIS — F339 Major depressive disorder, recurrent, unspecified: Secondary | ICD-10-CM | POA: Diagnosis not present

## 2020-01-22 DIAGNOSIS — Z7951 Long term (current) use of inhaled steroids: Secondary | ICD-10-CM | POA: Insufficient documentation

## 2020-01-22 DIAGNOSIS — N200 Calculus of kidney: Secondary | ICD-10-CM | POA: Diagnosis present

## 2020-01-22 HISTORY — PX: EXTRACORPOREAL SHOCK WAVE LITHOTRIPSY: SHX1557

## 2020-01-22 SURGERY — LITHOTRIPSY, ESWL
Anesthesia: Moderate Sedation | Laterality: Left

## 2020-01-22 MED ORDER — SODIUM CHLORIDE 0.9 % IV SOLN: INTRAVENOUS | Status: DC

## 2020-01-22 MED ORDER — ONDANSETRON HCL 4 MG/2ML IJ SOLN: 4.0000 mg | Freq: Once | INTRAMUSCULAR | Status: AC | PRN

## 2020-01-22 MED ORDER — CIPROFLOXACIN HCL 500 MG PO TABS
ORAL_TABLET | ORAL | Status: AC
  Administered 2020-01-22: 500 mg via ORAL
  Filled 2020-01-22: qty 1

## 2020-01-22 MED ORDER — CIPROFLOXACIN HCL 500 MG PO TABS: 500.0000 mg | ORAL_TABLET | ORAL | Status: AC

## 2020-01-22 MED ORDER — DIPHENHYDRAMINE HCL 25 MG PO CAPS: 25.0000 mg | ORAL_CAPSULE | ORAL | Status: AC

## 2020-01-22 MED ORDER — DIAZEPAM 5 MG PO TABS: 10.0000 mg | ORAL_TABLET | ORAL | Status: AC

## 2020-01-22 MED ORDER — DIAZEPAM 5 MG PO TABS
ORAL_TABLET | ORAL | Status: AC
  Administered 2020-01-22: 10 mg via ORAL
  Filled 2020-01-22: qty 2

## 2020-01-22 MED ORDER — ONDANSETRON HCL 4 MG/2ML IJ SOLN
INTRAMUSCULAR | Status: AC
  Administered 2020-01-22: 4 mg via INTRAVENOUS
  Filled 2020-01-22: qty 2

## 2020-01-22 MED ORDER — TAMSULOSIN HCL 0.4 MG PO CAPS: 0.4000 mg | ORAL_CAPSULE | Freq: Every day | ORAL | 0 refills | Status: DC

## 2020-01-22 MED ORDER — DIPHENHYDRAMINE HCL 25 MG PO CAPS
ORAL_CAPSULE | ORAL | Status: AC
  Administered 2020-01-22: 25 mg via ORAL
  Filled 2020-01-22: qty 1

## 2020-01-22 NOTE — Interval H&P Note (Signed)
History and Physical Interval Note:  01/22/2020 9:46 AM  Margaret Zuniga  has presented today for surgery, with the diagnosis of Kidney stone.  The various methods of treatment have been discussed with the patient and family. After consideration of risks, benefits and other options for treatment, the patient has consented to  Procedure(s): EXTRACORPOREAL SHOCK WAVE LITHOTRIPSY (ESWL) (Left) as a surgical intervention.  The patient's history has been reviewed, patient examined, no change in status, stable for surgery.  I have reviewed the patient's chart and labs.  Questions were answered to the patient's satisfaction.     [place]

## 2020-01-22 NOTE — Telephone Encounter (Signed)
Patient mother called stating she did not receive a strainer post op.  Mother is aware they can come to office to pick one up. Strainer up at check in.

## 2020-01-22 NOTE — Discharge Instructions (Signed)
AMBULATORY SURGERY  DISCHARGE INSTRUCTIONS   1) The drugs that you were given will stay in your system until tomorrow so for the next 24 hours you should not:  A) Drive an automobile B) Make any legal decisions C) Drink any alcoholic beverage   2) You may resume regular meals tomorrow.  Today it is better to start with liquids and gradually work up to solid foods.  You may eat anything you prefer, but it is better to start with liquids, then soup and crackers, and gradually work up to solid foods.   3) Please notify your doctor immediately if you have any unusual bleeding, trouble breathing, redness and pain at the surgery site, drainage, fever, or pain not relieved by medication.    4) Additional Instructions:        Please contact your physician with any problems or Same Day Surgery at [phone], Monday through Friday 6 am to 4 pm, or [place] at [facility] number at [phone].Per [employer] D/C instructions.  Rx tamsulosin sent to pharmacy.

## 2020-01-25 ENCOUNTER — Other Ambulatory Visit: Payer: Self-pay | Admitting: Internal Medicine

## 2020-01-26 ENCOUNTER — Other Ambulatory Visit: Payer: Self-pay | Admitting: Orthopedic Surgery

## 2020-01-26 DIAGNOSIS — M7121 Synovial cyst of popliteal space [Baker], right knee: Secondary | ICD-10-CM

## 2020-01-26 DIAGNOSIS — N200 Calculus of kidney: Secondary | ICD-10-CM | POA: Diagnosis not present

## 2020-01-28 DIAGNOSIS — M791 Myalgia, unspecified site: Secondary | ICD-10-CM | POA: Diagnosis not present

## 2020-01-28 DIAGNOSIS — M542 Cervicalgia: Secondary | ICD-10-CM | POA: Diagnosis not present

## 2020-01-28 DIAGNOSIS — G43719 Chronic migraine without aura, intractable, without status migrainosus: Secondary | ICD-10-CM | POA: Diagnosis not present

## 2020-02-04 ENCOUNTER — Ambulatory Visit
Admission: RE | Admit: 2020-02-04 | Discharge: 2020-02-04 | Disposition: A | Discharge status: Home / Self Care | Payer: Medicare Other | Source: Ambulatory Visit | Attending: Orthopedic Surgery | Admitting: Orthopedic Surgery

## 2020-02-04 ENCOUNTER — Other Ambulatory Visit: Payer: Self-pay

## 2020-02-04 DIAGNOSIS — M7121 Synovial cyst of popliteal space [Baker], right knee: Secondary | ICD-10-CM | POA: Insufficient documentation

## 2020-02-04 DIAGNOSIS — M25561 Pain in right knee: Secondary | ICD-10-CM | POA: Diagnosis not present

## 2020-02-05 ENCOUNTER — Ambulatory Visit
Admission: RE | Admit: 2020-02-05 | Discharge: 2020-02-05 | Disposition: A | Discharge status: Home / Self Care | Payer: Medicare Other | Source: Ambulatory Visit | Attending: Urology | Admitting: Urology

## 2020-02-05 ENCOUNTER — Encounter: Payer: Self-pay | Admitting: Urology

## 2020-02-05 ENCOUNTER — Ambulatory Visit (INDEPENDENT_AMBULATORY_CARE_PROVIDER_SITE_OTHER): Payer: Medicare Other | Admitting: Urology

## 2020-02-05 VITALS — BP 132/87 | HR 96 | Ht 62.0 in | Wt 200.0 lb

## 2020-02-05 DIAGNOSIS — N200 Calculus of kidney: Secondary | ICD-10-CM | POA: Diagnosis not present
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
## 2020-02-09 ENCOUNTER — Other Ambulatory Visit: Payer: Self-pay | Admitting: Urology

## 2020-02-09 ENCOUNTER — Other Ambulatory Visit: Payer: Self-pay

## 2020-02-09 ENCOUNTER — Ambulatory Visit (INDEPENDENT_AMBULATORY_CARE_PROVIDER_SITE_OTHER): Payer: Medicare Other | Admitting: Obstetrics and Gynecology

## 2020-02-09 VITALS — BP 138/83 | HR 84 | Ht 62.0 in | Wt 207.0 lb

## 2020-02-09 DIAGNOSIS — Z3042 Encounter for surveillance of injectable contraceptive: Secondary | ICD-10-CM | POA: Diagnosis not present

## 2020-02-09 MED ORDER — MEDROXYPROGESTERONE ACETATE 150 MG/ML IM SUSP
150.0000 mg | Freq: Once | INTRAMUSCULAR | Status: AC
  Administered 2020-02-09: 150 mg via INTRAMUSCULAR

## 2020-02-09 NOTE — Progress Notes (Signed)
Date last pap 11/05/2018. Last Depo-Provera: 11/16/2020. Side Effects if any: none. Serum HCG indicated? N/A. Depo-Provera 150 mg IM given by: F.Lennin Osmond, LPN Next appointment due May 17-31, 2021.   BP 138/83   Pulse 84   Ht 5\' 2"  (1.575 m)   Wt 207 lb (93.9 kg)   BMI 37.86 kg/m

## 2020-02-10 ENCOUNTER — Telehealth: Payer: Self-pay | Admitting: Urology

## 2020-02-10 NOTE — Telephone Encounter (Signed)
Stone analysis was mixed calcium, primarily calcium oxalate which is the most common type of stone.  Stone prevention measures were discussed at office visit last week

## 2020-02-11 DIAGNOSIS — S83271D Complex tear of lateral meniscus, current injury, right knee, subsequent encounter: Secondary | ICD-10-CM | POA: Diagnosis not present

## 2020-02-11 DIAGNOSIS — M7121 Synovial cyst of popliteal space [Baker], right knee: Secondary | ICD-10-CM | POA: Diagnosis not present

## 2020-02-11 DIAGNOSIS — Z3042 Encounter for surveillance of injectable contraceptive: Secondary | ICD-10-CM | POA: Insufficient documentation

## 2020-02-11 NOTE — Telephone Encounter (Signed)
Patient notified

## 2020-02-18 ENCOUNTER — Other Ambulatory Visit: Payer: Self-pay | Admitting: Orthopedic Surgery

## 2020-02-20 ENCOUNTER — Other Ambulatory Visit: Payer: Self-pay | Admitting: Internal Medicine

## 2020-02-20 DIAGNOSIS — R0600 Dyspnea, unspecified: Secondary | ICD-10-CM | POA: Diagnosis not present

## 2020-02-20 DIAGNOSIS — J45909 Unspecified asthma, uncomplicated: Secondary | ICD-10-CM | POA: Diagnosis not present

## 2020-02-25 ENCOUNTER — Ambulatory Visit
Admission: RE | Admit: 2020-02-25 | Discharge: 2020-02-25 | Disposition: A | Discharge status: Home / Self Care | Payer: Medicare Other | Source: Ambulatory Visit | Attending: Internal Medicine | Admitting: Internal Medicine

## 2020-02-25 ENCOUNTER — Ambulatory Visit
Admission: RE | Admit: 2020-02-25 | Discharge: 2020-02-25 | Disposition: A | Discharge status: Home / Self Care | Payer: Medicare Other | Source: Ambulatory Visit

## 2020-02-25 DIAGNOSIS — R0600 Dyspnea, unspecified: Secondary | ICD-10-CM | POA: Diagnosis not present

## 2020-02-25 DIAGNOSIS — J45909 Unspecified asthma, uncomplicated: Secondary | ICD-10-CM | POA: Insufficient documentation

## 2020-02-26 ENCOUNTER — Other Ambulatory Visit: Payer: Self-pay

## 2020-02-26 ENCOUNTER — Encounter
Admission: RE | Admit: 2020-02-26 | Discharge: 2020-02-26 | Disposition: A | Discharge status: Home / Self Care | Payer: Medicare Other | Source: Ambulatory Visit | Attending: Orthopedic Surgery | Admitting: Orthopedic Surgery

## 2020-02-26 HISTORY — DX: Personal history of urinary calculi: Z87.442

## 2020-02-26 NOTE — Patient Instructions (Signed)
Your procedure is scheduled on: March 04, 2020 THURSDAY Report to Day Surgery on the 2nd floor of the [facility]. To find out your arrival time, please call [phone] between 1PM - 3PM on: Wednesday March 03, 2020  REMEMBER: Instructions that are not followed completely may result in serious medical risk, up to and including death; or upon the discretion of your surgeon and anesthesiologist your surgery may need to be rescheduled.  Do not eat food after midnight the night before surgery.  No gum chewing, lozengers or hard candies.  You may however, drink CLEAR liquids up to 2 hours before you are scheduled to arrive for your surgery. Do not drink anything within 2 hours of the start of your surgery.  Clear liquids include: - water  - apple juice without pulp - gatorade (not RED) - black coffee or tea (Do NOT add milk or creamers to the coffee or tea) Do NOT drink anything that is not on this list.  Type 1 and Type 2 diabetics should only drink water.  ENSURE PRE-SURGERY CARBOHYDRATE DRINK:  Complete drinking 2 hours prior to scheduled arrival time.  TAKE THESE MEDICATIONS THE MORNING OF SURGERY WITH A SIP OF WATER: AMLODIPINE CARBATROL ESCITALOPRAM  Use inhalers on the day of surgery  Follow recommendations from Cardiologist, Pulmonologist or PCP regarding stopping Aspirin, Coumadin, Plavix, Eliquis, Pradaxa, or Pletal.  Stop Anti-inflammatories (NSAIDS) such as Advil, Aleve, Ibuprofen, Motrin, Naproxen, Naprosyn and Aspirin based products such as Excedrin, Goodys Powder, BC Powder MELOXICAM (May take Tylenol or Acetaminophen if needed.)  Stop ANY OVER THE COUNTER supplements until after surgery. (May continue Vitamin D, Vitamin B, and multivitamin.)  No Alcohol for 24 hours before or after surgery.  No Smoking including e-cigarettes for 24 hours prior to surgery.  No chewable tobacco products for at least 6 hours prior to surgery.  No nicotine patches on the day of  surgery.  On the morning of surgery brush your teeth with toothpaste and water, you may rinse your mouth with mouthwash if you wish. Do not swallow any toothpaste or mouthwash.  Do not wear jewelry, make-up, hairpins, clips or nail polish.  Do not wear lotions, powders, or perfumes.   Do not shave 48 hours prior to surgery.   Contact lenses, hearing aids and dentures may not be worn into surgery.  Do not bring valuables to the hospital, including drivers license, insurance or credit cards.  [facility] is not responsible for any belongings or valuables.   Use CHG Soap as directed on instruction sheet.  Notify your doctor if there is any change in your medical condition (cold, fever, infection).  Wear comfortable clothing (specific to your surgery type) to the hospital.  Plan for stool softeners for home use.  If you are being discharged the day of surgery, you will not be allowed to drive home. You will need a responsible adult to drive you home and stay with you that night.   If you are taking public transportation, you will need to have a responsible adult with you. Please confirm with your physician that it is acceptable to use public transportation.   Please call [phone] if you have any questions about these instructions.  Visitation Policy:  Patients undergoing a surgery or procedure in a hospital may have one family member or support person with them as long as that person is not COVID-19 positive or experiencing its symptoms. That person may remain in the waiting area during the procedure.  Should the patient need to stay at the hospital during part of their recovery, the support person may visit during visiting hours; 10 am to 8 pm.  Children under 73 years of age may have both parents or legal guardians with them during their hospital stay.

## 2020-03-01 ENCOUNTER — Ambulatory Visit
Admission: RE | Admit: 2020-03-01 | Discharge: 2020-03-01 | Disposition: A | Discharge status: Home / Self Care | Payer: Medicare Other | Source: Ambulatory Visit | Attending: Urology | Admitting: Urology

## 2020-03-01 ENCOUNTER — Other Ambulatory Visit
Admission: RE | Admit: 2020-03-01 | Discharge: 2020-03-01 | Disposition: A | Discharge status: Home / Self Care | Payer: Medicare Other | Source: Ambulatory Visit | Attending: Orthopedic Surgery | Admitting: Orthopedic Surgery

## 2020-03-01 ENCOUNTER — Other Ambulatory Visit: Payer: Self-pay

## 2020-03-01 DIAGNOSIS — N200 Calculus of kidney: Secondary | ICD-10-CM

## 2020-03-01 DIAGNOSIS — I10 Essential (primary) hypertension: Secondary | ICD-10-CM | POA: Insufficient documentation

## 2020-03-01 DIAGNOSIS — N201 Calculus of ureter: Secondary | ICD-10-CM | POA: Diagnosis not present

## 2020-03-01 DIAGNOSIS — Z01810 Encounter for preprocedural cardiovascular examination: Secondary | ICD-10-CM | POA: Diagnosis not present

## 2020-03-01 DIAGNOSIS — M7121 Synovial cyst of popliteal space [Baker], right knee: Secondary | ICD-10-CM | POA: Diagnosis not present

## 2020-03-01 DIAGNOSIS — X58XXXD Exposure to other specified factors, subsequent encounter: Secondary | ICD-10-CM | POA: Diagnosis not present

## 2020-03-01 DIAGNOSIS — S83271D Complex tear of lateral meniscus, current injury, right knee, subsequent encounter: Secondary | ICD-10-CM | POA: Diagnosis not present

## 2020-03-01 DIAGNOSIS — Z87442 Personal history of urinary calculi: Secondary | ICD-10-CM | POA: Diagnosis not present

## 2020-03-01 DIAGNOSIS — Z79899 Other long term (current) drug therapy: Secondary | ICD-10-CM | POA: Diagnosis not present

## 2020-03-01 DIAGNOSIS — F329 Major depressive disorder, single episode, unspecified: Secondary | ICD-10-CM | POA: Diagnosis not present

## 2020-03-01 DIAGNOSIS — N281 Cyst of kidney, acquired: Secondary | ICD-10-CM | POA: Diagnosis not present

## 2020-03-01 DIAGNOSIS — Z01812 Encounter for preprocedural laboratory examination: Secondary | ICD-10-CM | POA: Insufficient documentation

## 2020-03-01 DIAGNOSIS — Z20822 Contact with and (suspected) exposure to covid-19: Secondary | ICD-10-CM | POA: Insufficient documentation

## 2020-03-02 ENCOUNTER — Other Ambulatory Visit
Admission: RE | Admit: 2020-03-02 | Discharge: 2020-03-02 | Disposition: A | Discharge status: Home / Self Care | Payer: Medicare Other | Source: Ambulatory Visit | Attending: Orthopedic Surgery | Admitting: Orthopedic Surgery

## 2020-03-02 DIAGNOSIS — Z87442 Personal history of urinary calculi: Secondary | ICD-10-CM | POA: Diagnosis not present

## 2020-03-02 DIAGNOSIS — S83271D Complex tear of lateral meniscus, current injury, right knee, subsequent encounter: Secondary | ICD-10-CM | POA: Diagnosis not present

## 2020-03-02 DIAGNOSIS — Z20822 Contact with and (suspected) exposure to covid-19: Secondary | ICD-10-CM | POA: Diagnosis not present

## 2020-03-02 DIAGNOSIS — M7121 Synovial cyst of popliteal space [Baker], right knee: Secondary | ICD-10-CM | POA: Diagnosis not present

## 2020-03-02 DIAGNOSIS — Z79899 Other long term (current) drug therapy: Secondary | ICD-10-CM | POA: Diagnosis not present

## 2020-03-02 DIAGNOSIS — N281 Cyst of kidney, acquired: Secondary | ICD-10-CM | POA: Diagnosis not present

## 2020-03-02 DIAGNOSIS — I10 Essential (primary) hypertension: Secondary | ICD-10-CM | POA: Diagnosis not present

## 2020-03-02 LAB — SARS CORONAVIRUS 2 (TAT 6-24 HRS): SARS Coronavirus 2: NEGATIVE

## 2020-03-04 ENCOUNTER — Ambulatory Visit: Payer: Medicare Other

## 2020-03-04 ENCOUNTER — Encounter
Admission: RE | Disposition: A | Discharge status: Home / Self Care | Payer: Self-pay | Source: Home / Self Care | Attending: Orthopedic Surgery

## 2020-03-04 ENCOUNTER — Other Ambulatory Visit: Payer: Self-pay

## 2020-03-04 ENCOUNTER — Encounter: Payer: Self-pay | Admitting: Orthopedic Surgery

## 2020-03-04 ENCOUNTER — Ambulatory Visit
Admission: RE | Admit: 2020-03-04 | Discharge: 2020-03-04 | Disposition: A | Discharge status: Home / Self Care | Payer: Medicare Other | Attending: Orthopedic Surgery | Admitting: Orthopedic Surgery

## 2020-03-04 DIAGNOSIS — Z79899 Other long term (current) drug therapy: Secondary | ICD-10-CM | POA: Diagnosis not present

## 2020-03-04 DIAGNOSIS — S83271A Complex tear of lateral meniscus, current injury, right knee, initial encounter: Secondary | ICD-10-CM | POA: Diagnosis not present

## 2020-03-04 DIAGNOSIS — Z87442 Personal history of urinary calculi: Secondary | ICD-10-CM | POA: Insufficient documentation

## 2020-03-04 DIAGNOSIS — N281 Cyst of kidney, acquired: Secondary | ICD-10-CM | POA: Insufficient documentation

## 2020-03-04 DIAGNOSIS — F329 Major depressive disorder, single episode, unspecified: Secondary | ICD-10-CM | POA: Insufficient documentation

## 2020-03-04 DIAGNOSIS — X58XXXD Exposure to other specified factors, subsequent encounter: Secondary | ICD-10-CM | POA: Insufficient documentation

## 2020-03-04 DIAGNOSIS — Z20822 Contact with and (suspected) exposure to covid-19: Secondary | ICD-10-CM | POA: Insufficient documentation

## 2020-03-04 DIAGNOSIS — S83271D Complex tear of lateral meniscus, current injury, right knee, subsequent encounter: Secondary | ICD-10-CM | POA: Diagnosis not present

## 2020-03-04 DIAGNOSIS — M7121 Synovial cyst of popliteal space [Baker], right knee: Secondary | ICD-10-CM | POA: Insufficient documentation

## 2020-03-04 DIAGNOSIS — I10 Essential (primary) hypertension: Secondary | ICD-10-CM | POA: Insufficient documentation

## 2020-03-04 HISTORY — PX: KNEE ARTHROSCOPY WITH LATERAL MENISECTOMY: SHX6193

## 2020-03-04 LAB — POCT PREGNANCY, URINE: Preg Test, Ur: NEGATIVE

## 2020-03-04 SURGERY — ARTHROSCOPY, KNEE, WITH LATERAL MENISCECTOMY
Anesthesia: General | Site: Knee | Laterality: Right

## 2020-03-04 MED ORDER — CEFAZOLIN SODIUM-DEXTROSE 2-4 GM/100ML-% IV SOLN
2.0000 g | INTRAVENOUS | Status: AC
  Administered 2020-03-04: 2 g via INTRAVENOUS

## 2020-03-04 MED ORDER — FENTANYL CITRATE (PF) 100 MCG/2ML IJ SOLN
25.0000 ug | INTRAMUSCULAR | Status: DC | PRN
  Administered 2020-03-04: 25 ug via INTRAVENOUS

## 2020-03-04 MED ORDER — BUPIVACAINE-EPINEPHRINE (PF) 0.5% -1:200000 IJ SOLN
INTRAMUSCULAR | Status: DC | PRN
  Administered 2020-03-04: 20 mL via PERINEURAL

## 2020-03-04 MED ORDER — METOCLOPRAMIDE HCL 5 MG/ML IJ SOLN: 5.0000 mg | Freq: Three times a day (TID) | INTRAMUSCULAR | Status: DC | PRN

## 2020-03-04 MED ORDER — ONDANSETRON HCL 4 MG PO TABS: 4.0000 mg | ORAL_TABLET | Freq: Four times a day (QID) | ORAL | Status: DC | PRN

## 2020-03-04 MED ORDER — DEXAMETHASONE SODIUM PHOSPHATE 10 MG/ML IJ SOLN
INTRAMUSCULAR | Status: DC | PRN
  Administered 2020-03-04: 8 mg via INTRAVENOUS

## 2020-03-04 MED ORDER — FAMOTIDINE 20 MG PO TABS
ORAL_TABLET | ORAL | Status: AC
  Administered 2020-03-04: 20 mg via ORAL
  Filled 2020-03-04: qty 1

## 2020-03-04 MED ORDER — PHENYLEPHRINE HCL (PRESSORS) 10 MG/ML IV SOLN
INTRAVENOUS | Status: DC | PRN
  Administered 2020-03-04: 100 ug via INTRAVENOUS
  Administered 2020-03-04: 25 ug via INTRAVENOUS
  Administered 2020-03-04: 50 ug via INTRAVENOUS

## 2020-03-04 MED ORDER — PROPOFOL 10 MG/ML IV BOLUS
INTRAVENOUS | Status: AC
  Filled 2020-03-04: qty 40

## 2020-03-04 MED ORDER — FENTANYL CITRATE (PF) 100 MCG/2ML IJ SOLN
INTRAMUSCULAR | Status: AC
  Administered 2020-03-04: 25 ug via INTRAVENOUS
  Filled 2020-03-04: qty 2

## 2020-03-04 MED ORDER — FENTANYL CITRATE (PF) 100 MCG/2ML IJ SOLN
INTRAMUSCULAR | Status: AC
  Filled 2020-03-04: qty 2

## 2020-03-04 MED ORDER — CHLORHEXIDINE GLUCONATE 4 % EX LIQD
60.0000 mL | Freq: Once | CUTANEOUS | Status: AC
  Administered 2020-03-04: 4 via TOPICAL

## 2020-03-04 MED ORDER — GLYCOPYRROLATE 0.2 MG/ML IJ SOLN
INTRAMUSCULAR | Status: AC
  Filled 2020-03-04: qty 1

## 2020-03-04 MED ORDER — ONDANSETRON HCL 4 MG/2ML IJ SOLN: 4.0000 mg | Freq: Once | INTRAMUSCULAR | Status: DC | PRN

## 2020-03-04 MED ORDER — GLYCOPYRROLATE 0.2 MG/ML IJ SOLN
INTRAMUSCULAR | Status: DC | PRN
  Administered 2020-03-04: .2 mg via INTRAVENOUS

## 2020-03-04 MED ORDER — LIDOCAINE HCL (PF) 2 % IJ SOLN
INTRAMUSCULAR | Status: AC
  Filled 2020-03-04: qty 10

## 2020-03-04 MED ORDER — ONDANSETRON HCL 4 MG/2ML IJ SOLN
INTRAMUSCULAR | Status: DC | PRN
  Administered 2020-03-04: 4 mg via INTRAVENOUS

## 2020-03-04 MED ORDER — MIDAZOLAM HCL 5 MG/5ML IJ SOLN
INTRAMUSCULAR | Status: DC | PRN
  Administered 2020-03-04 (×2): 1 mg via INTRAVENOUS

## 2020-03-04 MED ORDER — FENTANYL CITRATE (PF) 100 MCG/2ML IJ SOLN
INTRAMUSCULAR | Status: DC | PRN
  Administered 2020-03-04 (×2): 50 ug via INTRAVENOUS

## 2020-03-04 MED ORDER — CEFAZOLIN SODIUM-DEXTROSE 2-4 GM/100ML-% IV SOLN
INTRAVENOUS | Status: AC
  Filled 2020-03-04: qty 100

## 2020-03-04 MED ORDER — LIDOCAINE HCL (PF) 2 % IJ SOLN
INTRAMUSCULAR | Status: DC | PRN
  Administered 2020-03-04: 100 mg

## 2020-03-04 MED ORDER — METOCLOPRAMIDE HCL 10 MG PO TABS: 5.0000 mg | ORAL_TABLET | Freq: Three times a day (TID) | ORAL | Status: DC | PRN

## 2020-03-04 MED ORDER — FAMOTIDINE 20 MG PO TABS: 20.0000 mg | ORAL_TABLET | Freq: Once | ORAL | Status: AC

## 2020-03-04 MED ORDER — LACTATED RINGERS IV SOLN: INTRAVENOUS | Status: DC

## 2020-03-04 MED ORDER — OXYCODONE-ACETAMINOPHEN 5-325 MG PO TABS: 1.0000 | ORAL_TABLET | Freq: Three times a day (TID) | ORAL | 0 refills | Status: DC | PRN

## 2020-03-04 MED ORDER — MIDAZOLAM HCL 2 MG/2ML IJ SOLN
INTRAMUSCULAR | Status: AC
  Filled 2020-03-04: qty 2

## 2020-03-04 MED ORDER — SODIUM CHLORIDE 0.9 % IV SOLN: INTRAVENOUS | Status: DC

## 2020-03-04 MED ORDER — ONDANSETRON HCL 4 MG/2ML IJ SOLN: 4.0000 mg | Freq: Four times a day (QID) | INTRAMUSCULAR | Status: DC | PRN

## 2020-03-04 MED ORDER — DEXMEDETOMIDINE HCL 200 MCG/2ML IV SOLN
INTRAVENOUS | Status: DC | PRN
  Administered 2020-03-04 (×2): 4 ug via INTRAVENOUS

## 2020-03-04 MED ORDER — PROPOFOL 10 MG/ML IV BOLUS
INTRAVENOUS | Status: DC | PRN
  Administered 2020-03-04: 50 mg via INTRAVENOUS
  Administered 2020-03-04: 150 mg via INTRAVENOUS

## 2020-03-04 SURGICAL SUPPLY — 27 items
BLADE INCISOR PLUS 4.5 (BLADE) IMPLANT
BNDG ELASTIC 4X5.8 VLCR STR LF (GAUZE/BANDAGES/DRESSINGS) ×2 IMPLANT
CHLORAPREP W/TINT 26 (MISCELLANEOUS) ×2 IMPLANT
COVER WAND RF STERILE (DRAPES) ×2 IMPLANT
CUFF TOURN SGL QUICK 30 (TOURNIQUET CUFF) ×1
CUFF TRNQT CYL 30X4X21-28X (TOURNIQUET CUFF) IMPLANT
GAUZE SPONGE 4X4 12PLY STRL (GAUZE/BANDAGES/DRESSINGS) ×2 IMPLANT
GLOVE SURG SYN 9.0  PF PI (GLOVE) ×1
GLOVE SURG SYN 9.0 PF PI (GLOVE) ×1 IMPLANT
GOWN SRG 2XL LVL 4 RGLN SLV (GOWNS) ×1 IMPLANT
GOWN STRL NON-REIN 2XL LVL4 (GOWNS) ×1
GOWN STRL REUS W/ TWL LRG LVL3 (GOWN DISPOSABLE) ×2 IMPLANT
GOWN STRL REUS W/TWL LRG LVL3 (GOWN DISPOSABLE) ×2
IV LACTATED RINGER IRRG 3000ML (IV SOLUTION) ×3
IV LR IRRIG 3000ML ARTHROMATIC (IV SOLUTION) ×2 IMPLANT
KIT TURNOVER KIT A (KITS) ×2 IMPLANT
MANIFOLD NEPTUNE II (INSTRUMENTS) ×2 IMPLANT
NEEDLE HYPO 22GX1.5 SAFETY (NEEDLE) ×2 IMPLANT
PACK ARTHROSCOPY KNEE (MISCELLANEOUS) ×2 IMPLANT
SCALPEL PROTECTED #11 DISP (BLADE) ×2 IMPLANT
SET TUBE SUCT SHAVER OUTFL 24K (TUBING) ×2 IMPLANT
SET TUBE TIP INTRA-ARTICULAR (MISCELLANEOUS) ×2 IMPLANT
SUT ETHILON 4-0 (SUTURE) ×1
SUT ETHILON 4-0 FS2 18XMFL BLK (SUTURE) ×1
SUTURE ETHLN 4-0 FS2 18XMF BLK (SUTURE) ×1 IMPLANT
TUBING ARTHRO INFLOW-ONLY STRL (TUBING) ×2 IMPLANT
WAND COBLATION FLOW 50 (SURGICAL WAND) ×2 IMPLANT

## 2020-03-04 NOTE — Transfer of Care (Signed)
Immediate Anesthesia Transfer of Care Note  Patient: JAISA MILLE  Procedure(s) Performed: KNEE ARTHROSCOPY WITH PARTIAL LATERAL MENISECTOMY (Right Knee)  Patient Location: PACU  Anesthesia Type:General  Level of Consciousness: sedated  Airway & Oxygen Therapy: Patient Spontanous Breathing and Patient connected to face mask oxygen  Post-op Assessment: Report given to RN and Post -op Vital signs reviewed and stable  Post vital signs: Reviewed  Last Vitals:  Vitals Value Taken Time  BP 113/67 03/04/20 0804  Temp    Pulse 74 03/04/20 0803  Resp 14 03/04/20 0803  SpO2 99 % 03/04/20 0803  Vitals shown include unvalidated device data.  Last Pain:  Vitals:   03/04/20 0626  TempSrc: Temporal  PainSc: 8          Complications: No apparent anesthesia complications

## 2020-03-04 NOTE — Anesthesia Postprocedure Evaluation (Signed)
Anesthesia Post Note  Patient: Margaret Zuniga  Procedure(s) Performed: KNEE ARTHROSCOPY WITH PARTIAL LATERAL MENISECTOMY (Right Knee)  Patient location during evaluation: PACU Anesthesia Type: General Level of consciousness: awake and alert Pain management: pain level controlled Vital Signs Assessment: post-procedure vital signs reviewed and stable Respiratory status: spontaneous breathing and respiratory function stable Cardiovascular status: stable Anesthetic complications: no     Last Vitals:  Vitals:   03/04/20 0844 03/04/20 0852  BP: 121/88 132/79  Pulse: 76 71  Resp: 17 18  Temp:  36.4 C  SpO2: 93% 95%    Last Pain:  Vitals:   03/04/20 0852  TempSrc: Temporal  PainSc: 3                  Janeshia Ciliberto K

## 2020-03-04 NOTE — OR Nursing (Signed)
Pt unable to urinate, states she is unable to urinate well on command. This RN opened up IVF, spoke with Dr. Ronelle Nigh and verbal orders for in and out

## 2020-03-04 NOTE — Discharge Instructions (Addendum)
Try to minimize activities through the weekend.  Leave bandage in place keep clean and dry. Pain medicine as directed. Aspirin 325 mg daily until walking normally. Call office if you are having problems. If bandage slides down the leg remove entire bandage, put Band-Aids over the 2 incisions and reapply Ace wrap only.   AMBULATORY SURGERY  DISCHARGE INSTRUCTIONS   1) The drugs that you were given will stay in your system until tomorrow so for the next 24 hours you should not:  A) Drive an automobile B) Make any legal decisions C) Drink any alcoholic beverage   2) You may resume regular meals tomorrow.  Today it is better to start with liquids and gradually work up to solid foods.  You may eat anything you prefer, but it is better to start with liquids, then soup and crackers, and gradually work up to solid foods.   3) Please notify your doctor immediately if you have any unusual bleeding, trouble breathing, redness and pain at the surgery site, drainage, fever, or pain not relieved by medication.    4) Additional Instructions:        Please contact your physician with any problems or Same Day Surgery at [phone], Monday through Friday 6 am to 4 pm, or [place] [facility] number at [phone].

## 2020-03-04 NOTE — Anesthesia Procedure Notes (Signed)
Procedure Name: LMA Insertion Date/Time: 03/04/2020 7:21 AM Performed by: Rolla Plate, CRNA Pre-anesthesia Checklist: Patient identified, Patient being monitored, Timeout performed, Emergency Drugs available and Suction available Patient Re-evaluated:Patient Re-evaluated prior to induction Oxygen Delivery Method: Circle system utilized Preoxygenation: Pre-oxygenation with 100% oxygen Induction Type: IV induction Ventilation: Mask ventilation without difficulty LMA: LMA inserted LMA Size: 3.5 Tube type: Oral Number of attempts: 1 Placement Confirmation: positive ETCO2 and breath sounds checked- equal and bilateral Tube secured with: Tape Dental Injury: Teeth and Oropharynx as per pre-operative assessment
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
## 2020-03-04 NOTE — Op Note (Signed)
03/04/2020  8:00 AM  PATIENT:  Margaret Zuniga  39 y.o. female  PRE-OPERATIVE DIAGNOSIS:  Baker's cyst of knee, right Complex tear of lateral meniscus of right knee  POST-OPERATIVE DIAGNOSIS:  Baker's cyst of knee, right Complex tear of lateral meniscus of right knee  PROCEDURE:  Procedure(s): KNEE ARTHROSCOPY WITH PARTIAL LATERAL MENISECTOMY (Right)  SURGEON: Laurene Footman, MD  ASSISTANTS: None  ANESTHESIA:   general  EBL:  Total I/O In: 600 [I.V.:600] Out: 5 [Blood:5]  BLOOD ADMINISTERED:none  DRAINS: none   LOCAL MEDICATIONS USED:  MARCAINE     SPECIMEN:  No Specimen  DISPOSITION OF SPECIMEN:  N/A  COUNTS:  YES  TOURNIQUET:  * Missing tourniquet times found for documented tourniquets in log: [id number] *  IMPLANTS: None  DICTATION: .Dragon Dictation patient was brought to the operating room and after adequate anesthesia was obtained the right leg was placed in the arthroscopic leg holder with a tourniquet applied but not required.  After prepping and draping in the usual sterile fashion appropriate patient identification and timeout procedures were completed.  An inferior lateral portal was made and the arthroscope introduced revealing moderate patellofemoral degenerative change on both sides of the joint no significant plica band suprapatellar pouch had minimal synovitis.  Coming down the medial compartment and inferior medial portal was made and on probing the medial meniscus was intact there was some synovitis over the anterior horn of the meniscus and an area of pain but this was ablated subsequently with the ablation device and there is no tear noted ACL was intact.  Going the lateral compartment there the anterior horn tear of the lateral meniscus was addressed with the ArthroCare wand there was a few areas of fibrillation of the articular cartilage in the lateral compartment but the articular cartilage was essentially normal.  After taking care of the lateral meniscus tear  there was some fat pad that would appear to be impinging in the anterior medial joint and was ablated the gutters were free of any loose bodies after thorough irrigation of the joint all instrumentation was withdrawn.  The portals were infiltrated with 10 cc each half percent Sensorcaine with epi followed by closure with simple interrupted 4-0 nylon skin sutures.  Xeroform 4 x 4 web roll and Ace wrap applied  PLAN OF CARE: Discharge to home after PACU  PATIENT DISPOSITION:  PACU - hemodynamically stable.

## 2020-03-05 ENCOUNTER — Telehealth: Payer: Self-pay

## 2020-03-05 ENCOUNTER — Other Ambulatory Visit: Payer: Self-pay | Admitting: Internal Medicine

## 2020-03-05 NOTE — Telephone Encounter (Signed)
mychart notification sent 

## 2020-03-05 NOTE — Telephone Encounter (Signed)
-----   Message from Abbie Sons, MD sent at 03/05/2020  8:00 AM EDT ----- Renal ultrasound showed no evidence of stone or kidney blockage.  Follow-up as scheduled

## 2020-03-10 DIAGNOSIS — M542 Cervicalgia: Secondary | ICD-10-CM | POA: Diagnosis not present

## 2020-03-10 DIAGNOSIS — M791 Myalgia, unspecified site: Secondary | ICD-10-CM | POA: Diagnosis not present

## 2020-03-10 DIAGNOSIS — G43719 Chronic migraine without aura, intractable, without status migrainosus: Secondary | ICD-10-CM | POA: Diagnosis not present

## 2020-03-15 ENCOUNTER — Ambulatory Visit: Payer: Medicare Other | Attending: Internal Medicine

## 2020-03-15 DIAGNOSIS — Z23 Encounter for immunization: Secondary | ICD-10-CM

## 2020-03-15 NOTE — Progress Notes (Signed)
   Covid-19 Vaccination Clinic  Name:  Margaret Zuniga    MRN: [id number] DOB: [birth year]  03/15/2020  [name] was observed post Covid-19 immunization for 15 minutes without incident. She was provided with Vaccine Information Sheet and instruction to access the V-Safe system.   [name] was instructed to call 911 with any severe reactions post vaccine: Marland Kitchen Difficulty breathing  . Swelling of face and throat  . A fast heartbeat  . A bad rash all over body  . Dizziness and weakness   Immunizations Administered    Name Date Dose VIS Date Route   Pfizer COVID-19 Vaccine 03/15/2020  2:52 PM 0.3 mL 11/21/2019 Intramuscular   Manufacturer: [place]   Lot: [id number]   [place]: [id number]

## 2020-03-21 IMAGING — DX DG THORACIC SPINE 3V
3 series · 3 of 3 positions shown · non-contrast
Comparison: Chest radiograph 06/19/2017

CLINICAL DATA: RIGHT upper back pain with radiation to RIGHT side
for 3 weeks, no known injury

EXAM:
THORACIC SPINE - 3 VIEWS

[thoracic spine ap]
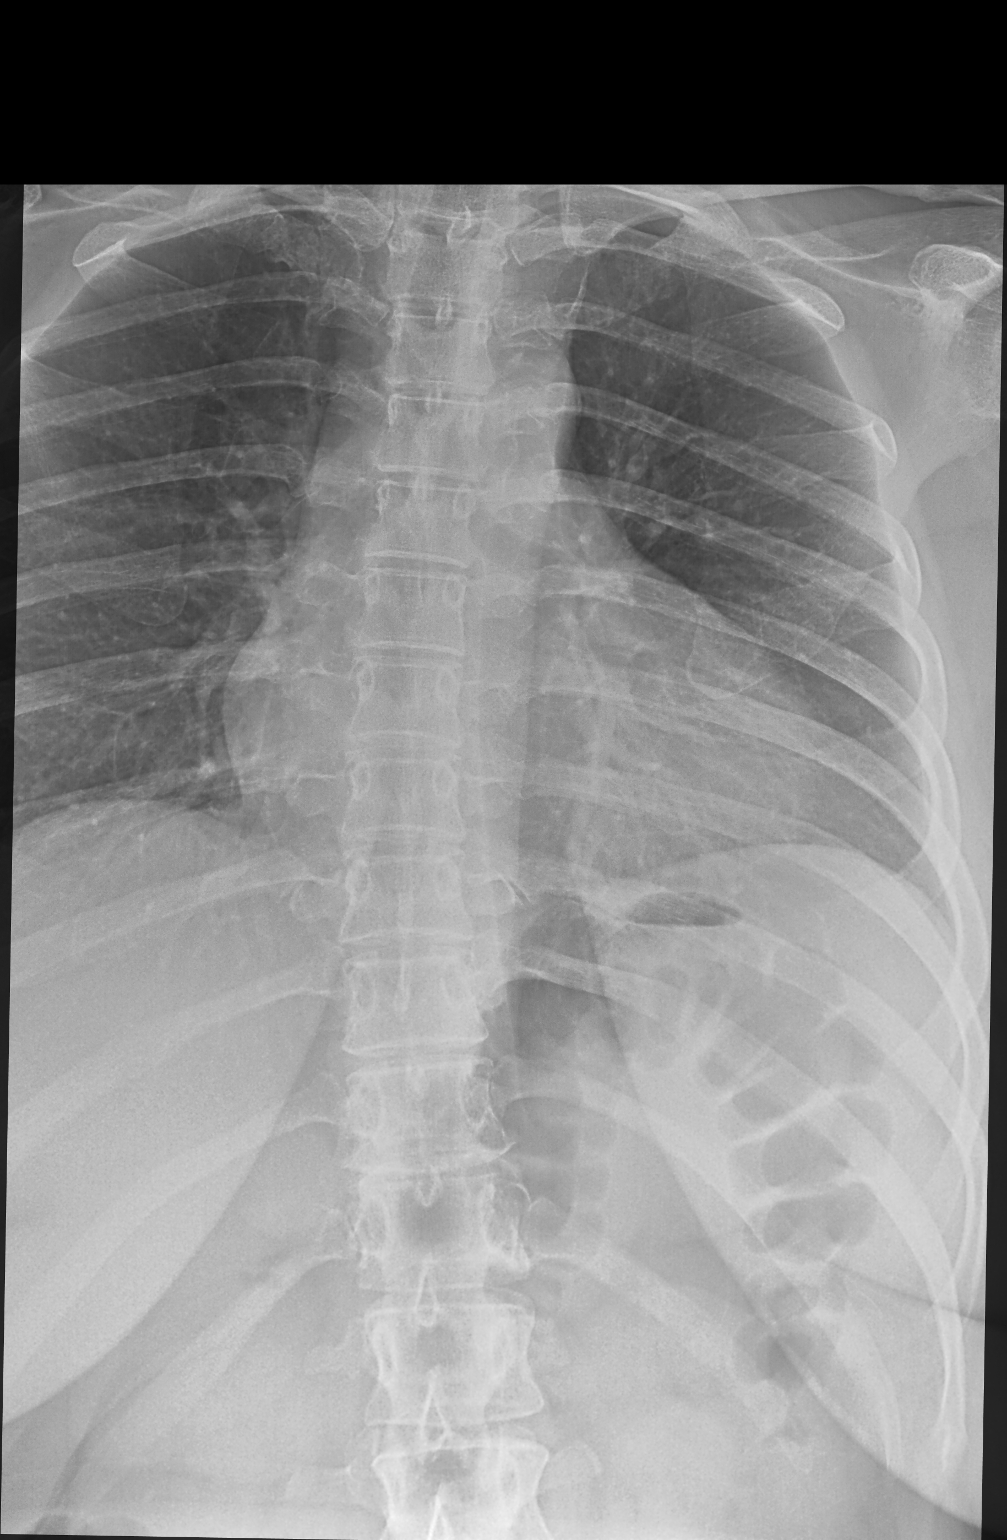

[thoracic spine lat]
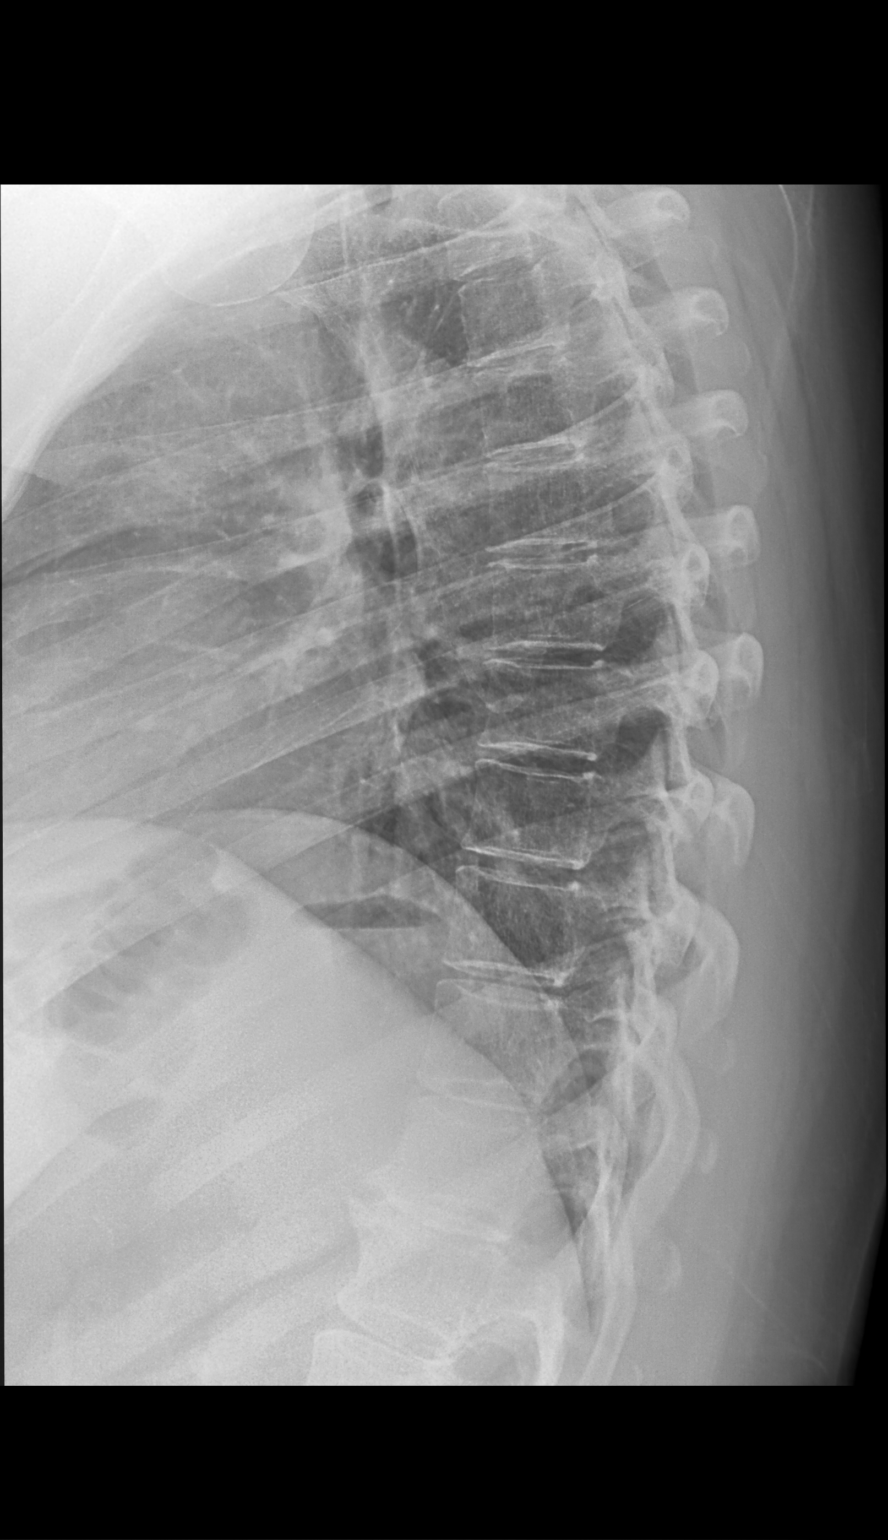

[swimmers lat]
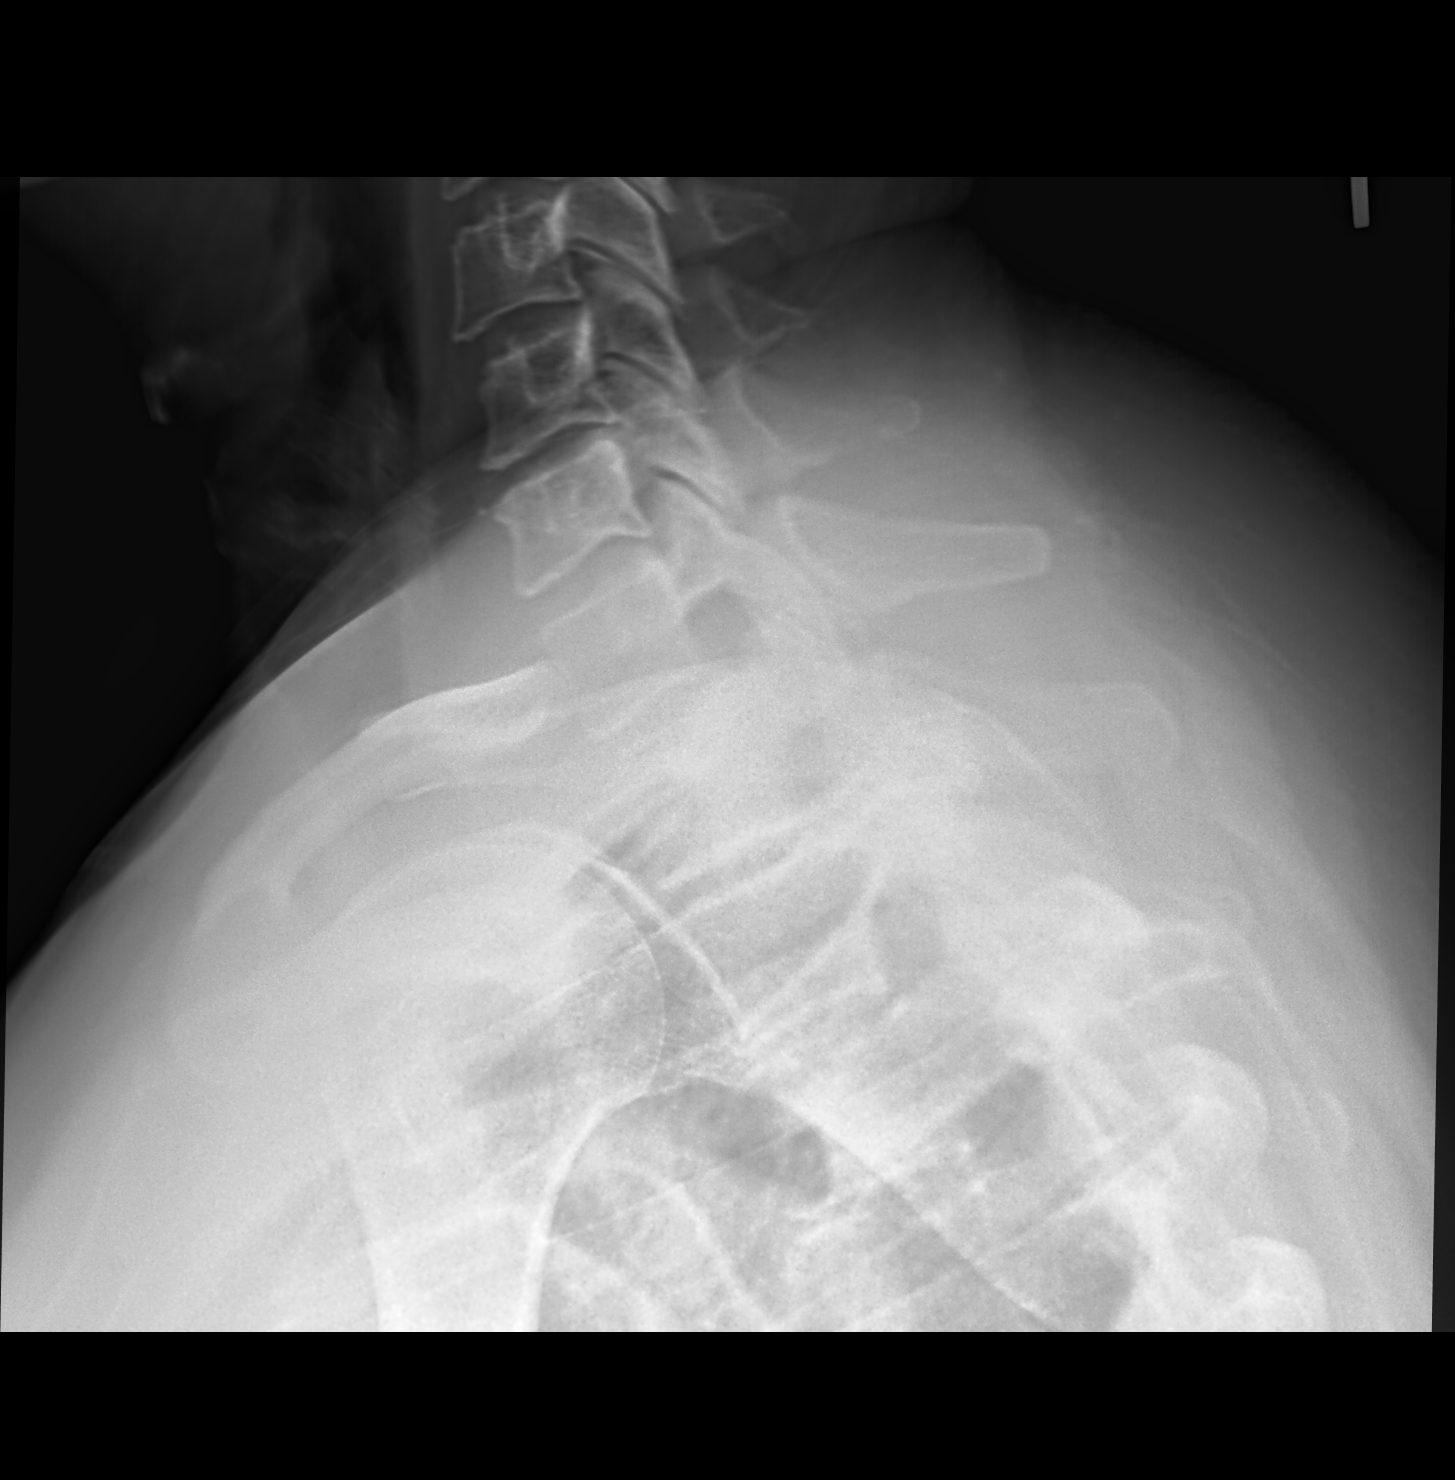

[3 of 3 positions shown; findings below may reference images not displayed]

FINDINGS: Twelve pairs of ribs.

Broad-based dextroconvex thoracic scoliosis.

Bones appear mildly demineralized.

Disc space narrowing and endplate spur formation at T11-T12.

Vertebral body heights maintained without fracture, subluxation, or
bone destruction.

Visualized ribs unremarkable.
IMPRESSION: Broad-based dextroconvex thoracic scoliosis with mild degenerative
disc disease changes at T11-T12.

No acute abnormalities.

## 2020-03-25 DIAGNOSIS — H5010 Unspecified exotropia: Secondary | ICD-10-CM | POA: Diagnosis not present

## 2020-04-07 ENCOUNTER — Ambulatory Visit: Payer: Medicare Other | Attending: Internal Medicine

## 2020-04-07 DIAGNOSIS — Z23 Encounter for immunization: Secondary | ICD-10-CM

## 2020-04-07 NOTE — Progress Notes (Signed)
   Covid-19 Vaccination Clinic  Name:  Margaret Zuniga    MRN: [id number] DOB: 11-13-1981  04/07/2020  [name] was observed post Covid-19 immunization for 15 minutes without incident. She was provided with Vaccine Information Sheet and instruction to access the V-Safe system.   [name] was instructed to call 911 with any severe reactions post vaccine: Marland Kitchen Difficulty breathing  . Swelling of face and throat  . A fast heartbeat  . A bad rash all over body  . Dizziness and weakness   Immunizations Administered    Name Date Dose VIS Date Route   Pfizer COVID-19 Vaccine 04/07/2020  2:37 PM 0.3 mL 02/04/2019 Intramuscular   Manufacturer: [place]   Lot: [id number]   [place]: [id number]

## 2020-04-14 DIAGNOSIS — G43719 Chronic migraine without aura, intractable, without status migrainosus: Secondary | ICD-10-CM | POA: Diagnosis not present

## 2020-04-22 DIAGNOSIS — M542 Cervicalgia: Secondary | ICD-10-CM | POA: Diagnosis not present

## 2020-04-22 DIAGNOSIS — G43719 Chronic migraine without aura, intractable, without status migrainosus: Secondary | ICD-10-CM | POA: Diagnosis not present

## 2020-04-22 DIAGNOSIS — M791 Myalgia, unspecified site: Secondary | ICD-10-CM | POA: Diagnosis not present

## 2020-04-26 ENCOUNTER — Ambulatory Visit: Payer: Medicare Other

## 2020-04-27 DIAGNOSIS — S83271D Complex tear of lateral meniscus, current injury, right knee, subsequent encounter: Secondary | ICD-10-CM | POA: Diagnosis not present

## 2020-04-27 DIAGNOSIS — G8929 Other chronic pain: Secondary | ICD-10-CM | POA: Diagnosis not present

## 2020-04-27 DIAGNOSIS — M25562 Pain in left knee: Secondary | ICD-10-CM | POA: Diagnosis not present

## 2020-04-27 DIAGNOSIS — M25561 Pain in right knee: Secondary | ICD-10-CM | POA: Diagnosis not present

## 2020-04-27 DIAGNOSIS — M7121 Synovial cyst of popliteal space [Baker], right knee: Secondary | ICD-10-CM | POA: Diagnosis not present

## 2020-04-28 ENCOUNTER — Ambulatory Visit (INDEPENDENT_AMBULATORY_CARE_PROVIDER_SITE_OTHER): Payer: Medicare Other | Admitting: Obstetrics and Gynecology

## 2020-04-28 ENCOUNTER — Encounter: Payer: Self-pay | Admitting: Obstetrics and Gynecology

## 2020-04-28 ENCOUNTER — Other Ambulatory Visit: Payer: Self-pay

## 2020-04-28 DIAGNOSIS — Z3042 Encounter for surveillance of injectable contraceptive: Secondary | ICD-10-CM

## 2020-04-28 MED ORDER — MEDROXYPROGESTERONE ACETATE 150 MG/ML IM SUSP
150.0000 mg | Freq: Once | INTRAMUSCULAR | Status: AC
  Administered 2020-04-28: 150 mg via INTRAMUSCULAR

## 2020-04-28 NOTE — Progress Notes (Signed)
Last depo inj: 02/09/20 UPT: N/A Side effects: none Next Depo- Provera injection due: 07/14/20-07/28/20 Annual exam due: 11/06/19

## 2020-06-02 ENCOUNTER — Other Ambulatory Visit: Payer: Self-pay | Admitting: Internal Medicine

## 2020-06-03 DIAGNOSIS — M542 Cervicalgia: Secondary | ICD-10-CM | POA: Diagnosis not present

## 2020-06-03 DIAGNOSIS — G43719 Chronic migraine without aura, intractable, without status migrainosus: Secondary | ICD-10-CM | POA: Diagnosis not present

## 2020-06-03 DIAGNOSIS — M791 Myalgia, unspecified site: Secondary | ICD-10-CM | POA: Diagnosis not present

## 2020-06-21 DIAGNOSIS — J45909 Unspecified asthma, uncomplicated: Secondary | ICD-10-CM | POA: Diagnosis not present

## 2020-07-06 DIAGNOSIS — S83271D Complex tear of lateral meniscus, current injury, right knee, subsequent encounter: Secondary | ICD-10-CM | POA: Diagnosis not present

## 2020-07-06 DIAGNOSIS — M7121 Synovial cyst of popliteal space [Baker], right knee: Secondary | ICD-10-CM | POA: Diagnosis not present

## 2020-07-06 DIAGNOSIS — M25561 Pain in right knee: Secondary | ICD-10-CM | POA: Diagnosis not present

## 2020-07-06 DIAGNOSIS — M1711 Unilateral primary osteoarthritis, right knee: Secondary | ICD-10-CM | POA: Diagnosis not present

## 2020-07-06 DIAGNOSIS — M25461 Effusion, right knee: Secondary | ICD-10-CM | POA: Diagnosis not present

## 2020-07-07 DIAGNOSIS — G43719 Chronic migraine without aura, intractable, without status migrainosus: Secondary | ICD-10-CM | POA: Diagnosis not present

## 2020-07-12 DIAGNOSIS — M791 Myalgia, unspecified site: Secondary | ICD-10-CM | POA: Diagnosis not present

## 2020-07-12 DIAGNOSIS — M542 Cervicalgia: Secondary | ICD-10-CM | POA: Diagnosis not present

## 2020-07-12 DIAGNOSIS — G43719 Chronic migraine without aura, intractable, without status migrainosus: Secondary | ICD-10-CM | POA: Diagnosis not present

## 2020-07-26 ENCOUNTER — Ambulatory Visit (INDEPENDENT_AMBULATORY_CARE_PROVIDER_SITE_OTHER): Payer: Medicare Other

## 2020-07-26 ENCOUNTER — Other Ambulatory Visit: Payer: Self-pay

## 2020-07-26 DIAGNOSIS — Z3042 Encounter for surveillance of injectable contraceptive: Secondary | ICD-10-CM | POA: Diagnosis not present

## 2020-07-26 MED ORDER — MEDROXYPROGESTERONE ACETATE 150 MG/ML IM SUSP
150.0000 mg | Freq: Once | INTRAMUSCULAR | Status: AC
  Administered 2020-07-26: 150 mg via INTRAMUSCULAR

## 2020-07-26 MED ORDER — MEDROXYPROGESTERONE ACETATE 150 MG/ML IM SUSP: 150.0000 mg | INTRAMUSCULAR | 0 refills | Status: DC

## 2020-07-26 NOTE — Progress Notes (Signed)
Date last pap: 11/05/2018. Last Depo-Provera: 04/28/2020. Last Annual/Appointment: 11/11/2019. Side Effects if any: none. Serum HCG indicated n/a Depo-Provera 150 mg IM given by: F.Caiden Monsivais, LPN Next appointment due November 1-15, 2021.

## 2020-07-28 ENCOUNTER — Ambulatory Visit: Payer: Medicare Other

## 2020-07-29 ENCOUNTER — Ambulatory Visit: Payer: Medicare Other

## 2020-08-04 ENCOUNTER — Ambulatory Visit
Admission: RE | Admit: 2020-08-04 | Discharge: 2020-08-04 | Disposition: A | Discharge status: Home / Self Care | Payer: Medicare Other | Source: Ambulatory Visit | Attending: Urology | Admitting: Urology

## 2020-08-04 ENCOUNTER — Ambulatory Visit (INDEPENDENT_AMBULATORY_CARE_PROVIDER_SITE_OTHER): Payer: Medicare Other | Admitting: Urology

## 2020-08-04 ENCOUNTER — Other Ambulatory Visit: Payer: Self-pay

## 2020-08-04 ENCOUNTER — Ambulatory Visit
Admission: RE | Admit: 2020-08-04 | Discharge: 2020-08-04 | Disposition: A | Discharge status: Home / Self Care | Payer: Medicare Other | Attending: Urology | Admitting: Urology

## 2020-08-04 ENCOUNTER — Encounter: Payer: Self-pay | Admitting: Urology

## 2020-08-04 VITALS — BP 137/86 | HR 103 | Ht 63.0 in | Wt 200.0 lb

## 2020-08-04 DIAGNOSIS — N200 Calculus of kidney: Secondary | ICD-10-CM | POA: Insufficient documentation

## 2020-08-04 DIAGNOSIS — Z87442 Personal history of urinary calculi: Secondary | ICD-10-CM | POA: Diagnosis not present

## 2020-08-04 NOTE — Progress Notes (Signed)
08/04/2020 1:25 PM   Margaret Zuniga July 14, 1981 [id number]  Referring provider: Crecencio Mc, MD [address],  [place] [address]  Chief Complaint  Patient presents with  . Nephrolithiasis    Urologic history: 1.  Nephrolithiasis -Left distal ureteral calculi 01/2020, declined ureteroscopy and opted for ESWL -Punctate left renal calculus on CT -Stone analysis mixed calcium oxalate/calcium phosphate (45/35/20)  HPI: 39 y.o. female presents for 37-month follow-up.   Denies flank, abdominal or pelvic pain  No dysuria or gross hematuria   PMH: Past Medical History:  Diagnosis Date  . Bilateral nephrolithiasis 03/24/2018  . Essential hypertension, benign 04/04/2016  . Extrinsic asthma 08/15/2016  . 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 History of kidney stones   . Hypertension   . Major depressive disorder, recurrent episode, moderate ([place]) 05/21/2013  . Obesity (BMI 30.0-34.9) 04/11/2017  . 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 Personal history of traumatic brain injury 73  . Shoulder impingement 2009   surgical relesase, Dr. Marry Guan    Surgical History: Past Surgical History:  Procedure Laterality Date  . ELBOW SURGERY Right 1995  . EXTRACORPOREAL SHOCK WAVE LITHOTRIPSY Left 01/22/2020   Procedure: EXTRACORPOREAL SHOCK WAVE LITHOTRIPSY (ESWL);  Surgeon: Abbie Sons, MD;  Location: [facility] ORS;  Service: Urology;  Laterality: Left;  . EYE SURGERY  1995  . KNEE ARTHROSCOPY WITH LATERAL MENISECTOMY Right 03/04/2020   Procedure: KNEE ARTHROSCOPY WITH PARTIAL LATERAL MENISECTOMY;  Surgeon: Hessie Knows, MD;  Location: [facility] ORS;  Service: Orthopedics;  Laterality: Right;  . LEG SURGERY  1985  . SHOULDER SURGERY    . SUBACROMIAL DECOMPRESSION  2000   Right shoulder, [place]  . TONSILLECTOMY  2001    Home Medications:  Allergies  as of 08/04/2020      Reactions   Zonegran [zonisamide] Rash      Medication List       Accurate as of August 04, 2020  1:25 PM. If you have any questions, ask your nurse or doctor.        albuterol 108 (90 Base) MCG/ACT inhaler Commonly known as: VENTOLIN HFA TAKE 2 PUFFS BY MOUTH EVERY 6 HOURS AS NEEDED FOR WHEEZE OR SHORTNESS OF BREATH What changed: See the new instructions.   amLODipine 5 MG tablet Commonly known as: NORVASC TAKE 1 TABLET BY MOUTH EVERY DAY   baclofen 10 MG tablet Commonly known as: LIORESAL Take 10 mg by mouth See admin instructions. Take 10 mg by mouth twice daily on Wednesday and Saturday   Breo Ellipta 100-25 MCG/INH Aepb Generic drug: fluticasone furoate-vilanterol Inhale into the lungs.   carbamazepine 300 MG 12 hr capsule Commonly known as: CARBATROL Take 300 mg by mouth in the morning, at noon, and at bedtime.   cetirizine 10 MG tablet Commonly known as: ZYRTEC Take 10 mg by mouth daily.   escitalopram 10 MG tablet Commonly known as: LEXAPRO TAKE 1 TABLET BY MOUTH EVERY DAY   Excedrin Tension Headache 500-65 MG Tabs Generic drug: Acetaminophen-Caffeine Take 1 tablet by mouth in the morning, at noon, and at bedtime.   medroxyPROGESTERone Acetate 150 MG/ML [place] INJECT 1 ML (150 MG TOTAL) INTO THE MUSCLE EVERY 3 (THREE) MONTHS.   medroxyPROGESTERone 150 MG/ML injection Commonly known as: DEPO-PROVERA Inject 1 mL (150 mg total) into the muscle every 3 (three) months.  Melatonin 5 MG Caps Take 5 mg by mouth at bedtime.   meloxicam 15 MG tablet Commonly known as: MOBIC TAKE 1 TABLET BY MOUTH EVERY DAY   metoprolol succinate 100 MG 24 hr tablet Commonly known as: TOPROL-XL TAKE 1 TABLET (100 MG TOTAL) BY MOUTH DAILY. TAKE WITH OR IMMEDIATELY FOLLOWING A MEAL.   multivitamin with minerals Tabs tablet Take 1 tablet by mouth daily.       Allergies:  Allergies  Allergen Reactions  . Zonegran [Zonisamide] Rash    Family  History: Family History  Problem Relation Age of Onset  . Diabetes Mother   . Coronary artery disease Mother   . Hyperlipidemia Mother   . Hypertension Mother   . Parkinson's disease Mother   . Heart disease Maternal Grandfather   . Healthy Father     Social History:  reports that she has never smoked. She has never used smokeless tobacco. She reports that she does not drink alcohol and does not use drugs.   Physical Exam: BP 137/86   Pulse (!) 103   Ht 5\' 3"  (1.6 m)   Wt 200 lb (90.7 kg)   LMP  (LMP Unknown)   BMI 35.43 kg/m   Constitutional:  Alert and oriented, No acute distress. HEENT: [place] AT, moist mucus membranes.  Trachea midline, no masses. Cardiovascular: No clubbing, cyanosis, or edema. Respiratory: Normal respiratory effort, no increased work of breathing. Skin: No rashes, bruises or suspicious lesions. Neurologic: Grossly intact, no focal deficits, moving all 4 extremities. Psychiatric: Normal mood and affect.   Pertinent Imaging: KUB performed today reviewed; moderate amount of stool in bag acid screening renal outlines, no definite left renal calcifications noted   Assessment & Plan:    1.  Left nephrolithiasis  Punctate left renal calculus on CT  Follow-up 1 year with KUB  Follow-up earlier prn development of pain   Abbie Sons, MD  [place] [address] [place],  [address] [phone]

## 2020-08-17 DIAGNOSIS — M1711 Unilateral primary osteoarthritis, right knee: Secondary | ICD-10-CM | POA: Diagnosis not present

## 2020-08-17 DIAGNOSIS — M25561 Pain in right knee: Secondary | ICD-10-CM | POA: Diagnosis not present

## 2020-08-17 DIAGNOSIS — M7121 Synovial cyst of popliteal space [Baker], right knee: Secondary | ICD-10-CM | POA: Diagnosis not present

## 2020-08-17 DIAGNOSIS — M25461 Effusion, right knee: Secondary | ICD-10-CM | POA: Diagnosis not present

## 2020-08-17 DIAGNOSIS — S83271D Complex tear of lateral meniscus, current injury, right knee, subsequent encounter: Secondary | ICD-10-CM | POA: Diagnosis not present

## 2020-08-20 DIAGNOSIS — Z20822 Contact with and (suspected) exposure to covid-19: Secondary | ICD-10-CM | POA: Diagnosis not present

## 2020-08-31 DIAGNOSIS — G43719 Chronic migraine without aura, intractable, without status migrainosus: Secondary | ICD-10-CM | POA: Diagnosis not present

## 2020-08-31 DIAGNOSIS — M542 Cervicalgia: Secondary | ICD-10-CM | POA: Diagnosis not present

## 2020-08-31 DIAGNOSIS — M791 Myalgia, unspecified site: Secondary | ICD-10-CM | POA: Diagnosis not present

## 2020-09-02 ENCOUNTER — Other Ambulatory Visit: Payer: Self-pay | Admitting: Internal Medicine

## 2020-09-02 DIAGNOSIS — M25561 Pain in right knee: Secondary | ICD-10-CM | POA: Diagnosis not present

## 2020-09-02 DIAGNOSIS — M7121 Synovial cyst of popliteal space [Baker], right knee: Secondary | ICD-10-CM | POA: Diagnosis not present

## 2020-09-02 DIAGNOSIS — M1711 Unilateral primary osteoarthritis, right knee: Secondary | ICD-10-CM | POA: Diagnosis not present

## 2020-09-02 DIAGNOSIS — M25461 Effusion, right knee: Secondary | ICD-10-CM | POA: Diagnosis not present

## 2020-09-02 DIAGNOSIS — G8929 Other chronic pain: Secondary | ICD-10-CM | POA: Diagnosis not present

## 2020-09-08 DIAGNOSIS — M1711 Unilateral primary osteoarthritis, right knee: Secondary | ICD-10-CM | POA: Diagnosis not present

## 2020-09-08 DIAGNOSIS — M25461 Effusion, right knee: Secondary | ICD-10-CM | POA: Diagnosis not present

## 2020-09-08 DIAGNOSIS — G8929 Other chronic pain: Secondary | ICD-10-CM | POA: Diagnosis not present

## 2020-09-08 DIAGNOSIS — M25561 Pain in right knee: Secondary | ICD-10-CM | POA: Diagnosis not present

## 2020-09-09 DIAGNOSIS — M25561 Pain in right knee: Secondary | ICD-10-CM | POA: Diagnosis not present

## 2020-09-09 DIAGNOSIS — M7121 Synovial cyst of popliteal space [Baker], right knee: Secondary | ICD-10-CM | POA: Diagnosis not present

## 2020-09-09 DIAGNOSIS — M25461 Effusion, right knee: Secondary | ICD-10-CM | POA: Diagnosis not present

## 2020-09-09 DIAGNOSIS — M1711 Unilateral primary osteoarthritis, right knee: Secondary | ICD-10-CM | POA: Diagnosis not present

## 2020-09-09 DIAGNOSIS — G8929 Other chronic pain: Secondary | ICD-10-CM | POA: Diagnosis not present

## 2020-09-15 DIAGNOSIS — M1711 Unilateral primary osteoarthritis, right knee: Secondary | ICD-10-CM | POA: Diagnosis not present

## 2020-09-15 DIAGNOSIS — M25561 Pain in right knee: Secondary | ICD-10-CM | POA: Diagnosis not present

## 2020-09-15 DIAGNOSIS — M25461 Effusion, right knee: Secondary | ICD-10-CM | POA: Diagnosis not present

## 2020-09-15 DIAGNOSIS — G8929 Other chronic pain: Secondary | ICD-10-CM | POA: Diagnosis not present

## 2020-09-16 DIAGNOSIS — M7121 Synovial cyst of popliteal space [Baker], right knee: Secondary | ICD-10-CM | POA: Diagnosis not present

## 2020-09-16 DIAGNOSIS — M1711 Unilateral primary osteoarthritis, right knee: Secondary | ICD-10-CM | POA: Diagnosis not present

## 2020-09-16 DIAGNOSIS — M25461 Effusion, right knee: Secondary | ICD-10-CM | POA: Diagnosis not present

## 2020-09-16 DIAGNOSIS — G8929 Other chronic pain: Secondary | ICD-10-CM | POA: Diagnosis not present

## 2020-09-16 DIAGNOSIS — M25561 Pain in right knee: Secondary | ICD-10-CM | POA: Diagnosis not present

## 2020-09-22 DIAGNOSIS — M25461 Effusion, right knee: Secondary | ICD-10-CM | POA: Diagnosis not present

## 2020-09-22 DIAGNOSIS — G8929 Other chronic pain: Secondary | ICD-10-CM | POA: Diagnosis not present

## 2020-09-22 DIAGNOSIS — M25561 Pain in right knee: Secondary | ICD-10-CM | POA: Diagnosis not present

## 2020-09-22 DIAGNOSIS — M1711 Unilateral primary osteoarthritis, right knee: Secondary | ICD-10-CM | POA: Diagnosis not present

## 2020-09-27 ENCOUNTER — Ambulatory Visit (INDEPENDENT_AMBULATORY_CARE_PROVIDER_SITE_OTHER): Payer: Medicare Other

## 2020-09-27 VITALS — Ht 63.0 in | Wt 200.0 lb

## 2020-09-27 DIAGNOSIS — Z0000 Encounter for general adult medical examination without abnormal findings: Secondary | ICD-10-CM

## 2020-09-27 NOTE — Patient Instructions (Addendum)
Margaret Zuniga , Thank you for taking time to come for your Medicare Wellness Visit. I appreciate your ongoing commitment to your health goals. Please review the following plan we discussed and let me know if I can assist you in the future.   These are the goals we discussed: Goals      Patient Stated   .  Healthy Lifestyle (pt-stated)      Stay active Stay hydrated Healthy diet       This is a list of the screening recommended for you and due dates:  Health Maintenance  Topic Date Due  . Pap Smear  11/05/2021  . Tetanus Vaccine  09/13/2025  . Flu Shot  Completed  . COVID-19 Vaccine  Completed  .  Hepatitis C: One time screening is recommended by Center for Disease Control  (CDC) for  adults born from [birth year] through 1967.   Completed  . HIV Screening  Completed    Immunizations Immunization History  Administered Date(s) Administered  . Influenza Split 09/01/2013  . Influenza,inj,Quad PF,6+ Mos 09/13/2017, 09/01/2019  . Influenza-Unspecified 08/21/2014, 08/31/2015, 08/18/2016, 08/22/2018, 09/18/2020  . PFIZER SARS-COV-2 Vaccination 03/15/2020, 04/07/2020  . Tdap 09/14/2015   Keep all routine maintenance appointments.     Advanced directives: .not yet completed  Conditions/risks identified: none new  Follow up in one year for your annual wellness visit.  Preventive care refers to lifestyle choices and visits with your health care provider that can promote health and wellness. What does preventive care include?  A yearly physical exam. This is also called an annual well check.  Dental exams once or twice a year.  Routine eye exams. Ask your health care provider how often you should have your eyes checked.  Personal lifestyle choices, including:  Daily care of your teeth and gums.  Regular physical activity.  Eating a healthy diet.  Avoiding tobacco and drug use.  Limiting alcohol use.  Practicing safe sex.  Taking low-dose aspirin every day.  Taking vitamin  and mineral supplements as recommended by your health care provider. What happens during an annual well check? The services and screenings done by your health care provider during your annual well check will depend on your age, overall health, lifestyle risk factors, and family history of disease. Counseling  Your health care provider may ask you questions about your:  Alcohol use.  Tobacco use.  Drug use.  Emotional well-being.  Home and relationship well-being.  Sexual activity.  Eating habits.  History of falls.  Memory and ability to understand (cognition).  Work and work Statistician.  Reproductive health. Screening  You may have the following tests or measurements:  Height, weight, and BMI.  Blood pressure.  Lipid and cholesterol levels. These may be checked every 5 years, or more frequently if you are over 17 years old.  Skin check.  Lung cancer screening. You may have this screening every year starting at age 65 if you have a 30-pack-year history of smoking and currently smoke or have quit within the past 15 years.  Fecal occult blood test (FOBT) of the stool. You may have this test every year starting at age 41.  Flexible sigmoidoscopy or colonoscopy. You may have a sigmoidoscopy every 5 years or a colonoscopy every 10 years starting at age 47.  Hepatitis C blood test.  Hepatitis B blood test.  Sexually transmitted disease (STD) testing.  Diabetes screening. This is done by checking your blood sugar (glucose) after you have not eaten for a while (  fasting). You may have this done every 1-3 years.  Bone density scan. This is done to screen for osteoporosis. You may have this done starting at age 60.  Mammogram. This may be done every 1-2 years. Talk to your health care provider about how often you should have regular mammograms. Talk with your health care provider about your test results, treatment options, and if necessary, the need for more  tests. Vaccines  Your health care provider may recommend certain vaccines, such as:  Influenza vaccine. This is recommended every year.  Tetanus, diphtheria, and acellular pertussis (Tdap, Td) vaccine. You may need a Td booster every 10 years.  Zoster vaccine. You may need this after age 69.  Pneumococcal 13-valent conjugate (PCV13) vaccine. One dose is recommended after age 75.  Pneumococcal polysaccharide (PPSV23) vaccine. One dose is recommended after age 109. Talk to your health care provider about which screenings and vaccines you need and how often you need them. This information is not intended to replace advice given to you by your health care provider. Make sure you discuss any questions you have with your health care provider. Document Released: 12/24/2015 Document Revised: 08/16/2016 Document Reviewed: 09/28/2015 Elsevier Interactive Patient Education  2017 [place] Prevention in the Home Falls can cause injuries. They can happen to people of all ages. There are many things you can do to make your home safe and to help prevent falls. What can I do on the outside of my home?  Regularly fix the edges of walkways and driveways and fix any cracks.  Remove anything that might make you trip as you walk through a door, such as a raised step or threshold.  Trim any bushes or trees on the path to your home.  Use bright outdoor lighting.  Clear any walking paths of anything that might make someone trip, such as rocks or tools.  Regularly check to see if handrails are loose or broken. Make sure that both sides of any steps have handrails.  Any raised decks and porches should have guardrails on the edges.  Have any leaves, snow, or ice cleared regularly.  Use sand or salt on walking paths during winter.  Clean up any spills in your garage right away. This includes oil or grease spills. What can I do in the bathroom?  Use night lights.  Install grab bars by the  toilet and in the tub and shower. Do not use towel bars as grab bars.  Use non-skid mats or decals in the tub or shower.  If you need to sit down in the shower, use a plastic, non-slip stool.  Keep the floor dry. Clean up any water that spills on the floor as soon as it happens.  Remove soap buildup in the tub or shower regularly.  Attach bath mats securely with double-sided non-slip rug tape.  Do not have throw rugs and other things on the floor that can make you trip. What can I do in the bedroom?  Use night lights.  Make sure that you have a light by your bed that is easy to reach.  Do not use any sheets or blankets that are too big for your bed. They should not hang down onto the floor.  Have a firm chair that has side arms. You can use this for support while you get dressed.  Do not have throw rugs and other things on the floor that can make you trip. What can I do in the kitchen?  Clean up any spills right away.  Avoid walking on wet floors.  Keep items that you use a lot in easy-to-reach places.  If you need to reach something above you, use a strong step stool that has a grab bar.  Keep electrical cords out of the way.  Do not use floor polish or wax that makes floors slippery. If you must use wax, use non-skid floor wax.  Do not have throw rugs and other things on the floor that can make you trip. What can I do with my stairs?  Do not leave any items on the stairs.  Make sure that there are handrails on both sides of the stairs and use them. Fix handrails that are broken or loose. Make sure that handrails are as long as the stairways.  Check any carpeting to make sure that it is firmly attached to the stairs. Fix any carpet that is loose or worn.  Avoid having throw rugs at the top or bottom of the stairs. If you do have throw rugs, attach them to the floor with carpet tape.  Make sure that you have a light switch at the top of the stairs and the bottom of  the stairs. If you do not have them, ask someone to add them for you. What else can I do to help prevent falls?  Wear shoes that:  Do not have high heels.  Have rubber bottoms.  Are comfortable and fit you well.  Are closed at the toe. Do not wear sandals.  If you use a stepladder:  Make sure that it is fully opened. Do not climb a closed stepladder.  Make sure that both sides of the stepladder are locked into place.  Ask someone to hold it for you, if possible.  Clearly mark and make sure that you can see:  Any grab bars or handrails.  First and last steps.  Where the edge of each step is.  Use tools that help you move around (mobility aids) if they are needed. These include:  Canes.  Walkers.  Scooters.  Crutches.  Turn on the lights when you go into a dark area. Replace any light bulbs as soon as they burn out.  Set up your furniture so you have a clear path. Avoid moving your furniture around.  If any of your floors are uneven, fix them.  If there are any pets around you, be aware of where they are.  Review your medicines with your doctor. Some medicines can make you feel dizzy. This can increase your chance of falling. Ask your doctor what other things that you can do to help prevent falls. This information is not intended to replace advice given to you by your health care provider. Make sure you discuss any questions you have with your health care provider. Document Released: 09/23/2009 Document Revised: 05/04/2016 Document Reviewed: 01/01/2015 Elsevier Interactive Patient Education  2017 [employer].

## 2020-09-27 NOTE — Progress Notes (Signed)
Subjective:   Margaret Zuniga is a 39 y.o. female who presents for Medicare Annual (Subsequent) preventive examination.  Review of Systems    No ROS.  Medicare Wellness Virtual Visit.    Cardiac Risk Factors include: advanced age (>87men, >65 women);hypertension     Objective:    Today's Vitals   09/27/20 1054  Weight: 200 lb (90.7 kg)  Height: 5\' 3"  (1.6 m)   Body mass index is 35.43 kg/m.  Advanced Directives 09/27/2020 03/04/2020 02/26/2020 09/25/2019 09/06/2018 04/10/2018 09/05/2017  Does Patient Have a Medical Advance Directive? No No Yes Yes No No No  Type of Advance Directive - Programmer, multimedia of Attorney - - -  Does patient want to make changes to medical advance directive? - - - No - Patient declined - - -  Copy of [place] in Chart? - - No - copy requested - - - -  Would patient like information on creating a medical advance directive? No - Patient declined No - Patient declined - - Yes (MAU/Ambulatory/Procedural Areas - Information given) No - Patient declined No - Patient declined  Pre-existing out of facility DNR order (yellow form or pink MOST form) - - - - - - -    Current Medications (verified) Outpatient Encounter Medications as of 09/27/2020  Medication Sig  . Acetaminophen-Caffeine (EXCEDRIN TENSION HEADACHE) 500-65 MG TABS Take 1 tablet by mouth in the morning, at noon, and at bedtime.   Marland Kitchen albuterol (VENTOLIN HFA) 108 (90 Base) MCG/ACT inhaler TAKE 2 PUFFS BY MOUTH EVERY 6 HOURS AS NEEDED FOR WHEEZE OR SHORTNESS OF BREATH (Patient taking differently: Inhale 2 puffs into the lungs every 6 (six) hours as needed for wheezing or shortness of breath. )  . amLODipine (NORVASC) 5 MG tablet TAKE 1 TABLET BY MOUTH EVERY DAY  . baclofen (LIORESAL) 10 MG tablet Take 10 mg by mouth See admin instructions. Take 10 mg by mouth twice daily on Wednesday and Saturday  . carbamazepine (CARBATROL) 300 MG 12 hr capsule Take 300 mg by  mouth in the morning, at noon, and at bedtime.   . cetirizine (ZYRTEC) 10 MG tablet Take 10 mg by mouth daily.  Marland Kitchen escitalopram (LEXAPRO) 10 MG tablet TAKE 1 TABLET BY MOUTH EVERY DAY  . fluticasone furoate-vilanterol (BREO ELLIPTA) 100-25 MCG/INH AEPB Inhale into the lungs.  . medroxyPROGESTERone (DEPO-PROVERA) 150 MG/ML injection Inject 1 mL (150 mg total) into the muscle every 3 (three) months.  . medroxyPROGESTERone Acetate 150 MG/ML SUSY INJECT 1 ML (150 MG TOTAL) INTO THE MUSCLE EVERY 3 (THREE) MONTHS.  . Melatonin 5 MG CAPS Take 5 mg by mouth at bedtime.   . meloxicam (MOBIC) 15 MG tablet TAKE 1 TABLET BY MOUTH EVERY DAY  . metoprolol succinate (TOPROL-XL) 100 MG 24 hr tablet TAKE 1 TABLET (100 MG TOTAL) BY MOUTH DAILY. TAKE WITH OR IMMEDIATELY FOLLOWING A MEAL.  . Multiple Vitamin (MULTIVITAMIN WITH MINERALS) TABS tablet Take 1 tablet by mouth daily.   No facility-administered encounter medications on file as of 09/27/2020.    Allergies (verified) Zonegran [zonisamide]   History: Past Medical History:  Diagnosis Date  . Bilateral nephrolithiasis 03/24/2018  . Essential hypertension, benign 04/04/2016  . Extrinsic asthma 08/15/2016  . 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 History of kidney stones   . Hypertension   . Major depressive disorder, recurrent episode, moderate ([place]) 05/21/2013  .  Obesity (BMI 30.0-34.9) 04/11/2017  . 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 Personal history of traumatic brain injury 28  . Shoulder impingement 2009   surgical relesase, Dr. Marry Guan   Past Surgical History:  Procedure Laterality Date  . ELBOW SURGERY Right 1995  . EXTRACORPOREAL SHOCK WAVE LITHOTRIPSY Left 01/22/2020   Procedure: EXTRACORPOREAL SHOCK WAVE LITHOTRIPSY (ESWL);  Surgeon: Abbie Sons, MD;  Location: [facility] ORS;  Service: Urology;  Laterality: Left;  .  EYE SURGERY  1995  . KNEE ARTHROSCOPY WITH LATERAL MENISECTOMY Right 03/04/2020   Procedure: KNEE ARTHROSCOPY WITH PARTIAL LATERAL MENISECTOMY;  Surgeon: Hessie Knows, MD;  Location: [facility] ORS;  Service: Orthopedics;  Laterality: Right;  . LEG SURGERY  1985  . SHOULDER SURGERY    . SUBACROMIAL DECOMPRESSION  2000   Right shoulder, [place]  . TONSILLECTOMY  2001   Family History  Problem Relation Age of Onset  . Diabetes Mother   . Coronary artery disease Mother   . Hyperlipidemia Mother   . Hypertension Mother   . Parkinson's disease Mother   . Heart disease Maternal Grandfather   . Healthy Father   . Stroke Father    Social History   Socioeconomic History  . Marital status: Single    Spouse name: Not on file  . Number of children: Not on file  . Years of education: Not on file  . Highest education level: Not on file  Occupational History  . Not on file  Tobacco Use  . Smoking status: Never Smoker  . Smokeless tobacco: Never Used  Vaping Use  . Vaping Use: Never used  Substance and Sexual Activity  . Alcohol use: No  . Drug use: No  . Sexual activity: Yes    Birth control/protection: Injection  Other Topics Concern  . Not on file  Social History Narrative  . Not on file   Social Determinants of Health   Financial Resource Strain: Low Risk   . Difficulty of Paying Living Expenses: Not hard at all  Food Insecurity: No Food Insecurity  . Worried About Charity fundraiser in the Last Year: Never true  . Ran Out of Food in the Last Year: Never true  Transportation Needs: No Transportation Needs  . Lack of Transportation (Medical): No  . Lack of Transportation (Non-Medical): No  Physical Activity:   . Days of Exercise per Week: Not on file  . Minutes of Exercise per Session: Not on file  Stress: No Stress Concern Present  . Feeling of Stress : Not at all  Social Connections: Unknown  . Frequency of Communication with Friends and Family: Not on file  . Frequency  of Social Gatherings with Friends and Family: More than three times a week  . Attends Religious Services: Not on file  . Active Member of Clubs or Organizations: Not on file  . Attends Archivist Meetings: Not on file  . Marital Status: Never married    Tobacco Counseling Counseling given: Not Answered   Clinical Intake:  Pre-visit preparation completed: Yes        Diabetes: No  How often do you need to have someone help you when you read instructions, pamphlets, or other written materials from your doctor or pharmacy?: 1 - Never  Interpreter Needed?: No      Activities of Daily Living In your present state of health, do you have any difficulty performing the  following activities: 09/27/2020 02/26/2020  Hearing? N N  Vision? N N  Difficulty concentrating or making decisions? N N  Walking or climbing stairs? N N  Dressing or bathing? N N  Doing errands, shopping? Y Y  Comment She does not drive DOES NOT DRIVE DUE TO HEADACHES  Preparing Food and eating ? Y -  Comment Mother assist with cooking. Self feeds. -  Using the Toilet? N -  In the past six months, have you accidently leaked urine? N -  Do you have problems with loss of bowel control? N -  Managing your Medications? N -  Managing your Finances? N -  Housekeeping or managing your Housekeeping? N -  Some recent data might be hidden    Patient Care Team: Crecencio Mc, MD as PCP - General (Internal Medicine)  Indicate any recent Medical Services you may have received from other than [facility] providers in the past year (date may be approximate).     Assessment:   This is a routine wellness examination for Shailene.  I connected with Eyla today by telephone and verified that I am speaking with the correct person using two identifiers. Location patient: home Location provider: work Persons participating in the virtual visit: patient, Marine scientist.    I discussed the limitations, risks, security and privacy  concerns of performing an evaluation and management service by telephone and the availability of in person appointments. The patient expressed understanding and verbally consented to this telephonic visit.    Interactive audio and video telecommunications were attempted between this provider and patient, however failed, due to patient having technical difficulties OR patient did not have access to video capability.  We continued and completed visit with audio only.  Some vital signs may be absent or patient reported.   Hearing/Vision screen  Hearing Screening   125Hz  250Hz  500Hz  1000Hz  2000Hz  3000Hz  4000Hz  6000Hz  8000Hz   Right ear:           Left ear:           Comments: Patient is able to hear conversational tones without difficulty. No issues reported.  Vision Screening Comments: [facility]  She does not wear glasses  Dietary issues and exercise activities discussed: Current Exercise Habits: Home exercise routine, Type of exercise: walking, Intensity: Mild  Goals      Patient Stated   .  Healthy Lifestyle (pt-stated)      Stay active Stay hydrated Healthy diet      Depression Screen PHQ 2/9 Scores 09/27/2020 09/25/2019 09/06/2018 09/05/2017 04/10/2017  PHQ - 2 Score 0 0 0 2 0  PHQ- 9 Score - - - 2 2    Fall Risk Fall Risk  09/27/2020 01/16/2020 09/25/2019 09/06/2018 09/05/2017  Falls in the past year? 1 1 0 No No  Number falls in past yr: 0 1 - - -  Comment Leg weakness - - - -  Injury with Fall? - 0 - - -  Follow up Falls evaluation completed Falls evaluation completed - - -   Handrails in use when climbing stairs? Yes Home free of loose throw rugs in walkways, pet beds, electrical cords, etc? Yes  Adequate lighting in your home to reduce risk of falls? Yes   ASSISTIVE DEVICES UTILIZED TO PREVENT FALLS: Life alert? No  Use of a cane, walker or w/c? No   TIMED UP AND GO: Was the test performed? No . Virtual visit.   Cognitive Function: MMSE - Mini  Mental State  Exam 09/05/2017  Orientation to time 5  Orientation to Place 5  Registration 3  Attention/ Calculation 5  Recall 3  Language- name 2 objects 2  Language- repeat 1  Language- follow 3 step command 3  Language- read & follow direction 1  Write a sentence 1  Copy design 1  Total score 30     6CIT Screen 09/27/2020 09/25/2019 09/06/2018  What Year? 0 points 0 points 0 points  What month? 0 points 0 points 0 points  What time? - 0 points 0 points  Count back from 20 - 0 points 0 points  Months in reverse 0 points 0 points 0 points  Repeat phrase - 0 points 0 points  Total Score - 0 0    Immunizations Immunization History  Administered Date(s) Administered  . Influenza Split 09/01/2013  . Influenza,inj,Quad PF,6+ Mos 09/13/2017, 09/01/2019  . Influenza-Unspecified 08/21/2014, 08/31/2015, 08/18/2016, 08/22/2018, 09/18/2020  . PFIZER SARS-COV-2 Vaccination 03/15/2020, 04/07/2020  . Tdap 09/14/2015    Health Maintenance Health Maintenance  Topic Date Due  . PAP SMEAR-Modifier  11/05/2021  . TETANUS/TDAP  09/13/2025  . INFLUENZA VACCINE  Completed  . COVID-19 Vaccine  Completed  . Hepatitis C Screening  Completed  . HIV Screening  Completed   Dental Screening: Recommended annual dental exams for proper oral hygiene  Community Resource Referral / Chronic Care Management: CRR required this visit?  No   CCM required this visit?  No      Plan:   Keep all routine maintenance appointments.   I have personally reviewed and noted the following in the patient's chart:   . Medical and social history . Use of alcohol, tobacco or illicit drugs  . Current medications and supplements . Functional ability and status . Nutritional status . Physical activity . Advanced directives . List of other physicians . Hospitalizations, surgeries, and ER visits in previous 12 months . Vitals . Screenings to include cognitive, depression, and falls . Referrals and  appointments  In addition, I have reviewed and discussed with patient certain preventive protocols, quality metrics, and best practice recommendations. A written personalized care plan for preventive services as well as general preventive health recommendations were provided to patient via mychart.     Varney Biles, LPN   [id number]

## 2020-09-29 DIAGNOSIS — G8929 Other chronic pain: Secondary | ICD-10-CM | POA: Diagnosis not present

## 2020-09-29 DIAGNOSIS — G43719 Chronic migraine without aura, intractable, without status migrainosus: Secondary | ICD-10-CM | POA: Diagnosis not present

## 2020-09-29 DIAGNOSIS — M25561 Pain in right knee: Secondary | ICD-10-CM | POA: Diagnosis not present

## 2020-09-29 DIAGNOSIS — M25461 Effusion, right knee: Secondary | ICD-10-CM | POA: Diagnosis not present

## 2020-09-29 DIAGNOSIS — M1711 Unilateral primary osteoarthritis, right knee: Secondary | ICD-10-CM | POA: Diagnosis not present

## 2020-10-12 DIAGNOSIS — M25561 Pain in right knee: Secondary | ICD-10-CM | POA: Diagnosis not present

## 2020-10-12 DIAGNOSIS — M542 Cervicalgia: Secondary | ICD-10-CM | POA: Diagnosis not present

## 2020-10-12 DIAGNOSIS — M25461 Effusion, right knee: Secondary | ICD-10-CM | POA: Diagnosis not present

## 2020-10-12 DIAGNOSIS — M1711 Unilateral primary osteoarthritis, right knee: Secondary | ICD-10-CM | POA: Diagnosis not present

## 2020-10-12 DIAGNOSIS — M791 Myalgia, unspecified site: Secondary | ICD-10-CM | POA: Diagnosis not present

## 2020-10-12 DIAGNOSIS — G43719 Chronic migraine without aura, intractable, without status migrainosus: Secondary | ICD-10-CM | POA: Diagnosis not present

## 2020-10-12 DIAGNOSIS — G8929 Other chronic pain: Secondary | ICD-10-CM | POA: Diagnosis not present

## 2020-10-15 ENCOUNTER — Other Ambulatory Visit: Payer: Self-pay | Admitting: Internal Medicine

## 2020-10-15 DIAGNOSIS — Z1231 Encounter for screening mammogram for malignant neoplasm of breast: Secondary | ICD-10-CM

## 2020-10-17 ENCOUNTER — Other Ambulatory Visit: Payer: Self-pay | Admitting: Obstetrics and Gynecology

## 2020-10-17 DIAGNOSIS — Z30013 Encounter for initial prescription of injectable contraceptive: Secondary | ICD-10-CM

## 2020-10-18 ENCOUNTER — Ambulatory Visit
Admission: RE | Admit: 2020-10-18 | Payer: Medicare Other | Source: Ambulatory Visit | Admitting: Obstetrics and Gynecology

## 2020-10-19 ENCOUNTER — Ambulatory Visit (INDEPENDENT_AMBULATORY_CARE_PROVIDER_SITE_OTHER): Payer: Medicare Other | Admitting: Internal Medicine

## 2020-10-19 ENCOUNTER — Other Ambulatory Visit: Payer: Self-pay

## 2020-10-19 ENCOUNTER — Ambulatory Visit (INDEPENDENT_AMBULATORY_CARE_PROVIDER_SITE_OTHER): Payer: Medicare Other

## 2020-10-19 ENCOUNTER — Ambulatory Visit
Admission: RE | Admit: 2020-10-19 | Discharge: 2020-10-19 | Disposition: A | Discharge status: Home / Self Care | Payer: Medicare Other | Source: Ambulatory Visit | Attending: Internal Medicine | Admitting: Internal Medicine

## 2020-10-19 ENCOUNTER — Encounter: Payer: Self-pay | Admitting: Internal Medicine

## 2020-10-19 ENCOUNTER — Ambulatory Visit (INDEPENDENT_AMBULATORY_CARE_PROVIDER_SITE_OTHER): Payer: Medicare Other | Admitting: Obstetrics and Gynecology

## 2020-10-19 ENCOUNTER — Encounter: Payer: Self-pay | Admitting: Obstetrics and Gynecology

## 2020-10-19 VITALS — BP 130/80 | HR 83 | Temp 98.8°F | Ht 63.0 in | Wt 200.2 lb

## 2020-10-19 VITALS — BP 133/94 | HR 83 | Wt 199.1 lb

## 2020-10-19 DIAGNOSIS — R269 Unspecified abnormalities of gait and mobility: Secondary | ICD-10-CM | POA: Diagnosis not present

## 2020-10-19 DIAGNOSIS — R531 Weakness: Secondary | ICD-10-CM

## 2020-10-19 DIAGNOSIS — M79661 Pain in right lower leg: Secondary | ICD-10-CM

## 2020-10-19 DIAGNOSIS — Z3042 Encounter for surveillance of injectable contraceptive: Secondary | ICD-10-CM | POA: Diagnosis not present

## 2020-10-19 DIAGNOSIS — G8929 Other chronic pain: Secondary | ICD-10-CM | POA: Diagnosis not present

## 2020-10-19 DIAGNOSIS — R296 Repeated falls: Secondary | ICD-10-CM | POA: Diagnosis not present

## 2020-10-19 DIAGNOSIS — M6740 Ganglion, unspecified site: Secondary | ICD-10-CM

## 2020-10-19 DIAGNOSIS — M25561 Pain in right knee: Secondary | ICD-10-CM

## 2020-10-19 DIAGNOSIS — M7121 Synovial cyst of popliteal space [Baker], right knee: Secondary | ICD-10-CM

## 2020-10-19 DIAGNOSIS — M25512 Pain in left shoulder: Secondary | ICD-10-CM | POA: Diagnosis not present

## 2020-10-19 DIAGNOSIS — M1711 Unilateral primary osteoarthritis, right knee: Secondary | ICD-10-CM | POA: Diagnosis not present

## 2020-10-19 DIAGNOSIS — R519 Headache, unspecified: Secondary | ICD-10-CM

## 2020-10-19 MED ORDER — MEDROXYPROGESTERONE ACETATE 150 MG/ML IM SUSP
150.0000 mg | Freq: Once | INTRAMUSCULAR | Status: AC
  Administered 2020-10-19: 150 mg via INTRAMUSCULAR

## 2020-10-19 MED ORDER — TRAMADOL HCL 50 MG PO TABS: 50.0000 mg | ORAL_TABLET | Freq: Two times a day (BID) | ORAL | 0 refills | Status: AC | PRN

## 2020-10-19 NOTE — Patient Instructions (Signed)
voltaren gel right knee and shoulder  Tylenol total mg/24 hours 4000 mg daily

## 2020-10-19 NOTE — Progress Notes (Signed)
Date last pap: 11/05/2018 Last Depo-Provera: 07/26/20 Side Effects if any: n/a Serum HCG indicated? n/a. Depo-Provera 150 mg IM given [id number] Brigido Mera Next appointment due: 01/04/2021-01/18/2021 BP elevated today in office. Attempted to repeat however, could not hear the last beat. 132/x
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
## 2020-10-22 NOTE — Telephone Encounter (Signed)
The order is fine. Please have one of the front staff or Crystal schedule this at the hospital for the patient.

## 2020-10-25 DIAGNOSIS — M25512 Pain in left shoulder: Secondary | ICD-10-CM | POA: Diagnosis not present

## 2020-10-27 DIAGNOSIS — M1711 Unilateral primary osteoarthritis, right knee: Secondary | ICD-10-CM | POA: Diagnosis not present

## 2020-10-27 DIAGNOSIS — M25561 Pain in right knee: Secondary | ICD-10-CM | POA: Diagnosis not present

## 2020-10-27 DIAGNOSIS — M25461 Effusion, right knee: Secondary | ICD-10-CM | POA: Diagnosis not present

## 2020-10-27 DIAGNOSIS — G8929 Other chronic pain: Secondary | ICD-10-CM | POA: Diagnosis not present

## 2020-10-29 DIAGNOSIS — M1711 Unilateral primary osteoarthritis, right knee: Secondary | ICD-10-CM | POA: Diagnosis not present

## 2020-11-10 DIAGNOSIS — M1711 Unilateral primary osteoarthritis, right knee: Secondary | ICD-10-CM | POA: Diagnosis not present

## 2020-11-10 DIAGNOSIS — G8929 Other chronic pain: Secondary | ICD-10-CM | POA: Diagnosis not present

## 2020-11-10 DIAGNOSIS — M25561 Pain in right knee: Secondary | ICD-10-CM | POA: Diagnosis not present

## 2020-11-10 DIAGNOSIS — M25461 Effusion, right knee: Secondary | ICD-10-CM | POA: Diagnosis not present

## 2020-11-11 ENCOUNTER — Other Ambulatory Visit: Payer: Self-pay

## 2020-11-11 ENCOUNTER — Ambulatory Visit (INDEPENDENT_AMBULATORY_CARE_PROVIDER_SITE_OTHER): Payer: Medicare Other | Admitting: Obstetrics and Gynecology

## 2020-11-11 ENCOUNTER — Encounter: Payer: Self-pay | Admitting: Obstetrics and Gynecology

## 2020-11-11 VITALS — BP 136/87 | HR 88 | Ht 63.0 in | Wt 199.3 lb

## 2020-11-11 DIAGNOSIS — Z131 Encounter for screening for diabetes mellitus: Secondary | ICD-10-CM

## 2020-11-11 DIAGNOSIS — Z01419 Encounter for gynecological examination (general) (routine) without abnormal findings: Secondary | ICD-10-CM | POA: Diagnosis not present

## 2020-11-11 MED ORDER — MEDROXYPROGESTERONE ACETATE 150 MG/ML IM SUSP: 150.0000 mg | INTRAMUSCULAR | 3 refills | Status: DC

## 2020-11-11 NOTE — Patient Instructions (Signed)
Preventive Care 40-41 Years Old, Female Preventive care refers to visits with your health care provider and lifestyle choices that can promote health and wellness. This includes:  A yearly physical exam. This may also be called an annual well check.  Regular dental visits and eye exams.  Immunizations.  Screening for certain conditions.  Healthy lifestyle choices, such as eating a healthy diet, getting regular exercise, not using drugs or products that contain nicotine and tobacco, and limiting alcohol use. What can I expect for my preventive care visit? Physical exam Your health care provider will check your:  Height and weight. This may be used to calculate body mass index (BMI), which tells if you are at a healthy weight.  Heart rate and blood pressure.  Skin for abnormal spots. Counseling Your health care provider may ask you questions about your:  Alcohol, tobacco, and drug use.  Emotional well-being.  Home and relationship well-being.  Sexual activity.  Eating habits.  Work and work environment.  Method of birth control.  Menstrual cycle.  Pregnancy history. What immunizations do I need?  Influenza (flu) vaccine  This is recommended every year. Tetanus, diphtheria, and pertussis (Tdap) vaccine  You may need a Td booster every 10 years. Varicella (chickenpox) vaccine  You may need this if you have not been vaccinated. Zoster (shingles) vaccine  You may need this after age 60. Measles, mumps, and rubella (MMR) vaccine  You may need at least one dose of MMR if you were born in 1957 or later. You may also need a second dose. Pneumococcal conjugate (PCV13) vaccine  You may need this if you have certain conditions and were not previously vaccinated. Pneumococcal polysaccharide (PPSV23) vaccine  You may need one or two doses if you smoke cigarettes or if you have certain conditions. Meningococcal conjugate (MenACWY) vaccine  You may need this if you  have certain conditions. Hepatitis A vaccine  You may need this if you have certain conditions or if you travel or work in places where you may be exposed to hepatitis A. Hepatitis B vaccine  You may need this if you have certain conditions or if you travel or work in places where you may be exposed to hepatitis B. Haemophilus influenzae type b (Hib) vaccine  You may need this if you have certain conditions. Human papillomavirus (HPV) vaccine  If recommended by your health care provider, you may need three doses over 6 months. You may receive vaccines as individual doses or as more than one vaccine together in one shot (combination vaccines). Talk with your health care provider about the risks and benefits of combination vaccines. What tests do I need? Blood tests  Lipid and cholesterol levels. These may be checked every 5 years, or more frequently if you are over 50 years old.  Hepatitis C test.  Hepatitis B test. Screening  Lung cancer screening. You may have this screening every year starting at age 55 if you have a 30-pack-year history of smoking and currently smoke or have quit within the past 15 years.  Colorectal cancer screening. All adults should have this screening starting at age 50 and continuing until age 75. Your health care provider may recommend screening at age 45 if you are at increased risk. You will have tests every 1-10 years, depending on your results and the type of screening test.  Diabetes screening. This is done by checking your blood sugar (glucose) after you have not eaten for a while (fasting). You may have this   done every 1-3 years.  Mammogram. This may be done every 1-2 years. Talk with your health care provider about when you should start having regular mammograms. This may depend on whether you have a family history of breast cancer.  BRCA-related cancer screening. This may be done if you have a family history of breast, ovarian, tubal, or peritoneal  cancers.  Pelvic exam and Pap test. This may be done every 3 years starting at age 60. Starting at age 7, this may be done every 5 years if you have a Pap test in combination with an HPV test. Other tests  Sexually transmitted disease (STD) testing.  Bone density scan. This is done to screen for osteoporosis. You may have this scan if you are at high risk for osteoporosis. Follow these instructions at home: Eating and drinking  Eat a diet that includes fresh fruits and vegetables, whole grains, lean protein, and low-fat dairy.  Take vitamin and mineral supplements as recommended by your health care provider.  Do not drink alcohol if: ? Your health care provider tells you not to drink. ? You are pregnant, may be pregnant, or are planning to become pregnant.  If you drink alcohol: ? Limit how much you have to 0-1 drink a day. ? Be aware of how much alcohol is in your drink. In the [place] one drink equals one 12 oz bottle of beer (355 mL), one 5 oz glass of wine (148 mL), or one 1 oz glass of hard liquor (44 mL). Lifestyle  Take daily care of your teeth and gums.  Stay active. Exercise for at least 30 minutes on 5 or more days each week.  Do not use any products that contain nicotine or tobacco, such as cigarettes, e-cigarettes, and chewing tobacco. If you need help quitting, ask your health care provider.  If you are sexually active, practice safe sex. Use a condom or other form of birth control (contraception) in order to prevent pregnancy and STIs (sexually transmitted infections).  If told by your health care provider, take low-dose aspirin daily starting at age 48. What's next?  Visit your health care provider once a year for a well check visit.  Ask your health care provider how often you should have your eyes and teeth checked.  Stay up to date on all vaccines. This information is not intended to replace advice given to you by your health care provider. Make sure you  discuss any questions you have with your health care provider. Document Revised: 08/08/2018 Document Reviewed: 08/08/2018 Elsevier Patient Education  2020 [place] Breast self-awareness is knowing how your breasts look and feel. Doing breast self-awareness is important. It allows you to catch a breast problem early while it is still small and can be treated. All women should do breast self-awareness, including women who have had breast implants. Tell your doctor if you notice a change in your breasts. What you need:  A mirror.  A well-lit room. How to do a breast self-exam A breast self-exam is one way to learn what is normal for your breasts and to check for changes. To do a breast self-exam: Look for changes  1. Take off all the clothes above your waist. 2. Stand in front of a mirror in a room with good lighting. 3. Put your hands on your hips. 4. Push your hands down. 5. Look at your breasts and nipples in the mirror to see if one breast or nipple looks different from the  other. Check to see if: ? The shape of one breast is different. ? The size of one breast is different. ? There are wrinkles, dips, and bumps in one breast and not the other. 6. Look at each breast for changes in the skin, such as: ? Redness. ? Scaly areas. 7. Look for changes in your nipples, such as: ? Liquid around the nipples. ? Bleeding. ? Dimpling. ? Redness. ? A change in where the nipples are. Feel for changes  1. Lie on your back on the floor. 2. Feel each breast. To do this, follow these steps: ? Pick a breast to feel. ? Put the arm closest to that breast above your head. ? Use your other arm to feel the nipple area of your breast. Feel the area with the pads of your three middle fingers by making small circles with your fingers. For the first circle, press lightly. For the second circle, press harder. For the third circle, press even harder. ? Keep making circles with  your fingers at the different pressures as you move down your breast. Stop when you feel your ribs. ? Move your fingers a little toward the center of your body. ? Start making circles with your fingers again, this time going up until you reach your collarbone. ? Keep making up-and-down circles until you reach your armpit. Remember to keep using the three pressures. ? Feel the other breast in the same way. 3. Sit or stand in the tub or shower. 4. With soapy water on your skin, feel each breast the same way you did in step 2 when you were lying on the floor. Write down what you find Writing down what you find can help you remember what to tell your doctor. Write down:  What is normal for each breast.  Any changes you find in each breast, including: ? The kind of changes you find. ? Whether you have pain. ? Size and location of any lumps.  When you last had your menstrual period. General tips  Check your breasts every month.  If you are breastfeeding, the best time to check your breasts is after you feed your baby or after you use a breast pump.  If you get menstrual periods, the best time to check your breasts is 5-7 days after your menstrual period is over.  With time, you will become comfortable with the self-exam, and you will begin to know if there are changes in your breasts. Contact a doctor if you:  See a change in the shape or size of your breasts or nipples.  See a change in the skin of your breast or nipples, such as red or scaly skin.  Have fluid coming from your nipples that is not normal.  Find a lump or thick area that was not there before.  Have pain in your breasts.  Have any concerns about your breast health. Summary  Breast self-awareness includes looking for changes in your breasts, as well as feeling for changes within your breasts.  Breast self-awareness should be done in front of a mirror in a well-lit room.  You should check your breasts every month.  If you get menstrual periods, the best time to check your breasts is 5-7 days after your menstrual period is over.  Let your doctor know of any changes you see in your breasts, including changes in size, changes on the skin, pain or tenderness, or fluid from your nipples that is not normal. This information is not  intended to replace advice given to you by your health care provider. Make sure you discuss any questions you have with your health care provider. Document Revised: 07/16/2018 Document Reviewed: 07/16/2018 Elsevier Patient Education  [place].

## 2020-11-11 NOTE — Progress Notes (Signed)
Pt present for annual exam. Pt stated that she was doing well and denied any gyn issues at this time.

## 2020-11-11 NOTE — Progress Notes (Signed)
GYNECOLOGY ANNUAL PHYSICAL EXAM PROGRESS NOTE  Subjective:    Margaret Zuniga is a 39 y.o. [place] female who presents for an annual exam. The patient has no complaints today. The patient is sexually active. The patient wears seatbelts: yes. The patient participates in regular exercise: no. Has the patient ever been transfused or tattooed?: no.  She states that she is scheduled for her bone density scan in the middle of this month for her bone density scan (due to long term Depo Provera use). She notes a history of COVID infection in September.    Gynecologic History Menarche age: 51 No LMP recorded. Patient has had an injection. Contraception: Depo-Provera injections History of STI's:  Denies Last Pap: 10/2017. Results were: normal.  Denies h/o abnormal pap smears Last mammogram: 08/2019. Results were: normal.  Scheduled for 2 weeks from now.     Upstream - 11/11/20 1419      Pregnancy Intention Screening   Does the patient want to become pregnant in the next year? No    Does the patient's partner want to become pregnant in the next year? No    Would the patient like to discuss contraceptive options today? No      Contraception Wrap Up   Current Method Hormonal Injection          The pregnancy intention screening data noted above was reviewed. Potential methods of contraception were not discussed. The patient elected to proceed with Hormonal Injection.    OB History  Gravida Para Term Preterm AB Living  0 0 0 0 0 0  SAB TAB Ectopic Multiple Live Births  0 0 0 0 0    Past Medical History:  Diagnosis Date  . Bilateral nephrolithiasis 03/24/2018  . Essential hypertension, benign 04/04/2016  . Extrinsic asthma 08/15/2016  . 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 History of kidney stones   . Hypertension   . Major depressive disorder, recurrent episode, moderate ([place]) 05/21/2013  . Obesity (BMI 30.0-34.9)  04/11/2017  . 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 Personal history of traumatic brain injury 70  . Shoulder impingement 2009   surgical relesase, Dr. Marry Guan    Past Surgical History:  Procedure Laterality Date  . ELBOW SURGERY Right 1995  . EXTRACORPOREAL SHOCK WAVE LITHOTRIPSY Left 01/22/2020   Procedure: EXTRACORPOREAL SHOCK WAVE LITHOTRIPSY (ESWL);  Surgeon: Abbie Sons, MD;  Location: [facility] ORS;  Service: Urology;  Laterality: Left;  . EYE SURGERY  1995  . KNEE ARTHROSCOPY WITH LATERAL MENISECTOMY Right 03/04/2020   Procedure: KNEE ARTHROSCOPY WITH PARTIAL LATERAL MENISECTOMY;  Surgeon: Hessie Knows, MD;  Location: [facility] ORS;  Service: Orthopedics;  Laterality: Right;  . LEG SURGERY  1985  . SHOULDER SURGERY    . SUBACROMIAL DECOMPRESSION  2000   Right shoulder, [place]  . TONSILLECTOMY  2001    Family History  Problem Relation Age of Onset  . Diabetes Mother   . Coronary artery disease Mother   . Hyperlipidemia Mother   . Hypertension Mother   . Parkinson's disease Mother   . Heart disease Maternal Grandfather   . Stroke Father     Social History   Socioeconomic History  . Marital status: Single    Spouse name: Not on file  . Number of children: Not on file  . Years of education: Not  on file  . Highest education level: Not on file  Occupational History  . Not on file  Tobacco Use  . Smoking status: Never Smoker  . Smokeless tobacco: Never Used  Vaping Use  . Vaping Use: Never used  Substance and Sexual Activity  . Alcohol use: No  . Drug use: No  . Sexual activity: Yes    Birth control/protection: Injection  Other Topics Concern  . Not on file  Social History Narrative  . Not on file   Social Determinants of Health   Financial Resource Strain: Low Risk   . Difficulty of Paying Living Expenses: Not hard at all  Food Insecurity: No Food Insecurity  . Worried About Charity fundraiser in the  Last Year: Never true  . Ran Out of Food in the Last Year: Never true  Transportation Needs: No Transportation Needs  . Lack of Transportation (Medical): No  . Lack of Transportation (Non-Medical): No  Physical Activity:   . Days of Exercise per Week: Not on file  . Minutes of Exercise per Session: Not on file  Stress: No Stress Concern Present  . Feeling of Stress : Not at all  Social Connections: Unknown  . Frequency of Communication with Friends and Family: Not on file  . Frequency of Social Gatherings with Friends and Family: More than three times a week  . Attends Religious Services: Not on file  . Active Member of Clubs or Organizations: Not on file  . Attends Archivist Meetings: Not on file  . Marital Status: Never married  Intimate Partner Violence: Not At Risk  . Fear of Current or Ex-Partner: No  . Emotionally Abused: No  . Physically Abused: No  . Sexually Abused: No    Current Outpatient Medications on File Prior to Visit  Medication Sig Dispense Refill  . Acetaminophen-Caffeine (EXCEDRIN TENSION HEADACHE) 500-65 MG TABS Take 1 tablet by mouth in the morning, at noon, and at bedtime.     Marland Kitchen albuterol (VENTOLIN HFA) 108 (90 Base) MCG/ACT inhaler TAKE 2 PUFFS BY MOUTH EVERY 6 HOURS AS NEEDED FOR WHEEZE OR SHORTNESS OF BREATH (Patient taking differently: Inhale 2 puffs into the lungs every 6 (six) hours as needed for wheezing or shortness of breath. ) 18 g 0  . amLODipine (NORVASC) 5 MG tablet TAKE 1 TABLET BY MOUTH EVERY DAY 90 tablet 1  . baclofen (LIORESAL) 10 MG tablet Take 10 mg by mouth See admin instructions. Take 10 mg by mouth twice daily on Wednesday and Saturday    . carbamazepine (CARBATROL) 300 MG 12 hr capsule Take 300 mg by mouth in the morning, at noon, and at bedtime.   1  . cetirizine (ZYRTEC) 10 MG tablet Take 10 mg by mouth daily.    . diphenhydrAMINE HCl, Sleep, (ZZZQUIL PO) Take by mouth at bedtime.    Marland Kitchen escitalopram (LEXAPRO) 10 MG tablet  TAKE 1 TABLET BY MOUTH EVERY DAY 90 tablet 1  . fluticasone furoate-vilanterol (BREO ELLIPTA) 100-25 MCG/INH AEPB Inhale into the lungs.    . meloxicam (MOBIC) 15 MG tablet TAKE 1 TABLET BY MOUTH EVERY DAY 30 tablet 5  . metoprolol succinate (TOPROL-XL) 100 MG 24 hr tablet TAKE 1 TABLET (100 MG TOTAL) BY MOUTH DAILY. TAKE WITH OR IMMEDIATELY FOLLOWING A MEAL. 90 tablet 1  . Multiple Vitamins-Minerals (CENTRUM WOMEN PO) Take by mouth daily in the afternoon.     No current facility-administered medications on file prior to visit.  Allergies  Allergen Reactions  . Zonegran [Zonisamide] Rash     Review of Systems Constitutional: negative for chills, fatigue, fevers and sweats Eyes: negative for irritation, redness and visual disturbance Ears, nose, mouth, throat, and face: negative for hearing loss, nasal congestion, snoring and tinnitus Respiratory: negative for asthma, cough, sputum Cardiovascular: negative for chest pain, dyspnea, exertional chest pressure/discomfort, irregular heart beat, palpitations and syncope Gastrointestinal: negative for abdominal pain, change in bowel habits, nausea and vomiting Genitourinary: negative for abnormal menstrual periods, genital lesions, sexual problems and vaginal discharge, dysuria and urinary incontinence Integument/breast: negative for breast lump, breast tenderness and nipple discharge Hematologic/lymphatic: negative for bleeding and easy bruising Musculoskeletal:negative for back pain and muscle weakness Neurological: negative for dizziness, headaches, vertigo and weakness Endocrine: negative for diabetic symptoms including polydipsia, polyuria and skin dryness Allergic/Immunologic: negative for hay fever and urticaria        Objective:  Blood pressure 136/87, pulse 88, height 5\' 3"  (1.6 m), weight 199 lb 4.8 oz (90.4 kg). Body mass index is 35.3 kg/m.  General Appearance:    Alert, cooperative, no distress, appears stated age,  moderately obese  Head:    Normocephalic, without obvious abnormality, atraumatic  Eyes:    PERRL, conjunctiva/corneas clear, EOM's intact, both eyes  Ears:    Normal external ear canals, both ears  Nose:   Nares normal, septum midline, mucosa normal, no drainage or sinus tenderness  Throat:   Lips, mucosa, and tongue normal; teeth and gums normal  Neck:   Supple, symmetrical, trachea midline, no adenopathy; thyroid: no enlargement/tenderness/nodules; no carotid bruit or JVD  Back:     Symmetric, no curvature, ROM normal, no CVA tenderness  Lungs:     Clear to auscultation bilaterally, respirations unlabored  Chest Wall:    No tenderness or deformity   Heart:    Regular rate and rhythm, S1 and S2 normal, no murmur, rub or gallop  Breast Exam:    No tenderness, masses, or nipple abnormality  Abdomen:     Soft, non-tender, bowel sounds active all four quadrants, no masses, no organomegaly.    Genitalia:    Pelvic:external genitalia normal, vagina without lesions, discharge, or tenderness, rectovaginal septum  normal. Cervix normal in appearance, no cervical motion tenderness, no adnexal masses or tenderness.  Uterus normal size, shape, mobile, regular contours, nontender.  Rectal:    Normal external sphincter.  No hemorrhoids appreciated. Internal exam not done.   Extremities:   Extremities normal, atraumatic, no cyanosis or edema  Pulses:   2+ and symmetric all extremities  Skin:   Skin color, texture, turgor normal, no rashes or lesions  Lymph nodes:   Cervical, supraclavicular, and axillary nodes normal  Neurologic:  Grossly intact.  Partial paralysis of left side (face, upper and lower limb)   .  Labs:  Lab Results  Component Value Date   WBC 9.7 01/14/2020   HGB 13.4 01/14/2020   HCT 40.3 01/14/2020   MCV 91.4 01/14/2020   PLT 300 01/14/2020    Lab Results  Component Value Date   CREATININE 0.88 01/14/2020   BUN 14 01/14/2020   NA 142 01/14/2020   K 3.6 01/14/2020   CL 110  01/14/2020   CO2 25 01/14/2020    Lab Results  Component Value Date   ALT 22 01/14/2020   AST 18 01/14/2020   ALKPHOS 87 01/14/2020   BILITOT 0.5 01/14/2020    Lab Results  Component Value Date   TSH 2.88 10/08/2017  Lab Results  Component Value Date   HGBA1C 5.3 05/15/2019   Lab Results  Component Value Date   CHOL 181 05/15/2019   HDL 57.70 05/15/2019   LDLCALC 103 (H) 05/15/2019   LDLDIRECT 109.0 04/17/2016   TRIG 102.0 05/15/2019   CHOLHDL 3 05/15/2019     Assessment:   Healthy female exam.  Moderate obesity  HTN Depo Provera contraception   Plan:    Labs: up to date, performed by PCP Breast self exam technique reviewed and patient encouraged to perform self-exam monthly. Contraception: Depo-Provera injections.  For Dexa Scan this year as she desires to continue Depo Provera to assess bone density.   Otherwise no concerns with medication.  Discussed healthy lifestyle modifications. Mammogram: scheduled.  Pap smear performed today.  Continue routine screening HTN  management as recommended by PCP.  Up to date on flu vaccine.  COVID vaccination status: completed.  Can receive booster when eligible.  Follow up in 1 year for annual exam.    Rubie Maid, MD [facility]

## 2020-11-12 LAB — HEMOGLOBIN A1C
Est. average glucose Bld gHb Est-mCnc: 100 mg/dL
Hgb A1c MFr Bld: 5.1 % (ref 4.8–5.6)

## 2020-11-12 LAB — TSH: TSH: 1.77 u[IU]/mL (ref 0.450–4.500)

## 2020-11-17 DIAGNOSIS — M25461 Effusion, right knee: Secondary | ICD-10-CM | POA: Diagnosis not present

## 2020-11-17 DIAGNOSIS — M25561 Pain in right knee: Secondary | ICD-10-CM | POA: Diagnosis not present

## 2020-11-17 DIAGNOSIS — G8929 Other chronic pain: Secondary | ICD-10-CM | POA: Diagnosis not present

## 2020-11-17 DIAGNOSIS — M1711 Unilateral primary osteoarthritis, right knee: Secondary | ICD-10-CM | POA: Diagnosis not present

## 2020-11-23 DIAGNOSIS — M791 Myalgia, unspecified site: Secondary | ICD-10-CM | POA: Diagnosis not present

## 2020-11-23 DIAGNOSIS — G43719 Chronic migraine without aura, intractable, without status migrainosus: Secondary | ICD-10-CM | POA: Diagnosis not present

## 2020-11-23 DIAGNOSIS — M542 Cervicalgia: Secondary | ICD-10-CM | POA: Diagnosis not present

## 2020-11-24 ENCOUNTER — Other Ambulatory Visit: Payer: Self-pay

## 2020-11-24 ENCOUNTER — Ambulatory Visit
Admission: RE | Admit: 2020-11-24 | Discharge: 2020-11-24 | Disposition: A | Discharge status: Home / Self Care | Payer: Medicare Other | Source: Ambulatory Visit | Attending: Internal Medicine | Admitting: Internal Medicine

## 2020-11-24 DIAGNOSIS — Z1231 Encounter for screening mammogram for malignant neoplasm of breast: Secondary | ICD-10-CM | POA: Insufficient documentation

## 2020-11-26 ENCOUNTER — Other Ambulatory Visit: Payer: Self-pay | Admitting: Internal Medicine

## 2020-11-29 ENCOUNTER — Other Ambulatory Visit: Payer: Self-pay

## 2020-11-29 ENCOUNTER — Ambulatory Visit
Admission: RE | Admit: 2020-11-29 | Discharge: 2020-11-29 | Disposition: A | Discharge status: Home / Self Care | Payer: Medicare Other | Source: Ambulatory Visit | Attending: Obstetrics and Gynecology | Admitting: Obstetrics and Gynecology

## 2020-11-29 DIAGNOSIS — Z13820 Encounter for screening for osteoporosis: Secondary | ICD-10-CM | POA: Diagnosis not present

## 2020-11-29 DIAGNOSIS — Z79899 Other long term (current) drug therapy: Secondary | ICD-10-CM | POA: Insufficient documentation

## 2020-11-29 DIAGNOSIS — Z780 Asymptomatic menopausal state: Secondary | ICD-10-CM | POA: Diagnosis not present

## 2020-11-29 DIAGNOSIS — Z3042 Encounter for surveillance of injectable contraceptive: Secondary | ICD-10-CM

## 2020-12-01 DIAGNOSIS — M25461 Effusion, right knee: Secondary | ICD-10-CM | POA: Diagnosis not present

## 2020-12-01 DIAGNOSIS — M1711 Unilateral primary osteoarthritis, right knee: Secondary | ICD-10-CM | POA: Diagnosis not present

## 2020-12-01 DIAGNOSIS — G8929 Other chronic pain: Secondary | ICD-10-CM | POA: Diagnosis not present

## 2020-12-01 DIAGNOSIS — M25561 Pain in right knee: Secondary | ICD-10-CM | POA: Diagnosis not present

## 2020-12-08 DIAGNOSIS — G8929 Other chronic pain: Secondary | ICD-10-CM | POA: Diagnosis not present

## 2020-12-08 DIAGNOSIS — M25461 Effusion, right knee: Secondary | ICD-10-CM | POA: Diagnosis not present

## 2020-12-08 DIAGNOSIS — M25561 Pain in right knee: Secondary | ICD-10-CM | POA: Diagnosis not present

## 2020-12-08 DIAGNOSIS — M1711 Unilateral primary osteoarthritis, right knee: Secondary | ICD-10-CM | POA: Diagnosis not present

## 2020-12-16 DIAGNOSIS — R0609 Other forms of dyspnea: Secondary | ICD-10-CM | POA: Diagnosis not present

## 2020-12-16 DIAGNOSIS — J45909 Unspecified asthma, uncomplicated: Secondary | ICD-10-CM | POA: Diagnosis not present

## 2020-12-22 DIAGNOSIS — G43719 Chronic migraine without aura, intractable, without status migrainosus: Secondary | ICD-10-CM | POA: Diagnosis not present

## 2021-01-04 ENCOUNTER — Ambulatory Visit (INDEPENDENT_AMBULATORY_CARE_PROVIDER_SITE_OTHER): Payer: Medicare Other | Admitting: Obstetrics and Gynecology

## 2021-01-04 ENCOUNTER — Other Ambulatory Visit: Payer: Self-pay

## 2021-01-04 DIAGNOSIS — Z3042 Encounter for surveillance of injectable contraceptive: Secondary | ICD-10-CM

## 2021-01-05 MED ORDER — MEDROXYPROGESTERONE ACETATE 150 MG/ML IM SUSP
150.0000 mg | Freq: Once | INTRAMUSCULAR | Status: AC
  Administered 2021-01-05: 150 mg via INTRAMUSCULAR

## 2021-01-05 NOTE — Progress Notes (Unsigned)
Date last pap: 11/05/2018 Last Depo-Provera: 10/19/2020 Side Effects if any: none Serum HCG indicated? n/a Depo-Provera 150 mg IM given by: Lovell Sheehan, LPN Next appointment due April 12-26, 2022

## 2021-01-06 ENCOUNTER — Encounter: Payer: Self-pay | Admitting: Obstetrics and Gynecology

## 2021-01-06 DIAGNOSIS — G43719 Chronic migraine without aura, intractable, without status migrainosus: Secondary | ICD-10-CM | POA: Diagnosis not present

## 2021-01-06 DIAGNOSIS — M791 Myalgia, unspecified site: Secondary | ICD-10-CM | POA: Diagnosis not present

## 2021-01-06 DIAGNOSIS — M542 Cervicalgia: Secondary | ICD-10-CM | POA: Diagnosis not present

## 2021-01-12 ENCOUNTER — Ambulatory Visit: Payer: Medicare Other | Admitting: Orthotics

## 2021-01-12 ENCOUNTER — Other Ambulatory Visit: Payer: Self-pay

## 2021-01-12 DIAGNOSIS — M216X1 Other acquired deformities of right foot: Secondary | ICD-10-CM

## 2021-01-12 DIAGNOSIS — M216X2 Other acquired deformities of left foot: Secondary | ICD-10-CM

## 2021-01-12 NOTE — Progress Notes (Signed)
S[place] brace back to [place] to replace velcro upper straps with eyelets lace.

## 2021-01-18 ENCOUNTER — Telehealth: Payer: Self-pay | Admitting: Podiatry

## 2021-01-18 NOTE — Telephone Encounter (Signed)
Pt called and left message late yesterday afternoon asking about the brace that Liliane Channel is to be working on. She was not sure if she needs an appt to pick it up or if she can just come in to pick up.  After checking with Liliane Channel she will need and appt but he is not expecting it back for a few more weeks as we had an issue with [employer] picking up in a timely manner. I told pt I would call her when it comes in to schedule an appt.

## 2021-02-15 DIAGNOSIS — M791 Myalgia, unspecified site: Secondary | ICD-10-CM | POA: Diagnosis not present

## 2021-02-15 DIAGNOSIS — M542 Cervicalgia: Secondary | ICD-10-CM | POA: Diagnosis not present

## 2021-02-15 DIAGNOSIS — G43719 Chronic migraine without aura, intractable, without status migrainosus: Secondary | ICD-10-CM | POA: Diagnosis not present

## 2021-02-16 ENCOUNTER — Other Ambulatory Visit: Payer: Medicare Other

## 2021-02-16 ENCOUNTER — Other Ambulatory Visit: Payer: Self-pay

## 2021-02-21 ENCOUNTER — Other Ambulatory Visit: Payer: Self-pay | Admitting: Internal Medicine

## 2021-03-22 DIAGNOSIS — G43719 Chronic migraine without aura, intractable, without status migrainosus: Secondary | ICD-10-CM | POA: Diagnosis not present

## 2021-03-22 DIAGNOSIS — M542 Cervicalgia: Secondary | ICD-10-CM | POA: Diagnosis not present

## 2021-03-22 DIAGNOSIS — M791 Myalgia, unspecified site: Secondary | ICD-10-CM | POA: Diagnosis not present

## 2021-03-24 ENCOUNTER — Ambulatory Visit: Payer: Medicare Other

## 2021-03-31 ENCOUNTER — Ambulatory Visit: Payer: Medicare Other

## 2021-04-01 ENCOUNTER — Ambulatory Visit (INDEPENDENT_AMBULATORY_CARE_PROVIDER_SITE_OTHER): Payer: Medicare Other | Admitting: Surgical

## 2021-04-01 ENCOUNTER — Other Ambulatory Visit: Payer: Self-pay

## 2021-04-01 DIAGNOSIS — Z3042 Encounter for surveillance of injectable contraceptive: Secondary | ICD-10-CM | POA: Diagnosis not present

## 2021-04-01 MED ORDER — MEDROXYPROGESTERONE ACETATE 150 MG/ML IM SUSP
150.0000 mg | Freq: Once | INTRAMUSCULAR | Status: AC
  Administered 2021-04-01: 150 mg via INTRAMUSCULAR

## 2021-04-01 NOTE — Progress Notes (Signed)
Date last pap: 11/05/2018 Last Depo-Provera: 01/04/2021 Side Effects if any: NONE Serum HCG indicated? NA Depo-Provera 150 mg IM given by: [facility]. [facility] CMA Next appointment due July 8-22

## 2021-05-10 DIAGNOSIS — M791 Myalgia, unspecified site: Secondary | ICD-10-CM | POA: Diagnosis not present

## 2021-05-10 DIAGNOSIS — M542 Cervicalgia: Secondary | ICD-10-CM | POA: Diagnosis not present

## 2021-05-10 DIAGNOSIS — G43719 Chronic migraine without aura, intractable, without status migrainosus: Secondary | ICD-10-CM | POA: Diagnosis not present

## 2021-05-20 ENCOUNTER — Other Ambulatory Visit: Payer: Self-pay | Admitting: Internal Medicine

## 2021-05-23 ENCOUNTER — Encounter: Payer: Self-pay | Admitting: Adult Health

## 2021-05-23 ENCOUNTER — Other Ambulatory Visit: Payer: Self-pay

## 2021-05-23 ENCOUNTER — Ambulatory Visit (INDEPENDENT_AMBULATORY_CARE_PROVIDER_SITE_OTHER): Payer: Medicare Other | Admitting: Adult Health

## 2021-05-23 ENCOUNTER — Ambulatory Visit (INDEPENDENT_AMBULATORY_CARE_PROVIDER_SITE_OTHER): Payer: Medicare Other

## 2021-05-23 VITALS — BP 136/88 | HR 88 | Temp 98.2°F | Ht 62.99 in | Wt 205.2 lb

## 2021-05-23 DIAGNOSIS — R109 Unspecified abdominal pain: Secondary | ICD-10-CM

## 2021-05-23 DIAGNOSIS — M25551 Pain in right hip: Secondary | ICD-10-CM | POA: Diagnosis not present

## 2021-05-23 DIAGNOSIS — M62838 Other muscle spasm: Secondary | ICD-10-CM

## 2021-05-23 LAB — COMPREHENSIVE METABOLIC PANEL
ALT: 20 U/L (ref 0–35)
AST: 16 U/L (ref 0–37)
Albumin: 4.8 g/dL (ref 3.5–5.2)
Alkaline Phosphatase: 78 U/L (ref 39–117)
BUN: 13 mg/dL (ref 6–23)
CO2: 25 mEq/L (ref 19–32)
Calcium: 9.8 mg/dL (ref 8.4–10.5)
Chloride: 106 mEq/L (ref 96–112)
Creatinine, Ser: 0.78 mg/dL (ref 0.40–1.20)
GFR: 93.68 mL/min (ref >60.00)
Glucose, Bld: 82 mg/dL (ref 70–99)
Potassium: 4.2 mEq/L (ref 3.5–5.1)
Sodium: 141 mEq/L (ref 135–145)
Total Bilirubin: 0.4 mg/dL (ref 0.2–1.2)
Total Protein: 7.2 g/dL (ref 6.0–8.3)

## 2021-05-23 LAB — CBC WITH DIFFERENTIAL/PLATELET
Basophils Absolute: 0 10*3/uL (ref 0.0–0.1)
Basophils Relative: 0.3 % (ref 0.0–3.0)
Eosinophils Absolute: 0.1 10*3/uL (ref 0.0–0.7)
Eosinophils Relative: 2 % (ref 0.0–5.0)
HCT: 42.1 % (ref 36.0–46.0)
Hemoglobin: 13.9 g/dL (ref 12.0–15.0)
Lymphocytes Relative: 26.7 % (ref 12.0–46.0)
Lymphs Abs: 1.8 10*3/uL (ref 0.7–4.0)
MCHC: 33 g/dL (ref 30.0–36.0)
MCV: 92.8 fl (ref 78.0–100.0)
Monocytes Absolute: 0.5 10*3/uL (ref 0.1–1.0)
Monocytes Relative: 7 % (ref 3.0–12.0)
Neutro Abs: 4.3 10*3/uL (ref 1.4–7.7)
Neutrophils Relative %: 64 % (ref 43.0–77.0)
Platelets: 315 10*3/uL (ref 150.0–400.0)
RBC: 4.54 Mil/uL (ref 3.87–5.11)
RDW: 13.5 % (ref 11.5–15.5)
WBC: 6.7 10*3/uL (ref 4.0–10.5)

## 2021-05-23 LAB — URINALYSIS, MICROSCOPIC ONLY: RBC / HPF: NONE SEEN (ref 0–?)

## 2021-05-23 MED ORDER — CYCLOBENZAPRINE HCL 5 MG PO TABS: 5.0000 mg | ORAL_TABLET | Freq: Three times a day (TID) | ORAL | 1 refills | Status: DC | PRN

## 2021-05-23 NOTE — Progress Notes (Addendum)
Acute Office Visit  Subjective:    Patient ID: Margaret Zuniga, female    DOB: [birth year], 40 y.o.   MRN: [id number]  Chief Complaint  Patient presents with   Hip Pain    Pt states she has rt hip pain that has been going on for about a week. Pt states she has not been seen anywhere else.     HPI Patient is in today for pain right hip pain/ flank pain.  Denies any urinary symptoms. Started 1 week ago.  She has a history of kidney stones last February.  Denies any injury  Bowels are within normal she reports. She denies any urinary concerns, constipation, dark tarry or bloody stools or any hematuria.   Patient  denies any fever, body aches,chills, rash, chest pain, shortness of breath, nausea, vomiting, or diarrhea. Denies dizziness, lightheadedness, pre syncopal or syncopal episodes.    No LMP recorded. Patient has had an injection. She is on Depo injection.   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 Marry Guan    Past Surgical History:  Procedure Laterality Date   ELBOW SURGERY Right [place] LITHOTRIPSY Left 01/22/2020   Procedure: EXTRACORPOREAL SHOCK WAVE LITHOTRIPSY (ESWL);  Surgeon: Abbie Sons, MD;  Location: [facility] OR[facility];  Service: Urology;  Laterality: Left;   EYE SURGERY  1995   KNEE ARTHROSCOPY WITH LATERAL MENISECTOMY Right 03/04/2020   Procedure: KNEE ARTHROSCOPY WITH PARTIAL LATERAL  MENISECTOMY;  Surgeon: Hessie Knows, MD;  Location: [facility] OR[facility];  Service: Orthopedics;  Laterality: Right;   LEG SURGERY  1985   SHOULDER SURGERY     SUBACROMIAL DECOMPRESSION  2000   Right shoulder, [place]   TONSILLECTOMY  2001    Family History  Problem Relation Age of Onset   Diabetes Mother    Coronary artery disease Mother    Hyperlipidemia Mother    Hypertension Mother    Parkinson's disease Mother    Heart disease Maternal Grandfather    Stroke Father     Social History   Socioeconomic History   Marital status: Single    Spouse name: Not on file   Number of children: Not on file   Years of education: Not on file   Highest education level: Not on file  Occupational History   Not on file  Tobacco Use   Smoking status: Never   Smokeless tobacco: Never  Vaping Use   Vaping Use: Never used  Substance and Sexual Activity   Alcohol use: No   Drug use: No   Sexual activity: Yes    Birth control/protection: Injection  Other Topics Concern   Not on file  Social History Narrative   Not on file   Social Determinants of Health   Financial Resource Strain: Low Risk    Difficulty of Paying Living Expenses: Not hard at all  Food Insecurity: No Food Insecurity   Worried About  Running Out of Food in the Last Year: Never true   Ran Out of Food in the Last Year: Never true  Transportation Needs: No Transportation Needs   Lack of Transportation (Medical): No   Lack of Transportation (Non-Medical): No  Physical Activity: Not on file  Stress: No Stress Concern Present   Feeling of Stress : Not at all  Social Connections: Unknown   Frequency of Communication with Friends and Family: Not on file   Frequency of Social Gatherings with Friends and Family: More than three times a week   Attends Religious Services: Not on Electrical engineer or Organizations: Not on file   Attends Archivist Meetings: Not on file   Marital Status: Never married  Intimate  Partner Violence: Not At Risk   Fear of Current or Ex-Partner: No   Emotionally Abused: No   Physically Abused: No   Sexually Abused: No    Outpatient Medications Prior to Visit  Medication Sig Dispense Refill   Acetaminophen-Caffeine 500-65 MG TABS Take 1 tablet by mouth in the morning, at noon, and at bedtime.      albuterol (VENTOLIN HFA) 108 (90 Base) MCG/ACT inhaler TAKE 2 PUFFS BY MOUTH EVERY 6 HOURS AS NEEDED FOR WHEEZE OR SHORTNESS OF BREATH (Patient taking differently: Inhale 2 puffs into the lungs every 6 (six) hours as needed for wheezing or shortness of breath.) 18 g 0   baclofen (LIORESAL) 10 MG tablet Take 10 mg by mouth See admin instructions. Take 10 mg by mouth twice daily on Wednesday and Saturday     carbamazepine (CARBATROL) 300 MG 12 hr capsule Take 300 mg by mouth in the morning, at noon, and at bedtime.   1   cetirizine (ZYRTEC) 10 MG tablet Take 10 mg by mouth daily.     diphenhydrAMINE HCl, Sleep, (ZZZQUIL PO) Take by mouth at bedtime.     medroxyPROGESTERone (DEPO-PROVERA) 150 MG/ML injection Inject 1 mL (150 mg total) into the muscle every 3 (three) months. 1 mL 3   meloxicam (MOBIC) 15 MG tablet TAKE 1 TABLET BY MOUTH EVERY DAY 30 tablet 5   Multiple Vitamins-Minerals (CENTRUM WOMEN PO) Take by mouth daily in the afternoon.     amLODipine (NORVASC) 5 MG tablet TAKE 1 TABLET BY MOUTH EVERY DAY 90 tablet 0   escitalopram (LEXAPRO) 10 MG tablet TAKE 1 TABLET BY MOUTH EVERY DAY 90 tablet 0   metoprolol succinate (TOPROL-XL) 100 MG 24 hr tablet TAKE 1 TABLET (100 MG TOTAL) BY MOUTH DAILY. TAKE WITH OR IMMEDIATELY FOLLOWING A MEAL. 90 tablet 0   fluticasone furoate-vilanterol (BREO ELLIPTA) 100-25 MCG/INH AEPB Inhale into the lungs. (Patient not taking: Reported on 05/23/2021)     No facility-administered medications prior to visit.    Allergies  Allergen Reactions   Zonegran [Zonisamide] Rash    Review of Systems  Constitutional: Negative.   HENT: Negative.     Respiratory: Negative.    Cardiovascular: Negative.   Gastrointestinal:  Positive for abdominal pain. Negative for abdominal distention, anal bleeding, blood in stool, constipation, diarrhea, nausea, rectal pain and vomiting.  Genitourinary: Negative.   Musculoskeletal:  Positive for arthralgias. Negative for back pain, gait problem, joint swelling, myalgias, neck pain and neck stiffness.  Skin: Negative.   Neurological: Negative.   Psychiatric/Behavioral: Negative.        Objective:    Physical Exam Vitals reviewed.  Constitutional:      General: She is not in acute distress.  Appearance: She is well-developed. She is not ill-appearing, toxic-appearing or diaphoretic.     Interventions: She is not intubated. HENT:     Head: Normocephalic and atraumatic.     Right Ear: External ear normal.     Left Ear: External ear normal.     Nose: Nose normal.     Mouth/Throat:     Pharynx: No oropharyngeal exudate.  Eyes:     General: Lids are normal. No scleral icterus.       Right eye: No discharge.        Left eye: No discharge.     Conjunctiva/sclera: Conjunctivae normal.     Right eye: Right conjunctiva is not injected. No exudate or hemorrhage.    Left eye: Left conjunctiva is not injected. No exudate or hemorrhage.    Pupils: Pupils are equal, round, and reactive to light.  Neck:     Thyroid: No thyroid mass or thyromegaly.     Vascular: Normal carotid pulses. No carotid bruit, hepatojugular reflux or JVD.     Trachea: Trachea and phonation normal. No tracheal tenderness or tracheal deviation.     Meningeal: Brudzinski's sign and Kernig's sign absent.  Cardiovascular:     Rate and Rhythm: Normal rate and regular rhythm.     Pulses: Normal pulses.          Radial pulses are 2+ on the right side and 2+ on the left side.       Dorsalis pedis pulses are 2+ on the right side and 2+ on the left side.       Posterior tibial pulses are 2+ on the right side and 2+ on the left side.      Heart sounds: Normal heart sounds, S1 normal and S2 normal. Heart sounds not distant. No murmur heard.   No friction rub. No gallop.  Pulmonary:     Effort: Pulmonary effort is normal. No tachypnea, bradypnea, accessory muscle usage or respiratory distress. She is not intubated.     Breath sounds: Normal breath sounds. No stridor. No wheezing or rales.  Chest:     Chest wall: No tenderness.  Breasts:    Right: No supraclavicular adenopathy.     Left: No supraclavicular adenopathy.  Abdominal:     General: Bowel sounds are normal. There is no distension or abdominal bruit.     Palpations: Abdomen is soft. There is no shifting dullness, fluid wave, hepatomegaly, splenomegaly, mass or pulsatile mass.     Tenderness: There is no abdominal tenderness. There is no guarding or rebound.     Hernia: No hernia is present.       Comments: Mild tenderness pelvic no significant pain in any area with palpation deep or light.   Musculoskeletal:        General: No deformity. Normal range of motion.     Cervical back: Normal, full passive range of motion without pain, normal range of motion and neck supple. No edema, erythema or rigidity. No spinous process tenderness or muscular tenderness. Normal range of motion.     Thoracic back: Normal.     Lumbar back: Spasms and tenderness present. Normal range of motion.       Back:     Comments: Chronic right sided weakness no change per patient.  Lumbar sacral spasm right side noted.   No bilateral CVA tenderness.   Lymphadenopathy:     Head:     Right side of head: No submental, submandibular, tonsillar, preauricular, posterior auricular or  occipital adenopathy.     Left side of head: No submental, submandibular, tonsillar, preauricular, posterior auricular or occipital adenopathy.     Cervical: No cervical adenopathy.     Right cervical: No superficial, deep or posterior cervical adenopathy.    Left cervical: No superficial, deep or posterior cervical  adenopathy.     Upper Body:     Right upper body: No supraclavicular or pectoral adenopathy.     Left upper body: No supraclavicular or pectoral adenopathy.  Skin:    General: Skin is warm and dry.     Coloration: Skin is not pale.     Findings: No abrasion, bruising, burn, ecchymosis, erythema, lesion, petechiae or rash.     Nails: There is no clubbing.  Neurological:     Mental Status: She is alert and oriented to person, place, and time.     GCS: GCS eye subscore is 4. GCS verbal subscore is 5. GCS motor subscore is 6.     Cranial Nerves: No cranial nerve deficit.     Sensory: No sensory deficit.     Motor: No tremor, atrophy, abnormal muscle tone or seizure activity.     Coordination: Coordination normal.     Gait: Gait normal.     Deep Tendon Reflexes: Reflexes are normal and symmetric. Reflexes normal. Babinski sign absent on the right side. Babinski sign absent on the left side.     Reflex Scores:      Tricep reflexes are 2+ on the right side and 2+ on the left side.      Bicep reflexes are 2+ on the right side and 2+ on the left side.      Brachioradialis reflexes are 2+ on the right side and 2+ on the left side.      Patellar reflexes are 2+ on the right side and 2+ on the left side.      Achilles reflexes are 2+ on the right side and 2+ on the left side. Psychiatric:        Speech: Speech normal.        Behavior: Behavior normal.        Thought Content: Thought content normal.        Judgment: Judgment normal.    BP 136/88   Pulse 88   Temp 98.2 F (36.8 C)   Ht 5' 2.99" (1.6 m)   Wt 205 lb 3.2 oz (93.1 kg)   SpO2 98%   BMI 36.36 kg/m  Wt Readings from Last 3 Encounters:  05/23/21 205 lb 3.2 oz (93.1 kg)  11/11/20 199 lb 4.8 oz (90.4 kg)  10/19/20 199 lb 1.6 oz (90.3 kg)    There are no preventive care reminders to display for this patient.  There are no preventive care reminders to display for this patient.   Lab Results  Component Value Date   TSH  1.770 11/11/2020   Lab Results  Component Value Date   WBC 6.7 05/23/2021   HGB 13.9 05/23/2021   HCT 42.1 05/23/2021   MCV 92.8 05/23/2021   PLT 315.0 05/23/2021   Lab Results  Component Value Date   NA 141 05/23/2021   K 4.2 05/23/2021   CO2 25 05/23/2021   GLUCOSE 82 05/23/2021   BUN 13 05/23/2021   CREATININE 0.78 05/23/2021   BILITOT 0.4 05/23/2021   ALKPHOS 78 05/23/2021   AST 16 05/23/2021   ALT 20 05/23/2021   PROT 7.2 05/23/2021   ALBUMIN 4.8 05/23/2021   CALCIUM  9.8 05/23/2021   ANIONGAP 7 01/14/2020   GFR 93.68 05/23/2021   Lab Results  Component Value Date   CHOL 181 05/15/2019   Lab Results  Component Value Date   HDL 57.70 05/15/2019   Lab Results  Component Value Date   LDLCALC 103 (H) 05/15/2019   Lab Results  Component Value Date   TRIG 102.0 05/15/2019   Lab Results  Component Value Date   CHOLHDL 3 05/15/2019   Lab Results  Component Value Date   HGBA1C 5.1 11/11/2020       Assessment & Plan:   Problem List Items Addressed This Visit   None Visit Diagnoses     Flank pain, acute    -  Primary   Relevant Orders   CBC with Differential/Platelet (Completed)   Urinalysis, microscopic only (Completed)   CULTURE, URINE COMPREHENSIVE   Comprehensive metabolic panel (Completed)   DG Hip Unilat W OR W/O Pelvis 2-3 Views Right (Completed)   DG Abd 1 View (Completed)   Muscle spasm       Relevant Medications   cyclobenzaprine (FLEXERIL) 5 MG tablet        Meds ordered this encounter  Medications   cyclobenzaprine (FLEXERIL) 5 MG tablet    Sig: Take 1 tablet (5 mg total) by mouth 3 (three) times daily as needed for muscle spasms.    Dispense:  30 tablet    Refill:  1   Orders Placed This Encounter  Procedures   CULTURE, URINE COMPREHENSIVE   DG Hip Unilat W OR W/O Pelvis 2-3 Views Right   DG Abd 1 View   CBC with Differential/Platelet   Urinalysis, microscopic only   Comprehensive metabolic panel     Rule out kidney  stone / constipation given history and since she is reporting hip pain will do x ray of right hip.   Send urine.  She can try flexeril  for muscle spasm.   Medications Discontinued During This Encounter  Medication Reason   baclofen (LIORESAL) 10 MG tablet Completed Course     Return in about 2 weeks (around 06/06/2021), or if symptoms worsen or fail to improve, for at any time for any worsening symptoms, Go to Emergency room/ urgent care if worse.  Wellington [place] Miosotis Wetsel, FNP  Red Flags discussed. The patient was given clear instructions to go to ER or return to medical center if any red flags develop, symptoms do not improve, worsen or new problems develop. They verbalized understanding.

## 2021-05-23 NOTE — Patient Instructions (Signed)
Flank Pain, Adult Flank pain is pain in your side. The flank is the area of your side between your upper belly (abdomen) and your back. The pain may occur over a short time (acute), or it may be long-term or come back often (chronic). It may be mild or very bad. Pain in this area can be caused by manydifferent things. Follow these instructions at home:  Drink enough fluid to keep your pee (urine) clear or pale yellow. Rest as told by your doctor. Take over-the-counter and prescription medicines only as told by your doctor. Keep a journal to keep track of: What has caused your flank pain. What has made it feel better. Keep all follow-up visits as told by your doctor. This is important. Contact a doctor if: Medicine does not help your pain. You have new symptoms. Your pain gets worse. You have a fever. Your symptoms last longer than 2-3 days. You have trouble peeing. You are peeing more often than normal. Get help right away if: You have trouble breathing. You are short of breath. Your belly hurts, or it is swollen or red. You feel sick to your stomach (nauseous). You throw up (vomit). You feel like you will pass out, or you do pass out (faint). You have blood in your pee. Summary Flank pain is pain in your side. The flank is the area of your side between your upper belly (abdomen) and your back. Flank pain may occur over a short time (acute), or it may be long-term or come back often (chronic). It may be mild or very bad. Pain in this area can be caused by many different things. Contact your doctor if your symptoms get worse or they last longer than 2-3 days. This information is not intended to replace advice given to you by your health care provider. Make sure you discuss any questions you have with your healthcare provider. Document Revised: 08/17/2020 Document Reviewed: 08/20/2020 Elsevier Patient Education  2022 [employer].

## 2021-05-24 ENCOUNTER — Other Ambulatory Visit: Payer: Self-pay | Admitting: Adult Health

## 2021-05-24 ENCOUNTER — Encounter: Payer: Self-pay | Admitting: Adult Health

## 2021-05-24 MED ORDER — CEPHALEXIN 500 MG PO CAPS: 500.0000 mg | ORAL_CAPSULE | Freq: Three times a day (TID) | ORAL | 0 refills | Status: DC

## 2021-05-24 NOTE — Progress Notes (Signed)
Meds ordered this encounter  Medications   cephALEXin (KEFLEX) 500 MG capsule    Sig: Take 1 capsule (500 mg total) by mouth 3 (three) times daily for 7 days.    Dispense:  21 capsule    Refill:  0

## 2021-05-24 NOTE — Progress Notes (Signed)
Negative hip x ray.

## 2021-05-24 NOTE — Progress Notes (Signed)
Urine shows bacteria and mucus in urine microscopic likely urinary tract infection.  CBC and CMP ok.  Would like you to verify no antibiotic allergies as I see only Zonegran on allergy list. I sent keflex for her to start and also will wait on urine culture and change antibiotic if needed.  Hydrate. Return or seek care if any symptoms worsening at  anytime.  Meds ordered this encounter Medications  cephALEXin (KEFLEX) 500 MG capsule   Sig: Take 1 capsule (500 mg total) by mouth 3 (three) times daily for 7 days.   Dispense:  21 capsule   Refill:  0  See hip x  ray result was negative )

## 2021-05-25 LAB — CULTURE, URINE COMPREHENSIVE
MICRO NUMBER:: 12000117
SPECIMEN QUALITY:: ADEQUATE

## 2021-05-26 ENCOUNTER — Other Ambulatory Visit: Payer: Self-pay | Admitting: Adult Health

## 2021-05-26 DIAGNOSIS — N3000 Acute cystitis without hematuria: Secondary | ICD-10-CM

## 2021-05-26 MED ORDER — CIPROFLOXACIN HCL 500 MG PO TABS: 500.0000 mg | ORAL_TABLET | Freq: Two times a day (BID) | ORAL | 0 refills | Status: AC

## 2021-05-26 NOTE — Progress Notes (Signed)
Stop keflex. Will send in bactrim as bacteria in urine is resistant to kefex.  Culture returned.

## 2021-05-26 NOTE — Progress Notes (Signed)
Meds ordered this encounter  Medications   ciprofloxacin (CIPRO) 500 MG tablet    Sig: Take 1 tablet (500 mg total) by mouth 2 (two) times daily for 7 days.    Dispense:  14 tablet    Refill:  0

## 2021-05-26 NOTE — Progress Notes (Signed)
I did not send Bactrim contraindicated with allergy to Zonegran, so stop Keflex and pick up Cipro to take as directed. For uti : Enterobacter aerogenesAbnormal  Comment: Greater than 100,000 CFU/mL of Klebsiella aerogenes (Enterobacter)

## 2021-06-06 ENCOUNTER — Ambulatory Visit: Payer: Medicare Other | Admitting: Adult Health

## 2021-06-20 DIAGNOSIS — M791 Myalgia, unspecified site: Secondary | ICD-10-CM | POA: Diagnosis not present

## 2021-06-20 DIAGNOSIS — G43719 Chronic migraine without aura, intractable, without status migrainosus: Secondary | ICD-10-CM | POA: Diagnosis not present

## 2021-06-20 DIAGNOSIS — M542 Cervicalgia: Secondary | ICD-10-CM | POA: Diagnosis not present

## 2021-06-21 ENCOUNTER — Ambulatory Visit: Payer: Medicare Other | Admitting: Adult Health

## 2021-06-22 ENCOUNTER — Ambulatory Visit: Payer: Medicare Other

## 2021-06-22 ENCOUNTER — Encounter: Payer: Self-pay | Admitting: Internal Medicine

## 2021-06-22 ENCOUNTER — Other Ambulatory Visit: Payer: Self-pay

## 2021-06-22 ENCOUNTER — Ambulatory Visit (INDEPENDENT_AMBULATORY_CARE_PROVIDER_SITE_OTHER): Payer: Medicare Other | Admitting: Internal Medicine

## 2021-06-22 ENCOUNTER — Ambulatory Visit (INDEPENDENT_AMBULATORY_CARE_PROVIDER_SITE_OTHER): Payer: Medicare Other

## 2021-06-22 VITALS — BP 132/102 | HR 93 | Temp 95.9°F | Ht 62.99 in | Wt 202.6 lb

## 2021-06-22 DIAGNOSIS — G8929 Other chronic pain: Secondary | ICD-10-CM | POA: Diagnosis not present

## 2021-06-22 DIAGNOSIS — R109 Unspecified abdominal pain: Secondary | ICD-10-CM

## 2021-06-22 DIAGNOSIS — I10 Essential (primary) hypertension: Secondary | ICD-10-CM | POA: Diagnosis not present

## 2021-06-22 DIAGNOSIS — N3000 Acute cystitis without hematuria: Secondary | ICD-10-CM

## 2021-06-22 DIAGNOSIS — K5900 Constipation, unspecified: Secondary | ICD-10-CM | POA: Diagnosis not present

## 2021-06-22 LAB — URINALYSIS, ROUTINE W REFLEX MICROSCOPIC
Bilirubin Urine: NEGATIVE
Hgb urine dipstick: NEGATIVE
Ketones, ur: NEGATIVE
Leukocytes,Ua: NEGATIVE
Nitrite: NEGATIVE
RBC / HPF: NONE SEEN (ref 0–?)
Specific Gravity, Urine: 1.015 (ref 1.000–1.030)
Total Protein, Urine: NEGATIVE
Urine Glucose: NEGATIVE
Urobilinogen, UA: 0.2 (ref 0.0–1.0)
WBC, UA: NONE SEEN (ref 0–?)
pH: 6 (ref 5.0–8.0)

## 2021-06-22 LAB — MICROALBUMIN / CREATININE URINE RATIO
Creatinine,U: 98.5 mg/dL
Microalb Creat Ratio: 0.7 mg/g (ref 0.0–30.0)
Microalb, Ur: <0.7 mg/dL (ref 0.0–1.9)

## 2021-06-22 NOTE — Patient Instructions (Addendum)
  We are rechecking your urine today for UTI  1) Your blood pressure is not at goal.  Please increase your amlodipine dose to 10 mg daily usibg the 5 mg tablets you have.  Et me know your readings after one week.  Continue 100 mg Toprol as well   2) Your flank pain may be due to your constipation:  There are 4 categories of laxatives.  They can be combined,  But some should not be used daily .  Bulk  forming laxatives   Ok to use daily  your fiber gummies,  or Citrucel, benefiber, metamucil, Fibercon, or miralax)  Stool softener (docusate ; there's only one) :  ok to use daily  Or Magnesium in a pill form 250 mg or 400 mg dailyu    Save these next ones for use if no BM in 3 days : Stimulant laxatives (Ex Lax,  Correctol,  Senna,  Dulcolax)  :  avoid on a daily basis, not more than 2/week   Cathartic laxatives ( MOM,  Mag citrate, Lactulose ) : not more than 2 /week

## 2021-06-22 NOTE — Progress Notes (Signed)
Subjective:  Patient ID: Margaret Zuniga, female    DOB: 1981/05/24  Age: 40 y.o. MRN: [id number]  CC: The primary encounter diagnosis was Essential hypertension. Diagnoses of Acute cystitis without hematuria, Chronic right flank pain, and Essential hypertension, benign were also pertinent to this visit.  HPI FRAYA UEDA presents for follow up on recent treatment for UTI   This visit occurred during the SARS-CoV-2 public health emergency.  Safety protocols were in place, including screening questions prior to the visit, additional usage of staff PPE, and extensive cleaning of exam room while observing appropriate contact time as indicated for disinfecting solutions.   Treated on June 13 for UTI;  presented with symptoms of  right sided flank pain without dysuria and frequency.  Both still  persist Culture reviewed:  enterobacter aerogenese  resistant to cephalosporins; abx therapy was changed from keflex to Cipro  for 7 days .  Has been constipated chronically and dealing with it  with daily use of BFL but stopped using it recently and became constipated again.    2) Elevated blood pressure:  does not check at home , but has access to machine.  Taking metoprolol for chronic headaches along with 5 mg amlodipine .  Takes meloxicam for chronic msk pain. Along with excedrin which contains caffeine.      Outpatient Medications Prior to Visit  Medication Sig Dispense Refill   Acetaminophen-Caffeine 500-65 MG TABS Take 1 tablet by mouth in the morning, at noon, and at bedtime.      albuterol (VENTOLIN HFA) 108 (90 Base) MCG/ACT inhaler TAKE 2 PUFFS BY MOUTH EVERY 6 HOURS AS NEEDED FOR WHEEZE OR SHORTNESS OF BREATH (Patient taking differently: Inhale 2 puffs into the lungs every 6 (six) hours as needed for wheezing or shortness of breath.) 18 g 0   amLODipine (NORVASC) 5 MG tablet TAKE 1 TABLET BY MOUTH EVERY DAY 90 tablet 0   Atogepant (QULIPTA) 60 MG TABS      baclofen (LIORESAL) 10 MG tablet Take 10 mg  by mouth 2 (two) times daily as needed.     carbamazepine (CARBATROL) 300 MG 12 hr capsule Take 300 mg by mouth in the morning, at noon, and at bedtime.   1   cetirizine (ZYRTEC) 10 MG tablet Take 10 mg by mouth daily.     cyclobenzaprine (FLEXERIL) 5 MG tablet Take 1 tablet (5 mg total) by mouth 3 (three) times daily as needed for muscle spasms. 30 tablet 1   diphenhydrAMINE HCl, Sleep, (ZZZQUIL PO) Take by mouth at bedtime.     escitalopram (LEXAPRO) 10 MG tablet TAKE 1 TABLET BY MOUTH EVERY DAY 90 tablet 0   fluticasone furoate-vilanterol (BREO ELLIPTA) 100-25 MCG/INH AEPB Inhale into the lungs.     medroxyPROGESTERone (DEPO-PROVERA) 150 MG/ML injection Inject 1 mL (150 mg total) into the muscle every 3 (three) months. 1 mL 3   meloxicam (MOBIC) 15 MG tablet TAKE 1 TABLET BY MOUTH EVERY DAY 30 tablet 5   metoprolol succinate (TOPROL-XL) 100 MG 24 hr tablet TAKE 1 TABLET BY MOUTH DAILY. TAKE WITH OR IMMEDIATELY FOLLOWING A MEAL. 90 tablet 0   Multiple Vitamins-Minerals (CENTRUM WOMEN PO) Take by mouth daily in the afternoon.     No facility-administered medications prior to visit.    Review of Systems;  Patient denies headache, fevers, malaise, unintentional weight loss, skin rash, eye pain, sinus congestion and sinus pain, sore throat, dysphagia,  hemoptysis , cough, dyspnea, wheezing, chest pain, palpitations, orthopnea,  edema, abdominal pain, nausea, melena, diarrhea, constipation, flank pain, dysuria, hematuria, urinary  Frequency, nocturia, numbness, tingling, seizures,  Focal weakness, Loss of consciousness,  Tremor, insomnia, depression, anxiety, and suicidal ideation.      Objective:  BP (!) 132/102 (BP Location: Left Arm, Patient Position: Sitting)   Pulse 93   Temp (!) 95.9 F (35.5 C)   Ht 5' 2.99" (1.6 m)   Wt 202 lb 9.6 oz (91.9 kg)   SpO2 96%   BMI 35.90 kg/m   BP Readings from Last 3 Encounters:  06/22/21 (!) 132/102  05/23/21 136/88  11/11/20 136/87    Wt  Readings from Last 3 Encounters:  06/22/21 202 lb 9.6 oz (91.9 kg)  05/23/21 205 lb 3.2 oz (93.1 kg)  11/11/20 199 lb 4.8 oz (90.4 kg)    General appearance: alert, cooperative and appears stated age Ears: normal TM's and external ear canals both ears Throat: lips, mucosa, and tongue normal; teeth and gums normal Neck: no adenopathy, no carotid bruit, supple, symmetrical, trachea midline and thyroid not enlarged, symmetric, no tenderness/mass/nodules Back: symmetric, no curvature. ROM normal. No CVA tenderness. Lungs: clear to auscultation bilaterally Heart: regular rate and rhythm, S1, S2 normal, no murmur, click, rub or gallop Abdomen: soft, non-tender; bowel sounds normal; no masses,  no organomegaly Pulses: 2+ and symmetric Skin: Skin color, texture, turgor normal. No rashes or lesions Lymph nodes: Cervical, supraclavicular, and axillary nodes normal.  Lab Results  Component Value Date   HGBA1C 5.1 11/11/2020   HGBA1C 5.3 05/15/2019   HGBA1C 5.1 10/08/2017    Lab Results  Component Value Date   CREATININE 0.78 05/23/2021   CREATININE 0.88 01/14/2020   CREATININE 0.74 05/15/2019    Lab Results  Component Value Date   WBC 6.7 05/23/2021   HGB 13.9 05/23/2021   HCT 42.1 05/23/2021   PLT 315.0 05/23/2021   GLUCOSE 82 05/23/2021   CHOL 181 05/15/2019   TRIG 102.0 05/15/2019   HDL 57.70 05/15/2019   LDLDIRECT 109.0 04/17/2016   LDLCALC 103 (H) 05/15/2019   ALT 20 05/23/2021   AST 16 05/23/2021   NA 141 05/23/2021   K 4.2 05/23/2021   CL 106 05/23/2021   CREATININE 0.78 05/23/2021   BUN 13 05/23/2021   CO2 25 05/23/2021   TSH 1.770 11/11/2020   HGBA1C 5.1 11/11/2020   MICROALBUR <0.7 06/22/2021      Assessment & Plan:   Problem List Items Addressed This Visit       Unprioritized   Chronic right flank pain    Pain did not resolve or improve with treatment of UTI, which may have been an incidental finding.  Repeat UA /culture done today were normal,  And  plain films  Of abdomen suggest moderate  constipation .  Will treat for constipation and if not resolved,  Will need CT abd        Relevant Medications   baclofen (LIORESAL) 10 MG tablet   Other Relevant Orders   DG Abd 1 View   Essential hypertension, benign    she reports compliance with medication regimen  but has an elevated reading today in office.  She is not using NSAIDs daily.  Discussed goal of 120/70  (130/80 for patients over 70)  to preserve renal function.  She has been asked to check her  BP  at home and  submit readings for evaluation. Renal function, electrolytes and screen for proteinuria are all normal .  Lab Results  Component Value  Date   CREATININE 0.78 05/23/2021   Lab Results  Component Value Date   NA 141 05/23/2021   K 4.2 05/23/2021   CL 106 05/23/2021   CO2 25 05/23/2021         Other Visit Diagnoses     Essential hypertension    -  Primary   Relevant Orders   Microalbumin / creatinine urine ratio (Completed)   Acute cystitis without hematuria       Relevant Orders   Urinalysis, Routine w reflex microscopic (Completed)   Urine Culture (Completed)       I spent 30 minutes dedicated to the care of this patient on the date of this encounter to include pre-visit review of his medical history,  Face-to-face time with the patient , and post visit ordering of testing and therapeutics.  I am having Vista Lawman. Pastorino start on Lactulose. I am also having her maintain her cetirizine, Acetaminophen-Caffeine, carbamazepine, albuterol, Breo Ellipta, (diphenhydrAMINE HCl, Sleep, (ZZZQUIL PO)), Multiple Vitamins-Minerals (CENTRUM WOMEN PO), medroxyPROGESTERone, meloxicam, escitalopram, amLODipine, metoprolol succinate, cyclobenzaprine, Qulipta, and baclofen.  Meds ordered this encounter  Medications   Lactulose 20 GM/30ML SOLN    Sig: 30 ml every 4 hours until constipation is relieved    Dispense:  236 mL    Refill:  3     There are no discontinued  medications.  Follow-up: No follow-ups on file.   Crecencio Mc, MD

## 2021-06-23 LAB — URINE CULTURE
MICRO NUMBER:: 12114421
SPECIMEN QUALITY:: ADEQUATE

## 2021-06-24 ENCOUNTER — Telehealth: Payer: Self-pay

## 2021-06-24 ENCOUNTER — Encounter: Payer: Self-pay | Admitting: Internal Medicine

## 2021-06-24 DIAGNOSIS — G8929 Other chronic pain: Secondary | ICD-10-CM | POA: Insufficient documentation

## 2021-06-24 DIAGNOSIS — R109 Unspecified abdominal pain: Secondary | ICD-10-CM | POA: Insufficient documentation

## 2021-06-24 MED ORDER — LACTULOSE 20 GM/30ML PO SOLN: ORAL | 3 refills | Status: DC

## 2021-06-24 NOTE — Assessment & Plan Note (Addendum)
Pain did not resolve or improve with treatment of UTI, which may have been an incidental finding.  Repeat UA /culture done today were normal,  And plain films  Of abdomen suggest moderate  constipation .  Will treat for constipation and if not resolved,  Will need CT abd

## 2021-06-24 NOTE — Telephone Encounter (Signed)
LMTCB in regards to lab results.  

## 2021-06-24 NOTE — Assessment & Plan Note (Signed)
she reports compliance with medication regimen  but has an elevated reading today in office.  She is not using NSAIDs daily.  Discussed goal of 120/70  (130/80 for patients over 70)  to preserve renal function.  She has been asked to check her  BP  at home and  submit readings for evaluation. Renal function, electrolytes and screen for proteinuria are all normal .  Lab Results  Component Value Date   CREATININE 0.78 05/23/2021   Lab Results  Component Value Date   NA 141 05/23/2021   K 4.2 05/23/2021   CL 106 05/23/2021   CO2 25 05/23/2021

## 2021-06-25 ENCOUNTER — Other Ambulatory Visit: Payer: Self-pay | Admitting: Internal Medicine

## 2021-06-27 ENCOUNTER — Other Ambulatory Visit: Payer: Self-pay

## 2021-06-27 ENCOUNTER — Ambulatory Visit (INDEPENDENT_AMBULATORY_CARE_PROVIDER_SITE_OTHER): Payer: Medicare Other | Admitting: Obstetrics and Gynecology

## 2021-06-27 DIAGNOSIS — Z3042 Encounter for surveillance of injectable contraceptive: Secondary | ICD-10-CM | POA: Diagnosis not present

## 2021-06-27 MED ORDER — MEDROXYPROGESTERONE ACETATE 150 MG/ML IM SUSP
150.0000 mg | Freq: Once | INTRAMUSCULAR | Status: AC
  Administered 2021-06-27: 150 mg via INTRAMUSCULAR

## 2021-06-27 NOTE — Progress Notes (Signed)
Date last pap: 11/05/2018. Last Depo-Provera: 04/01/2021. Side Effects if any: None. Serum HCG indicated? N/A. Depo-Provera 150 mg IM given by: Cristy Folks, CMA. Next appointment due 09/12/21-09/26/21.

## 2021-06-29 DIAGNOSIS — R0609 Other forms of dyspnea: Secondary | ICD-10-CM | POA: Diagnosis not present

## 2021-06-29 DIAGNOSIS — J45909 Unspecified asthma, uncomplicated: Secondary | ICD-10-CM | POA: Diagnosis not present

## 2021-08-02 DIAGNOSIS — M791 Myalgia, unspecified site: Secondary | ICD-10-CM | POA: Diagnosis not present

## 2021-08-02 DIAGNOSIS — G43719 Chronic migraine without aura, intractable, without status migrainosus: Secondary | ICD-10-CM | POA: Diagnosis not present

## 2021-08-02 DIAGNOSIS — M542 Cervicalgia: Secondary | ICD-10-CM | POA: Diagnosis not present

## 2021-08-08 ENCOUNTER — Other Ambulatory Visit: Payer: Self-pay | Admitting: *Deleted

## 2021-08-08 ENCOUNTER — Encounter: Payer: Self-pay | Admitting: Obstetrics and Gynecology

## 2021-08-08 ENCOUNTER — Ambulatory Visit: Payer: Self-pay | Admitting: Urology

## 2021-08-08 DIAGNOSIS — N200 Calculus of kidney: Secondary | ICD-10-CM

## 2021-08-10 ENCOUNTER — Ambulatory Visit
Admission: RE | Admit: 2021-08-10 | Discharge: 2021-08-10 | Disposition: A | Discharge status: Home / Self Care | Payer: Medicare Other | Attending: Urology | Admitting: Urology

## 2021-08-10 ENCOUNTER — Other Ambulatory Visit: Payer: Self-pay

## 2021-08-10 ENCOUNTER — Encounter: Payer: Self-pay | Admitting: Urology

## 2021-08-10 ENCOUNTER — Ambulatory Visit (INDEPENDENT_AMBULATORY_CARE_PROVIDER_SITE_OTHER): Payer: Medicare Other | Admitting: Urology

## 2021-08-10 ENCOUNTER — Ambulatory Visit
Admission: RE | Admit: 2021-08-10 | Discharge: 2021-08-10 | Disposition: A | Discharge status: Home / Self Care | Payer: Medicare Other | Source: Ambulatory Visit | Attending: Urology | Admitting: Urology

## 2021-08-10 VITALS — BP 126/85 | HR 108 | Ht 63.0 in | Wt 200.0 lb

## 2021-08-10 DIAGNOSIS — N200 Calculus of kidney: Secondary | ICD-10-CM

## 2021-08-10 DIAGNOSIS — Z87442 Personal history of urinary calculi: Secondary | ICD-10-CM | POA: Diagnosis not present

## 2021-08-10 NOTE — Progress Notes (Signed)
08/10/2021 2:02 PM   Margaret Zuniga 07-12-1981 [id number]  Referring provider: Crecencio Mc, MD [address] [place],  [place] [address]  Chief Complaint  Patient presents with   Nephrolithiasis    Urologic history: 1.  Nephrolithiasis -Left distal ureteral calculi 01/2020, declined ureteroscopy and opted for ESWL -Punctate left renal calculus on CT -Stone analysis mixed calcium oxalate/calcium phosphate (45/35/20)   HPI: 40 y.o. 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 Marry Guan    Surgical History: Past Surgical History:  Procedure Laterality Date   ELBOW SURGERY Right [place] LITHOTRIPSY Left 01/22/2020   Procedure: EXTRACORPOREAL SHOCK WAVE LITHOTRIPSY (ESWL);  Surgeon: Abbie Sons, MD;  Location: [facility] ORS;  Service: Urology;  Laterality: Left;   EYE SURGERY  1995   KNEE ARTHROSCOPY WITH LATERAL MENISECTOMY Right 03/04/2020   Procedure: KNEE ARTHROSCOPY WITH PARTIAL LATERAL MENISECTOMY;  Surgeon: Hessie Knows, MD;  Location: [facility] ORS;  Service:  Orthopedics;  Laterality: Right;   LEG SURGERY  1985   SHOULDER SURGERY     SUBACROMIAL DECOMPRESSION  2000   Right shoulder, [place]   TONSILLECTOMY  2001    Home Medications:  Allergies as of 08/10/2021       Reactions   Zonegran [zonisamide] Rash        Medication List        Accurate as of August 10, 2021  2:02 PM. If you have any questions, ask your nurse or doctor.          Acetaminophen-Caffeine 500-65 MG Tabs Take 1 tablet by mouth in the morning, at noon, and at bedtime.   albuterol 108 (90 Base) MCG/ACT inhaler Commonly known as: VENTOLIN HFA TAKE 2 PUFFS BY MOUTH EVERY 6 HOURS AS NEEDED FOR WHEEZE OR SHORTNESS OF BREATH What changed: See the new instructions.   amLODipine 5 MG tablet Commonly known as: NORVASC TAKE 1 TABLET BY MOUTH EVERY DAY   baclofen 10 MG tablet Commonly known as: LIORESAL Take 10 mg by mouth 2 (two) times daily as needed.   Breo Ellipta 100-25 MCG/INH Aepb Generic drug: fluticasone furoate-vilanterol Inhale into the lungs.   carbamazepine 300 MG 12 hr capsule Commonly known as: CARBATROL Take 300 mg by mouth in the morning, at noon, and at bedtime.   CENTRUM WOMEN PO Take by mouth daily in the afternoon.   cetirizine 10 MG tablet Commonly known as: ZYRTEC Take 10 mg by mouth daily.   cyclobenzaprine 5 MG tablet Commonly known as: FLEXERIL Take 1 tablet (5 mg total) by mouth 3 (three) times daily as needed for muscle spasms.  escitalopram 10 MG tablet Commonly known as: LEXAPRO TAKE 1 TABLET BY MOUTH EVERY DAY   Lactulose 20 GM/30ML Soln 30 ml every 4 hours until constipation is relieved   medroxyPROGESTERone 150 MG/ML injection Commonly known as: DEPO-PROVERA Inject 1 mL (150 mg total) into the muscle every 3 (three) months.   meloxicam 15 MG tablet Commonly known as: MOBIC TAKE 1 TABLET BY MOUTH EVERY DAY   metoprolol succinate 100 MG 24 hr tablet Commonly known as: TOPROL-XL TAKE 1 TABLET BY MOUTH DAILY.  TAKE WITH OR IMMEDIATELY FOLLOWING A MEAL.   Qulipta 60 MG Tabs Generic drug: Atogepant   ZZZQUIL PO Take by mouth at bedtime.        Allergies:  Allergies  Allergen Reactions   Zonegran [Zonisamide] Rash    Family History: Family History  Problem Relation Age of Onset   Diabetes Mother    Coronary artery disease Mother    Hyperlipidemia Mother    Hypertension Mother    Parkinson's disease Mother    Heart disease Maternal Grandfather    Stroke Father     Social History:  reports that she has never smoked. She has never used smokeless tobacco. She reports that she does not drink alcohol and does not use drugs.   Physical Exam: BP 126/85   Pulse (!) 108   Ht '5\' 3"'$  (1.6 m)   Wt 200 lb (90.7 kg)   BMI 35.43 kg/m   Constitutional:  Alert and oriented, No acute distress. HEENT: [place] AT, moist mucus membranes.  Trachea midline, no masses. Cardiovascular: No clubbing, cyanosis, or edema. Respiratory: Normal respiratory effort, no increased work of breathing.  Pertinent Imaging: KUB images were personally reviewed and interpreted   Assessment & Plan:    1.  Nephrolithiasis Asymptomatic, punctate calculus on CT No calculi visualized on KUB today Continue annual follow-up with KUB Call earlier for flank/pelvic/abdominal pain   Abbie Sons, MD  [facility] [address] [place], [place] [address] [phone]

## 2021-08-11 ENCOUNTER — Encounter: Payer: Self-pay | Admitting: Urology

## 2021-08-11 ENCOUNTER — Other Ambulatory Visit: Payer: Self-pay

## 2021-08-11 MED ORDER — AMLODIPINE BESYLATE 10 MG PO TABS: 10.0000 mg | ORAL_TABLET | Freq: Every day | ORAL | 1 refills | Status: DC

## 2021-08-17 ENCOUNTER — Other Ambulatory Visit: Payer: Self-pay | Admitting: Internal Medicine

## 2021-09-07 ENCOUNTER — Encounter: Payer: Self-pay | Admitting: Internal Medicine

## 2021-09-07 ENCOUNTER — Other Ambulatory Visit: Payer: Self-pay

## 2021-09-07 ENCOUNTER — Ambulatory Visit (INDEPENDENT_AMBULATORY_CARE_PROVIDER_SITE_OTHER): Payer: Medicare Other | Admitting: Internal Medicine

## 2021-09-07 VITALS — BP 150/82 | HR 90 | Temp 96.4°F | Ht 63.0 in | Wt 203.4 lb

## 2021-09-07 DIAGNOSIS — M7752 Other enthesopathy of left foot: Secondary | ICD-10-CM | POA: Diagnosis not present

## 2021-09-07 DIAGNOSIS — I10 Essential (primary) hypertension: Secondary | ICD-10-CM | POA: Diagnosis not present

## 2021-09-07 HISTORY — DX: Other enthesopathy of left foot and ankle: M77.52

## 2021-09-07 MED ORDER — PREDNISONE 10 MG PO TABS: ORAL_TABLET | ORAL | 0 refills | Status: DC

## 2021-09-07 NOTE — Assessment & Plan Note (Signed)
she reports compliance with medication regimen  But continues to have elevated readins  in office.  She is in pain and using NSAIDs currently .  Discussed goal of 120/70  (130/80 for patients over 70)  to preserve renal function.  She has been asked to check her  BP  at home and  submit readings for evaluation. Renal function, electrolytes and screen for proteinuria have been  normal .  Lab Results  Component Value Date   CREATININE 0.78 05/23/2021   Lab Results  Component Value Date   NA 141 05/23/2021   K 4.2 05/23/2021   CL 106 05/23/2021   CO2 25 05/23/2021   Lab Results  Component Value Date   MICROALBUR <0.7 06/22/2021   MICROALBUR 0.9 10/18/2018

## 2021-09-07 NOTE — Progress Notes (Signed)
Subjective:  Patient ID: Margaret Zuniga, female    DOB: 1981/03/21  Age: 40 y.o. MRN: [id number]  CC: The primary encounter diagnosis was Sub-Achilles bursitis, left. A diagnosis of Essential hypertension, benign was also pertinent to this visit.  HPI Margaret Zuniga presents for evaluation of left heel pain  Chief Complaint  Patient presents with   Acute Visit    Heel pain x 3 weeks. Pt stated that the pain in constant and occurs with any movement weight bearing or no weight bearing.    40 yr old female with chronic pain secondary to migraines  thoracic spine pain, shoulder pain , hip pain  , paralytic syndrome  presents with 3 week history of moderate to severe   heel pain that is present both with weight bearing and non weight bearing pain is in the base of heel and wraps around the back o the heel  to the level of ankle bone .  It has not spread or become ore severe..  taking meloxicam 15 mg daily and Excedrin migraine 2 tablets daily in the morning.  Tried changing shoes,  no improvement  pain is aggravated by dorsiflexion and any other activity the stretches the achilles tendon .  Has not tried heat or ice.  Has not noticed any bruising or swelling.    Outpatient Medications Prior to Visit  Medication Sig Dispense Refill   Acetaminophen-Caffeine 500-65 MG TABS Take 1 tablet by mouth in the morning, at noon, and at bedtime.      albuterol (VENTOLIN HFA) 108 (90 Base) MCG/ACT inhaler TAKE 2 PUFFS BY MOUTH EVERY 6 HOURS AS NEEDED FOR WHEEZE OR SHORTNESS OF BREATH (Patient taking differently: Inhale 2 puffs into the lungs every 6 (six) hours as needed for wheezing or shortness of breath.) 18 g 0   amLODipine (NORVASC) 10 MG tablet Take 1 tablet (10 mg total) by mouth daily. 90 tablet 1   Atogepant (QULIPTA) 60 MG TABS      baclofen (LIORESAL) 10 MG tablet Take 10 mg by mouth 2 (two) times daily as needed.     carbamazepine (CARBATROL) 300 MG 12 hr capsule Take 300 mg by mouth in the morning, at noon,  and at bedtime.   1   cetirizine (ZYRTEC) 10 MG tablet Take 10 mg by mouth daily.     cyclobenzaprine (FLEXERIL) 5 MG tablet Take 1 tablet (5 mg total) by mouth 3 (three) times daily as needed for muscle spasms. 30 tablet 1   diphenhydrAMINE HCl, Sleep, (ZZZQUIL PO) Take by mouth at bedtime.     escitalopram (LEXAPRO) 10 MG tablet TAKE 1 TABLET BY MOUTH EVERY DAY 90 tablet 0   fluticasone furoate-vilanterol (BREO ELLIPTA) 100-25 MCG/INH AEPB Inhale into the lungs.     Lactulose 20 GM/30ML SOLN 30 ml every 4 hours until constipation is relieved 236 mL 3   medroxyPROGESTERone (DEPO-PROVERA) 150 MG/ML injection Inject 1 mL (150 mg total) into the muscle every 3 (three) months. 1 mL 3   metoprolol succinate (TOPROL-XL) 100 MG 24 hr tablet TAKE 1 TABLET BY MOUTH DAILY. TAKE WITH OR IMMEDIATELY FOLLOWING A MEAL. 90 tablet 0   Multiple Vitamins-Minerals (CENTRUM WOMEN PO) Take by mouth daily in the afternoon.     amLODipine (NORVASC) 5 MG tablet TAKE 1 TABLET BY MOUTH EVERY DAY 90 tablet 0   No facility-administered medications prior to visit.    Review of Systems;  Patient denies headache, fevers, malaise, unintentional weight loss, skin rash,  eye pain, sinus congestion and sinus pain, sore throat, dysphagia,  hemoptysis , cough, dyspnea, wheezing, chest pain, palpitations, orthopnea, edema, abdominal pain, nausea, melena, diarrhea, constipation, flank pain, dysuria, hematuria, urinary  Frequency, nocturia, numbness, tingling, seizures,  Focal weakness, Loss of consciousness,  Tremor, insomnia, depression, anxiety, and suicidal ideation.      Objective:  BP (!) 150/82 (BP Location: Left Arm, Patient Position: Sitting, Cuff Size: Normal)   Pulse 90   Temp (!) 96.4 F (35.8 C) (Temporal)   Ht 5\' 3"  (1.6 m)   Wt 203 lb 6.4 oz (92.3 kg)   SpO2 98%   BMI 36.03 kg/m   BP Readings from Last 3 Encounters:  09/07/21 (!) 150/82  08/10/21 126/85  06/22/21 (!) 132/102    Wt Readings from Last 3  Encounters:  09/07/21 203 lb 6.4 oz (92.3 kg)  08/10/21 200 lb (90.7 kg)  06/22/21 202 lb 9.6 oz (91.9 kg)    General appearance: alert, obese , cooperative and appears stated age Ears: normal TM's and external ear canals both ears Throat: lips, mucosa, and tongue normal; teeth and gums normal Neck: no adenopathy, no carotid bruit, supple, symmetrical, trachea midline and thyroid not enlarged, symmetric, no tenderness/mass/nodules Back: symmetric, no curvature. ROM normal. No CVA tenderness. Lungs: clear to auscultation bilaterally Heart: regular rate and rhythm, S1, S2 normal, no murmur, click, rub or gallop Abdomen: soft, non-tender; bowel sounds normal; no masses,  no organomegaly Pulses: 2+ and symmetric Ext:  no warmth or erythema to heel  no bruising , no masses  Skin: Skin color, texture, turgor normal. No rashes or lesions Lymph nodes: Cervical, supraclavicular, and axillary nodes normal.  Lab Results  Component Value Date   HGBA1C 5.1 11/11/2020   HGBA1C 5.3 05/15/2019   HGBA1C 5.1 10/08/2017    Lab Results  Component Value Date   CREATININE 0.78 05/23/2021   CREATININE 0.88 01/14/2020   CREATININE 0.74 05/15/2019    Lab Results  Component Value Date   WBC 6.7 05/23/2021   HGB 13.9 05/23/2021   HCT 42.1 05/23/2021   PLT 315.0 05/23/2021   GLUCOSE 82 05/23/2021   CHOL 181 05/15/2019   TRIG 102.0 05/15/2019   HDL 57.70 05/15/2019   LDLDIRECT 109.0 04/17/2016   LDLCALC 103 (H) 05/15/2019   ALT 20 05/23/2021   AST 16 05/23/2021   NA 141 05/23/2021   K 4.2 05/23/2021   CL 106 05/23/2021   CREATININE 0.78 05/23/2021   BUN 13 05/23/2021   CO2 25 05/23/2021   TSH 1.770 11/11/2020   HGBA1C 5.1 11/11/2020   MICROALBUR <0.7 06/22/2021    Abdomen 1 view (KUB)  Result Date: 08/13/2021 CLINICAL DATA:  History of kidney stone EXAM: ABDOMEN - 1 VIEW COMPARISON:  06/22/2020, CT 01/13/2019 FINDINGS: The bowel gas pattern is normal. No radio-opaque calculi or other  significant radiographic abnormality are seen. IMPRESSION: Negative. Electronically Signed   By: Donavan Foil M.D.   On: 08/13/2021 18:59    Assessment & Plan:   Problem List Items Addressed This Visit       Unprioritized   Essential hypertension, benign    she reports compliance with medication regimen  But continues to have elevated readins  in office.  She is in pain and using NSAIDs currently .  Discussed goal of 120/70  (130/80 for patients over 70)  to preserve renal function.  She has been asked to check her  BP  at home and  submit readings for evaluation. Renal  function, electrolytes and screen for proteinuria have been  normal .  Lab Results  Component Value Date   CREATININE 0.78 05/23/2021   Lab Results  Component Value Date   NA 141 05/23/2021   K 4.2 05/23/2021   CL 106 05/23/2021   CO2 25 05/23/2021   Lab Results  Component Value Date   MICROALBUR <0.7 06/22/2021   MICROALBUR 0.9 10/18/2018           Sub-Achilles bursitis, left - Primary    Trial of prednisone in a tapering dose,  increase tylenol to 2000 mg daily,  Heat/ice and podiatry referral       Relevant Orders   Ambulatory referral to [facility] ordered this encounter  Medications   predniSONE (DELTASONE) 10 MG tablet    Sig: 6 tablets on Day 1 , then reduce by 1 tablet daily until gone    Dispense:  21 tablet    Refill:  0    Medications Discontinued During This Encounter  Medication Reason   amLODipine (NORVASC) 5 MG tablet     Follow-up: No follow-ups on file.   Crecencio Mc, MD

## 2021-09-07 NOTE — Patient Instructions (Addendum)
Your pain appears to be due either to plantar fasciitis or pre achilles  tendonitis   please  increase tylenol to 2000 mg of acetominophen (tylenol) daily  In divided doses (500 mg every 6 hours  Or 1000 mg every 12 hours.) Excedrin has 250 mg tylenol in it.   I am prescribing a Prednisone taper , TAKE NO MELOXICAM WHILE YOU ARE TAKING  prednisone   Heat alternating  with ice  for 15 minutes each   Referral to Daylene Katayama  in process

## 2021-09-07 NOTE — Assessment & Plan Note (Signed)
Trial of prednisone in a tapering dose,  increase tylenol to 2000 mg daily,  Heat/ice and podiatry referral

## 2021-09-13 DIAGNOSIS — G43719 Chronic migraine without aura, intractable, without status migrainosus: Secondary | ICD-10-CM | POA: Diagnosis not present

## 2021-09-13 DIAGNOSIS — M791 Myalgia, unspecified site: Secondary | ICD-10-CM | POA: Diagnosis not present

## 2021-09-13 DIAGNOSIS — M542 Cervicalgia: Secondary | ICD-10-CM | POA: Diagnosis not present

## 2021-09-21 ENCOUNTER — Ambulatory Visit (INDEPENDENT_AMBULATORY_CARE_PROVIDER_SITE_OTHER): Payer: Medicare Other

## 2021-09-21 ENCOUNTER — Ambulatory Visit (INDEPENDENT_AMBULATORY_CARE_PROVIDER_SITE_OTHER): Payer: Medicare Other | Admitting: Podiatry

## 2021-09-21 ENCOUNTER — Other Ambulatory Visit: Payer: Self-pay

## 2021-09-21 DIAGNOSIS — M722 Plantar fascial fibromatosis: Secondary | ICD-10-CM | POA: Diagnosis not present

## 2021-09-21 DIAGNOSIS — M7662 Achilles tendinitis, left leg: Secondary | ICD-10-CM | POA: Diagnosis not present

## 2021-09-21 NOTE — Patient Instructions (Signed)

## 2021-09-22 ENCOUNTER — Ambulatory Visit (INDEPENDENT_AMBULATORY_CARE_PROVIDER_SITE_OTHER): Payer: Medicare Other

## 2021-09-22 DIAGNOSIS — Z3042 Encounter for surveillance of injectable contraceptive: Secondary | ICD-10-CM

## 2021-09-22 MED ORDER — MEDROXYPROGESTERONE ACETATE 150 MG/ML IM SUSP
150.0000 mg | Freq: Once | INTRAMUSCULAR | Status: AC
  Administered 2021-09-22: 150 mg via INTRAMUSCULAR

## 2021-09-22 NOTE — Progress Notes (Signed)
Date last pap: 11/05/2018. Last Depo-Provera: 06/27/2021. Side Effects if any: none. Serum HCG indicated? N/A. Depo-Provera 150 mg IM given by: Lovell Sheehan, LPN. Next appointment due Dec 09, 2021-Dec 23, 2021.  Pt supplied depo medication.

## 2021-09-22 NOTE — Progress Notes (Signed)
  Subjective:  Patient ID: Margaret Zuniga, female    DOB: [birth year],  MRN: [id number]  Chief Complaint  Patient presents with   Tendonitis    (np) Sub-Achilles bursitis, left    40 y.o. female presents with the above complaint. History confirmed with patient.  She wears an [place] brace on this side.  This began to worsen over the last few weeks.  Most of it is on the back of the heel and on the bottom of the heel  Objective:  Physical Exam: warm, good capillary refill, no trophic changes or ulcerative lesions, normal DP and PT pulses, and normal sensory exam. Left Foot: point tenderness of the mid plantar fascia and tenderness at Achilles tendon insertion   Radiographs: Multiple views x-ray of the left foot: Cavus foot type mild  Cannula and posterior calcaneal spur Assessment:   1. Tendonitis, Achilles, left   2. Plantar fasciitis, left      Plan:  Patient was evaluated and treated and all questions answered.  Discussed the etiology and treatment options for Achilles tendinitis and plantar fasciitis including stretching, formal physical therapy with an eccentric exercises therapy plan, supportive shoegears such as a running shoe or sneaker, heel lifts, topical and oral medications.  We also discussed that I do not routinely perform injections in the area of the Achilles because of the risk of an increased damage or rupture of the tendon.  We also discussed the role of surgical treatment of this for patients who do not improve after exhausting non-surgical treatment options.  -XR reviewed with patient -Educated on stretching and icing of the affected limb. -Referral placed to physical therapy. -Injection delivered to the plantar fascia  After sterile prep with povidone-iodine solution and alcohol, the left heel was injected with 0.5cc 2% xylocaine plain, 0.5cc 0.5% marcaine plain, 10mg  triamcinolone acetonide, and 4mg  dexamethasone was injected along the medial plantar fascia at the  insertion on the plantar calcaneus. The patient tolerated the procedure well without complication.    Return in about 6 weeks (around 11/02/2021) for re-check Achilles tendon, recheck plantar fasciitis.

## 2021-09-28 ENCOUNTER — Ambulatory Visit: Payer: Medicare Other

## 2021-10-05 ENCOUNTER — Encounter: Payer: Self-pay | Admitting: Physical Therapy

## 2021-10-05 ENCOUNTER — Ambulatory Visit: Payer: Medicare Other | Attending: Podiatry | Admitting: Physical Therapy

## 2021-10-05 ENCOUNTER — Other Ambulatory Visit: Payer: Self-pay

## 2021-10-05 VITALS — BP 132/90 | HR 89

## 2021-10-05 DIAGNOSIS — R262 Difficulty in walking, not elsewhere classified: Secondary | ICD-10-CM | POA: Insufficient documentation

## 2021-10-05 DIAGNOSIS — M722 Plantar fascial fibromatosis: Secondary | ICD-10-CM | POA: Diagnosis not present

## 2021-10-05 DIAGNOSIS — M79671 Pain in right foot: Secondary | ICD-10-CM | POA: Insufficient documentation

## 2021-10-05 DIAGNOSIS — M7662 Achilles tendinitis, left leg: Secondary | ICD-10-CM | POA: Diagnosis not present

## 2021-10-05 NOTE — Therapy (Signed)
[place] [facility] [address], [place], [address] Phone: [phone]   Fax:  [phone]  Physical Therapy Evaluation  Patient Details  Name: Margaret Zuniga MRN: [id number] Date of Birth: [birth year] Referring Provider (PT): Dr. Sherryle Lis   Encounter Date: 10/05/2021   PT End of Session - 10/05/21 1551     Visit Number 1    Number of Visits 16    Date for PT Re-Evaluation 11/30/21    PT Start Time 1500    PT Stop Time 1545    PT Time Calculation (min) 45 min    Activity Tolerance Patient tolerated treatment well    Behavior During Therapy [facility] for tasks assessed/performed             Past Medical History:  Diagnosis Date   Bilateral nephrolithiasis 03/24/2018   Essential hypertension, benign 04/04/2016   Extrinsic asthma 08/15/2016   Headache due to trauma    chronic, takes, NSAIDs , imipramine, muscle relaxers (failed [facility])   History of kidney stones    Hypertension    Major depressive disorder, recurrent episode, moderate ([place]) 05/21/2013   Obesity (BMI 30.0-34.9) 04/11/2017   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 Marry Guan    Past Surgical History:  Procedure Laterality Date   ELBOW SURGERY Right [place] LITHOTRIPSY Left 01/22/2020   Procedure: EXTRACORPOREAL SHOCK WAVE LITHOTRIPSY (ESWL);  Surgeon: Abbie Sons, MD;  Location: [facility] ORS;  Service: Urology;  Laterality: Left;   EYE SURGERY  1995   KNEE ARTHROSCOPY WITH LATERAL MENISECTOMY Right 03/04/2020   Procedure: KNEE ARTHROSCOPY WITH PARTIAL LATERAL MENISECTOMY;  Surgeon: Hessie Knows, MD;  Location: [facility] ORS;  Service: Orthopedics;  Laterality: Right;   LEG SURGERY  1985   SHOULDER SURGERY     SUBACROMIAL DECOMPRESSION  2000   Right shoulder, [place]    TONSILLECTOMY  2001    Vitals:   10/05/21 1512  BP: 132/90  Pulse: 89  SpO2: 98%      Subjective Assessment - 10/05/21 1512     Subjective Patient reports that she is not wearing [place] brace during visit. She describes that every movement she makes with her left foot hurts. This started hapenning in mid late to late August. She thought it was her shoes so she changed them, but this did not help. She experiences most of her pain in the left heel. The injections she received helped for some time, but now the pain has come back.    Patient is accompained by: Family member    Pertinent History 40 y.o. female presents with the above complaint. History confirmed with patient.  She wears an [place] brace on this side.  This began to worsen over the last few weeks.  Most of it is on the back of the heel and on the bottom of the heel    Limitations Lifting;Walking;House hold activities;Other (comment)    How long can you sit comfortably? unlimited    How long can you stand comfortably? unlimited    How long can you walk comfortably? A couple of minutes    Diagnostic tests --    Patient Stated Goals Decrease pain; be able walk 40min; stair ambulation without pain    Currently in Pain? Yes  Pain Score 2     Pain Location Foot    Pain Orientation Left    Pain Descriptors / Indicators Dull    Pain Type Chronic pain    Pain Onset More than a month ago    Pain Frequency Constant    Aggravating Factors  Moving foot and weight bearing    Pain Relieving Factors Nothing    Effect of Pain on Daily Activities Does not effect, but it make weight bearing activities uncomfortable                [facility] PT Assessment - 10/05/21 0001       Assessment   Medical Diagnosis left foot pain    Referring Provider (PT) Dr. Sherryle Lis    Onset Date/Surgical Date 08/02/21    Hand Dominance Left    Next MD Visit 11/02/2021    Prior Therapy Yes thoracic spine and for paralysis of R side       Precautions   Precautions None      Restrictions   Weight Bearing Restrictions No      Balance Screen   Has the patient fallen in the past 6 months Yes    How many times? 2    Has the patient had a decrease in activity level because of a fear of falling?  No    Is the patient reluctant to leave their home because of a fear of falling?  No      Home [employer] residence    [place] to enter    Entrance Stairs-Number of Steps 4 steps 1 rail      Prior Function   Level of Independence Independent with basic ADLs    Vocation On disability    Leisure Read and go for walks      Cognition   Overall Cognitive Status History of cognitive impairments - at baseline    Attention Focused    Memory Appears intact            ANKLE EVALUATION    Gait  Increased stance time on LLE resulting in decreased step length on RLE and circumduction RLE. RLE foot is uncontrolled initial contact with decreased rocker time.    Palpation Right heel TTP    Strength:  NT R/L 5/5 Hip flexion 5/5 Hip external rotation 5/5 Hip internal rotation 5/5 Hip extension  5/5 Hip abduction 5/5 Hip adduction 5/5 Knee extension 5/5 Knee flexion 5/5 Ankle Plantarflexion 5/5 Ankle Dorsiflexion 5/5 Ankle Inversion 5/5 Ankle Eversion  AROM R/L 10/50 Ankle Plantarflexion -10/20 Ankle Dorsiflexion 35/35* Ankle Inversion 10/25 Ankle Eversion *Indicates Pain  PROM R/L 15/50 Ankle Plantarflexion 0/20 Ankle Dorsiflexion 25/35* Ankle Inversion 15/15 Ankle Eversion *Indicates Pain  Passive Accessory Motion Superior Tibiofibular Joint: NT  Inferior Tibiofibular Joint: NT  Talocrural Joint Distraction: Hypomobile bilateral R >L  Talocrural Joint AP: Hypomobile R>L  Talocrural Joint PA: Hypomobile R> L      SPECIAL TESTS  Windlass Mechanism Test: Positive in non-weight bearing position and negative in weight bearing  position.   THEREX:   Tennis ball for soft tissue massage on right plantar fasciitis  Right Great Toe Stretch 2 x 30 sec              PT Education - 10/05/21 1550     Education provided Yes    Education Details form and technique for appropriate exercise. Explanation of underlying mechanism for plantar  fasciitis    Person(s) Educated Patient    Methods Explanation;Demonstration;Verbal cues;Handout    Comprehension Verbalized understanding;Returned demonstration;Verbal cues required              PT Short Term Goals - 10/05/21 1609       PT SHORT TERM GOAL #1   Title Patient will demonstrate understanding of home exercise plan.    Baseline 10/05/21: NT    Time 2    Period Weeks    Status New    Target Date 10/19/21               PT Long Term Goals - 10/05/21 1610       PT LONG TERM GOAL #1   Title Patient will have improved function and activity level as evidenced by an increase in FOTO score by 10 points or more.    Baseline 10/05/21: 49/67    Time 8    Period Weeks    Status New    Target Date 11/30/21      PT LONG TERM GOAL #2   Title Patient will demonstrate improved ankle control and gait mechanics by maintaining a neutral right ankle during initial contact to avoid increased heel contact resulting in increased heel pain.    Baseline 10/05/21: NT    Time 8    Period Weeks    Status New    Target Date 11/30/21                    Plan - 10/05/21 1553     Clinical Impression Statement Pt  is a 40 yo white female that presents for initial eval for plantar fasciitis on left heel that is likely result of abnormal gait pattern from deficits stemming from TBI. She demonstrates decreased ankle ROM in bilateral ankles and abnormal gait pattern resulting increased heel strike from poor motor controlled of right ankle. PT will treat symptoms of plantar fasciitis, but gait mechanics are underlying driver of plantar fasciits, which will be far  more challenging to modify given prolonged nature of gait pattern. She will benefit from skilled PT to decrease left heel pain with ambulating and to improve right ankle control for more controlled initial contact to better tolerate weight bearing tasks.    Personal Factors and Comorbidities Comorbidity 1;Time since onset of injury/illness/exacerbation;Past/Current Experience    Comorbidities TBI    Examination-Activity Limitations Locomotion Level    Examination-Participation Restrictions Other   All weight bearing tasks are uncomfortable   Stability/Clinical Decision Making Evolving/Moderate complexity    Clinical Decision Making Moderate    Rehab Potential Fair    Clinical Impairments Affecting Rehab Potential (-)TBI with resulting prolonged nature of abnormal gait, (-) right ankle contracture , (+) Motivation, strong family support    PT Frequency 2x / week    PT Duration 8 weeks    PT Treatment/Interventions Joint Manipulations;Taping;Splinting;Manual techniques;Orthotic Fit/Training;Neuromuscular re-education;Therapeutic activities;Therapeutic exercise;Balance training;Gait training;Moist Heat;Traction;Aquatic Therapy;Cryotherapy;Dry needling;Passive range of motion    PT Next Visit Plan Assess ankle strength    PT Home Exercise Plan Access Code: [email or web address]  URL: [email or web address]  Date: 10/05/2021  Prepared by: Bradly Chris    Exercises  Long Sitting Calf Stretch with Strap - 1 x daily - 7 x weekly - 1 sets - 5 reps             Patient will benefit from skilled therapeutic intervention in order to improve the following deficits and impairments:  Pain, Decreased range of motion, Abnormal  gait, Difficulty walking, Hypomobility  Visit Diagnosis: Pain in right foot  Difficulty in walking, not elsewhere classified     Problem List Patient Active Problem List   Diagnosis Date Noted   Sub-Achilles bursitis, left 09/07/2021   Chronic right flank pain 06/24/2021    Acute pain of left shoulder 10/19/2020   Abnormal gait 10/19/2020   Recurrent falls 10/19/2020   Chronic intractable headache 10/19/2020   Ganglion cyst 10/19/2020   Baker's cyst of knee, right 10/19/2020   Depo-Provera contraceptive status 02/11/2020   Hydronephrosis concurrent with and due to calculi of kidney and ureter 01/16/2020   Bilateral leg weakness 10/21/2019   Depression 06/19/2019   Acute right ankle pain 06/03/2019   Educated about COVID-19 virus infection 06/03/2019   Nephrolithiasis 03/24/2018   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   Obesity, Class II, BMI 35-39.9 04/11/2017   Elevated liver enzymes 04/11/2017   Extrinsic asthma 08/15/2016   Solitary pulmonary nodule 07/03/2016   Essential hypertension, benign 04/04/2016   H/O varicella 08/19/2015   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 [place], [address] Phone: [phone]   Fax:  [phone]  Name: Margaret Zuniga MRN: [id number] Date of Birth: 22-Oct-1981

## 2021-10-11 ENCOUNTER — Ambulatory Visit: Payer: Medicare Other | Attending: Podiatry | Admitting: Physical Therapy

## 2021-10-11 DIAGNOSIS — R262 Difficulty in walking, not elsewhere classified: Secondary | ICD-10-CM | POA: Diagnosis not present

## 2021-10-11 DIAGNOSIS — M79671 Pain in right foot: Secondary | ICD-10-CM | POA: Diagnosis not present

## 2021-10-11 DIAGNOSIS — M79672 Pain in left foot: Secondary | ICD-10-CM | POA: Insufficient documentation

## 2021-10-11 NOTE — Therapy (Signed)
[place] [facility] [address] [place], [address] Phone: [phone]   Fax:  [phone]  Physical Therapy Treatment  Patient Details  Name: Margaret Zuniga MRN: [id number] Date of Birth: August 29, 1981 Referring Provider (PT): Dr. Sherryle Lis   Encounter Date: 10/11/2021   PT End of Session - 10/11/21 1634     Visit Number 2    Number of Visits 16    Date for PT Re-Evaluation 11/30/21    PT Start Time 1093    PT Stop Time 1630    PT Time Calculation (min) 45 min    Activity Tolerance Patient tolerated treatment well    Behavior During Therapy [facility] for tasks assessed/performed             Past Medical History:  Diagnosis Date   Bilateral nephrolithiasis 03/24/2018   Essential hypertension, benign 04/04/2016   Extrinsic asthma 08/15/2016   Headache due to trauma    chronic, takes, NSAIDs , imipramine, muscle relaxers (failed [facility])   History of kidney stones    Hypertension    Major depressive disorder, recurrent episode, moderate ([place]) 05/21/2013   Obesity (BMI 30.0-34.9) 04/11/2017   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 Marry Guan    Past Surgical History:  Procedure Laterality Date   ELBOW SURGERY Right [place] LITHOTRIPSY Left 01/22/2020   Procedure: EXTRACORPOREAL SHOCK WAVE LITHOTRIPSY (ESWL);  Surgeon: Abbie Sons, MD;  Location: [facility] ORS;  Service: Urology;  Laterality: Left;   EYE SURGERY  1995   KNEE ARTHROSCOPY WITH LATERAL MENISECTOMY Right 03/04/2020   Procedure: KNEE ARTHROSCOPY WITH PARTIAL LATERAL MENISECTOMY;  Surgeon: Hessie Knows, MD;  Location: [facility] ORS;  Service: Orthopedics;  Laterality: Right;   LEG SURGERY  1985   SHOULDER SURGERY     SUBACROMIAL DECOMPRESSION  2000   Right shoulder, [place]    TONSILLECTOMY  2001    There were no vitals filed for this visit.   Subjective Assessment - 10/11/21 1550     Subjective Pt reports that she was able to do exercises. Pt reports not experiencing as much heel pain when wearing sneakers and braces.    Patient is accompained by: Family member    Pertinent History 40 y.o. female presents with the above complaint. History confirmed with patient.  She wears an [place] brace on this side.  This began to worsen over the last few weeks.  Most of it is on the back of the heel and on the bottom of the heel    Limitations Lifting;Walking;House hold activities;Other (comment)    How long can you sit comfortably? unlimited    How long can you stand comfortably? unlimited    How long can you walk comfortably? A couple of minutes    Patient Stated Goals Decrease pain; be able walk 23min; stair ambulation without pain    Currently in Pain? Yes    Pain Score 3     Pain Location Foot    Pain Orientation Left    Pain Descriptors / Indicators Dull    Pain Type Chronic pain    Pain Onset More than a month ago            THEREX:  Sidelying Hip Abduction 3 x 10  Supine Bridges 3  x 10     Strength:  R/L 5/5 Hip flexion 5/5 Hip external rotation 4/5 Hip internal rotation 4/4 Hip extension  4/4 Hip abduction 5/5 Hip adduction 5/5 Knee extension 4/5 Knee flexion 5/5 Ankle Plantarflexion 5/5 Ankle Dorsiflexion 1/5 Ankle Inversion 1/5 Ankle Eversion  Straight Leg Raise increased muscular restriction between 70 and 90 degrees                 PT Short Term Goals - 10/11/21 1640       PT SHORT TERM GOAL #1   Title Patient will demonstrate understanding of home exercise plan.    Baseline 10/05/21: NT    Time 2    Period Weeks    Status New    Target Date 10/19/21               PT Long Term Goals - 10/11/21 1640       PT LONG TERM GOAL #1   Title Patient will have improved function and activity level as evidenced  by an increase in FOTO score by 10 points or more.    Baseline 10/05/21: 49/67    Time 8    Period Weeks    Status New      PT LONG TERM GOAL #2   Title Patient will demonstrate improved ankle control and gait mechanics by maintaining a neutral right ankle during initial contact to avoid increased heel contact resulting in increased heel pain.    Baseline 10/05/21: NT    Time 8    Period Weeks    Status New                   Plan - 10/11/21 1634     Clinical Impression Statement Pt presents for f/u for left heel pain. Pt exhibits deficits with hip strength and right ankle mobility that resulting from prior h/o of TBI. Due to weakness on right side, pt has a decreased loading response on RLE with midfoot and forefoot contact resulting from decreased ankle control. Given chronicity of deficits (starting age 1), changing pt's gait pattern is beyond the scope of therapy, and she will benefit more from hip strengthening and trialing an AD to offload forces through her RLE.    Personal Factors and Comorbidities Comorbidity 1;Time since onset of injury/illness/exacerbation;Past/Current Experience    Comorbidities TBI    Examination-Activity Limitations Locomotion Level    Examination-Participation Restrictions Other    Stability/Clinical Decision Making Evolving/Moderate complexity    Clinical Decision Making Moderate    Rehab Potential Fair    Clinical Impairments Affecting Rehab Potential (-)TBI with resulting prolonged nature of abnormal gait, (-) right ankle contracture , (+) Motivation, strong family support    PT Frequency 2x / week    PT Duration 8 weeks    PT Treatment/Interventions Joint Manipulations;Taping;Splinting;Manual techniques;Orthotic Fit/Training;Neuromuscular re-education;Therapeutic activities;Therapeutic exercise;Balance training;Gait training;Moist Heat;Traction;Aquatic Therapy;Cryotherapy;Dry needling;Passive range of motion    PT Next Visit Plan Continue to  progress hip strengthening exercises: Mini-squat. Trial use of AD to off load RLE.    PT Home Exercise Plan Access Code: [email or web address]  URL: [email or web address]  Date: 10/05/2021  Prepared by: Bradly Chris    Exercises  Long Sitting Calf Stretch with Strap - 1 x daily - 7 x weekly - 1 sets - 5 reps    Consulted and Agree with Plan of Care Patient            HEP includes the following:  Access Code: [email or web address] URL: [email or web address]  Date: 10/11/2021 Prepared by: Bradly Chris  Exercises Long Sitting Calf Stretch with Strap - 1 x daily - 7 x weekly - 1 sets - 5 reps Sidelying Hip Abduction - 1 x daily - 3 x weekly - 3 sets - 10 reps Supine Bridge - 1 x daily - 3 x weekly - 3 sets - 10 reps Seated Table Hamstring Stretch - 1 x daily - 7 x weekly - 1 sets - 3 reps - 30 hold    Patient will benefit from skilled therapeutic intervention in order to improve the following deficits and impairments:  Pain, Decreased range of motion, Abnormal gait, Difficulty walking, Hypomobility  Visit Diagnosis: Pain in right foot  Difficulty in walking, not elsewhere classified     Problem List Patient Active Problem List   Diagnosis Date Noted   Sub-Achilles bursitis, left 09/07/2021   Chronic right flank pain 06/24/2021   Acute pain of left shoulder 10/19/2020   Abnormal gait 10/19/2020   Recurrent falls 10/19/2020   Chronic intractable headache 10/19/2020   Ganglion cyst 10/19/2020   Baker's cyst of knee, right 10/19/2020   Depo-Provera contraceptive status 02/11/2020   Hydronephrosis concurrent with and due to calculi of kidney and ureter 01/16/2020   Bilateral leg weakness 10/21/2019   Depression 06/19/2019   Acute right ankle pain 06/03/2019   Educated about COVID-19 virus infection 06/03/2019   Nephrolithiasis 03/24/2018   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   Obesity, Class II, BMI 35-39.9 04/11/2017   Elevated liver enzymes 04/11/2017   Extrinsic asthma  08/15/2016   Solitary pulmonary nodule 07/03/2016   Essential hypertension, benign 04/04/2016   H/O varicella 08/19/2015   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 [place], [address] Phone: [phone]   Fax:  [phone]  Name: Margaret Zuniga MRN: [id number] Date of Birth: Jan 18, 1981

## 2021-10-19 ENCOUNTER — Ambulatory Visit: Payer: Medicare Other | Admitting: Physical Therapy

## 2021-10-19 DIAGNOSIS — M79672 Pain in left foot: Secondary | ICD-10-CM

## 2021-10-19 DIAGNOSIS — R262 Difficulty in walking, not elsewhere classified: Secondary | ICD-10-CM | POA: Diagnosis not present

## 2021-10-19 DIAGNOSIS — M79671 Pain in right foot: Secondary | ICD-10-CM | POA: Diagnosis not present

## 2021-10-19 NOTE — Therapy (Signed)
[facility] [address] [place], [address] Phone: [phone]   Fax:  [phone]  Physical Therapy Treatment  Patient Details  Name: Margaret Zuniga MRN: [id number] Date of Birth: [birth year] Referring Provider (PT): Dr. Sherryle Lis   Encounter Date: 10/19/2021   PT End of Session - 10/19/21 1433     Visit Number 3    Number of Visits 16    Date for PT Re-Evaluation 11/30/21    PT Start Time 1330    PT Stop Time 4401    PT Time Calculation (min) 45 min    Activity Tolerance Patient tolerated treatment well    Behavior During Therapy [facility] for tasks assessed/performed             Past Medical History:  Diagnosis Date   Bilateral nephrolithiasis 03/24/2018   Essential hypertension, benign 04/04/2016   Extrinsic asthma 08/15/2016   Headache due to trauma    chronic, takes, NSAIDs , imipramine, muscle relaxers (failed [facility])   History of kidney stones    Hypertension    Major depressive disorder, recurrent episode, moderate ([place]) 05/21/2013   Obesity (BMI 30.0-34.9) 04/11/2017   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 Marry Guan    Past Surgical History:  Procedure Laterality Date   ELBOW SURGERY Right [place] LITHOTRIPSY Left 01/22/2020   Procedure: EXTRACORPOREAL SHOCK WAVE LITHOTRIPSY (ESWL);  Surgeon: Abbie Sons, MD;  Location: [facility] ORS;  Service: Urology;  Laterality: Left;   EYE SURGERY  1995   KNEE ARTHROSCOPY WITH LATERAL MENISECTOMY Right 03/04/2020   Procedure: KNEE ARTHROSCOPY WITH PARTIAL LATERAL MENISECTOMY;  Surgeon: Hessie Knows, MD;  Location: [facility] ORS;  Service: Orthopedics;  Laterality: Right;   LEG SURGERY  1985   SHOULDER SURGERY     SUBACROMIAL DECOMPRESSION  2000   Right shoulder, [place]    TONSILLECTOMY  2001    There were no vitals filed for this visit.   Subjective Assessment - 10/19/21 1337     Subjective Pt reports increased heel pain even though she has not increased amount of walking.    Patient is accompained by: Family member    Pertinent History 40 y.o. female presents with the above complaint. History confirmed with patient.  She wears an [place] brace on this side.  This began to worsen over the last few weeks.  Most of it is on the back of the heel and on the bottom of the heel    Limitations Lifting;Walking;House hold activities;Other (comment)    How long can you sit comfortably? unlimited    How long can you stand comfortably? unlimited    How long can you walk comfortably? A couple of minutes    Patient Stated Goals Decrease pain; be able walk 68min; stair ambulation without pain    Currently in Pain? Yes    Pain Score 4     Pain Location Foot    Pain Orientation Left    Pain Descriptors / Indicators Dull    Pain Type Chronic pain    Pain Onset More than a month ago    Pain Frequency Intermittent            GAIT TRAINING:   5 MIN ON TM at 0.8 mph with BUE support  -  Pt demonstrates an extension thrust on RLE during stance phase.  -NPS 4/10   28m x 10 -Continues to demonstrate extension thrust on RLE during stance phase along with mid foot strike on LLE on initial contact skipping rockers. -PT providing support behind right knee during stance- pt reports decreased foot pain on LLE.  -NPS 4/10     THEREX:   Single leg stance 5 x 30 sec with 1 UE support   Modified SLS on cone 5 x 30 sec with intermittent UE support     Updated HEP and educated patient on changes to exercises and addition of new exercises        PT Education - 10/19/21 1339     Education provided Yes    Education Details form and technique for appropriate exercise.              PT Short Term Goals - 10/19/21 1420       PT SHORT TERM GOAL #1   Title  Patient will demonstrate understanding of home exercise plan.    Baseline 10/05/21: NT    Time 2    Period Weeks    Status On-going    Target Date 10/19/21               PT Long Term Goals - 10/19/21 1420       PT LONG TERM GOAL #1   Title Patient will have improved function and activity level as evidenced by an increase in FOTO score by 10 points or more.    Baseline 10/05/21: 49/67    Time 8    Period Weeks    Status On-going      PT LONG TERM GOAL #2   Title Patient will demonstrate improved ankle control and gait mechanics by maintaining a neutral right ankle during initial contact to avoid increased heel contact resulting in increased heel pain.    Baseline 10/05/21: NT    Time 8    Period Weeks    Status On-going                   Plan - 10/19/21 1425     Clinical Impression Statement Pt presents for f/u for left heel pain resulting from gait deficits from past TBI. Upon gait analysis, pt demonstrates an extension thrust on RLE in midstance resulting in a decreased step length on LLE and uncontrolled heel strike likely causing her left heel pain. She will benefit from a swedish knee brace on right knee to prevent knee hyperextension and to increase stability of RLE during stance to decrease pressure of initial contact on left foot, thus reducing left heel pain. PT will reach out to pt's referring provider to place order to [facility] [facility] to size pt with appropriate prosthetic.    Personal Factors and Comorbidities Comorbidity 1;Time since onset of injury/illness/exacerbation;Past/Current Experience    Comorbidities TBI    Examination-Activity Limitations Locomotion Level    Examination-Participation Restrictions Other    Stability/Clinical Decision Making Evolving/Moderate complexity    Rehab Potential Fair    Clinical Impairments Affecting Rehab Potential (-)TBI with resulting prolonged nature of abnormal gait, (-) right ankle contracture , (+) Motivation,  strong family support    PT Frequency 2x / week    PT Duration 8 weeks    PT Treatment/Interventions Joint Manipulations;Taping;Splinting;Manual techniques;Orthotic Fit/Training;Neuromuscular re-education;Therapeutic activities;Therapeutic exercise;Balance training;Gait training;Moist Heat;Traction;Aquatic Therapy;Cryotherapy;Dry needling;Passive range of motion    PT Next Visit Plan Trial use of SPC to see if  this reduces left foot pain. Progress hip and knee strengthening exercises.    PT Home Exercise Plan Access Code: [email or web address]  URL: [email or web address]  Date: 10/05/2021  Prepared by: Bradly Chris    Exercises  Long Sitting Calf Stretch with Strap - 1 x daily - 7 x weekly - 1 sets - 5 reps    Consulted and Agree with Plan of Care Patient             Patient will benefit from skilled therapeutic intervention in order to improve the following deficits and impairments:  Pain, Decreased range of motion, Abnormal gait, Difficulty walking, Hypomobility  Visit Diagnosis: Pain in left foot  Difficulty in walking, not elsewhere classified     Problem List Patient Active Problem List   Diagnosis Date Noted   Sub-Achilles bursitis, left 09/07/2021   Chronic right flank pain 06/24/2021   Acute pain of left shoulder 10/19/2020   Abnormal gait 10/19/2020   Recurrent falls 10/19/2020   Chronic intractable headache 10/19/2020   Ganglion cyst 10/19/2020   Baker's cyst of knee, right 10/19/2020   Depo-Provera contraceptive status 02/11/2020   Hydronephrosis concurrent with and due to calculi of kidney and ureter 01/16/2020   Bilateral leg weakness 10/21/2019   Depression 06/19/2019   Acute right ankle pain 06/03/2019   Educated about COVID-19 virus infection 06/03/2019   Nephrolithiasis 03/24/2018   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   Obesity, Class II, BMI 35-39.9 04/11/2017   Elevated liver enzymes 04/11/2017   Extrinsic asthma 08/15/2016   Solitary pulmonary nodule  07/03/2016   Essential hypertension, benign 04/04/2016   H/O varicella 08/19/2015   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, [place], [address] Phone: [phone]   Fax:  [phone]  Name: Margaret Zuniga MRN: [id number] Date of Birth: 1981/06/25

## 2021-10-24 ENCOUNTER — Other Ambulatory Visit: Payer: Self-pay

## 2021-10-24 ENCOUNTER — Ambulatory Visit: Payer: Medicare Other | Admitting: Physical Therapy

## 2021-10-24 DIAGNOSIS — M542 Cervicalgia: Secondary | ICD-10-CM | POA: Diagnosis not present

## 2021-10-24 DIAGNOSIS — M79672 Pain in left foot: Secondary | ICD-10-CM | POA: Diagnosis not present

## 2021-10-24 DIAGNOSIS — M79671 Pain in right foot: Secondary | ICD-10-CM | POA: Diagnosis not present

## 2021-10-24 DIAGNOSIS — R262 Difficulty in walking, not elsewhere classified: Secondary | ICD-10-CM | POA: Diagnosis not present

## 2021-10-24 DIAGNOSIS — G43719 Chronic migraine without aura, intractable, without status migrainosus: Secondary | ICD-10-CM | POA: Diagnosis not present

## 2021-10-24 DIAGNOSIS — M791 Myalgia, unspecified site: Secondary | ICD-10-CM | POA: Diagnosis not present

## 2021-10-24 NOTE — Therapy (Addendum)
[facility] [address], [place], [address] Phone: [phone]   Fax:  [phone]  Physical Therapy Treatment/ Recertification   Reporting period for episode of care: 12/01/22- 02/18/22  Patient Details  Name: Margaret Zuniga MRN: [id number] Date of Birth: 17-Dec-1980 Referring Provider (PT): Dr. 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 Marry Guan    Past Surgical History:  Procedure Laterality Date   ELBOW SURGERY Right [place] LITHOTRIPSY Left 01/22/2020   Procedure: EXTRACORPOREAL SHOCK WAVE LITHOTRIPSY (ESWL);  Surgeon: Abbie Sons, MD;  Location: [facility] ORS;  Service: Urology;  Laterality: Left;   EYE SURGERY  1995   KNEE ARTHROSCOPY WITH LATERAL MENISECTOMY Right 03/04/2020   Procedure: KNEE ARTHROSCOPY WITH PARTIAL LATERAL MENISECTOMY;  Surgeon: Hessie Knows, MD;  Location: [facility] ORS;  Service: Orthopedics;  Laterality: Right;   LEG SURGERY  1985   SHOULDER SURGERY     SUBACROMIAL DECOMPRESSION  2000   Right shoulder, [place]   TONSILLECTOMY  2001    There were no vitals filed for this visit. PT End of Session  Row Name 10/24/21 1648      PT Visits / Re-Eval  Visit Number 4      Number of Visits 16      Date for PT Re-Evaluation 11/30/21      PT Time Calculation  PT Start  Time 1630      PT Stop Time 9163      PT Time Calculation (min) 45 min      PT - End of Session  Activity Tolerance Patient tolerated treatment well      Behavior During Therapy [facility] for tasks assessed/performed        Subjective Assessment  Row Name 10/24/21 1635      Symptoms/Limitations  Subjective Pt reports no pain in left foot today, but that she did have increase pain in left foot over weekend. She is still awaiting contact from brace clinic about brace.      Patient is accompained by: Family member      Pertinent History 40 y.o. female presents with the above complaint. History confirmed with patient.  She wears an [place] brace on this side.  This began to worsen over the last few weeks.  Most of it is on the back of the heel and on the bottom of the heel      Limitations Lifting;Walking;House hold activities;Other (comment)      How long can you sit comfortably? unlimited      How long can you stand comfortably? unlimited      How long can you walk comfortably? A couple of minutes      Patient Stated Goals Decrease pain; be able walk 40min; stair ambulation without pain      Pain Assessment  Currently in Pain? Yes      Pain Score 3      Pain Location Foot      Pain Orientation Left      Pain Descriptors / Indicators Dull      Pain Type Chronic pain      Pain Onset More than a month ago          MANUAL THERAPY:  Metarsal mobilizations  Big Toe Stretch 30 sec x 4  Calf Stretch 30 sec  Calf Massage    Gait Training:   10 M x 2 with SPC without donning [place] ankle brace   -Step through gait pattern with increased left toe drag  -Pt reports 4/10 on NPS for left heel pain   10 M x 2 without rollator and without donning [place] ankle brace  -Step through gait pattern with increased left toe drag Pt reports 4/10 on NPS for left heel pain    10 M x 2 with SPC while donning [place] ankle brace  -Step through gait pattern with midfoot contact on left but no toe drag  noted  -Pt reports 4/10 on NPS for left heel pain   10 M x 2 with rollator while donning [place] ankle brace  -Step through gait pattern with midfoot contact on left but no toe drag noted  -Pt reports 4/10 on NPS for left heel pain     Plan  Clinical Impression Statement Pt presents for f/u for left heep pain resulting from gait deficits from past TBI. Pt continues to experience heel pain when ambulating even with using AD for additional UE support. She is awaiting an order from referring podiatrist for swedish knee cage which she will be fitted for at a [facility]. This brace should allow pt ability to have improved stance leg control on her RLE and allow her to better control heel strike upon initial contact of her LLE. PT instructs pt to cancel apts for next two weeks until brace could be ordered and her fitted, so that remaining PT visits could be spent focusing on training with knee brace. Pt is in agreement with this plan and she will be contacted once the order has been placed to the [facility] clinic.      Personal Factors and Comorbidities Comorbidity 1;Time since onset of injury/illness/exacerbation;Past/Current Experience      Comorbidities TBI      Examination-Activity Limitations Locomotion Level      Examination-Participation Restrictions Other      Pt will benefit from skilled therapeutic intervention in order to improve on the following deficits Pain;Decreased range of motion;Abnormal gait;Difficulty walking;Hypomobility      Stability/Clinical Decision Making Evolving/Moderate complexity      Rehab Potential Fair      Clinical Impairments Affecting Rehab Potential (-)TBI with resulting prolonged nature of abnormal gait, (-) right ankle contracture , (+) Motivation, strong family support      PT Frequency 2x / week      PT Duration 8 weeks      PT Treatment/Interventions Joint Manipulations;Taping;Splinting;Manual techniques;Orthotic Fit/Training;Neuromuscular  re-education;Therapeutic activities;Therapeutic exercise;Balance training;Gait training;Moist Heat;Traction;Aquatic Therapy;Cryotherapy;Dry needling;Passive range of motion      PT Next Visit Plan Await return fax from Dr. Maxie Barb office for swedish knee cage brace      PT Home Exercise Plan Access Code: [email or web address]  URL: [email or web address]  Date: 10/05/2021  Prepared by: Bradly Chris    Exercises  Long Sitting Calf Stretch with Strap - 1 x daily - 7 x  weekly - 1 sets - 5 reps      Consulted and Agree with Plan of Care Patient                 PT Short Term Goals - 10/24/21 1656       PT SHORT TERM GOAL #1   Title Patient will demonstrate understanding of home exercise plan.    Baseline 10/05/21: NT 11/30/21: Ongoing    Time 2    Period Weeks    Status On-going    Target Date 10/19/21               PT Long Term Goals - 10/24/21 1657       PT LONG TERM GOAL #1   Title Patient will have improved function and activity level as evidenced by an increase in FOTO score by 10 points or more.    Baseline 10/05/21: 49/67 12/21: 49/87    Time 8    Period Weeks    Status On-going    Target Date 02/18/22      PT LONG TERM GOAL #2   Title Patient will demonstrate improved ankle control and gait mechanics by maintaining a neutral right ankle during initial contact to avoid increased heel contact resulting in increased heel pain.    Baseline 10/05/21: NT 02/18/22: Increased hyperextension on RLE    Time 8    Period Weeks    Status On-going    Target Date 02/18/22                     Access Code: [email or web address] URL: [email or web address] Date: 10/24/2021 Prepared by: Bradly Chris  Exercises Long Sitting Calf Stretch with Strap - 1 x daily - 7 x weekly - 1 sets - 5 reps Sidelying Hip Abduction - 1 x daily - 3 x weekly - 3 sets - 10 reps Supine Bridge - 1 x daily - 3 x weekly - 3 sets - 10 reps Seated Table Hamstring Stretch - 1 x  daily - 7 x weekly - 1 sets - 3 reps - 30 hold Mini Squat - 1 x daily - 3 x weekly - 3 sets - 10 reps Seated Knee Extension AROM - 1 x daily - 7 x weekly - 3 sets - 10 reps Seated Toe Raise - 1 x daily - 3 x weekly - 3 sets - 10 reps   Patient will benefit from skilled therapeutic intervention in order to improve the following deficits and impairments:  Pain, Decreased range of motion, Abnormal gait, Difficulty walking, Hypomobility  Visit Diagnosis: Difficulty in walking, not elsewhere classified  Pain in left foot     Problem List Patient Active Problem List   Diagnosis Date Noted   Antibiotic-induced yeast infection 12/13/2021   Sore throat 12/13/2021   Gastroesophageal reflux disease without esophagitis 12/13/2021   Sub-Achilles bursitis, left 09/07/2021   Chronic right flank pain 06/24/2021   Acute pain of left shoulder 10/19/2020   Abnormal gait 10/19/2020   Recurrent falls 10/19/2020   Chronic intractable headache 10/19/2020   Ganglion cyst 10/19/2020   Baker's cyst of knee, right 10/19/2020   Depo-Provera contraceptive status 02/11/2020   Hydronephrosis concurrent with and due to calculi of kidney and ureter 01/16/2020   Paralysis ([place]) 12/18/2019   Bilateral leg weakness 10/21/2019   Depression 06/19/2019   Acute right ankle pain 06/03/2019   Educated about COVID-19 virus infection 06/03/2019   Nephrolithiasis 03/24/2018   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   Obesity,  Class II, BMI 35-39.9 04/11/2017   Elevated liver enzymes 04/11/2017   Extrinsic asthma 08/15/2016   Solitary pulmonary nodule 07/03/2016   Essential hypertension, benign 04/04/2016   H/O varicella 08/19/2015   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, [place], [address] Phone: [phone]   Fax:  [phone]  Name: Margaret Zuniga MRN: [id number] Date of Birth: 02-02-1981

## 2021-10-26 ENCOUNTER — Ambulatory Visit: Payer: Medicare Other | Admitting: Physical Therapy

## 2021-10-27 ENCOUNTER — Telehealth: Payer: Self-pay | Admitting: Podiatry

## 2021-10-27 NOTE — Telephone Encounter (Signed)
Patient called today stating Physical Therapy was suppose to reach out to you regarding order a knee brace for a swedish knee cage through [facility]. Pts tele number [phone]

## 2021-10-27 NOTE — Telephone Encounter (Signed)
I received it yesterday and signed and it was supposed to be faxed back to them

## 2021-10-28 NOTE — Telephone Encounter (Signed)
Called pt and let her know. Told patient to call us back if she has any issues.

## 2021-10-31 ENCOUNTER — Telehealth: Payer: Self-pay | Admitting: Physical Therapy

## 2021-10-31 NOTE — Telephone Encounter (Signed)
Pt called to inquiry about order for knee brace and that Dr. Sherryle Lis reported that he sent fax to PT clinic. PT reports that it had not been received and for her to call Dr. Maxie Barb office again to send fax again.

## 2021-11-01 ENCOUNTER — Telehealth: Payer: Self-pay | Admitting: Physical Therapy

## 2021-11-01 NOTE — Telephone Encounter (Signed)
Called to inform her that her knee brace order has been scanned into her chart and that she can schedule apt for [facility] for fitting.

## 2021-11-02 ENCOUNTER — Other Ambulatory Visit: Payer: Self-pay

## 2021-11-02 ENCOUNTER — Ambulatory Visit (INDEPENDENT_AMBULATORY_CARE_PROVIDER_SITE_OTHER): Payer: Medicare Other | Admitting: Podiatry

## 2021-11-02 DIAGNOSIS — M722 Plantar fascial fibromatosis: Secondary | ICD-10-CM

## 2021-11-02 DIAGNOSIS — M7662 Achilles tendinitis, left leg: Secondary | ICD-10-CM

## 2021-11-02 MED ORDER — METHYLPREDNISOLONE 4 MG PO TBPK: ORAL_TABLET | ORAL | 0 refills | Status: DC

## 2021-11-02 MED ORDER — NABUMETONE 500 MG PO TABS: 500.0000 mg | ORAL_TABLET | Freq: Two times a day (BID) | ORAL | 0 refills | Status: AC

## 2021-11-05 NOTE — Progress Notes (Signed)
  Subjective:  Patient ID: Margaret Zuniga, female    DOB: 1981/09/03,  MRN: [id number]  Chief Complaint  Patient presents with   Tendonitis    rm 3   for re-check Achilles tendon, recheck plantar fasciitis    40 y.o. female presents with the above complaint. History confirmed with patient.  Still having pain, PT has been helpful   Objective:  Physical Exam: warm, good capillary refill, no trophic changes or ulcerative lesions, normal DP and PT pulses, and normal sensory exam. Left Foot: point tenderness of the mid plantar fascia and tenderness at Achilles tendon insertion   Radiographs: Multiple views x-ray of the left foot: Cavus foot type mild  Cannula and posterior calcaneal spur Assessment:   1. Tendonitis, Achilles, left   2. Plantar fasciitis, left       Plan:  Patient was evaluated and treated and all questions answered.  Discussed the etiology and treatment options for Achilles tendinitis and plantar fasciitis including stretching, formal physical therapy with an eccentric exercises therapy plan, supportive shoegears such as a running shoe or sneaker, heel lifts, topical and oral medications.  We also discussed that I do not routinely perform injections in the area of the Achilles because of the risk of an increased damage or rupture of the tendon.  We also discussed the role of surgical treatment of this for patients who do not improve after exhausting non-surgical treatment options.  -Continue PT. Previously sent order for R knee brace as requested -Rx for methylprednisolone taper and nabumetone. Educated on use, risks, side effects -Repeat injection of PF next visit if no improvement    Return in about 6 weeks (around 12/14/2021) for re-check Achilles tendon, recheck plantar fasciitis.

## 2021-11-08 ENCOUNTER — Telehealth: Payer: Self-pay | Admitting: Podiatry

## 2021-11-08 ENCOUNTER — Encounter: Payer: Medicare Other | Admitting: Physical Therapy

## 2021-11-08 NOTE — Telephone Encounter (Signed)
I received a request for this from her physical therapist so that was where it was sent back to.  They should send it to [facility].  She should contact them to have them send it

## 2021-11-08 NOTE — Telephone Encounter (Signed)
The prescription was sent  , but she needs it sent to the [facility] a (swedish knee brace ?)

## 2021-11-09 ENCOUNTER — Ambulatory Visit (INDEPENDENT_AMBULATORY_CARE_PROVIDER_SITE_OTHER): Payer: Medicare Other

## 2021-11-09 VITALS — Ht 63.0 in | Wt 203.0 lb

## 2021-11-09 DIAGNOSIS — Z0000 Encounter for general adult medical examination without abnormal findings: Secondary | ICD-10-CM | POA: Diagnosis not present

## 2021-11-09 NOTE — Patient Instructions (Addendum)
Margaret Zuniga , Thank you for taking time to come for your Medicare Wellness Visit. I appreciate your ongoing commitment to your health goals. Please review the following plan we discussed and let me know if I can assist you in the future.   These are the goals we discussed:  Goals       Patient Stated     Healthy Lifestyle (pt-stated)      Stay active Stay hydrated Healthy diet        This is a list of the screening recommended for you and due dates:  Health Maintenance  Topic Date Due   Pap Smear  11/05/2021   COVID-19 Vaccine (5 - Booster for Pfizer series) 11/13/2021   Pneumococcal Vaccination (1 - PCV) 11/09/2022*   Tetanus Vaccine  09/13/2025   Flu Shot  Completed   Hepatitis C Screening: USPSTF Recommendation to screen - Ages 18-79 yo.  Completed   HIV Screening  Completed   HPV Vaccine  Aged Out  *Topic was postponed. The date shown is not the original due date.    Advanced directives: End of life planning; Advance aging; Advanced directives discussed.  Copy of current HCPOA/Living Will requested.    Conditions/risks identified: none new  Follow up in one year for your annual wellness visit.   Preventive Care 40-64 Years, Female Preventive care refers to lifestyle choices and visits with your health care provider that can promote health and wellness. What does preventive care include? A yearly physical exam. This is also called an annual well check. Dental exams once or twice a year. Routine eye exams. Ask your health care provider how often you should have your eyes checked. Personal lifestyle choices, including: Daily care of your teeth and gums. Regular physical activity. Eating a healthy diet. Avoiding tobacco and drug use. Limiting alcohol use. Practicing safe sex. Taking low-dose aspirin daily starting at age 59. Taking vitamin and mineral supplements as recommended by your health care provider. What happens during an annual well check? The services and  screenings done by your health care provider during your annual well check will depend on your age, overall health, lifestyle risk factors, and family history of disease. Counseling  Your health care provider may ask you questions about your: Alcohol use. Tobacco use. Drug use. Emotional well-being. Home and relationship well-being. Sexual activity. Eating habits. Work and work Statistician. Method of birth control. Menstrual cycle. Pregnancy history. Screening  You may have the following tests or measurements: Height, weight, and BMI. Blood pressure. Lipid and cholesterol levels. These may be checked every 5 years, or more frequently if you are over 6 years old. Skin check. Lung cancer screening. You may have this screening every year starting at age 74 if you have a 30-pack-year history of smoking and currently smoke or have quit within the past 15 years. Fecal occult blood test (FOBT) of the stool. You may have this test every year starting at age 8. Flexible sigmoidoscopy or colonoscopy. You may have a sigmoidoscopy every 5 years or a colonoscopy every 10 years starting at age 37. Hepatitis C blood test. Hepatitis B blood test. Sexually transmitted disease (STD) testing. Diabetes screening. This is done by checking your blood sugar (glucose) after you have not eaten for a while (fasting). You may have this done every 1-3 years. Mammogram. This may be done every 1-2 years. Talk to your health care provider about when you should start having regular mammograms. This may depend on whether you have a  family history of breast cancer. BRCA-related cancer screening. This may be done if you have a family history of breast, ovarian, tubal, or peritoneal cancers. Pelvic exam and Pap test. This may be done every 3 years starting at age 59. Starting at age 53, this may be done every 5 years if you have a Pap test in combination with an HPV test. Bone density scan. This is done to screen for  osteoporosis. You may have this scan if you are at high risk for osteoporosis. Discuss your test results, treatment options, and if necessary, the need for more tests with your health care provider. Vaccines  Your health care provider may recommend certain vaccines, such as: Influenza vaccine. This is recommended every year. Tetanus, diphtheria, and acellular pertussis (Tdap, Td) vaccine. You may need a Td booster every 10 years. Zoster vaccine. You may need this after age 36. Pneumococcal 13-valent conjugate (PCV13) vaccine. You may need this if you have certain conditions and were not previously vaccinated. Pneumococcal polysaccharide (PPSV23) vaccine. You may need one or two doses if you smoke cigarettes or if you have certain conditions. Talk to your health care provider about which screenings and vaccines you need and how often you need them. This information is not intended to replace advice given to you by your health care provider. Make sure you discuss any questions you have with your health care provider. Document Released: 12/24/2015 Document Revised: 08/16/2016 Document Reviewed: 09/28/2015 Elsevier Interactive Patient Education  2017 [place] Prevention in the Home Falls can cause injuries. They can happen to people of all ages. There are many things you can do to make your home safe and to help prevent falls. What can I do on the outside of my home? Regularly fix the edges of walkways and driveways and fix any cracks. Remove anything that might make you trip as you walk through a door, such as a raised step or threshold. Trim any bushes or trees on the path to your home. Use bright outdoor lighting. Clear any walking paths of anything that might make someone trip, such as rocks or tools. Regularly check to see if handrails are loose or broken. Make sure that both sides of any steps have handrails. Any raised decks and porches should have guardrails on the  edges. Have any leaves, snow, or ice cleared regularly. Use sand or salt on walking paths during winter. Clean up any spills in your garage right away. This includes oil or grease spills. What can I do in the bathroom? Use night lights. Install grab bars by the toilet and in the tub and shower. Do not use towel bars as grab bars. Use non-skid mats or decals in the tub or shower. If you need to sit down in the shower, use a plastic, non-slip stool. Keep the floor dry. Clean up any water that spills on the floor as soon as it happens. Remove soap buildup in the tub or shower regularly. Attach bath mats securely with double-sided non-slip rug tape. Do not have throw rugs and other things on the floor that can make you trip. What can I do in the bedroom? Use night lights. Make sure that you have a light by your bed that is easy to reach. Do not use any sheets or blankets that are too big for your bed. They should not hang down onto the floor. Have a firm chair that has side arms. You can use this for support while you  get dressed. Do not have throw rugs and other things on the floor that can make you trip. What can I do in the kitchen? Clean up any spills right away. Avoid walking on wet floors. Keep items that you use a lot in easy-to-reach places. If you need to reach something above you, use a strong step stool that has a grab bar. Keep electrical cords out of the way. Do not use floor polish or wax that makes floors slippery. If you must use wax, use non-skid floor wax. Do not have throw rugs and other things on the floor that can make you trip. What can I do with my stairs? Do not leave any items on the stairs. Make sure that there are handrails on both sides of the stairs and use them. Fix handrails that are broken or loose. Make sure that handrails are as long as the stairways. Check any carpeting to make sure that it is firmly attached to the stairs. Fix any carpet that is loose or  worn. Avoid having throw rugs at the top or bottom of the stairs. If you do have throw rugs, attach them to the floor with carpet tape. Make sure that you have a light switch at the top of the stairs and the bottom of the stairs. If you do not have them, ask someone to add them for you. What else can I do to help prevent falls? Wear shoes that: Do not have high heels. Have rubber bottoms. Are comfortable and fit you well. Are closed at the toe. Do not wear sandals. If you use a stepladder: Make sure that it is fully opened. Do not climb a closed stepladder. Make sure that both sides of the stepladder are locked into place. Ask someone to hold it for you, if possible. Clearly mark and make sure that you can see: Any grab bars or handrails. First and last steps. Where the edge of each step is. Use tools that help you move around (mobility aids) if they are needed. These include: Canes. Walkers. Scooters. Crutches. Turn on the lights when you go into a dark area. Replace any light bulbs as soon as they burn out. Set up your furniture so you have a clear path. Avoid moving your furniture around. If any of your floors are uneven, fix them. If there are any pets around you, be aware of where they are. Review your medicines with your doctor. Some medicines can make you feel dizzy. This can increase your chance of falling. Ask your doctor what other things that you can do to help prevent falls. This information is not intended to replace advice given to you by your health care provider. Make sure you discuss any questions you have with your health care provider. Document Released: 09/23/2009 Document Revised: 05/04/2016 Document Reviewed: 01/01/2015 Elsevier Interactive Patient Education  2017 [employer].

## 2021-11-09 NOTE — Progress Notes (Signed)
Subjective:   Margaret Zuniga is a 40 y.o. female who presents for Medicare Annual (Subsequent) preventive examination.  Review of Systems    No ROS.  Medicare Wellness Virtual Visit.  Visual/audio telehealth visit, UTA vital signs.   See social history for additional risk factors.         Objective:    Today's Vitals   11/09/21 1320  Weight: 203 lb (92.1 kg)  Height: 5\' 3"  (1.6 m)   Body mass index is 35.96 kg/m.  Advanced Directives 11/09/2021 10/05/2021 09/27/2020 03/04/2020 02/26/2020 09/25/2019 09/06/2018  Does Patient Have a Medical Advance Directive? Yes Yes No No Yes Yes No  Type of Paramedic of [place];Living will Living will - - Healthcare Power of [place] -  Does patient want to make changes to medical advance directive? No - Patient declined No - Patient declined - - - No - Patient declined -  Copy of [place] in Chart? No - copy requested - - - No - copy requested - -  Would patient like information on creating a medical advance directive? - No - Patient declined No - Patient declined No - Patient declined - - Yes (MAU/Ambulatory/Procedural Areas - Information given)  Pre-existing out of facility DNR order (yellow form or pink MOST form) - - - - - - -    Current Medications (verified) Outpatient Encounter Medications as of 11/09/2021  Medication Sig   Ascorbic Acid (VITAMIN C) 500 MG CAPS Take 1 tablet by mouth.   Cholecalciferol (VITAMIN D3) 25 MCG (1000 UT) CAPS Take 1 capsule by mouth.   MAGNESIUM PO Take 500 mg by mouth.   Acetaminophen-Caffeine 500-65 MG TABS Take 1 tablet by mouth in the morning, at noon, and at bedtime.    albuterol (VENTOLIN HFA) 108 (90 Base) MCG/ACT inhaler TAKE 2 PUFFS BY MOUTH EVERY 6 HOURS AS NEEDED FOR WHEEZE OR SHORTNESS OF BREATH (Patient taking differently: Inhale 2 puffs into the lungs every 6 (six) hours as needed for wheezing or shortness of breath.)    amLODipine (NORVASC) 10 MG tablet Take 1 tablet (10 mg total) by mouth daily.   Atogepant (QULIPTA) 60 MG TABS    baclofen (LIORESAL) 10 MG tablet Take 10 mg by mouth 2 (two) times daily as needed.   carbamazepine (CARBATROL) 300 MG 12 hr capsule Take 300 mg by mouth in the morning, at noon, and at bedtime.    cetirizine (ZYRTEC) 10 MG tablet Take 10 mg by mouth daily.   cyclobenzaprine (FLEXERIL) 5 MG tablet Take 1 tablet (5 mg total) by mouth 3 (three) times daily as needed for muscle spasms.   diphenhydrAMINE HCl, Sleep, (ZZZQUIL PO) Take by mouth at bedtime.   escitalopram (LEXAPRO) 10 MG tablet TAKE 1 TABLET BY MOUTH EVERY DAY   fluticasone furoate-vilanterol (BREO ELLIPTA) 100-25 MCG/INH AEPB Inhale into the lungs.   Lactulose 20 GM/30ML SOLN 30 ml every 4 hours until constipation is relieved   medroxyPROGESTERone (DEPO-PROVERA) 150 MG/ML injection Inject 1 mL (150 mg total) into the muscle every 3 (three) months.   methylPREDNISolone (MEDROL DOSEPAK) 4 MG TBPK tablet 6 day dose pack - take as directed   metoprolol succinate (TOPROL-XL) 100 MG 24 hr tablet TAKE 1 TABLET BY MOUTH DAILY. TAKE WITH OR IMMEDIATELY FOLLOWING A MEAL.   Multiple Vitamins-Minerals (CENTRUM WOMEN PO) Take by mouth daily in the afternoon.   nabumetone (RELAFEN) 500 MG tablet Take 1 tablet (500 mg  total) by mouth 2 (two) times daily.   No facility-administered encounter medications on file as of 11/09/2021.    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 Marry Guan   Past Surgical History:  Procedure Laterality Date   ELBOW SURGERY Right [place] LITHOTRIPSY Left 01/22/2020   Procedure: EXTRACORPOREAL SHOCK WAVE LITHOTRIPSY (ESWL);  Surgeon: Abbie Sons, MD;  Location: [facility] ORS;  Service: Urology;  Laterality: Left;   EYE SURGERY  1995   KNEE ARTHROSCOPY WITH LATERAL MENISECTOMY Right 03/04/2020   Procedure: KNEE ARTHROSCOPY WITH PARTIAL LATERAL MENISECTOMY;  Surgeon: Hessie Knows, MD;  Location: [facility] ORS;  Service: Orthopedics;  Laterality: Right;   LEG SURGERY  1985   SHOULDER SURGERY     SUBACROMIAL DECOMPRESSION  2000   Right shoulder, [place]   TONSILLECTOMY  2001   Family History  Problem Relation Age of Onset   Diabetes Mother    Coronary artery disease Mother    Hyperlipidemia Mother    Hypertension Mother    Parkinson's disease Mother    Heart disease Maternal Grandfather    Stroke Father    Social History   Socioeconomic History   Marital status: Single    Spouse name: Not on file   Number of children: Not on file   Years of education: Not on file   Highest education level: Not on file  Occupational History   Not on file  Tobacco Use   Smoking status: Never   Smokeless tobacco: Never  Vaping Use   Vaping Use: Never used  Substance and Sexual Activity   Alcohol use: No   Drug use: No   Sexual activity: Yes    Birth control/protection: Injection  Other Topics Concern   Not on file  Social History Narrative   Not on file   Social Determinants of Health   Financial Resource Strain: Low Risk    Difficulty of Paying Living Expenses: Not hard at all  Food Insecurity: No Food Insecurity   Worried About Charity fundraiser in the Last Year: Never true   [place] in the Last Year: Never true  Transportation Needs: No  Transportation Needs   Lack of Transportation (Medical): No   Lack of Transportation (Non-Medical): No  Physical Activity: Unknown   Days of Exercise per Week: 2 days   Minutes of Exercise per Session: Not on file  Stress: No Stress Concern Present   Feeling of Stress : Not at all  Social Connections: Unknown   Frequency of Communication with Friends and Family: Not on file   Frequency of Social Gatherings with Friends and Family: More than three times a week   Attends Religious Services: Not on file   Active Member of Clubs or Organizations: Not on file   Attends Archivist Meetings: Not on file   Marital  Status: Never married    Tobacco Counseling Counseling given: Not Answered   Clinical Intake:  Pre-visit preparation completed: Yes        Diabetes: No  How often do you need to have someone help you when you read instructions, pamphlets, or other written materials from your doctor or pharmacy?: 3 - Sometimes    Interpreter Needed?: No      Activities of Daily Living In your present state of health, do you have any difficulty performing the following activities: 11/09/2021  Hearing? N  Vision? N  Difficulty concentrating or making decisions? Y  Walking or climbing stairs? Y  Comment Paces self. Cane in use as needed.  Dressing or bathing? N  Doing errands, shopping? Y  Comment Does not drive  Preparing Food and eating ? Y  Comment Family assist with meal prep. Self feeds.  Using the Toilet? N  In the past six months, have you accidently leaked urine? N  Do you have problems with loss of bowel control? N  Managing your Medications? N  Managing your Finances? Y  Comment Family assist  Housekeeping or managing your Housekeeping? Y  Comment Family assist  Some recent data might be hidden    Patient Care Team: Crecencio Mc, MD as PCP - General (Internal Medicine)  Indicate any recent Medical Services you may have received from other than  [facility] providers in the past year (date may be approximate).     Assessment:   This is a routine wellness examination for Kylin.  Virtual Visit via Telephone Note I connected with  Margaret Zuniga on 11/09/21 at  1:15 PM EST by telephone and verified that I am speaking with the correct person using two identifiers.  Location: Patient: home Provider: office Persons participating in the virtual visit: patient/Nurse Health Advisor   I discussed the limitations, risks, security and privacy concerns of performing an evaluation and management service by telephone and the availability of in person appointments. The patient expressed understanding and agreed to proceed.  Interactive audio and video telecommunications were attempted between this nurse and patient, however failed, due to patient having technical difficulties OR patient did not have access to video capability.  We continued and completed visit with audio only.  Some vital signs may be absent or patient reported.   Hearing/Vision screen Hearing Screening - Comments:: Patient is able to hear conversational tones without difficulty.  No issues reported. Vision Screening - Comments:: Follow[facility]  She does not wear glasses  Dietary issues and exercise activities discussed: Current Exercise Habits: Home exercise routine, Type of exercise: walking, Intensity: Mild  Regular diet Good fluid intake  Goals Addressed               This Visit's Progress     Patient Stated     Healthy Lifestyle (pt-stated)        Stay active Stay hydrated Healthy diet       Depression Screen PHQ 2/9 Scores 11/09/2021 09/07/2021 06/22/2021 06/22/2021 05/23/2021 09/27/2020 09/25/2019  PHQ - 2 Score 0 1 2 2  0 0 0  PHQ- 9 Score - - 3 - - - -    Fall Risk Fall Risk  11/09/2021 09/07/2021 06/22/2021 05/23/2021 10/19/2020  Falls in the past year? 0 1 1 0 1  Number falls in past yr: 0 1 1 0 1  Comment - - - - -  Injury with Fall? - 0 0 0  1  Risk for  fall due to : - History of fall(s) - - Impaired balance/gait;Impaired mobility  Follow up Falls evaluation completed Falls evaluation completed Falls evaluation completed Falls evaluation completed Falls evaluation completed    FALL RISK PREVENTION PERTAINING TO THE HOME: Home free of loose throw rugs in walkways, pet beds, electrical cords, etc? Yes  Adequate lighting in your home to reduce risk of falls? Yes   ASSISTIVE DEVICES UTILIZED TO PREVENT FALLS: Life alert? No  Use of a cane, walker or w/c? Yes , cane as needed  TIMED UP AND GO: Was the test performed? No .   Cognitive Function: MMSE - Mini Mental State Exam 09/05/2017  Orientation to time 5  Orientation to Place 5  Registration 3  Attention/ Calculation 5  Recall 3  Language- name 2 objects 2  Language- repeat 1  Language- follow 3 step command 3  Language- read & follow direction 1  Write a sentence 1  Copy design 1  Total score 30     6CIT Screen 09/27/2020 09/25/2019 09/06/2018  What Year? 0 points 0 points 0 points  What month? 0 points 0 points 0 points  What time? - 0 points 0 points  Count back from 20 - 0 points 0 points  Months in reverse 0 points 0 points 0 points  Repeat phrase - 0 points 0 points  Total Score - 0 0    Immunizations Immunization History  Administered Date(s) Administered   Influenza Split 09/01/2013   Influenza,inj,Quad PF,6+ Mos 09/13/2017, 09/01/2019   Influenza-Unspecified 08/21/2014, 08/31/2015, 08/18/2016, 08/22/2018, 09/18/2020, 08/27/2021   PFIZER(Purple Top)SARS-COV-2 Vaccination 03/15/2020, 04/07/2020, 12/20/2020, 09/18/2021   Tdap 09/14/2015   Pneumococcal vaccine status: Due, Education has been provided regarding the importance of this vaccine. Advised may receive this vaccine at local pharmacy or Health Dept. Aware to provide a copy of the vaccination record if obtained from local pharmacy or Health Dept. Verbalized acceptance and understanding.  Deferred.  Screening Tests Health Maintenance  Topic Date Due   PAP SMEAR-Modifier  11/05/2021   COVID-19 Vaccine (5 - Booster for Pfizer series) 11/13/2021   Pneumococcal Vaccine 21-30 Years old (1 - PCV) 11/09/2022 (Originally [birth year]1984)   TETANUS/TDAP  09/13/2025   INFLUENZA VACCINE  Completed   Hepatitis C Screening  Completed   HIV Screening  Completed   HPV VACCINES  Aged Out    Health Maintenance Health Maintenance Due  Topic Date Due   PAP SMEAR-Modifier  11/05/2021   COVID-19 Vaccine (5 - Booster for Pfizer series) 11/13/2021   Lung Cancer Screening: (Low Dose CT Chest recommended if Age 32-80 years, 30 pack-year currently smoking OR have quit w/in 15years.) does not qualify.   Vision Screening: Recommended annual ophthalmology exams for early detection of glaucoma and other disorders of the eye.  Dental Screening: Recommended annual dental exams for proper oral hygiene  Community Resource Referral / Chronic Care Management: CRR required this visit?  No   CCM required this visit?  No      Plan:   Keep all routine maintenance appointments.   I have personally reviewed and noted the following in the patient's chart:   Medical and social history Use of alcohol, tobacco or illicit drugs  Current medications and supplements including opioid prescriptions. Not taking opioid.  Functional ability and status Nutritional status Physical activity Advanced directives List of other physicians Hospitalizations, surgeries, and ER visits in previous 12 months Vitals Screenings to include cognitive, depression, and falls Referrals and appointments  In addition, I  have reviewed and discussed with patient certain preventive protocols, quality metrics, and best practice recommendations. A written personalized care plan for preventive services as well as general preventive health recommendations were provided to patient.     Varney Biles, LPN   [phone]

## 2021-11-10 ENCOUNTER — Telehealth: Payer: Self-pay | Admitting: Physical Therapy

## 2021-11-10 ENCOUNTER — Ambulatory Visit: Payer: Medicare Other | Attending: Podiatry | Admitting: Physical Therapy

## 2021-11-10 ENCOUNTER — Other Ambulatory Visit: Payer: Self-pay | Admitting: Obstetrics and Gynecology

## 2021-11-10 DIAGNOSIS — Z1231 Encounter for screening mammogram for malignant neoplasm of breast: Secondary | ICD-10-CM

## 2021-11-10 DIAGNOSIS — R262 Difficulty in walking, not elsewhere classified: Secondary | ICD-10-CM | POA: Insufficient documentation

## 2021-11-10 DIAGNOSIS — M79672 Pain in left foot: Secondary | ICD-10-CM | POA: Insufficient documentation

## 2021-11-10 NOTE — Telephone Encounter (Signed)
Called pt to inquiry about whether she had gone to [facility] to get knee brace. Instructed pt to call back to check in about brace and to inform clinic about whether she would be attending her appointment.

## 2021-11-10 NOTE — Therapy (Signed)
PATIENT SCREEN   Briefly met with pt to discuss her plan of care. She informed me that she still has not received her sw[place] knee cage brace from [facility], and that her next apt is on December 14th. Given that her progress is contingent on use of right knee brace, PT recommends that pt cancel remaining PT visits before 14th and reschedule them until after the 14th to maximize sessions and determine whether knee brace is making a difference in reducing right foot pain. Pt is in agreement with this plan, and she agreed to resume PT after meeting with [facility] on the 14th. Her scheduled was adjusted accordingly.

## 2021-11-12 ENCOUNTER — Other Ambulatory Visit: Payer: Self-pay | Admitting: Internal Medicine

## 2021-11-14 ENCOUNTER — Encounter: Payer: Medicare Other | Admitting: Physical Therapy

## 2021-11-15 ENCOUNTER — Encounter: Payer: Medicare Other | Admitting: Physical Therapy

## 2021-11-16 ENCOUNTER — Encounter: Payer: Medicare Other | Admitting: Physical Therapy

## 2021-11-17 ENCOUNTER — Other Ambulatory Visit: Payer: Self-pay

## 2021-11-17 ENCOUNTER — Ambulatory Visit (INDEPENDENT_AMBULATORY_CARE_PROVIDER_SITE_OTHER): Payer: Medicare Other | Admitting: Obstetrics and Gynecology

## 2021-11-17 ENCOUNTER — Encounter: Payer: Self-pay | Admitting: Obstetrics and Gynecology

## 2021-11-17 VITALS — BP 118/92 | HR 98 | Ht 63.0 in | Wt 205.7 lb

## 2021-11-17 DIAGNOSIS — Z01419 Encounter for gynecological examination (general) (routine) without abnormal findings: Secondary | ICD-10-CM

## 2021-11-17 DIAGNOSIS — Z131 Encounter for screening for diabetes mellitus: Secondary | ICD-10-CM | POA: Diagnosis not present

## 2021-11-17 DIAGNOSIS — Z3042 Encounter for surveillance of injectable contraceptive: Secondary | ICD-10-CM | POA: Diagnosis not present

## 2021-11-17 MED ORDER — ALBUTEROL SULFATE HFA 108 (90 BASE) MCG/ACT IN AERS: 2.0000 | INHALATION_SPRAY | Freq: Four times a day (QID) | RESPIRATORY_TRACT | Status: DC | PRN

## 2021-11-17 NOTE — Progress Notes (Signed)
GYNECOLOGY ANNUAL PHYSICAL EXAM PROGRESS NOTE  Subjective:    Margaret Zuniga is a 40 y.o. [place] female who presents for an annual exam. The patient has no complaints today. The patient is sexually active. The patient wears seatbelts: yes. The patient participates in regular exercise: no. Has the patient ever been transfused or tattooed?: no.  She states that she is scheduled for her bone density scan in the middle of this month for her bone density scan (due to long term Depo Provera use). She notes a history of COVID infection in September.    Gynecologic History Menarche age: 59 No LMP recorded. Patient has had an injection. Contraception: Depo-Provera injections History of STI's:  Denies Last Pap: 10/2018. Results were: normal.  Denies h/o abnormal pap smears Last mammogram: 11/24/2020. Results were: normal.       11/15/2021   1119  Pregnancy Intention Screening   Does the patient want to become pregnant in the next year? No  Does the patient's partner want to become pregnant in the next year? No  Does the patient currently take folic acid or women's MVI, or a prenatal viitamin? Yes  Does the patient or their partner want to learn more about planning a healthy pregnancy? No  Would the patient like to discuss contraceptive options today? No  Contraception Wrap Up   Current Method Hormonal Injection  End Method Hormonal Injection  Contraception Counseling Provided No    The pregnancy intention screening data noted above was reviewed. Potential methods of contraception were discussed. The patient elected to proceed with Hormonal Injection.    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 Marry Guan    Past Surgical History:  Procedure Laterality Date   ELBOW SURGERY Right [place] LITHOTRIPSY Left 01/22/2020   Procedure: EXTRACORPOREAL SHOCK WAVE LITHOTRIPSY (ESWL);  Surgeon: Abbie Sons, MD;  Location: [facility] ORS;  Service: Urology;  Laterality: Left;   EYE SURGERY  1995   KNEE ARTHROSCOPY WITH LATERAL MENISECTOMY Right 03/04/2020   Procedure: KNEE ARTHROSCOPY WITH PARTIAL LATERAL MENISECTOMY;  Surgeon: Hessie Knows, MD;  Location: [facility] OR[facility];  Service: Orthopedics;  Laterality: Right;   LEG SURGERY  1985   SHOULDER SURGERY     SUBACROMIAL DECOMPRESSION  2000   Right shoulder, [place]   TONSILLECTOMY  2001    Family History  Problem Relation Age of Onset   Diabetes Mother    Coronary artery disease Mother    Hyperlipidemia Mother    Hypertension Mother    Parkinson's disease Mother    Heart disease Maternal Grandfather    Stroke Father     Social History   Socioeconomic History   Marital status: Single  Spouse name: Not on file   Number of children: Not on file   Years of education: Not on file   Highest education level: Not on file  Occupational History   Not on file  Tobacco Use   Smoking status: Never   Smokeless tobacco: Never  Vaping Use   Vaping Use: Never used  Substance and Sexual Activity   Alcohol use: No   Drug use: No   Sexual activity: Yes    Birth control/protection: Injection  Other Topics Concern   Not on file  Social History Narrative   Not on file   Social Determinants of Health   Financial Resource Strain: Low Risk     Difficulty of Paying Living Expenses: Not hard at all  Food Insecurity: No Food Insecurity   Worried About Charity fundraiser in the Last Year: Never true   [place] in the Last Year: Never true  Transportation Needs: No Transportation Needs   Lack of Transportation (Medical): No   Lack of Transportation (Non-Medical): No  Physical Activity: Unknown   Days of Exercise per Week: 2 days   Minutes of Exercise per Session: Not on file  Stress: No Stress Concern Present   Feeling of Stress : Not at all  Social Connections: Unknown   Frequency of Communication with Friends and Family: Not on file   Frequency of Social Gatherings with Friends and Family: More than three times a week   Attends Religious Services: Not on Electrical engineer or Organizations: Not on file   Attends Archivist Meetings: Not on file   Marital Status: Never married  Intimate Partner Violence: Not At Risk   Fear of Current or Ex-Partner: No   Emotionally Abused: No   Physically Abused: No   Sexually Abused: No    Current Outpatient Medications on File Prior to Visit  Medication Sig Dispense Refill   Acetaminophen-Caffeine 500-65 MG TABS Take 1 tablet by mouth in the morning, at noon, and at bedtime.      albuterol (VENTOLIN HFA) 108 (90 Base) MCG/ACT inhaler TAKE 2 PUFFS BY MOUTH EVERY 6 HOURS AS NEEDED FOR WHEEZE OR SHORTNESS OF BREATH (Patient taking differently: Inhale 2 puffs into the lungs every 6 (six) hours as needed for wheezing or shortness of breath.) 18 g 0   amLODipine (NORVASC) 10 MG tablet Take 1 tablet (10 mg total) by mouth daily. 90 tablet 1   Ascorbic Acid (VITAMIN C) 500 MG CAPS Take 1 tablet by mouth.     Atogepant (QULIPTA) 60 MG TABS      baclofen (LIORESAL) 10 MG tablet Take 10 mg by mouth 2 (two) times daily as needed.     carbamazepine (CARBATROL) 300 MG 12 hr capsule Take 300 mg by mouth in the morning, at noon, and at bedtime.   1   cetirizine (ZYRTEC) 10  MG tablet Take 10 mg by mouth daily.     Cholecalciferol (VITAMIN D3) 25 MCG (1000 UT) CAPS Take 1 capsule by mouth.     cyclobenzaprine (FLEXERIL) 5 MG tablet Take 1 tablet (5 mg total) by mouth 3 (three) times daily as needed for muscle spasms. 30 tablet 1   diphenhydrAMINE HCl, Sleep, (ZZZQUIL PO) Take by mouth at bedtime.     escitalopram (LEXAPRO) 10 MG tablet TAKE 1 TABLET BY MOUTH EVERY DAY 90 tablet 0   fluticasone furoate-vilanterol (BREO ELLIPTA) 100-25 MCG/INH AEPB Inhale into the lungs.  Lactulose 20 GM/30ML SOLN 30 ml every 4 hours until constipation is relieved 236 mL 3   MAGNESIUM PO Take 500 mg by mouth.     medroxyPROGESTERone (DEPO-PROVERA) 150 MG/ML injection Inject 1 mL (150 mg total) into the muscle every 3 (three) months. 1 mL 3   methylPREDNISolone (MEDROL DOSEPAK) 4 MG TBPK tablet 6 day dose pack - take as directed 21 tablet 0   metoprolol succinate (TOPROL-XL) 100 MG 24 hr tablet TAKE 1 TABLET BY MOUTH EVERY DAY WITH OR IMMEDIATELY FOLLOWING A MEAL 90 tablet 0   Multiple Vitamins-Minerals (CENTRUM WOMEN PO) Take by mouth daily in the afternoon.     nabumetone (RELAFEN) 500 MG tablet Take 1 tablet (500 mg total) by mouth 2 (two) times daily. 60 tablet 0   No current facility-administered medications on file prior to visit.    Allergies  Allergen Reactions   Zonegran [Zonisamide] Rash     Review of Systems Constitutional: negative for chills, fatigue, fevers and sweats Eyes: negative for irritation, redness and visual disturbance Ears, nose, mouth, throat, and face: negative for hearing loss, nasal congestion, snoring and tinnitus Respiratory: negative for asthma, cough, sputum Cardiovascular: negative for chest pain, dyspnea, exertional chest pressure/discomfort, irregular heart beat, palpitations and syncope Gastrointestinal: negative for abdominal pain, change in bowel habits, nausea and vomiting Genitourinary: negative for abnormal menstrual periods,  genital lesions, sexual problems and vaginal discharge, dysuria and urinary incontinence Integument/breast: negative for breast lump, breast tenderness and nipple discharge Hematologic/lymphatic: negative for bleeding and easy bruising Musculoskeletal:negative for back pain and muscle weakness Neurological: negative for dizziness, headaches, vertigo and weakness Endocrine: negative for diabetic symptoms including polydipsia, polyuria and skin dryness Allergic/Immunologic: negative for hay fever and urticaria        Objective:  There were no vitals taken for this visit. There is no height or weight on file to calculate BMI.  General Appearance:    Alert, cooperative, no distress, appears stated age, moderately obese  Head:    Normocephalic, without obvious abnormality, atraumatic  Eyes:    PERRL, conjunctiva/corneas clear, EOM's intact, both eyes  Ears:    Normal external ear canals, both ears  Nose:   Nares normal, septum midline, mucosa normal, no drainage or sinus tenderness  Throat:   Lips, mucosa, and tongue normal; teeth and gums normal  Neck:   Supple, symmetrical, trachea midline, no adenopathy; thyroid: no enlargement/tenderness/nodules; no carotid bruit or JVD  Back:     Symmetric, no curvature, ROM normal, no CVA tenderness  Lungs:     Clear to auscultation bilaterally, respirations unlabored  Chest Wall:    No tenderness or deformity   Heart:    Regular rate and rhythm, S1 and S2 normal, no murmur, rub or gallop  Breast Exam:    No tenderness, masses, or nipple abnormality  Abdomen:     Soft, non-tender, bowel sounds active all four quadrants, no masses, no organomegaly.    Genitalia:    Pelvic:external genitalia normal, vagina without lesions, discharge, or tenderness, rectovaginal septum  normal. Cervix normal in appearance, no cervical motion tenderness, no adnexal masses or tenderness.  Uterus normal size, shape, mobile, regular contours, nontender.  Rectal:    Normal  external sphincter.  No hemorrhoids appreciated. Internal exam not done.   Extremities:   Extremities normal, atraumatic, no cyanosis or edema  Pulses:   2+ and symmetric all extremities  Skin:   Skin color, texture, turgor normal, no rashes or lesions  Lymph nodes:  Cervical, supraclavicular, and axillary nodes normal  Neurologic:  Grossly intact.  Partial paralysis of left side (face, upper and lower limb)   .  Labs:  Lab Results  Component Value Date   WBC 6.7 05/23/2021   HGB 13.9 05/23/2021   HCT 42.1 05/23/2021   MCV 92.8 05/23/2021   PLT 315.0 05/23/2021    Lab Results  Component Value Date   CREATININE 0.78 05/23/2021   BUN 13 05/23/2021   NA 141 05/23/2021   K 4.2 05/23/2021   CL 106 05/23/2021   CO2 25 05/23/2021    Lab Results  Component Value Date   ALT 20 05/23/2021   AST 16 05/23/2021   ALKPHOS 78 05/23/2021   BILITOT 0.4 05/23/2021    Lab Results  Component Value Date   TSH 1.770 11/11/2020    Lab Results  Component Value Date   HGBA1C 5.1 11/11/2020   Lab Results  Component Value Date   CHOL 181 05/15/2019   HDL 57.70 05/15/2019   LDLCALC 103 (H) 05/15/2019   LDLDIRECT 109.0 04/17/2016   TRIG 102.0 05/15/2019   CHOLHDL 3 05/15/2019     Assessment:   Healthy female exam.  Moderate obesity  HTN Depo Provera contraception   Plan:    Labs: up to date, performed by PCP Breast self exam technique reviewed and patient encouraged to perform self-exam monthly. Contraception: Depo-Provera injections. Had normal Dexa Scan last year for history of prolonged use.  Discussed healthy lifestyle modifications. Mammogram: scheduled for next week.   Pap smear up to date. Discussed option of continued 3 year vs 5 year surveillance with HPV co-testing. Patient ok to do 5 years. Next due in 2024.  HTN  management as recommended by PCP.  Up to date on flu vaccine.  COVID vaccination status: completed.  Can receive booster when eligible.  Follow up  in 1 year for annual exam.    Rubie Maid, MD [facility]

## 2021-11-18 LAB — COMPREHENSIVE METABOLIC PANEL
ALT: 26 IU/L (ref 0–32)
AST: 21 IU/L (ref 0–40)
Albumin/Globulin Ratio: 2.3 — ABNORMAL HIGH (ref 1.2–2.2)
Albumin: 4.9 g/dL — ABNORMAL HIGH (ref 3.8–4.8)
Alkaline Phosphatase: 93 IU/L (ref 44–121)
BUN/Creatinine Ratio: 15 (ref 9–23)
BUN: 12 mg/dL (ref 6–24)
Bilirubin Total: <0.2 mg/dL (ref 0.0–1.2)
CO2: 18 mmol/L — ABNORMAL LOW (ref 20–29)
Calcium: 9.5 mg/dL (ref 8.7–10.2)
Chloride: 107 mmol/L — ABNORMAL HIGH (ref 96–106)
Creatinine, Ser: 0.8 mg/dL (ref 0.57–1.00)
Globulin, Total: 2.1 g/dL (ref 1.5–4.5)
Glucose: 99 mg/dL (ref 70–99)
Potassium: 4.3 mmol/L (ref 3.5–5.2)
Sodium: 142 mmol/L (ref 134–144)
Total Protein: 7 g/dL (ref 6.0–8.5)
eGFR: 94 mL/min/{1.73_m2} (ref >59)

## 2021-11-18 LAB — CBC
Hematocrit: 42.5 % (ref 34.0–46.6)
Hemoglobin: 14.6 g/dL (ref 11.1–15.9)
MCH: 31.2 pg (ref 26.6–33.0)
MCHC: 34.4 g/dL (ref 31.5–35.7)
MCV: 91 fL (ref 79–97)
Platelets: 326 10*3/uL (ref 150–450)
RBC: 4.68 x10E6/uL (ref 3.77–5.28)
RDW: 12.2 % (ref 11.7–15.4)
WBC: 6.1 10*3/uL (ref 3.4–10.8)

## 2021-11-18 LAB — HEMOGLOBIN A1C
Est. average glucose Bld gHb Est-mCnc: 105 mg/dL
Hgb A1c MFr Bld: 5.3 % (ref 4.8–5.6)

## 2021-11-21 ENCOUNTER — Encounter: Payer: Medicare Other | Admitting: Physical Therapy

## 2021-11-23 ENCOUNTER — Encounter: Payer: Medicare Other | Admitting: Physical Therapy

## 2021-11-23 ENCOUNTER — Telehealth: Payer: Self-pay | Admitting: Podiatry

## 2021-11-23 NOTE — Telephone Encounter (Signed)
Patient called stating she has an appt on the 4th with Dr Guy Sandifer. Patient states she ordered  a swedish knee cage and it has not come in yet. Patient states she is picking it up on the 6th. Patient wanted to know if you want to see her after she gets her knee cage.

## 2021-11-23 NOTE — Telephone Encounter (Signed)
Yes let's move it back to the 23 or 25 of January so she has it a few weeks

## 2021-11-24 NOTE — Telephone Encounter (Signed)
Called pt and rescheduled her for 01.25.2023 at 3:15 pm.

## 2021-11-28 ENCOUNTER — Ambulatory Visit: Payer: Medicare Other | Admitting: Physical Therapy

## 2021-11-28 ENCOUNTER — Telehealth: Payer: Self-pay | Admitting: Internal Medicine

## 2021-11-28 NOTE — Telephone Encounter (Signed)
Pt has been scheduled with Sharyn Lull, NP tomorrow.

## 2021-11-28 NOTE — Telephone Encounter (Signed)
Pt Partner called in stating that Pt may have caught virus that others have in the household. Pt partner stated that Pt is having symptoms of congestion and sore throat. Pt partner stated that he doesn't know if there are any other symptoms she is having. No avail appts, Transfer Pt to access nurse.

## 2021-11-29 ENCOUNTER — Other Ambulatory Visit: Payer: Self-pay

## 2021-11-29 ENCOUNTER — Encounter: Payer: Self-pay | Admitting: Adult Health

## 2021-11-29 ENCOUNTER — Ambulatory Visit (INDEPENDENT_AMBULATORY_CARE_PROVIDER_SITE_OTHER): Payer: Medicare Other

## 2021-11-29 ENCOUNTER — Ambulatory Visit (INDEPENDENT_AMBULATORY_CARE_PROVIDER_SITE_OTHER): Payer: Medicare Other | Admitting: Adult Health

## 2021-11-29 ENCOUNTER — Ambulatory Visit: Payer: Medicare Other | Admitting: Adult Health

## 2021-11-29 VITALS — BP 142/96 | HR 114 | Temp 96.6°F | Ht 62.99 in | Wt 204.4 lb

## 2021-11-29 DIAGNOSIS — J020 Streptococcal pharyngitis: Secondary | ICD-10-CM | POA: Diagnosis not present

## 2021-11-29 DIAGNOSIS — R059 Cough, unspecified: Secondary | ICD-10-CM | POA: Diagnosis not present

## 2021-11-29 DIAGNOSIS — R051 Acute cough: Secondary | ICD-10-CM | POA: Diagnosis not present

## 2021-11-29 LAB — POCT INFLUENZA A/B
Influenza A, POC: NEGATIVE
Influenza B, POC: NEGATIVE

## 2021-11-29 LAB — POCT RAPID STREP A (OFFICE): Rapid Strep A Screen: POSITIVE — AB

## 2021-11-29 LAB — POC COVID19 BINAXNOW: SARS Coronavirus 2 Ag: NEGATIVE

## 2021-11-29 MED ORDER — AMOXICILLIN-POT CLAVULANATE 875-125 MG PO TABS: 1.0000 | ORAL_TABLET | Freq: Two times a day (BID) | ORAL | 0 refills | Status: DC

## 2021-11-29 NOTE — Progress Notes (Signed)
Acute Office Visit  Subjective:    Patient ID: Margaret Zuniga, female    DOB: 01/13/1981, 40 y.o.   MRN: [id number]  Chief Complaint  Patient presents with   URI    Pt having symptoms of congestion, sore throat, sinus drainage, sneezing and a cough. Symptoms started 12/18    HPI Patient is in today for very sore throat. Nasal congestion. Sinus drainage.very painful swallowing.  Mild wheezing.  Patient  denies any fever, body aches,chills, rash, chest pain, shortness of breath, nausea, vomiting, or diarrhea.  Denies dizziness, lightheadedness, pre syncopal or syncopal episodes.    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 Marry Guan    Past Surgical History:  Procedure Laterality Date   ELBOW SURGERY Right [place] LITHOTRIPSY Left 01/22/2020   Procedure: EXTRACORPOREAL SHOCK WAVE LITHOTRIPSY (ESWL);  Surgeon: Abbie Sons, MD;  Location: [facility] OR[facility];  Service: Urology;  Laterality: Left;   EYE SURGERY  1995   KNEE ARTHROSCOPY WITH LATERAL MENISECTOMY Right 03/04/2020   Procedure: KNEE ARTHROSCOPY WITH PARTIAL LATERAL MENISECTOMY;  Surgeon: Hessie Knows, MD;  Location: [facility];  Service: Orthopedics;  Laterality: Right;   LEG SURGERY  1985   SHOULDER SURGERY     SUBACROMIAL DECOMPRESSION  2000   Right shoulder, [place]   TONSILLECTOMY  2001    Family History  Problem Relation Age of Onset   Diabetes  Mother    Coronary artery disease Mother    Hyperlipidemia Mother    Hypertension Mother    Parkinson's disease Mother    Heart disease Maternal Grandfather    Stroke Father     Social History   Socioeconomic History   Marital status: Single    Spouse name: Not on file   Number of children: Not on file   Years of education: Not on file   Highest education level: Not on file  Occupational History   Not on file  Tobacco Use   Smoking status: Never   Smokeless tobacco: Never  Vaping Use   Vaping Use: Never used  Substance and Sexual Activity   Alcohol use: No   Drug use: No   Sexual activity: Yes    Birth control/protection: Injection  Other Topics Concern   Not on file  Social History Narrative   Not on file   Social Determinants of Health   Financial Resource Strain: Low Risk    Difficulty of Paying Living Expenses: Not hard at all  Food Insecurity: No Food Insecurity   Worried About Charity fundraiser in the Last Year: Never true   [place] in the Last Year: Never true  Transportation Needs: No Transportation Needs   Lack of Transportation (Medical): No   Lack of Transportation (Non-Medical): No  Physical Activity: Unknown   Days of Exercise per Week: 2 days   Minutes of Exercise per Session: Not  on file  Stress: No Stress Concern Present   Feeling of Stress : Not at all  Social Connections: Unknown   Frequency of Communication with Friends and Family: Not on file   Frequency of Social Gatherings with Friends and Family: More than three times a week   Attends Religious Services: Not on Electrical engineer or Organizations: Not on file   Attends Archivist Meetings: Not on file   Marital Status: Never married  Intimate Partner Violence: Not At Risk   Fear of Current or Ex-Partner: No   Emotionally Abused: No   Physically Abused: No   Sexually Abused: No    Outpatient Medications Prior to Visit  Medication Sig Dispense Refill    Acetaminophen-Caffeine 500-65 MG TABS Take 1 tablet by mouth in the morning, at noon, and at bedtime.      albuterol (VENTOLIN HFA) 108 (90 Base) MCG/ACT inhaler Inhale 2 puffs into the lungs every 6 (six) hours as needed for wheezing or shortness of breath.     amLODipine (NORVASC) 10 MG tablet Take 1 tablet (10 mg total) by mouth daily. 90 tablet 1   Ascorbic Acid (VITAMIN C) 500 MG CAPS Take 1 tablet by mouth.     Atogepant (QULIPTA) 60 MG TABS      baclofen (LIORESAL) 10 MG tablet Take 10 mg by mouth 2 (two) times daily as needed.     carbamazepine (CARBATROL) 300 MG 12 hr capsule Take 300 mg by mouth in the morning, at noon, and at bedtime.   1   cetirizine (ZYRTEC) 10 MG tablet Take 10 mg by mouth daily.     Cholecalciferol (VITAMIN D3) 25 MCG (1000 UT) CAPS Take 1 capsule by mouth.     cyclobenzaprine (FLEXERIL) 5 MG tablet Take 1 tablet (5 mg total) by mouth 3 (three) times daily as needed for muscle spasms. 30 tablet 1   diphenhydrAMINE HCl, Sleep, (ZZZQUIL PO) Take by mouth at bedtime.     escitalopram (LEXAPRO) 10 MG tablet TAKE 1 TABLET BY MOUTH EVERY DAY 90 tablet 0   fluticasone furoate-vilanterol (BREO ELLIPTA) 100-25 MCG/INH AEPB Inhale into the lungs.     Lactulose 20 GM/30ML SOLN 30 ml every 4 hours until constipation is relieved 236 mL 3   MAGNESIUM PO Take 500 mg by mouth.     medroxyPROGESTERone (DEPO-PROVERA) 150 MG/ML injection Inject 1 mL (150 mg total) into the muscle every 3 (three) months. 1 mL 3   metoprolol succinate (TOPROL-XL) 100 MG 24 hr tablet TAKE 1 TABLET BY MOUTH EVERY DAY WITH OR IMMEDIATELY FOLLOWING A MEAL 90 tablet 0   Multiple Vitamins-Minerals (CENTRUM WOMEN PO) Take by mouth daily in the afternoon.     nabumetone (RELAFEN) 500 MG tablet Take 1 tablet (500 mg total) by mouth 2 (two) times daily. 60 tablet 0   No facility-administered medications prior to visit.    Allergies  Allergen Reactions   Zonegran [Zonisamide] Rash    Review of Systems   Constitutional:  Positive for fatigue. Negative for activity change, appetite change, chills, diaphoresis, fever and unexpected weight change.  HENT:  Positive for congestion, postnasal drip, rhinorrhea, sinus pain and sore throat. Negative for dental problem, drooling, ear discharge, ear pain, facial swelling, hearing loss, mouth sores, nosebleeds, sinus pressure, sneezing, tinnitus, trouble swallowing (painful swallowing) and voice change.   Respiratory:  Positive for wheezing. Negative for apnea, cough, choking, chest tightness, shortness of breath and stridor.   Cardiovascular:  Negative.   Gastrointestinal: Negative.   Genitourinary: Negative.   Musculoskeletal: Negative.   Neurological: Negative.   Psychiatric/Behavioral: Negative.        Objective:    Physical Exam Vitals reviewed.  Constitutional:      General: She is not in acute distress.    Appearance: She is well-developed. She is not diaphoretic.     Interventions: She is not intubated. HENT:     Head: Normocephalic and atraumatic.     Right Ear: External ear normal.     Left Ear: External ear normal.     Nose: Nose normal.     Mouth/Throat:     Mouth: Mucous membranes are moist.     Pharynx: Posterior oropharyngeal erythema present. No oropharyngeal exudate.  Eyes:     General: Lids are normal. No scleral icterus.       Right eye: No discharge.        Left eye: No discharge.     Conjunctiva/sclera: Conjunctivae normal.     Right eye: Right conjunctiva is not injected. No exudate or hemorrhage.    Left eye: Left conjunctiva is not injected. No exudate or hemorrhage.    Pupils: Pupils are equal, round, and reactive to light.  Neck:     Thyroid: No thyroid mass or thyromegaly.     Vascular: Normal carotid pulses. No carotid bruit, hepatojugular reflux or JVD.     Trachea: Trachea and phonation normal. No tracheal tenderness or tracheal deviation.     Meningeal: Brudzinski's sign and Kernig's sign absent.   Cardiovascular:     Rate and Rhythm: Normal rate and regular rhythm.     Pulses: Normal pulses.          Radial pulses are 2+ on the right side and 2+ on the left side.       Dorsalis pedis pulses are 2+ on the right side and 2+ on the left side.       Posterior tibial pulses are 2+ on the right side and 2+ on the left side.     Heart sounds: Normal heart sounds, S1 normal and S2 normal. Heart sounds not distant. No murmur heard.   No friction rub. No gallop.  Pulmonary:     Effort: Pulmonary effort is normal. No tachypnea, bradypnea, accessory muscle usage or respiratory distress. She is not intubated.     Breath sounds: Normal breath sounds. No stridor. No wheezing, rhonchi or rales.  Chest:     Chest wall: No tenderness.  Abdominal:     General: Bowel sounds are normal. There is no distension or abdominal bruit.     Palpations: Abdomen is soft. There is no shifting dullness, fluid wave, hepatomegaly, splenomegaly, mass or pulsatile mass.     Tenderness: There is no abdominal tenderness. There is no guarding or rebound.     Hernia: No hernia is present.  Musculoskeletal:        General: No tenderness or deformity. Normal range of motion.     Cervical back: Full passive range of motion without pain, normal range of motion and neck supple. No edema, erythema or rigidity. No spinous process tenderness or muscular tenderness. Normal range of motion.  Lymphadenopathy:     Head:     Right side of head: No submental, submandibular, tonsillar, preauricular, posterior auricular or occipital adenopathy.     Left side of head: No submental, submandibular, tonsillar, preauricular, posterior auricular or occipital adenopathy.     Cervical: No cervical adenopathy.  Right cervical: No superficial, deep or posterior cervical adenopathy.    Left cervical: No superficial, deep or posterior cervical adenopathy.     Upper Body:     Right upper body: No supraclavicular or pectoral adenopathy.      Left upper body: No supraclavicular or pectoral adenopathy.  Skin:    General: Skin is warm and dry.     Coloration: Skin is not pale.     Findings: No abrasion, bruising, burn, ecchymosis, erythema, lesion, petechiae or rash.     Nails: There is no clubbing.  Neurological:     Mental Status: She is alert and oriented to person, place, and time.     GCS: GCS eye subscore is 4. GCS verbal subscore is 5. GCS motor subscore is 6.     Cranial Nerves: No cranial nerve deficit.     Sensory: No sensory deficit.     Motor: No tremor, atrophy, abnormal muscle tone or seizure activity.     Coordination: Coordination normal.     Gait: Gait normal.     Deep Tendon Reflexes: Reflexes are normal and symmetric. Reflexes normal. Babinski sign absent on the right side. Babinski sign absent on the left side.     Reflex Scores:      Tricep reflexes are 2+ on the right side and 2+ on the left side.      Bicep reflexes are 2+ on the right side and 2+ on the left side.      Brachioradialis reflexes are 2+ on the right side and 2+ on the left side.      Patellar reflexes are 2+ on the right side and 2+ on the left side.      Achilles reflexes are 2+ on the right side and 2+ on the left side. Psychiatric:        Speech: Speech normal.        Behavior: Behavior normal.        Thought Content: Thought content normal.        Judgment: Judgment normal.    BP (!) 142/96    Pulse (!) 114    Temp (!) 96.6 F (35.9 C)    Ht 5' 2.99" (1.6 m)    Wt 204 lb 6.4 oz (92.7 kg)    SpO2 97%    BMI 36.22 kg/m  Wt Readings from Last 3 Encounters:  11/29/21 204 lb 6.4 oz (92.7 kg)  11/17/21 205 lb 11.2 oz (93.3 kg)  11/09/21 203 lb (92.1 kg)    Health Maintenance Due  Topic Date Due   COVID-19 Vaccine (5 - Booster for Pfizer series) 11/13/2021    There are no preventive care reminders to display for this patient.   Lab Results  Component Value Date   TSH 1.770 11/11/2020   Lab Results  Component Value Date    WBC 6.6 11/29/2021   HGB 13.9 11/29/2021   HCT 41.6 11/29/2021   MCV 93.3 11/29/2021   PLT 293.0 11/29/2021   Lab Results  Component Value Date   NA 142 11/17/2021   K 4.3 11/17/2021   CO2 18 (L) 11/17/2021   GLUCOSE 99 11/17/2021   BUN 12 11/17/2021   CREATININE 0.80 11/17/2021   BILITOT <0.2 11/17/2021   ALKPHOS 93 11/17/2021   AST 21 11/17/2021   ALT 26 11/17/2021   PROT 7.0 11/17/2021   ALBUMIN 4.9 (H) 11/17/2021   CALCIUM 9.5 11/17/2021   ANIONGAP 7 01/14/2020   EGFR 94 11/17/2021  GFR 93.68 05/23/2021   Lab Results  Component Value Date   CHOL 181 05/15/2019   Lab Results  Component Value Date   HDL 57.70 05/15/2019   Lab Results  Component Value Date   LDLCALC 103 (H) 05/15/2019   Lab Results  Component Value Date   TRIG 102.0 05/15/2019   Lab Results  Component Value Date   CHOLHDL 3 05/15/2019   Lab Results  Component Value Date   HGBA1C 5.3 11/17/2021       Assessment & Plan:   Problem List Items Addressed This Visit   None Visit Diagnoses     Strep pharyngitis    -  Primary   Relevant Medications   amoxicillin-clavulanate (AUGMENTIN) 875-125 MG tablet   Other Relevant Orders   POCT Influenza A/B (Completed)   POC COVID-19 (Completed)   POCT rapid strep A (Completed)   Acute cough       Relevant Orders   DG Chest 2 View (Completed)   CBC with Differential/Platelet (Completed)      No results found for this or any previous visit (from the past 16 hour(s)).   Strep test in office positive, Covid and influenza A and B negative.   Meds ordered this encounter  Medications   amoxicillin-clavulanate (AUGMENTIN) 875-125 MG tablet    Sig: Take 1 tablet by mouth 2 (two) times daily.    Dispense:  20 tablet    Refill:  0   Return in about 1 week (around 12/06/2021), or if symptoms worsen or fail to improve, for at any time for any worsening symptoms, Go to Emergency room/ urgent care if worse.  Strict return precautions discussed,  chest x ray today , lungs clear to auscultation and no adventitious sounds, will have her go for chest x ray and use albuterol she has at home PRN wheezing, she declined refill and declines steroid at this time.   Red Flags discussed. The patient was given clear instructions to go to ER or return to medical center if any red flags develop, symptoms do not improve, worsen or new problems develop. They verbalized understanding.   Marcille Buffy, FNP

## 2021-11-29 NOTE — Progress Notes (Signed)
Patient notified of strep positive in office. On Augmentin started 11/29/21

## 2021-11-29 NOTE — Patient Instructions (Signed)
Upper Respiratory Infection, Adult An upper respiratory infection (URI) affects the nose, throat, and upper airways that lead to the lungs. The most common type of URI is often called the common cold. URIs usually get better on their own, without medical treatment. What are the causes? A URI is caused by a germ (virus). You may catch these germs by: Breathing in droplets from an infected person's cough or sneeze. Touching something that has the germ on it (is contaminated) and then touching your mouth, nose, or eyes. What increases the risk? You are more likely to get a URI if: You are very young or very old. You have close contact with others, such as at work, school, or a health care facility. You smoke. You have long-term (chronic) heart or lung disease. You have a weakened disease-fighting system (immune system). You have nasal allergies or asthma. You have a lot of stress. You have poor nutrition. What are the signs or symptoms? Runny or stuffy (congested) nose. Cough. Sneezing. Sore throat. Headache. Feeling tired (fatigue). Fever. Not wanting to eat as much as usual. Pain in your forehead, behind your eyes, and over your cheekbones (sinus pain). Muscle aches. Redness or irritation of the eyes. Pressure in the ears or face. How is this treated? URIs usually get better on their own within 7-10 days. Medicines cannot cure URIs, but your doctor may recommend certain medicines to help relieve symptoms, such as: Over-the-counter cold medicines. Medicines to reduce coughing (cough suppressants). Coughing is a type of defense against infection that helps to clear the nose, throat, windpipe, and lungs (respiratory system). Take these medicines only as told by your doctor. Medicines to lower your fever. Follow these instructions at home: Activity Rest as needed. If you have a fever, stay home from work or school until your fever is gone, or until your doctor says you may return to  work or school. You should stay home until you cannot spread the infection anymore (you are not contagious). Your doctor may have you wear a face mask so you have less risk of spreading the infection. Relieving symptoms Rinse your mouth often with salt water. To make salt water, dissolve -1 tsp (3-6 g) of salt in 1 cup (237 mL) of warm water. Use a cool-mist humidifier to add moisture to the air. This can help you breathe more easily. Eating and drinking  Drink enough fluid to keep your pee (urine) pale yellow. Eat soups and other clear broths. General instructions  Take over-the-counter and prescription medicines only as told by your doctor. Do not smoke or use any products that contain nicotine or tobacco. If you need help quitting, ask your doctor. Avoid being where people are smoking (avoid secondhand smoke). Stay up to date on all your shots (immunizations), and get the flu shot every year. Keep all follow-up visits. How to prevent the spread of infection to others  Wash your hands with soap and water for at least 20 seconds. If you cannot use soap and water, use hand sanitizer. Avoid touching your mouth, face, eyes, or nose. Cough or sneeze into a tissue or your sleeve or elbow. Do not cough or sneeze into your hand or into the air. Contact a doctor if: You are getting worse, not better. You have any of these: A fever or chills. Brown or red mucus in your nose. Yellow or brown fluid (discharge)coming from your nose. Pain in your face, especially when you bend forward. Swollen neck glands. Pain when you swallow.  White areas in the back of your throat. Get help right away if: You have shortness of breath that gets worse. You have very bad or constant: Headache. Ear pain. Pain in your forehead, behind your eyes, and over your cheekbones (sinus pain). Chest pain. You have long-lasting (chronic) lung disease along with any of these: Making high-pitched whistling sounds when  you breathe, most often when you breathe out (wheezing). Long-lasting cough (more than 14 days). Coughing up blood. A change in your usual mucus. You have a stiff neck. You have changes in your: Vision. Hearing. Thinking. Mood. These symptoms may be an emergency. Get help right away. Call 911. Do not wait to see if the symptoms will go away. Do not drive yourself to the hospital. Summary An upper respiratory infection (URI) is caused by a germ (virus). The most common type of URI is often called the common cold. URIs usually get better within 7-10 days. Take over-the-counter and prescription medicines only as told by your doctor. This information is not intended to replace advice given to you by your health care provider. Make sure you discuss any questions you have with your health care provider. Document Revised: 06/29/2021 Document Reviewed: 06/29/2021 Elsevier Patient Education  [place]. Strep Throat, Adult Strep throat is an infection of the throat. It is caused by germs (bacteria). Strep throat is common during the cold months of the year. It mostly affects children who are 70-53 years old. However, people of all ages can get it at any time of the year. This infection spreads from person to person through coughing, sneezing, or having close contact. What are the causes? This condition is caused by the Streptococcus pyogenes germ. What increases the risk? You care for young children. Children are more likely to get strep throat and may spread it to others. You go to crowded places. Germs can spread easily in such places. You kiss or touch someone who has strep throat. What are the signs or symptoms? Fever or chills. Redness, swelling, or pain in the tonsils or throat. Pain or trouble when swallowing. White or yellow spots on the tonsils or throat. Tender glands in the neck and under the jaw. Bad breath. Red rash all over the body. This is rare. How is this  treated? Medicines that kill germs (antibiotics). Medicines that treat pain or fever. These include: Ibuprofen or acetaminophen. Aspirin, only for people who are over the age of 3. Cough drops. Throat sprays. Follow these instructions at home: Medicines  Take over-the-counter and prescription medicines only as told by your doctor. Take your antibiotic medicine as told by your doctor. Do not stop taking the antibiotic even if you start to feel better. Eating and drinking  If you have trouble swallowing, eat soft foods until your throat feels better. Drink enough fluid to keep your pee (urine) pale yellow. To help with pain, you may have: Warm fluids, such as soup and tea. Cold fluids, such as frozen desserts or popsicles. General instructions Rinse your mouth (gargle) with a salt-water mixture 3-4 times a day or as needed. To make a salt-water mixture, dissolve -1 tsp (3-6 g) of salt in 1 cup (237 mL) of warm water. Rest as much as you can. Stay home from work or school until you have been taking antibiotics for 24 hours. Do not smoke or use any products that contain nicotine or tobacco. If you need help quitting, ask your doctor. Keep all follow-up visits. How is this prevented?  Do not share food, drinking cups, or personal items. They can cause the germs to spread. Wash your hands well with soap and water. Make sure that all people in your house wash their hands well. Have family members tested if they have a fever or a sore throat. They may need an antibiotic if they have strep throat. Contact a doctor if: You have swelling in your neck that keeps getting bigger. You get a rash, cough, or earache. You cough up a thick fluid that is green, yellow-brown, or bloody. You have pain that does not get better with medicine. Your symptoms get worse instead of getting better. You have a fever. Get help right away if: You vomit. You have a very bad headache. Your neck hurts or feels  stiff. You have chest pain or are short of breath. You have drooling, very bad throat pain, or changes in your voice. Your neck is swollen, or the skin gets red and tender. Your mouth is dry, or you are peeing less than normal. You keep feeling more tired or have trouble waking up. Your joints are red or painful. These symptoms may be an emergency. Do not wait to see if the symptoms will go away. Get help right away. Call your local emergency services (911 in the [place] Summary Strep throat is an infection of the throat. It is caused by germs (bacteria). This infection can spread from person to person through coughing, sneezing, or having close contact. Take your medicines, including antibiotics, as told by your doctor. Do not stop taking the antibiotic even if you start to feel better. To prevent the spread of germs, wash your hands well with soap and water. Have others do the same. Do not share food, drinking cups, or personal items. Get help right away if you have a bad headache, chest pain, shortness of breath, a stiff or painful neck, or you vomit. This information is not intended to replace advice given to you by your health care provider. Make sure you discuss any questions you have with your health care provider. Document Revised: 03/22/2021 Document Reviewed: 03/22/2021 Elsevier Patient Education  2022 [employer].

## 2021-11-30 LAB — CBC WITH DIFFERENTIAL/PLATELET
Basophils Absolute: 0 10*3/uL (ref 0.0–0.1)
Basophils Relative: 0.6 % (ref 0.0–3.0)
Eosinophils Absolute: 0.2 10*3/uL (ref 0.0–0.7)
Eosinophils Relative: 3.2 % (ref 0.0–5.0)
HCT: 41.6 % (ref 36.0–46.0)
Hemoglobin: 13.9 g/dL (ref 12.0–15.0)
Lymphocytes Relative: 17.1 % (ref 12.0–46.0)
Lymphs Abs: 1.1 10*3/uL (ref 0.7–4.0)
MCHC: 33.4 g/dL (ref 30.0–36.0)
MCV: 93.3 fl (ref 78.0–100.0)
Monocytes Absolute: 0.5 10*3/uL (ref 0.1–1.0)
Monocytes Relative: 8.2 % (ref 3.0–12.0)
Neutro Abs: 4.7 10*3/uL (ref 1.4–7.7)
Neutrophils Relative %: 70.9 % (ref 43.0–77.0)
Platelets: 293 10*3/uL (ref 150.0–400.0)
RBC: 4.46 Mil/uL (ref 3.87–5.11)
RDW: 13.2 % (ref 11.5–15.5)
WBC: 6.6 10*3/uL (ref 4.0–10.5)

## 2021-11-30 NOTE — Progress Notes (Signed)
Cbc within normal limits.

## 2021-11-30 NOTE — Progress Notes (Signed)
No active pulmonary disease on chest x ray is within normal limits.  Follow treatment plan from office and return to office or medical facility at ANYTIME if any symptoms persist, change, or worsen or you have any further concerns or questions.

## 2021-12-01 ENCOUNTER — Ambulatory Visit: Payer: Medicare Other | Admitting: Physical Therapy

## 2021-12-07 ENCOUNTER — Encounter: Payer: Medicare Other | Admitting: Physical Therapy

## 2021-12-07 DIAGNOSIS — M542 Cervicalgia: Secondary | ICD-10-CM | POA: Diagnosis not present

## 2021-12-07 DIAGNOSIS — M791 Myalgia, unspecified site: Secondary | ICD-10-CM | POA: Diagnosis not present

## 2021-12-07 DIAGNOSIS — G518 Other disorders of facial nerve: Secondary | ICD-10-CM | POA: Diagnosis not present

## 2021-12-07 DIAGNOSIS — G43719 Chronic migraine without aura, intractable, without status migrainosus: Secondary | ICD-10-CM | POA: Diagnosis not present

## 2021-12-09 ENCOUNTER — Other Ambulatory Visit: Payer: Self-pay | Admitting: Obstetrics and Gynecology

## 2021-12-13 ENCOUNTER — Encounter: Payer: Medicare Other | Admitting: Physical Therapy

## 2021-12-13 ENCOUNTER — Encounter: Payer: Self-pay | Admitting: Adult Health

## 2021-12-13 ENCOUNTER — Ambulatory Visit (INDEPENDENT_AMBULATORY_CARE_PROVIDER_SITE_OTHER): Payer: Medicare Other | Admitting: Adult Health

## 2021-12-13 ENCOUNTER — Other Ambulatory Visit: Payer: Self-pay

## 2021-12-13 VITALS — BP 126/84 | HR 85 | Ht 62.99 in | Wt 205.0 lb

## 2021-12-13 DIAGNOSIS — T3695XA Adverse effect of unspecified systemic antibiotic, initial encounter: Secondary | ICD-10-CM

## 2021-12-13 DIAGNOSIS — B379 Candidiasis, unspecified: Secondary | ICD-10-CM | POA: Insufficient documentation

## 2021-12-13 DIAGNOSIS — K219 Gastro-esophageal reflux disease without esophagitis: Secondary | ICD-10-CM | POA: Diagnosis not present

## 2021-12-13 DIAGNOSIS — H6983 Other specified disorders of Eustachian tube, bilateral: Secondary | ICD-10-CM | POA: Diagnosis not present

## 2021-12-13 DIAGNOSIS — J029 Acute pharyngitis, unspecified: Secondary | ICD-10-CM | POA: Diagnosis not present

## 2021-12-13 MED ORDER — FLUTICASONE PROPIONATE 50 MCG/ACT NA SUSP: 2.0000 | Freq: Every day | NASAL | 6 refills | Status: AC

## 2021-12-13 MED ORDER — FLUCONAZOLE 150 MG PO TABS: 150.0000 mg | ORAL_TABLET | ORAL | 0 refills | Status: DC

## 2021-12-13 MED ORDER — OMEPRAZOLE 20 MG PO CPDR: 20.0000 mg | DELAYED_RELEASE_CAPSULE | Freq: Two times a day (BID) | ORAL | 3 refills | Status: DC

## 2021-12-13 NOTE — Patient Instructions (Addendum)
FluconazoOmeprazole Tablets What is this medication? OMEPRAZOLE (oh ME pray zol) is used to treat heartburn, stomach ulcers, reflux disease, or other conditions that cause too much stomach acid. It works by reducing the amount of acid in the stomach. It belongs to a group of medications called PPIs. This medicine may be used for other purposes; ask your health care provider or pharmacist if you have questions. COMMON BRAND NAME(S): Prilosec OTC What should I tell my care team before I take this medication? They need to know if you have any of these conditions: Chest pain Have had heartburn for over 3 months Have heartburn with dizziness, lightheadedness or sweating Liver disease Low levels of calcium, magnesium, or potassium in the blood Lupus Stomach pain Trouble swallowing Unexplained weight loss Vomiting with blood Wheezing An unusual or allergic reaction to omeprazole, other medications, foods, dyes, or preservatives Pregnant or trying to get pregnant Breast-feeding How should I use this medication? Take this medication by mouth with a glass of water. Follow the directions on the product label. Do not cut, crush or chew this medication. Swallow the tablets whole. Take this medication on an empty stomach, at least 30 minutes before breakfast. Take your medication at regular intervals. Do not take it more often than directed. Talk to your care team about the use of this medication in children. Special care may be needed. Overdosage: If you think you have taken too much of this medicine contact a poison control center or emergency room at once. NOTE: This medicine is only for you. Do not share this medicine with others. What if I miss a dose? If you miss a dose, take it as soon as you can. If it is almost time for your next dose, take only that dose. Do not take double or extra doses. What may interact with this medication? Do not take this medication with any of the  following: Atazanavir Clopidogrel Nelfinavir Rilpivirine This medication may also interact with the following: Antifungals like itraconazole, ketoconazole, and voriconazole Certain antivirals for HIV or hepatitis Certain medications that treat or prevent blood clots like warfarin Cilostazol Citalopram Cyclosporine Dasatinib Digoxin Disulfiram Diuretics Erlotinib Iron supplements Medications for anxiety, panic, and sleep like diazepam Medications for seizures like carbamazepine, phenobarbital, phenytoin Methotrexate Mycophenolate mofetil Nilotinib Rifampin St. John's wort Tacrolimus Vitamin B12 This list may not describe all possible interactions. Give your health care provider a list of all the medicines, herbs, non-prescription drugs, or dietary supplements you use. Also tell them if you smoke, drink alcohol, or use illegal drugs. Some items may interact with your medicine. What should I watch for while using this medication? It can take several days before your stomach pain gets better. Check with your care team if your condition does not start to get better, or if it gets worse. Do not treat diarrhea with over the counter products. Contact your care team if you have diarrhea that lasts more than 2 days or if it is severe and watery. You may need blood work done while you are taking this medication. Using this medication for a long time may weaken your bones. The risk of bone fractures may be increased. Talk to your care team about your bone health. Using this medication for a long time may cause growths (polyps) in the stomach. They usually don't cause any symptoms. They are usually not cancerous. Contact your care team if you notice pain or tenderness when you press your stomach, have nausea, or see bloody or black, tar-like  stools. This medication may cause a decrease in vitamin B12. You should make sure that you get enough vitamin B12 while you are taking this medication.  Discuss the foods you eat and the vitamins you take with your care team. What side effects may I notice from receiving this medication? Side effects that you should report to your care team as soon as possible: Allergic reactions--skin rash, itching, hives, swelling of the face, lips, tongue, or throat Kidney injury--decrease in the amount of urine, swelling of the ankles, hands, or feet Low magnesium level--muscle pain or cramps, unusual weakness, fatigue, fast or irregular heartbeat, tremors Low vitamin B12 level--pain, tingling, or numbness in the hands or feet, muscle weakness, dizziness, confusion, difficulty concentrating Rash on the cheeks or arms that gets worse in the sun Redness, blistering, peeling, or loosening of the skin, including inside the mouth Severe diarrhea, fever Unusual bleeding or bruising Side effects that usually do not require medical attention (report to your care team if they continue or are bothersome): Gas Headache Nausea Stomach pain Vomiting This list may not describe all possible side effects. Call your doctor for medical advice about side effects. You may report side effects to FDA at 1-800-FDA-1088. Where should I keep my medication? Keep out of the reach of children and pets. Store at room temperature between 20 and 25 degrees C (68 and 77 degrees F). Protect from light and moisture. Get rid of any unused medication after the expiration date. To get rid of medications that are no longer needed or expired: Take the medication to a medication take-back program. Check with your pharmacy or law enforcement to find a location. If you cannot return the medication, check the label or package insert to see if the medication should be thrown out in the garbage or flushed down the toilet. If you are not sure, ask your care team. If it is safe to put in the trash, empty the medication out of the container. Mix the medication with cat litter, dirt, coffee grounds, or  other unwanted substance. Seal the mixture in a bag or container. Put it in the trash. NOTE: This sheet is a summary. It may not cover all possible information. If you have questions about this medicine, talk to your doctor, pharmacist, or health care provider.  2022 [employer] Standard (2021-02-22 00:00:00) le Tablets What is this medication? FLUCONAZOLE (floo KON na zole) prevents and treats fungal or yeast infections. It belongs to a group of medications called antifungals. It will not prevent or treat colds, the flu, or infections caused by bacteria or viruses. This medicine may be used for other purposes; ask your health care provider or pharmacist if you have questions. COMMON BRAND NAME(S): Diflucan What should I tell my care team before I take this medication? They need to know if you have any of these conditions: Irregular heartbeat or rhythm Kidney disease Liver disease Low levels of potassium in the blood An unusual or allergic reaction to fluconazole, other azole antifungals, medications, foods, dyes, or preservatives Pregnant or trying to get pregnant Breast-feeding How should I use this medication? Take this medication by mouth. Follow the directions on the prescription label. Do not take your medication more often than directed. Talk to your care team about the use of this medication in children. Special care may be needed. This medication has been used in children as young as 63 months of age. Overdosage: If you think you have taken too much of this medicine contact a poison  control center or emergency room at once. NOTE: This medicine is only for you. Do not share this medicine with others. What if I miss a dose? If you miss a dose, take it as soon as you can. If it is almost time for your next dose, take only that dose. Do not take double or extra doses. What may interact with this medication? Do not take this medication with any of the following  medications: Flibanserin Lomitapide Lonafarnib Other medications that prolong the QT interval (cause an abnormal heart rhythm) Triazolam This medication may also interact with the following medications: Certain antibiotics like rifabutin, rifampin Certain antivirals for HIV or hepatitis Certain medications for blood pressure, heart disease, irregular heartbeat Certain medications for cholesterol like atorvastatin, lovastatin, and simvastatin Certain medications for depression, like amitriptyline, nortriptyline Certain medications for diabetes like glipizide or glyburide Certain medications for seizures like carbamazepine, phenytoin Certain medications that treat or prevent blood clots like warfarin Certain narcotic medications for pain like alfentanil, fentanyl, methadone Cyclophosphamide Cyclosporine Ibrutinib Lemborexant Midazolam NSAIDS, medications for pain and inflammation, like ibuprofen or naproxen Olaparib Sirolimus Steroid medications like prednisone Tacrolimus Theophylline Tofacitinib Tolvaptan Vinblastine Vincristine Vitamin A Voriconazole This list may not describe all possible interactions. Give your health care provider a list of all the medicines, herbs, non-prescription drugs, or dietary supplements you use. Also tell them if you smoke, drink alcohol, or use illegal drugs. Some items may interact with your medicine. What should I watch for while using this medication? Visit your care team for regular checkups. If you are taking this medication for a long time you may need blood work. Tell your care team if your symptoms do not improve. Some fungal infections need many weeks or months of treatment to cure. Alcohol can increase possible damage to your liver. Avoid alcoholic drinks. If you have a vaginal infection, do not have sex until you have finished your treatment. You can wear a sanitary napkin. Do not use tampons. Wear freshly washed cotton, not synthetic,  panties. What side effects may I notice from receiving this medication? Side effects that you should report to your care team as soon as possible: Allergic reactions--skin rash, itching, hives, swelling of the face, lips, tongue, or throat Heart rhythm changes--fast or irregular heartbeat, dizziness, feeling faint or lightheaded, chest pain, trouble breathing Liver injury--right upper belly pain, loss of appetite, nausea, light-colored stool, dark yellow or brown urine, yellowing skin or eyes, unusual weakness or fatigue Low adrenal gland function--nausea, vomiting, loss of appetite, unusual weakness or fatigue, dizziness Rash, fever, and swollen lymph nodes Redness, blistering, peeling, or loosening of the skin, including inside the mouth Seizures Side effects that usually do not require medical attention (report to your care team if they continue or are bothersome): Change in taste Diarrhea Dizziness Headache Nausea Stomach pain This list may not describe all possible side effects. Call your doctor for medical advice about side effects. You may report side effects to FDA at 1-800-FDA-1088. Where should I keep my medication? Keep out of the reach of children. Store at room temperature below 30 degrees C (86 degrees F). Throw away any medication after the expiration date. NOTE: This sheet is a summary. It may not cover all possible information. If you have questions about this medicine, talk to your doctor, pharmacist, or health care provider.  2022 [employer] (2021-08-16 00:00:00)

## 2021-12-13 NOTE — Progress Notes (Signed)
Acute Office Visit  Subjective:    Patient ID: Margaret Zuniga, female    DOB: 07-May-1981, 41 y.o.   MRN: [id number]  Chief Complaint  Patient presents with   Follow-up    HPI Patient is in today for follow up on strep pharyngitis. CBC was within normal limits 2 weeks ago.  Today she is feeling better.  Was treated with Augmentin for positive strep POCT in office.  Still has mild sore throat and mild cough. Throat feels better than did at last visit  Denies any wheezing.  She does have GERD for the past 3- 5 months she reports she has been taking omeprazole over the counter for the past few months and desires a prescription. She denies any bleeding or hemoptysis.  Has burning in throat in the morning when she awakes.  Feels much better today. Patient  denies any fever, body aches,chills, rash, chest pain, shortness of breath, nausea, vomiting, or diarrhea.  Denies dizziness, lightheadedness, pre syncopal or syncopal episodes.     Chest x ray on 11/30/21 was within normal limit.    Patient  denies any fever, body aches,chills, rash, chest pain, shortness of breath, nausea, vomiting, or diarrhea.  Denies dizziness, lightheadedness, pre syncopal or syncopal episodes.    Past Surgical History:  Procedure Laterality Date   ELBOW SURGERY Right 1995   EXTRACORPOREAL SHOCK WAVE LITHOTRIPSY Left 01/22/2020   Procedure: EXTRACORPOREAL SHOCK WAVE LITHOTRIPSY (ESWL);  Surgeon: Abbie Sons, MD;  Location: [facility];  Service: Urology;  Laterality: Left;   EYE SURGERY  1995   KNEE ARTHROSCOPY WITH LATERAL MENISECTOMY Right 03/04/2020   Procedure: KNEE ARTHROSCOPY WITH PARTIAL LATERAL MENISECTOMY;  Surgeon: Hessie Knows, MD;  Location: [facility];  Service: Orthopedics;  Laterality: Right;   LEG SURGERY  1985   SHOULDER SURGERY     SUBACROMIAL DECOMPRESSION  2000   Right shoulder, [place]   TONSILLECTOMY  2001    Family History  Problem Relation Age of Onset   Diabetes Mother     Coronary artery disease Mother    Hyperlipidemia Mother    Hypertension Mother    Parkinson's disease Mother    Heart disease Maternal Grandfather    Stroke Father     Social History   Socioeconomic History   Marital status: Single    Spouse name: Not on file   Number of children: Not on file   Years of education: Not on file   Highest education level: Not on file  Occupational History   Not on file  Tobacco Use   Smoking status: Never   Smokeless tobacco: Never  Vaping Use   Vaping Use: Never used  Substance and Sexual Activity   Alcohol use: No   Drug use: No   Sexual activity: Yes    Birth control/protection: Injection  Other Topics Concern   Not on file  Social History Narrative   Not on file   Social Determinants of Health   Financial Resource Strain: Low Risk    Difficulty of Paying Living Expenses: Not hard at all  Food Insecurity: No Food Insecurity   Worried About Charity fundraiser in the Last Year: Never true   [place] in the Last Year: Never true  Transportation Needs: No Transportation Needs   Lack of Transportation (Medical): No   Lack of Transportation (Non-Medical): No  Physical Activity: Unknown   Days of Exercise per Week: 2 days   Minutes of Exercise per Session:  Not on file  Stress: No Stress Concern Present   Feeling of Stress : Not at all  Social Connections: Unknown   Frequency of Communication with Friends and Family: Not on file   Frequency of Social Gatherings with Friends and Family: More than three times a week   Attends Religious Services: Not on Electrical engineer or Organizations: Not on file   Attends Archivist Meetings: Not on file   Marital Status: Never married  Intimate Partner Violence: Not At Risk   Fear of Current or Ex-Partner: No   Emotionally Abused: No   Physically Abused: No   Sexually Abused: No    Outpatient Medications Prior to Visit  Medication Sig Dispense Refill    Acetaminophen-Caffeine 500-65 MG TABS Take 1 tablet by mouth in the morning, at noon, and at bedtime.      albuterol (VENTOLIN HFA) 108 (90 Base) MCG/ACT inhaler Inhale 2 puffs into the lungs every 6 (six) hours as needed for wheezing or shortness of breath.     amLODipine (NORVASC) 10 MG tablet Take 1 tablet (10 mg total) by mouth daily. 90 tablet 1   Ascorbic Acid (VITAMIN C) 500 MG CAPS Take 1 tablet by mouth.     Atogepant (QULIPTA) 60 MG TABS      baclofen (LIORESAL) 10 MG tablet Take 10 mg by mouth 2 (two) times daily as needed.     carbamazepine (CARBATROL) 300 MG 12 hr capsule Take 300 mg by mouth in the morning, at noon, and at bedtime.   1   cetirizine (ZYRTEC) 10 MG tablet Take 10 mg by mouth daily.     Cholecalciferol (VITAMIN D3) 25 MCG (1000 UT) CAPS Take 1 capsule by mouth.     cyclobenzaprine (FLEXERIL) 5 MG tablet Take 1 tablet (5 mg total) by mouth 3 (three) times daily as needed for muscle spasms. 30 tablet 1   diphenhydrAMINE HCl, Sleep, (ZZZQUIL PO) Take by mouth at bedtime.     escitalopram (LEXAPRO) 10 MG tablet TAKE 1 TABLET BY MOUTH EVERY DAY 90 tablet 0   fluticasone furoate-vilanterol (BREO ELLIPTA) 100-25 MCG/INH AEPB Inhale into the lungs.     Lactulose 20 GM/30ML SOLN 30 ml every 4 hours until constipation is relieved 236 mL 3   MAGNESIUM PO Take 500 mg by mouth.     medroxyPROGESTERone (DEPO-PROVERA) 150 MG/ML injection INJECT 1 ML (150 MG TOTAL) INTO THE MUSCLE EVERY 3 (THREE) MONTHS. 1 mL 3   metoprolol succinate (TOPROL-XL) 100 MG 24 hr tablet TAKE 1 TABLET BY MOUTH EVERY DAY WITH OR IMMEDIATELY FOLLOWING A MEAL 90 tablet 0   Multiple Vitamins-Minerals (CENTRUM WOMEN PO) Take by mouth daily in the afternoon.     amoxicillin-clavulanate (AUGMENTIN) 875-125 MG tablet Take 1 tablet by mouth 2 (two) times daily. (Patient not taking: Reported on 12/13/2021) 20 tablet 0   No facility-administered medications prior to visit.    Allergies  Allergen Reactions    Zonegran [Zonisamide] Rash    Review of Systems  Constitutional: Negative.   HENT:  Positive for sore throat. Negative for congestion, dental problem, drooling, postnasal drip, rhinorrhea, sinus pressure and sinus pain.   Respiratory: Negative.    Cardiovascular: Negative.   Gastrointestinal: Negative.   Genitourinary: Negative.   Musculoskeletal: Negative.   Neurological: Negative.   Hematological: Negative.       Objective:    Physical Exam Vitals reviewed.  Constitutional:      General: She is  not in acute distress.    Appearance: Normal appearance. She is well-developed. She is not ill-appearing, toxic-appearing or diaphoretic.     Interventions: She is not intubated. HENT:     Head: Normocephalic and atraumatic.     Jaw: There is normal jaw occlusion.     Right Ear: Tympanic membrane, ear canal and external ear normal. There is no impacted cerumen.     Left Ear: Tympanic membrane, ear canal and external ear normal. There is no impacted cerumen.     Nose: Nose normal.     Mouth/Throat:     Lips: Pink.     Mouth: Mucous membranes are moist.     Pharynx: Oropharynx is clear. Posterior oropharyngeal erythema present. No oropharyngeal exudate.     Tonsils: No tonsillar exudate or tonsillar abscesses.  Eyes:     General: Lids are normal. No scleral icterus.       Right eye: No discharge.        Left eye: No discharge.     Conjunctiva/sclera: Conjunctivae normal.     Right eye: Right conjunctiva is not injected. No exudate or hemorrhage.    Left eye: Left conjunctiva is not injected. No exudate or hemorrhage.    Pupils: Pupils are equal, round, and reactive to light.  Neck:     Thyroid: No thyroid mass or thyromegaly.     Vascular: Normal carotid pulses. No carotid bruit, hepatojugular reflux or JVD.     Trachea: Trachea and phonation normal. No tracheal tenderness or tracheal deviation.     Meningeal: Brudzinski's sign and Kernig's sign absent.  Cardiovascular:     Rate  and Rhythm: Normal rate and regular rhythm.     Pulses: Normal pulses.          Radial pulses are 2+ on the right side and 2+ on the left side.       Dorsalis pedis pulses are 2+ on the right side and 2+ on the left side.       Posterior tibial pulses are 2+ on the right side and 2+ on the left side.     Heart sounds: Normal heart sounds, S1 normal and S2 normal. Heart sounds not distant. No murmur heard.   No friction rub. No gallop.  Pulmonary:     Effort: Pulmonary effort is normal. No tachypnea, bradypnea, accessory muscle usage or respiratory distress. She is not intubated.     Breath sounds: No stridor. No wheezing, rhonchi or rales.  Chest:     Chest wall: No tenderness.  Abdominal:     General: Bowel sounds are normal. There is no distension or abdominal bruit.     Palpations: Abdomen is soft. There is no shifting dullness, fluid wave, hepatomegaly, splenomegaly, mass or pulsatile mass.     Tenderness: There is no abdominal tenderness. There is no right CVA tenderness, left CVA tenderness, guarding or rebound.     Hernia: No hernia is present.  Musculoskeletal:        General: No swelling, tenderness, deformity or signs of injury. Normal range of motion.     Cervical back: Full passive range of motion without pain, normal range of motion and neck supple. No edema, erythema, rigidity or tenderness. No spinous process tenderness or muscular tenderness. Normal range of motion.     Right lower leg: No edema.     Left lower leg: No edema.  Lymphadenopathy:     Head:     Right side of head: No submental,  submandibular, tonsillar, preauricular, posterior auricular or occipital adenopathy.     Left side of head: No submental, submandibular, tonsillar, preauricular, posterior auricular or occipital adenopathy.     Cervical: No cervical adenopathy.     Right cervical: No superficial, deep or posterior cervical adenopathy.    Left cervical: No superficial, deep or posterior cervical  adenopathy.     Upper Body:     Right upper body: No supraclavicular or pectoral adenopathy.     Left upper body: No supraclavicular or pectoral adenopathy.  Skin:    General: Skin is warm and dry.     Coloration: Skin is not jaundiced or pale.     Findings: No abrasion, bruising, burn, ecchymosis, erythema, lesion, petechiae or rash.     Nails: There is no clubbing.  Neurological:     Mental Status: She is alert and oriented to person, place, and time.     GCS: GCS eye subscore is 4. GCS verbal subscore is 5. GCS motor subscore is 6.     Cranial Nerves: No cranial nerve deficit.     Sensory: No sensory deficit.     Motor: No weakness, tremor, atrophy, abnormal muscle tone or seizure activity.     Coordination: Coordination normal.     Gait: Gait normal.     Deep Tendon Reflexes: Reflexes are normal and symmetric. Reflexes normal. Babinski sign absent on the right side. Babinski sign absent on the left side.     Reflex Scores:      Tricep reflexes are 2+ on the right side and 2+ on the left side.      Bicep reflexes are 2+ on the right side and 2+ on the left side.      Brachioradialis reflexes are 2+ on the right side and 2+ on the left side.      Patellar reflexes are 2+ on the right side and 2+ on the left side.      Achilles reflexes are 2+ on the right side and 2+ on the left side. Psychiatric:        Speech: Speech normal.        Behavior: Behavior normal.        Thought Content: Thought content normal.        Judgment: Judgment normal.    BP 126/84    Pulse 85    Ht 5' 2.99" (1.6 m)    Wt 205 lb (93 kg)    SpO2 95%    BMI 36.32 kg/m  Wt Readings from Last 3 Encounters:  12/13/21 205 lb (93 kg)  11/29/21 204 lb 6.4 oz (92.7 kg)  11/17/21 205 lb 11.2 oz (93.3 kg)    Health Maintenance Due  Topic Date Due   COVID-19 Vaccine (5 - Booster for Pfizer series) 11/13/2021    There are no preventive care reminders to display for this patient.   Lab Results  Component  Value Date   TSH 1.770 11/11/2020   Lab Results  Component Value Date   WBC 6.6 11/29/2021   HGB 13.9 11/29/2021   HCT 41.6 11/29/2021   MCV 93.3 11/29/2021   PLT 293.0 11/29/2021   Lab Results  Component Value Date   NA 142 11/17/2021   K 4.3 11/17/2021   CO2 18 (L) 11/17/2021   GLUCOSE 99 11/17/2021   BUN 12 11/17/2021   CREATININE 0.80 11/17/2021   BILITOT <0.2 11/17/2021   ALKPHOS 93 11/17/2021   AST 21 11/17/2021   ALT 26 11/17/2021  PROT 7.0 11/17/2021   ALBUMIN 4.9 (H) 11/17/2021   CALCIUM 9.5 11/17/2021   ANIONGAP 7 01/14/2020   EGFR 94 11/17/2021   GFR 93.68 05/23/2021   Lab Results  Component Value Date   CHOL 181 05/15/2019   Lab Results  Component Value Date   HDL 57.70 05/15/2019   Lab Results  Component Value Date   LDLCALC 103 (H) 05/15/2019   Lab Results  Component Value Date   TRIG 102.0 05/15/2019   Lab Results  Component Value Date   CHOLHDL 3 05/15/2019   Lab Results  Component Value Date   HGBA1C 5.3 11/17/2021       Assessment & Plan:   Problem List Items Addressed This Visit       Digestive   Gastroesophageal reflux disease without esophagitis   Relevant Medications   omeprazole (PRILOSEC) 20 MG capsule     Nervous and Auditory   Eustachian tube dysfunction, bilateral   Relevant Medications   fluticasone (FLONASE) 50 MCG/ACT nasal spray     Other   Antibiotic-induced yeast infection - Primary   Relevant Medications   fluconazole (DIFLUCAN) 150 MG tablet   Other Relevant Orders   Urine Culture   Sore throat   Relevant Orders   Culture, Group A Strep   Urinalysis, Routine w reflex microscopic     Meds ordered this encounter  Medications   fluconazole (DIFLUCAN) 150 MG tablet    Sig: Take 1 tablet (150 mg total) by mouth as directed. Take one tablet by mouth on day 1. May repeat dose of one tablet by mouth on day four.    Dispense:  2 tablet    Refill:  0   fluticasone (FLONASE) 50 MCG/ACT nasal spray     Sig: Place 2 sprays into both nostrils daily.    Dispense:  16 g    Refill:  6   omeprazole (PRILOSEC) 20 MG capsule    Sig: Take 1 capsule (20 mg total) by mouth 2 (two) times daily before a meal.    Dispense:  30 capsule    Refill:  3  Red Flags discussed. The patient was given clear instructions to go to ER or return to medical center if any red flags develop, symptoms do not improve, worsen or new problems develop. They verbalized understanding. Advised patient call the office or your primary care doctor for an appointment if no improvement within 72 hours or if any symptoms change or worsen at any time  Advised ER or urgent Care if after hours or on weekend. Call 911 for emergency symptoms at any time.Patinet verbalized understanding of all instructions given/reviewed and treatment plan and has no further questions or concerns at this time.     Return in 1 month (on 01/13/2022), or if symptoms worsen or fail to improve, for at any time for any worsening symptoms. GERD follow up in one month.   Marcille Buffy, FNP

## 2021-12-14 ENCOUNTER — Ambulatory Visit: Payer: Medicare Other

## 2021-12-14 ENCOUNTER — Ambulatory Visit: Payer: Medicare Other | Admitting: Podiatry

## 2021-12-14 DIAGNOSIS — J029 Acute pharyngitis, unspecified: Secondary | ICD-10-CM | POA: Diagnosis not present

## 2021-12-14 DIAGNOSIS — T3695XA Adverse effect of unspecified systemic antibiotic, initial encounter: Secondary | ICD-10-CM | POA: Diagnosis not present

## 2021-12-14 DIAGNOSIS — B379 Candidiasis, unspecified: Secondary | ICD-10-CM | POA: Diagnosis not present

## 2021-12-14 NOTE — Addendum Note (Signed)
Addended by: Leeanne Rio on: 12/14/2021 02:20 PM   Modules accepted: Orders

## 2021-12-15 ENCOUNTER — Encounter: Payer: Medicare Other | Admitting: Physical Therapy

## 2021-12-15 LAB — CULTURE, GROUP A STREP
MICRO NUMBER:: 12820661
SPECIMEN QUALITY:: ADEQUATE

## 2021-12-15 LAB — URINALYSIS, ROUTINE W REFLEX MICROSCOPIC
Bilirubin, UA: NEGATIVE
Glucose, UA: NEGATIVE
Ketones, UA: NEGATIVE
Leukocytes,UA: NEGATIVE
Nitrite, UA: NEGATIVE
Protein,UA: NEGATIVE
RBC, UA: NEGATIVE
Specific Gravity, UA: 1.018 (ref 1.005–1.030)
Urobilinogen, Ur: 0.2 mg/dL (ref 0.2–1.0)
pH, UA: 7.5 (ref 5.0–7.5)

## 2021-12-15 NOTE — Progress Notes (Signed)
Strep negative. Urinalysis is negative.

## 2021-12-16 ENCOUNTER — Other Ambulatory Visit: Payer: Self-pay

## 2021-12-16 ENCOUNTER — Ambulatory Visit (INDEPENDENT_AMBULATORY_CARE_PROVIDER_SITE_OTHER): Payer: Medicare Other | Admitting: Obstetrics and Gynecology

## 2021-12-16 DIAGNOSIS — Z3042 Encounter for surveillance of injectable contraceptive: Secondary | ICD-10-CM | POA: Diagnosis not present

## 2021-12-16 MED ORDER — MEDROXYPROGESTERONE ACETATE 150 MG/ML IM SUSP
150.0000 mg | Freq: Once | INTRAMUSCULAR | Status: AC
  Administered 2021-12-16: 150 mg via INTRAMUSCULAR

## 2021-12-16 NOTE — Progress Notes (Signed)
Pt presents for routine depo injection, no adverse reactions, all questions answered.  Date last pap: 11/05/2018. Last Depo-Provera: 09/22/2021. Side Effects if any: NO. Serum HCG indicated? NA. Depo-Provera 150 mg IM given by: Peggye Pitt, CMA. Next appointment due March 24 - March 17 2022.

## 2021-12-18 ENCOUNTER — Other Ambulatory Visit: Payer: Self-pay | Admitting: Adult Health

## 2021-12-18 DIAGNOSIS — N3000 Acute cystitis without hematuria: Secondary | ICD-10-CM

## 2021-12-18 LAB — URINE CULTURE

## 2021-12-18 MED ORDER — CIPROFLOXACIN HCL 500 MG PO TABS: 500.0000 mg | ORAL_TABLET | Freq: Two times a day (BID) | ORAL | 0 refills | Status: DC

## 2021-12-18 NOTE — Progress Notes (Signed)
Bacteria in urine - urinary tract infection. Meds ordered this encounter Medications  ciprofloxacin (CIPRO) 500 MG tablet   Sig: Take 1 tablet (500 mg total) by mouth 2 (two) times daily.   Dispense:  10 tablet   Refill:  0

## 2021-12-18 NOTE — Progress Notes (Signed)
Meds ordered this encounter  Medications   ciprofloxacin (CIPRO) 500 MG tablet    Sig: Take 1 tablet (500 mg total) by mouth 2 (two) times daily.    Dispense:  10 tablet    Refill:  0

## 2021-12-19 ENCOUNTER — Encounter: Payer: Self-pay | Admitting: Obstetrics and Gynecology

## 2021-12-19 ENCOUNTER — Ambulatory Visit: Payer: Medicare Other | Admitting: Physical Therapy

## 2021-12-21 ENCOUNTER — Ambulatory Visit: Payer: Medicare Other | Attending: Podiatry | Admitting: Physical Therapy

## 2021-12-21 DIAGNOSIS — M25361 Other instability, right knee: Secondary | ICD-10-CM | POA: Diagnosis not present

## 2021-12-21 DIAGNOSIS — M79672 Pain in left foot: Secondary | ICD-10-CM | POA: Diagnosis not present

## 2021-12-21 DIAGNOSIS — R262 Difficulty in walking, not elsewhere classified: Secondary | ICD-10-CM | POA: Diagnosis not present

## 2021-12-21 DIAGNOSIS — M79671 Pain in right foot: Secondary | ICD-10-CM | POA: Insufficient documentation

## 2021-12-21 DIAGNOSIS — M25561 Pain in right knee: Secondary | ICD-10-CM | POA: Diagnosis not present

## 2021-12-21 NOTE — Therapy (Signed)
[facility] [address], [place], [address] Phone: [phone]   Fax:  [phone]  Physical Therapy Treatment  Patient Details  Name: Margaret Zuniga MRN: [id number] Date of Birth: [birth year] Referring Provider (PT): Dr. Sherryle Lis   Encounter Date: 12/21/2021   PT End of Session - 12/21/21 1745     Visit Number 6    Number of Visits 16    Date for PT Re-Evaluation 11/30/21    Authorization Type UHC Dual Compl/Caid    PT Start Time 1330    PT Stop Time 7253    PT Time Calculation (min) 45 min    Equipment Utilized During Treatment Gait belt   Swedish knee cage on RLE   Activity Tolerance Patient tolerated treatment well    Behavior During Therapy [facility] for tasks assessed/performed             Past Medical History:  Diagnosis Date   Bilateral nephrolithiasis 03/24/2018   Essential hypertension, benign 04/04/2016   Extrinsic asthma 08/15/2016   Headache due to trauma    chronic, takes, NSAIDs , imipramine, muscle relaxers (failed [facility])   History of kidney stones    Hypertension    Major depressive disorder, recurrent episode, moderate ([place]) 05/21/2013   Obesity (BMI 30.0-34.9) 04/11/2017   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 Marry Guan    Past Surgical History:  Procedure Laterality Date   ELBOW SURGERY Right [place] LITHOTRIPSY Left 01/22/2020   Procedure: EXTRACORPOREAL SHOCK WAVE LITHOTRIPSY (ESWL);  Surgeon: Abbie Sons, MD;  Location: [facility]S;  Service: Urology;  Laterality: Left;   EYE SURGERY  1995   KNEE ARTHROSCOPY WITH LATERAL MENISECTOMY Right 03/04/2020   Procedure: KNEE ARTHROSCOPY WITH PARTIAL LATERAL MENISECTOMY;  Surgeon: Hessie Knows, MD;  Location: [facility];  Service: Orthopedics;  Laterality:  Right;   LEG SURGERY  1985   SHOULDER SURGERY     SUBACROMIAL DECOMPRESSION  2000   Right shoulder, [place]   TONSILLECTOMY  2001    There were no vitals filed for this visit.   Subjective Assessment - 12/21/21 1333     Subjective Pt reports that she just received her swedish knee cage and that she is still experiencing increased heel pain.    Patient is accompained by: Family member    Pertinent History 41 y.o. female presents with the above complaint. History confirmed with patient.  She wears an [place] brace on this side.  This began to worsen over the last few weeks.  Most of it is on the back of the heel and on the bottom of the heel    Limitations Lifting;Walking;House hold activities;Other (comment)    How long can you sit comfortably? unlimited    How long can you stand comfortably? unlimited    How long can you walk comfortably? A couple of minutes    Patient Stated Goals Decrease pain; be able walk 80min; stair ambulation without pain    Currently in Pain? Yes    Pain Score 6     Pain Location Foot    Pain Orientation Left    Pain Descriptors / Indicators Dull    Pain Type Chronic pain    Pain Onset More than a month ago  GAIT TRAINING   7 min TM training  -min VC to increased LLE step length   10 M Overground Ambulation donning sweddish knee cage brace x 6 -Video record for further gait analysis  -mid foot to forefoot strike on LLE with increased supination  -Decreased knee flexion on RLE   10 M Overground Ambulation without sweddish knee cage brace x 8  -Increased heel strike on LLE and increased RLE hyperextension   Educated pt on how to don and doff knee brace correctly.  -Discussed back of knee being landmark for positioning of bar  -Discussed using below clothing with addition of knee sleeve to protect skin.                 PT Education - 12/21/21 1341     Education Details form and tehcnique for appropriate exercise     Person(s) Educated Patient    Methods Explanation;Tactile cues;Verbal cues;Demonstration    Comprehension Verbalized understanding;Returned demonstration              PT Short Term Goals - 12/21/21 1749       PT SHORT TERM GOAL #1   Title Patient will demonstrate understanding of home exercise plan.    Baseline 10/05/21: NT 11/30/21: Ongoing    Time 2    Period Weeks    Status On-going    Target Date 10/19/21               PT Long Term Goals - 12/21/21 1750       PT LONG TERM GOAL #1   Title Patient will have improved function and activity level as evidenced by an increase in FOTO score by 10 points or more.    Baseline 10/05/21: 49/67 12/21: 49/87    Time 8    Period Weeks    Status On-going    Target Date 02/18/22      PT LONG TERM GOAL #2   Title Patient will demonstrate improved ankle control and gait mechanics by maintaining a neutral right ankle during initial contact to avoid increased heel contact resulting in increased heel pain.    Baseline 10/05/21: NT 11/20/22: Increased hyperextension on RLE 12/21/21: Reduced heel contact on LLE with addition of swedish knee cage on RLE    Time 8    Period Weeks    Status Achieved    Target Date 02/18/22                   Plan - 12/21/21 1746     Clinical Impression Statement Pt presents for f/u for left heel pain resulting from gait deficits from past TBI. She has since received a swedish knee cage to prevent right knee hyperextension. She was able to trial swedish knee cage during session, and it resulted in decreased left heel pain while ambulating which is likely due to her changed gait mechanics where she is now performing a midfoot to forefoot on left foot while wearing swedish knee brace. Pt will continue to wear swedish knee cage brace, and report back before next session if she continues to have decreased left heel pain and her PT frequency will be decreased and plan of care will be shifted to a  maintenance program.    Personal Factors and Comorbidities Comorbidity 1;Time since onset of injury/illness/exacerbation;Past/Current Experience    Comorbidities TBI    Examination-Activity Limitations Locomotion Level    Examination-Participation Restrictions Other    Stability/Clinical Decision Making Evolving/Moderate complexity    Clinical Decision  Making Low    Rehab Potential Fair    Clinical Impairments Affecting Rehab Potential (-)TBI with resulting prolonged nature of abnormal gait, (-) right ankle contracture , (+) Motivation, strong family support    PT Frequency 2x / week    PT Duration 8 weeks    PT Treatment/Interventions Joint Manipulations;Taping;Splinting;Manual techniques;Orthotic Fit/Training;Neuromuscular re-education;Therapeutic activities;Therapeutic exercise;Balance training;Gait training;Moist Heat;Traction;Aquatic Therapy;Cryotherapy;Dry needling;Passive range of motion    PT Next Visit Plan Continue to perform gait training with swedish knee cage brace    PT Home Exercise Plan Access Code: [email or web address]  URL: [email or web address]  Date: 10/05/2021  Prepared by: Bradly Chris    Exercises  Long Sitting Calf Stretch with Strap - 1 x daily - 7 x weekly - 1 sets - 5 reps    Consulted and Agree with Plan of Care Patient             Patient will benefit from skilled therapeutic intervention in order to improve the following deficits and impairments:  Pain, Decreased range of motion, Abnormal gait, Difficulty walking, Hypomobility  Visit Diagnosis: Difficulty in walking, not elsewhere classified  Pain in left foot     Problem List Patient Active Problem List   Diagnosis Date Noted   Antibiotic-induced yeast infection 12/13/2021   Sore throat 12/13/2021   Gastroesophageal reflux disease without esophagitis 12/13/2021   Sub-Achilles bursitis, left 09/07/2021   Chronic right flank pain 06/24/2021   Acute pain of left shoulder 10/19/2020   Abnormal  gait 10/19/2020   Recurrent falls 10/19/2020   Chronic intractable headache 10/19/2020   Ganglion cyst 10/19/2020   Baker's cyst of knee, right 10/19/2020   Depo-Provera contraceptive status 02/11/2020   Hydronephrosis concurrent with and due to calculi of kidney and ureter 01/16/2020   Paralysis ([place]) 12/18/2019   Bilateral leg weakness 10/21/2019   Depression 06/19/2019   Acute right ankle pain 06/03/2019   Educated about COVID-19 virus infection 06/03/2019   Nephrolithiasis 03/24/2018   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   Obesity, Class II, BMI 35-39.9 04/11/2017   Elevated liver enzymes 04/11/2017   Extrinsic asthma 08/15/2016   Solitary pulmonary nodule 07/03/2016   Essential hypertension, benign 04/04/2016   H/O varicella 08/19/2015   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   Deformity, finger, 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   Bilateral thoracic back pain 04/21/2014   Encounter for preventive health examination 04/21/2014   Major depressive disorder, recurrent episode, moderate ([place]) 05/21/2013   Posterior right knee pain 03/26/2013   Personal history of traumatic brain injury    Eustachian tube dysfunction, bilateral 05/05/2012   Intractable chronic post-traumatic headache 02/14/2012   Bradly Chris PT, DPT  12/21/2021, 5:54 PM  [facility] [address], [place], [address] Phone: [phone]   Fax:  [phone]  Name: Margaret Zuniga MRN: [id number] Date of Birth: Nov 15, 1981

## 2021-12-21 NOTE — Addendum Note (Signed)
Addended by: Daneil Dan on: 12/21/2021 01:08 PM   Modules accepted: Orders

## 2021-12-26 ENCOUNTER — Ambulatory Visit: Payer: Medicare Other | Admitting: Physical Therapy

## 2021-12-28 ENCOUNTER — Ambulatory Visit: Payer: Medicare Other | Admitting: Physical Therapy

## 2021-12-28 DIAGNOSIS — M79672 Pain in left foot: Secondary | ICD-10-CM

## 2021-12-28 DIAGNOSIS — R262 Difficulty in walking, not elsewhere classified: Secondary | ICD-10-CM

## 2021-12-28 DIAGNOSIS — M79671 Pain in right foot: Secondary | ICD-10-CM | POA: Diagnosis not present

## 2021-12-28 NOTE — Therapy (Signed)
[facility] [address] [place], [address] Phone: [phone]   Fax:  [phone]  Physical Therapy Treatment  Patient Details  Name: Margaret Zuniga MRN: [id number] Date of Birth: [birth year] Referring Provider (PT): Dr. Sherryle Lis   Encounter Date: 12/28/2021   PT End of Session - 12/28/21 1346     Visit Number 7    Number of Visits 16    Date for PT Re-Evaluation 02/18/22    Authorization Type UHC Dual Compl/Caid    PT Start Time 1330    PT Stop Time 1415    PT Time Calculation (min) 45 min    Equipment Utilized During Treatment Gait belt   Swedish knee cage on RLE   Activity Tolerance Patient tolerated treatment well    Behavior During Therapy [facility] for tasks assessed/performed             Past Medical History:  Diagnosis Date   Bilateral nephrolithiasis 03/24/2018   Essential hypertension, benign 04/04/2016   Extrinsic asthma 08/15/2016   Headache due to trauma    chronic, takes, NSAIDs , imipramine, muscle relaxers (failed [facility])   History of kidney stones    Hypertension    Major depressive disorder, recurrent episode, moderate ([place]) 05/21/2013   Obesity (BMI 30.0-34.9) 04/11/2017   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 Marry Guan    Past Surgical History:  Procedure Laterality Date   ELBOW SURGERY Right [place] LITHOTRIPSY Left 01/22/2020   Procedure: EXTRACORPOREAL SHOCK WAVE LITHOTRIPSY (ESWL);  Surgeon: Abbie Sons, MD;  Location: [facility];  Service: Urology;  Laterality: Left;   EYE SURGERY  1995   KNEE ARTHROSCOPY WITH LATERAL MENISECTOMY Right 03/04/2020   Procedure: KNEE ARTHROSCOPY WITH PARTIAL LATERAL MENISECTOMY;  Surgeon: Hessie Knows, MD;  Location: [facility];  Service: Orthopedics;  Laterality:  Right;   LEG SURGERY  1985   SHOULDER SURGERY     SUBACROMIAL DECOMPRESSION  2000   Right shoulder, [place]   TONSILLECTOMY  2001    There were no vitals filed for this visit.   Subjective Assessment - 12/28/21 1340     Subjective Pt states that she is continuing to exprience left foot pain that has worsened since last visit to the point where she had to take seated rest break while taking her medications this past Saturday because of the heel pain. She does not think it is because of increased walking or activity.    Patient is accompained by: Family member    Pertinent History 41 y.o. female presents with the above complaint. History confirmed with patient.  She wears an [place] brace on this side.  This began to worsen over the last few weeks.  Most of it is on the back of the heel and on the bottom of the heel. Pt notes that she has been able to don and doff swedish knee cage brace on RLE and she has ordered a knee sleeve too. Pt also describes feeling pain in the back of her left knee as well.    Limitations Lifting;Walking;House hold activities;Other (comment)    How long can you sit comfortably? unlimited    How long can you stand comfortably? unlimited    How long can you walk comfortably? A couple  of minutes    Patient Stated Goals Decrease pain; be able walk 80min; stair ambulation without pain    Currently in Pain? Yes    Pain Score 9     Pain Location Foot    Pain Orientation Left    Pain Descriptors / Indicators Dull    Pain Type Chronic pain    Pain Onset More than a month ago             THEREX:  Trigger Point Release on Heel with Lacrosse Ball  Plantar Flexion Stretch on RLE 5 x 60 sec  Right Great Toe Stretch 3 x 60 sec  Eccentric Right Heel Raises 3 x 10 with BUE support   Discussion about imaging reported on 10/01/21 podiatry appointment and potential correlation to symptoms like left heel pain.              PT Education - 12/28/21 1346      Education provided Yes    Education Details form and technique for appropriate exercise    Person(s) Educated Patient    Methods Explanation;Handout;Demonstration;Verbal cues;Tactile cues    Comprehension Verbalized understanding;Tactile cues required;Returned demonstration;Verbal cues required              PT Short Term Goals - 12/28/21 1354       PT SHORT TERM GOAL #1   Title Patient will demonstrate understanding of home exercise plan.    Baseline 10/05/21: NT 11/30/21: Ongoing    Time 2    Period Weeks    Status On-going    Target Date 10/19/21               PT Long Term Goals - 12/28/21 1355       PT LONG TERM GOAL #1   Title Patient will have improved function and activity level as evidenced by an increase in FOTO score by 10 points or more.    Baseline 10/05/21: 49/67 12/21: 49/87 12/28/21: 38/87    Time 8    Period Weeks    Status On-going    Target Date 02/18/22      PT LONG TERM GOAL #2   Title Patient will demonstrate improved ankle control and gait mechanics by maintaining a neutral right ankle during initial contact to avoid increased heel contact resulting in increased heel pain.    Baseline 10/05/21: NT 11/20/22: Increased hyperextension on RLE 12/21/21: Reduced heel contact on LLE with addition of swedish knee cage on RLE    Time 8    Period Weeks    Status Achieved    Target Date 02/18/22                    Plan - 12/28/21 1404     Clinical Impression Statement Pt presents for f/u for left heel pain that is resulting from calcaneal bone spur that was reported by podiatrist on 10/01/20. This pain has not resolved and has actually worsened since initial eval despite ongoing interventions with gait deficits. 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 Pt did not experience an increase in heel pain with any of the exercises. PT recommends that pt reach out to podiatrist at apt next week to  discuss futher medical interventions if pain does not subside by Monday of next week, and to cancel PT on Monday and to keep Thursday apt. She is seeing her podiatrist on Wednesday, so she should be provided with further clarification about the medical  plan of care.    Personal Factors and Comorbidities Comorbidity 1;Time since onset of injury/illness/exacerbation;Past/Current Experience    Comorbidities TBI    Examination-Activity Limitations Locomotion Level    Examination-Participation Restrictions Other    Stability/Clinical Decision Making Evolving/Moderate complexity    Clinical Decision Making Low    Rehab Potential Fair    Clinical Impairments Affecting Rehab Potential (-)TBI with resulting prolonged nature of abnormal gait, (-) right ankle contracture , (+) Motivation, strong family support    PT Frequency 2x / week    PT Duration 8 weeks    PT Treatment/Interventions Joint Manipulations;Taping;Splinting;Manual techniques;Orthotic Fit/Training;Neuromuscular re-education;Therapeutic activities;Therapeutic exercise;Balance training;Gait training;Moist Heat;Traction;Aquatic Therapy;Cryotherapy;Dry needling;Passive range of motion    PT Next Visit Plan Take FOTO. Continue to perform gait training with swedish knee cage brace    PT Home Exercise Plan Access Code: [email or web address]  URL: [email or web address]  Date: 10/05/2021  Prepared by: Bradly Chris    Exercises  Long Sitting Calf Stretch with Strap - 1 x daily - 7 x weekly - 1 sets - 5 reps    Consulted and Agree with Plan of Care Patient            HEP includes the following:  Access Code: [email or web address] URL: [email or web address] Date: 12/28/2021 Prepared by: Bradly Chris  Exercises Long Sitting Calf Stretch with Strap - 1 x daily - 7 x weekly - 1 sets - 5 reps Sidelying Hip Abduction - 1 x daily - 3 x weekly - 3 sets - 10 reps Supine Bridge - 1 x daily - 3 x weekly - 3 sets - 10 reps Standing Eccentric Heel  Raise - 1 x daily - 3 x weekly - 3 sets - 10 reps Seated Self Great Toe Stretch - 1 x daily - 7 x weekly - 1 sets - 3 reps - 30 hold Standing Plantar Fascia Mobilization with Small Ball - 1 x daily - 7 x weekly - 1 sets - 5 reps - 60 hold Towel Scrunches - 1 x daily - 7 x weekly - 1 sets - 10 reps   Patient will benefit from skilled therapeutic intervention in order to improve the following deficits and impairments:  Pain, Decreased range of motion, Abnormal gait, Difficulty walking, Hypomobility  Visit Diagnosis: Difficulty in walking, not elsewhere classified  Pain in left foot     Problem List Patient Active Problem List   Diagnosis Date Noted   Antibiotic-induced yeast infection 12/13/2021   Sore throat 12/13/2021   Gastroesophageal reflux disease without esophagitis 12/13/2021   Sub-Achilles bursitis, left 09/07/2021   Chronic right flank pain 06/24/2021   Acute pain of left shoulder 10/19/2020   Abnormal gait 10/19/2020   Recurrent falls 10/19/2020   Chronic intractable headache 10/19/2020   Ganglion cyst 10/19/2020   Baker's cyst of knee, right 10/19/2020   Depo-Provera contraceptive status 02/11/2020   Hydronephrosis concurrent with and due to calculi of kidney and ureter 01/16/2020   Paralysis ([place]) 12/18/2019   Bilateral leg weakness 10/21/2019   Depression 06/19/2019   Acute right ankle pain 06/03/2019   Educated about COVID-19 virus infection 06/03/2019   Nephrolithiasis 03/24/2018   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   Obesity, Class II, BMI 35-39.9 04/11/2017   Elevated liver enzymes 04/11/2017   Extrinsic asthma 08/15/2016   Solitary pulmonary nodule 07/03/2016   Essential hypertension, benign 04/04/2016   H/O varicella 08/19/2015   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, [place], [address] Phone: [phone]   Fax:  [phone]  Name: Margaret Zuniga MRN: [id number] Date of Birth: [birth year]

## 2022-01-02 ENCOUNTER — Ambulatory Visit: Payer: Medicare Other

## 2022-01-02 DIAGNOSIS — R0609 Other forms of dyspnea: Secondary | ICD-10-CM | POA: Diagnosis not present

## 2022-01-02 DIAGNOSIS — J45909 Unspecified asthma, uncomplicated: Secondary | ICD-10-CM | POA: Diagnosis not present

## 2022-01-02 DIAGNOSIS — M79671 Pain in right foot: Secondary | ICD-10-CM | POA: Diagnosis not present

## 2022-01-02 DIAGNOSIS — R262 Difficulty in walking, not elsewhere classified: Secondary | ICD-10-CM

## 2022-01-02 DIAGNOSIS — M79672 Pain in left foot: Secondary | ICD-10-CM

## 2022-01-02 NOTE — Therapy (Signed)
[facility] [address], [place], [address] Phone: [phone]   Fax:  [phone]  Physical Therapy Treatment  Patient Details  Name: Margaret Zuniga MRN: [id number] Date of Birth: Aug 11, 1981 Referring Provider (PT): Dr. Sherryle Lis   Encounter Date: 01/02/2022   PT End of Session - 01/02/22 1352     Visit Number 8    Number of Visits 16    Date for PT Re-Evaluation 02/18/22    Authorization Type UHC Dual Compl/Caid    Authorization Time Period 12/01/21-02/18/22    Progress Note Due on Visit 10    PT Start Time 1336    PT Stop Time 1410    PT Time Calculation (min) 34 min    Activity Tolerance Patient tolerated treatment well    Behavior During Therapy [facility] for tasks assessed/performed             Past Medical History:  Diagnosis Date   Bilateral nephrolithiasis 03/24/2018   Essential hypertension, benign 04/04/2016   Extrinsic asthma 08/15/2016   Headache due to trauma    chronic, takes, NSAIDs , imipramine, muscle relaxers (failed [facility])   History of kidney stones    Hypertension    Major depressive disorder, recurrent episode, moderate ([place]) 05/21/2013   Obesity (BMI 30.0-34.9) 04/11/2017   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 Marry Guan    Past Surgical History:  Procedure Laterality Date   ELBOW SURGERY Right [place] LITHOTRIPSY Left 01/22/2020   Procedure: EXTRACORPOREAL SHOCK WAVE LITHOTRIPSY (ESWL);  Surgeon: Abbie Sons, MD;  Location: [facility] ORS;  Service: Urology;  Laterality: Left;   EYE SURGERY  1995   KNEE ARTHROSCOPY WITH LATERAL MENISECTOMY Right 03/04/2020   Procedure: KNEE ARTHROSCOPY WITH PARTIAL LATERAL MENISECTOMY;  Surgeon: Hessie Knows, MD;  Location: [facility] ORS;  Service: Orthopedics;  Laterality:  Right;   LEG SURGERY  1985   SHOULDER SURGERY     SUBACROMIAL DECOMPRESSION  2000   Right shoulder, [place]   TONSILLECTOMY  2001    There were no vitals filed for this visit.   Subjective Assessment - 01/02/22 1339     Subjective Pt reports pain conitnues. She was able to find some cream for her foot that seems to be helping. She continues to use the new knee cage without issue. Pt denies any major mechnical restrictions from brace. She does report SKC slows her down a bit.    Pertinent History 41 y.o. female presents with the above complaint. History confirmed with patient.  She wears an [place] brace on this side.  This began to worsen over the last few weeks.  Most of it is on the back of the heel and on the bottom of the heel. Pt notes that she has been able to don and doff swedish knee cage brace on RLE and she has ordered a knee sleeve too. Pt also describes feeling pain in the back of her left knee as well.    Currently in Pain? Yes    Pain Score 5     Pain Location Foot    Pain Orientation Left            INTERVENTION THIS DATE:  -Left heel hang stretch on stairs 3x30sec  SKC inspection RLE (maladjusted,  not permitting TKE, but limiting to ~25-30 degrees flexion) -manually resisted Left ankle PF 1x12 -LLE P/ROM 5 toe extension stretch in TKE and 0 degrees ankle DF 3x30sec  -education on potential off-loading felt to redistribute pressure in weightbearing away from central and lateral heel  *cavus foot type with forefoot valgus (supinated heel, neutral forefoot) will consider next visit -McConnell taping pulling medial fat pad laterally *education on SKC adjustment and donning in TKE- (improved gait mechanics post adjustment)    [facility] PT Assessment - 01/02/22 0001       ROM / Strength   AROM / PROM / Strength AROM      AROM   AROM Assessment Site Ankle    Right/Left Ankle Left    Left Ankle Dorsiflexion 5   AA/ROM 10 degrees with pain in heel              PT  Short Term Goals - 12/28/21 1354       PT SHORT TERM GOAL #1   Title Patient will demonstrate understanding of home exercise plan.    Baseline 10/05/21: NT 11/30/21: Ongoing    Time 2    Period Weeks    Status On-going    Target Date 10/19/21               PT Long Term Goals - 12/28/21 1355       PT LONG TERM GOAL #1   Title Patient will have improved function and activity level as evidenced by an increase in FOTO score by 10 points or more.    Baseline 10/05/21: 49/67 12/21: 49/87 12/28/21: 38/87    Time 8    Period Weeks    Status On-going    Target Date 02/18/22      PT LONG TERM GOAL #2   Title Patient will demonstrate improved ankle control and gait mechanics by maintaining a neutral right ankle during initial contact to avoid increased heel contact resulting in increased heel pain.    Baseline 10/05/21: NT 11/20/22: Increased hyperextension on RLE 12/21/21: Reduced heel contact on LLE with addition of swedish knee cage on RLE    Time 8    Period Weeks    Status Achieved    Target Date 02/18/22                   Plan - 01/02/22 1718     Clinical Impression Statement Continued with POC. [place] brace removed for stretches and strengthening. Pt reports mild increased symptoms with stretches, but within tolerable limited. Educated on best adjustment of new [place] to allow for full knee ROM without allowing recurvatum. ROM Left ankle significantly limited, but likely chronic.    Personal Factors and Comorbidities Comorbidity 1;Time since onset of injury/illness/exacerbation;Past/Current Experience    Comorbidities TBI    Examination-Activity Limitations Locomotion Level    Examination-Participation Restrictions Other    Stability/Clinical Decision Making Evolving/Moderate complexity    Clinical Decision Making Low    Rehab Potential Fair    Clinical Impairments Affecting Rehab Potential (-)TBI with resulting prolonged nature of abnormal gait, (-) right ankle  contracture , (+) Motivation, strong family support    PT Frequency 2x / week    PT Duration 8 weeks    PT Treatment/Interventions Joint Manipulations;Taping;Splinting;Manual techniques;Orthotic Fit/Training;Neuromuscular re-education;Therapeutic activities;Therapeutic exercise;Balance training;Gait training;Moist Heat;Traction;Aquatic Therapy;Cryotherapy;Dry needling;Passive range of motion    PT Next Visit Plan Continue to monitor gait mechanics with swedish knee cage, pressure offset of lateral heel, continued with stretching  and moderate level loading resistance of posterior/dorsal plantar lower leg and foot.    PT Home Exercise Plan Access Code: [email or web address]  URL: [email or web address]  Date: 10/05/2021  Prepared by: Bradly Chris    Exercises  Long Sitting Calf Stretch with Strap - 1 x daily - 7 x weekly - 1 sets - 5 reps    Consulted and Agree with Plan of Care Patient             Patient will benefit from skilled therapeutic intervention in order to improve the following deficits and impairments:  Pain, Decreased range of motion, Abnormal gait, Difficulty walking, Hypomobility  Visit Diagnosis: Difficulty in walking, not elsewhere classified  Pain in left foot  Pain in right foot     Problem List Patient Active Problem List   Diagnosis Date Noted   Antibiotic-induced yeast infection 12/13/2021   Sore throat 12/13/2021   Gastroesophageal reflux disease without esophagitis 12/13/2021   Sub-Achilles bursitis, left 09/07/2021   Chronic right flank pain 06/24/2021   Acute pain of left shoulder 10/19/2020   Abnormal gait 10/19/2020   Recurrent falls 10/19/2020   Chronic intractable headache 10/19/2020   Ganglion cyst 10/19/2020   Baker's cyst of knee, right 10/19/2020   Depo-Provera contraceptive status 02/11/2020   Hydronephrosis concurrent with and due to calculi of kidney and ureter 01/16/2020   Paralysis ([place]) 12/18/2019   Bilateral leg weakness 10/21/2019    Depression 06/19/2019   Acute right ankle pain 06/03/2019   Educated about COVID-19 virus infection 06/03/2019   Nephrolithiasis 03/24/2018   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   Obesity, Class II, BMI 35-39.9 04/11/2017   Elevated liver enzymes 04/11/2017   Extrinsic asthma 08/15/2016   Solitary pulmonary nodule 07/03/2016   Essential hypertension, benign 04/04/2016   H/O varicella 08/19/2015   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, [place], [address] Phone: [phone]   Fax:  [phone]  Name: Margaret Zuniga MRN: [id number] Date of Birth: [birth year]

## 2022-01-03 ENCOUNTER — Ambulatory Visit
Admission: RE | Admit: 2022-01-03 | Discharge: 2022-01-03 | Disposition: A | Discharge status: Home / Self Care | Payer: Medicare Other | Source: Ambulatory Visit | Attending: Obstetrics and Gynecology | Admitting: Obstetrics and Gynecology

## 2022-01-03 ENCOUNTER — Other Ambulatory Visit: Payer: Self-pay

## 2022-01-03 DIAGNOSIS — Z1231 Encounter for screening mammogram for malignant neoplasm of breast: Secondary | ICD-10-CM | POA: Diagnosis not present

## 2022-01-04 ENCOUNTER — Ambulatory Visit: Payer: Medicare Other | Admitting: Physical Therapy

## 2022-01-04 ENCOUNTER — Ambulatory Visit (INDEPENDENT_AMBULATORY_CARE_PROVIDER_SITE_OTHER): Payer: Medicare Other | Admitting: Podiatry

## 2022-01-04 DIAGNOSIS — M722 Plantar fascial fibromatosis: Secondary | ICD-10-CM | POA: Diagnosis not present

## 2022-01-04 DIAGNOSIS — M7662 Achilles tendinitis, left leg: Secondary | ICD-10-CM

## 2022-01-05 ENCOUNTER — Encounter: Payer: Self-pay | Admitting: Podiatry

## 2022-01-08 NOTE — Progress Notes (Signed)
°  Subjective:  Patient ID: Margaret Zuniga, female    DOB: [birth year],  MRN: [id number]  Chief Complaint  Patient presents with   Tendonitis     follow up 6 weeks for re-check Achilles tendon, recheck plantar fasciitis    41 y.o. female presents with the above complaint. History confirmed with patient.  Still having pain, PT has been helpful feels like she is not making much progress right now  Objective:  Physical Exam: warm, good capillary refill, no trophic changes or ulcerative lesions, normal DP and PT pulses, and normal sensory exam. Left Foot: point tenderness of the mid plantar fascia and tenderness at Achilles tendon insertion   Radiographs: Multiple views x-ray of the left foot: Cavus foot type mild  Cannula and posterior calcaneal spur Assessment:   1. Tendonitis, Achilles, left   2. Plantar fasciitis, left       Plan:  Patient was evaluated and treated and all questions answered.  Discussed the etiology and treatment options for Achilles tendinitis and plantar fasciitis including stretching, formal physical therapy with an eccentric exercises therapy plan, supportive shoegears such as a running shoe or sneaker, heel lifts, topical and oral medications.  We also discussed that I do not routinely perform injections in the area of the Achilles because of the risk of an increased damage or rupture of the tendon.  We also discussed the role of surgical treatment of this for patients who do not improve after exhausting non-surgical treatment options.  -This point she has had severe pain in this for a few months.  Physical therapy has not helped resolve the issue.  She is also taking anti-inflammatories as tolerated.  This has not helped either.  I think we should consider further procedures such as micro debridement and platelet rich plasma injection which I discussed with her.  We will order an MRI to evaluate the integrity of the tendon the possibility of a microtears or bone  marrow edema in the spur.  I will see her back after the MRI and review.    No follow-ups on file.

## 2022-01-09 ENCOUNTER — Ambulatory Visit: Payer: Medicare Other

## 2022-01-09 ENCOUNTER — Other Ambulatory Visit: Payer: Self-pay

## 2022-01-09 DIAGNOSIS — M79672 Pain in left foot: Secondary | ICD-10-CM

## 2022-01-09 DIAGNOSIS — R262 Difficulty in walking, not elsewhere classified: Secondary | ICD-10-CM | POA: Diagnosis not present

## 2022-01-09 DIAGNOSIS — M79671 Pain in right foot: Secondary | ICD-10-CM

## 2022-01-09 NOTE — Therapy (Signed)
[place] [address], [place], [address] Phone: [phone]   Fax:  [phone]  Physical Therapy Treatment  Patient Details  Name: Margaret Zuniga MRN: [id number] Date of Birth: [birth year] Referring Provider (PT): Dr. Sherryle Lis   Encounter Date: 01/09/2022   PT End of Session - 01/09/22 1340     Visit Number 9    Number of Visits 16    Date for PT Re-Evaluation 02/18/22    Authorization Type UHC Dual Compl/Caid    Authorization Time Period 12/01/21-02/18/22    Progress Note Due on Visit 10    PT Start Time 1333    PT Stop Time 1411    PT Time Calculation (min) 38 min    Equipment Utilized During Treatment Gait belt    Activity Tolerance Patient tolerated treatment well    Behavior During Therapy [facility] for tasks assessed/performed             Past Medical History:  Diagnosis Date   Bilateral nephrolithiasis 03/24/2018   Essential hypertension, benign 04/04/2016   Extrinsic asthma 08/15/2016   Headache due to trauma    chronic, takes, NSAIDs , imipramine, muscle relaxers (failed [facility])   History of kidney stones    Hypertension    Major depressive disorder, recurrent episode, moderate ([place]) 05/21/2013   Obesity (BMI 30.0-34.9) 04/11/2017   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 Marry Guan    Past Surgical History:  Procedure Laterality Date   ELBOW SURGERY Right [place] LITHOTRIPSY Left 01/22/2020   Procedure: EXTRACORPOREAL SHOCK WAVE LITHOTRIPSY (ESWL);  Surgeon: Abbie Sons, MD;  Location: [facility] ORS;  Service: Urology;  Laterality: Left;   EYE SURGERY  1995   KNEE ARTHROSCOPY WITH LATERAL MENISECTOMY Right 03/04/2020   Procedure: KNEE ARTHROSCOPY WITH PARTIAL LATERAL MENISECTOMY;  Surgeon: Hessie Knows, MD;   Location: [facility] ORS;  Service: Orthopedics;  Laterality: Right;   LEG SURGERY  1985   SHOULDER SURGERY     SUBACROMIAL DECOMPRESSION  2000   Right shoulder, [place]   TONSILLECTOMY  2001    There were no vitals filed for this visit.   Subjective Assessment - 01/09/22 1337     Subjective Pt reports symptoms largely unchanged. She reports podiatry gave her a CAM rocker and a gel heel insert which is helpign with comfort. She has not been contracted regarding schedule for MRI.    Pertinent History 41 y.o. female presents with the above complaint. History confirmed with patient.  She wears an [place] brace on this side.  This began to worsen over the last few weeks.  Most of it is on the back of the heel and on the bottom of the heel. Pt notes that she has been able to don and doff swedish knee cage brace on RLE and she has ordered a knee sleeve too. Pt also describes feeling pain in the back of her left knee as well.    Currently in Pain? Yes    Pain Score 5             INTERVENTION THIS DATE: -Left calf stretch on stairs 3x30secH  -seated soleus PF Left 1x25 @ 10lb -seated left ankle DF heel slides x20 on slider -seated soleus PF Left 1x12 @ 17.5lb -  seated left ankle DF heel slides x20 on slider -seated left ankle distraction 10x10sec (avoiding placement over heel insertion) -Left achilles IASTM over lateral insertion x3 minutes  -41mm felt insert in [place] AFO brace along instep of heel to 50% of arch support *over 147ft AMB, pt report smore uniform weight distribution over plantart handfoot *instructed to remove felt as seen fit with any new pressure hot spots detected.      PT Short Term Goals - 12/28/21 1354       PT SHORT TERM GOAL #1   Title Patient will demonstrate understanding of home exercise plan.    Baseline 10/05/21: NT 11/30/21: Ongoing    Time 2    Period Weeks    Status On-going    Target Date 10/19/21               PT Long Term Goals - 12/28/21 1355        PT LONG TERM GOAL #1   Title Patient will have improved function and activity level as evidenced by an increase in FOTO score by 10 points or more.    Baseline 10/05/21: 49/67 12/21: 49/87 12/28/21: 38/87    Time 8    Period Weeks    Status On-going    Target Date 02/18/22      PT LONG TERM GOAL #2   Title Patient will demonstrate improved ankle control and gait mechanics by maintaining a neutral right ankle during initial contact to avoid increased heel contact resulting in increased heel pain.    Baseline 10/05/21: NT 11/20/22: Increased hyperextension on RLE 12/21/21: Reduced heel contact on LLE with addition of swedish knee cage on RLE    Time 8    Period Weeks    Status Achieved    Target Date 02/18/22                   Plan - 01/09/22 1342     Clinical Impression Statement Conitnued with targeted therapies. [place] AFO removed for session Focus on mobiliy and strength in ankle, as well as targeted loading of tissue to stimulate tenocytes. Pt reports no significant change in symptoms during or after session. Adjustments to instep favorable this date. Pt encouraged to schedule more treatment visits as she has not been able to have consistent treatments since starting in October.    Personal Factors and Comorbidities Comorbidity 1;Time since onset of injury/illness/exacerbation;Past/Current Experience    Comorbidities TBI    Examination-Activity Limitations Locomotion Level    Examination-Participation Restrictions Other    Stability/Clinical Decision Making Evolving/Moderate complexity    Clinical Decision Making Low    Rehab Potential Fair    Clinical Impairments Affecting Rehab Potential (-)TBI with resulting prolonged nature of abnormal gait, (-) right ankle contracture , (+) Motivation, strong family support    PT Frequency 2x / week    PT Duration 8 weeks    PT Treatment/Interventions Joint Manipulations;Taping;Splinting;Manual techniques;Orthotic  Fit/Training;Neuromuscular re-education;Therapeutic activities;Therapeutic exercise;Balance training;Gait training;Moist Heat;Traction;Aquatic Therapy;Cryotherapy;Dry needling;Passive range of motion    PT Next Visit Plan Continue to monitor gait mechanics with swedish knee cage, pressure offset of lateral heel, continued with stretching and moderate level loading resistance of posterior/dorsal plantar lower leg and foot.    PT Home Exercise Plan Access Code: [email or web address]  URL: [email or web address]  Date: 10/05/2021  Prepared by: Bradly Chris    Exercises  Long Sitting Calf Stretch with Strap - 1 x daily - 7 x weekly - 1 sets - 5 reps  Consulted and Agree with Plan of Care Patient             Patient will benefit from skilled therapeutic intervention in order to improve the following deficits and impairments:  Pain, Decreased range of motion, Abnormal gait, Difficulty walking, Hypomobility  Visit Diagnosis: Difficulty in walking, not elsewhere classified  Pain in left foot  Pain in right foot     Problem List Patient Active Problem List   Diagnosis Date Noted   Antibiotic-induced yeast infection 12/13/2021   Sore throat 12/13/2021   Gastroesophageal reflux disease without esophagitis 12/13/2021   Sub-Achilles bursitis, left 09/07/2021   Chronic right flank pain 06/24/2021   Acute pain of left shoulder 10/19/2020   Abnormal gait 10/19/2020   Recurrent falls 10/19/2020   Chronic intractable headache 10/19/2020   Ganglion cyst 10/19/2020   Baker's cyst of knee, right 10/19/2020   Depo-Provera contraceptive status 02/11/2020   Hydronephrosis concurrent with and due to calculi of kidney and ureter 01/16/2020   Paralysis ([place]) 12/18/2019   Bilateral leg weakness 10/21/2019   Depression 06/19/2019   Acute right ankle pain 06/03/2019   Educated about COVID-19 virus infection 06/03/2019   Nephrolithiasis 03/24/2018   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   Obesity, Class II,  BMI 35-39.9 04/11/2017   Elevated liver enzymes 04/11/2017   Extrinsic asthma 08/15/2016   Solitary pulmonary nodule 07/03/2016   Essential hypertension, benign 04/04/2016   H/O varicella 08/19/2015   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, [place], [address] Phone: [phone]   Fax:  [phone]  Name: Margaret Zuniga MRN: [id number] Date of Birth: 09/06/1981

## 2022-01-11 ENCOUNTER — Ambulatory Visit: Payer: Medicare Other | Attending: Podiatry

## 2022-01-11 ENCOUNTER — Other Ambulatory Visit: Payer: Self-pay

## 2022-01-11 DIAGNOSIS — M79671 Pain in right foot: Secondary | ICD-10-CM | POA: Insufficient documentation

## 2022-01-11 DIAGNOSIS — R262 Difficulty in walking, not elsewhere classified: Secondary | ICD-10-CM | POA: Insufficient documentation

## 2022-01-11 DIAGNOSIS — M79672 Pain in left foot: Secondary | ICD-10-CM | POA: Diagnosis not present

## 2022-01-11 NOTE — Therapy (Signed)
[facility] [address], [place], [address] Phone: [phone]   Fax:  [phone]  Physical Therapy Treatment Physical Therapy Progress Note   Dates of reporting period  12/01/22   to   02/18/22   Patient Details  Name: Margaret Zuniga MRN: [id number] Date of Birth: [birth year] Referring Provider (PT): Dr. Sherryle Lis   Encounter Date: 01/11/2022   PT End of Session - 01/11/22 1559     Visit Number 10    Number of Visits 16    Date for PT Re-Evaluation 02/18/22    Authorization Type [employer] Dual Compl/Caid    Authorization Time Period 12/01/21-02/18/22    Progress Note Due on Visit 20    PT Start Time 1505    PT Stop Time 1545    PT Time Calculation (min) 40 min    Activity Tolerance Patient tolerated treatment well;No increased pain    Behavior During Therapy [facility] for tasks assessed/performed             Past Medical History:  Diagnosis Date   Bilateral nephrolithiasis 03/24/2018   Essential hypertension, benign 04/04/2016   Extrinsic asthma 08/15/2016   Headache due to trauma    chronic, takes, NSAIDs , imipramine, muscle relaxers (failed [facility])   History of kidney stones    Hypertension    Major depressive disorder, recurrent episode, moderate ([place]) 05/21/2013   Obesity (BMI 30.0-34.9) 04/11/2017   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 Marry Guan    Past Surgical History:  Procedure Laterality Date   ELBOW SURGERY Right [place] LITHOTRIPSY Left 01/22/2020   Procedure: EXTRACORPOREAL SHOCK WAVE LITHOTRIPSY (ESWL);  Surgeon: Abbie Sons, MD;  Location: [facility] ORS;  Service: Urology;  Laterality: Left;   EYE SURGERY  1995   KNEE ARTHROSCOPY WITH LATERAL MENISECTOMY Right 03/04/2020   Procedure: KNEE ARTHROSCOPY WITH  PARTIAL LATERAL MENISECTOMY;  Surgeon: Hessie Knows, MD;  Location: [facility] OR[facility];  Service: Orthopedics;  Laterality: Right;   LEG SURGERY  1985   SHOULDER SURGERY     SUBACROMIAL DECOMPRESSION  2000   Right shoulder, [place]   TONSILLECTOMY  2001    There were no vitals filed for this visit.   Subjective Assessment - 01/11/22 1509     Subjective Pt in a lot of pain yesterday, also today, unlcear why. Pt does not feel that pain is related to activity in PT session. Pt did have to stand in a "long line" at a funeral yesterday waiting to give condolences.    Pertinent History 41 y.o. female presents with the above complaint. History confirmed with patient.  She wears an [place] brace on this side.  This began to worsen over the last few weeks.  Most of it is on the back of the heel and on the bottom of the heel. Pt notes that she has been able to don and doff swedish knee cage brace on RLE and she has ordered a knee sleeve too. Pt also describes feeling pain in the back of her left knee as well.    Currently in Pain? Yes    Pain Score 7    left lateral and central heel            INTERVENTION THIS DATE: -Left heel  hang off step stretch 3x30sec  -Supine Left ankle PROM stretch 3x30sec (knee bent)  -Supine left ankle ART calf + ankle DF x3 minutes   -Manually Resisted Ankle PF 2x10 -Double yellow TB 2x12 ankle DF -ART to calf and DF stretch  -Yellow TB Left ankle prontation 1x12 -Resisted Ankle PF Left with pronation emphasis 1x10 -normal stance heel isometric Left foot pronation instance (weight shift to 1st MTP)  -normal stance Rt trunk rotation for proiprioception of Left foot ankle -normal stance Lt trunk rotation with cues to maintain active Left 1st MTP contact with floor      PT Education - 01/11/22 1558     Education provided Yes    Education Details potential for less restrictive Left AFO to allow for improved mechanics; trial use of CAM rocker to have a AFO-free holiday     Person(s) Educated Patient    Methods Explanation    Comprehension Verbalized understanding              PT Short Term Goals - 12/28/21 1354       PT SHORT TERM GOAL #1   Title Patient will demonstrate understanding of home exercise plan.    Baseline 10/05/21: NT 11/30/21: Ongoing    Time 2    Period Weeks    Status On-going    Target Date 10/19/21               PT Long Term Goals - 12/28/21 1355       PT LONG TERM GOAL #1   Title Patient will have improved function and activity level as evidenced by an increase in FOTO score by 10 points or more.    Baseline 10/05/21: 49/67 12/21: 49/87 12/28/21: 38/87    Time 8    Period Weeks    Status On-going    Target Date 02/18/22      PT LONG TERM GOAL #2   Title Patient will demonstrate improved ankle control and gait mechanics by maintaining a neutral right ankle during initial contact to avoid increased heel contact resulting in increased heel pain.    Baseline 10/05/21: NT 11/20/22: Increased hyperextension on RLE 12/21/21: Reduced heel contact on LLE with addition of swedish knee cage on RLE    Time 8    Period Weeks    Status Achieved    Target Date 02/18/22                   Plan - 01/11/22 1559     Clinical Impression Statement Continued with Left ankle ROM, soft tissue mobility, strengthening and ankle/foot postural motor control in open and closed kinetic chains. Pt continues to have severe supination from midstance to toe-off despite having fair ROM and fair strength, AFO does restrict potential for normal mechnics quite a bit due to pain. Pt toelrates stretching and strengthening fairly well.    Personal Factors and Comorbidities Comorbidity 1;Time since onset of injury/illness/exacerbation;Past/Current Experience    Comorbidities TBI    Examination-Activity Limitations Locomotion Level    Examination-Participation Restrictions Other    Stability/Clinical Decision Making Evolving/Moderate  complexity    Clinical Decision Making Low    Rehab Potential Fair    Clinical Impairments Affecting Rehab Potential (-)TBI with resulting prolonged nature of abnormal gait, (-) right ankle contracture , (+) Motivation, strong family support    PT Frequency 2x / week    PT Duration 8 weeks    PT Treatment/Interventions Joint Manipulations;Taping;Splinting;Manual techniques;Orthotic Fit/Training;Neuromuscular re-education;Therapeutic activities;Therapeutic exercise;Balance training;Gait  training;Moist Heat;Traction;Aquatic Therapy;Cryotherapy;Dry needling;Passive range of motion    PT Home Exercise Plan Access Code: [email or web address]  URL: [email or web address]  Date: 10/05/2021  Prepared by: Bradly Chris    Exercises  Long Sitting Calf Stretch with Strap - 1 x daily - 7 x weekly - 1 sets - 5 reps    Consulted and Agree with Plan of Care Patient             Patient will benefit from skilled therapeutic intervention in order to improve the following deficits and impairments:  Pain, Decreased range of motion, Abnormal gait, Difficulty walking, Hypomobility  Visit Diagnosis: Difficulty in walking, not elsewhere classified  Pain in left foot  Pain in right foot     Problem List Patient Active Problem List   Diagnosis Date Noted   Antibiotic-induced yeast infection 12/13/2021   Sore throat 12/13/2021   Gastroesophageal reflux disease without esophagitis 12/13/2021   Sub-Achilles bursitis, left 09/07/2021   Chronic right flank pain 06/24/2021   Acute pain of left shoulder 10/19/2020   Abnormal gait 10/19/2020   Recurrent falls 10/19/2020   Chronic intractable headache 10/19/2020   Ganglion cyst 10/19/2020   Baker's cyst of knee, right 10/19/2020   Depo-Provera contraceptive status 02/11/2020   Hydronephrosis concurrent with and due to calculi of kidney and ureter 01/16/2020   Paralysis ([place]) 12/18/2019   Bilateral leg weakness 10/21/2019   Depression 06/19/2019   Acute  right ankle pain 06/03/2019   Educated about COVID-19 virus infection 06/03/2019   Nephrolithiasis 03/24/2018   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   Obesity, Class II, BMI 35-39.9 04/11/2017   Elevated liver enzymes 04/11/2017   Extrinsic asthma 08/15/2016   Solitary pulmonary nodule 07/03/2016   Essential hypertension, benign 04/04/2016   H/O varicella 08/19/2015   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   Deformity, finger, 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   Bilateral thoracic back pain 04/21/2014   Encounter for preventive health examination 04/21/2014   Major depressive disorder, recurrent episode, moderate ([place]) 05/21/2013   Posterior right knee pain 03/26/2013   Personal history of traumatic brain injury    Eustachian tube dysfunction, bilateral 05/05/2012   Intractable chronic post-traumatic headache 02/14/2012   4:15 PM, 01/11/22 Etta Grandchild, PT, DPT Physical Therapist - [place] [phone] (Office)   [place] C, PT 01/11/2022, 4:09 PM  [facility] [address], [place], [address] Phone: [phone]   Fax:  [phone]  Name: Margaret Zuniga MRN: [id number] Date of Birth: [birth year]

## 2022-01-16 ENCOUNTER — Ambulatory Visit: Payer: Medicare Other

## 2022-01-16 ENCOUNTER — Other Ambulatory Visit: Payer: Self-pay

## 2022-01-16 ENCOUNTER — Ambulatory Visit
Admission: RE | Admit: 2022-01-16 | Discharge: 2022-01-16 | Disposition: A | Discharge status: Home / Self Care | Payer: Medicare Other | Source: Ambulatory Visit | Attending: Podiatry | Admitting: Podiatry

## 2022-01-16 DIAGNOSIS — R262 Difficulty in walking, not elsewhere classified: Secondary | ICD-10-CM

## 2022-01-16 DIAGNOSIS — M7662 Achilles tendinitis, left leg: Secondary | ICD-10-CM

## 2022-01-16 DIAGNOSIS — M79671 Pain in right foot: Secondary | ICD-10-CM

## 2022-01-16 DIAGNOSIS — M79672 Pain in left foot: Secondary | ICD-10-CM

## 2022-01-16 DIAGNOSIS — M65872 Other synovitis and tenosynovitis, left ankle and foot: Secondary | ICD-10-CM | POA: Diagnosis not present

## 2022-01-16 DIAGNOSIS — M722 Plantar fascial fibromatosis: Secondary | ICD-10-CM | POA: Diagnosis not present

## 2022-01-16 NOTE — Therapy (Signed)
[facility] [address], [place], [address] Phone: [phone]   Fax:  [phone]  Physical Therapy Treatment  Patient Details  Name: Margaret Zuniga MRN: [id number] Date of Birth: 04-21-1981 Referring Provider (PT): Dr. Sherryle Lis   Encounter Date: 01/16/2022   PT End of Session - 01/16/22 1429     Visit Number 11    Number of Visits 16    Date for PT Re-Evaluation 02/18/22    Authorization Time Period 12/01/21-02/18/22    Progress Note Due on Visit 20    PT Start Time 9983    PT Stop Time 3825    PT Time Calculation (min) 40 min    Activity Tolerance Patient tolerated treatment well;No increased pain    Behavior During Therapy [facility] for tasks assessed/performed             Past Medical History:  Diagnosis Date   Bilateral nephrolithiasis 03/24/2018   Essential hypertension, benign 04/04/2016   Extrinsic asthma 08/15/2016   Headache due to trauma    chronic, takes, NSAIDs , imipramine, muscle relaxers (failed [facility])   History of kidney stones    Hypertension    Major depressive disorder, recurrent episode, moderate ([place]) 05/21/2013   Obesity (BMI 30.0-34.9) 04/11/2017   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 Marry Guan    Past Surgical History:  Procedure Laterality Date   ELBOW SURGERY Right [place] LITHOTRIPSY Left 01/22/2020   Procedure: EXTRACORPOREAL SHOCK WAVE LITHOTRIPSY (ESWL);  Surgeon: Abbie Sons, MD;  Location: [facility];  Service: Urology;  Laterality: Left;   EYE SURGERY  1995   KNEE ARTHROSCOPY WITH LATERAL MENISECTOMY Right 03/04/2020   Procedure: KNEE ARTHROSCOPY WITH PARTIAL LATERAL MENISECTOMY;  Surgeon: Hessie Knows, MD;  Location: [facility] OR[facility];  Service: Orthopedics;  Laterality: Right;   LEG SURGERY   1985   SHOULDER SURGERY     SUBACROMIAL DECOMPRESSION  2000   Right shoulder, [place]   TONSILLECTOMY  2001    There were no vitals filed for this visit.   Subjective Assessment - 01/16/22 1421     Subjective Pt reports she had a pretty significat amount of pain ovetr the weekend. Pt reports she did not have any notable improvement in symptoms after last treatment session. Pt had MRI this morning. Pt also reports that the last 4 weekends she has noticed that her showering routine seems to be what increases her painover the weekend. Pt wants to hold off PT next week until she sees podiatrist next week. Pt DID in fact try to use her CAM rocker over the weekend rather than the [place] AFO to see if a holiday provided some relief, however no improvement in pain was seen    Pertinent History 41 y.o. female presents with the above complaint. History confirmed with patient.  She wears an [place] brace on this side.  This began to worsen over the last few weeks.  Most of it is on the back of the heel and on the bottom of the heel. Pt notes that she has been able to don and doff swedish knee cage brace on RLE and she has ordered a knee sleeve too. Pt also describes feeling pain in the back of her left knee as well.  Currently in Pain? Yes    Pain Score 7     Pain Location --   left lateral heel             INTERVENTION THIS DATE: -Left heel hang off step calf/achilles stretch 5x30sec  -Left ankle DF 1x15 @ 2lb AW -Left ankle inversion yellow TB loop 1x15  -Left ankle DF 1x15 @ 3lb AW -Left ankle inversion yellow TB loop 1x15  -Left ankle DF 1x15 @ 4lb AW  -Left ankle eversion yellow TB loop 1x15 -Manually resisted Left ankle PF 1x10  Left ankle eversion yellow TB loop 1x15 -Left ankle eversion yellow TB loop 1x15  -Left ankle cable resisted PF in full range 1x15 @ 5lb (more pain than stretch, pain throughout range)  -Left ankle cable resisted PF in 0 degrees to full elevation range 1x10  @ 10lb            PT Education - 01/16/22 1428     Education provided Yes    Education Details DIscussing treatmen toptions moving forward.    Person(s) Educated Patient    Methods Explanation    Comprehension Verbalized understanding;Returned demonstration              PT Short Term Goals - 12/28/21 1354       PT SHORT TERM GOAL #1   Title Patient will demonstrate understanding of home exercise plan.    Baseline 10/05/21: NT 11/30/21: Ongoing    Time 2    Period Weeks    Status On-going    Target Date 10/19/21               PT Long Term Goals - 12/28/21 1355       PT LONG TERM GOAL #1   Title Patient will have improved function and activity level as evidenced by an increase in FOTO score by 10 points or more.    Baseline 10/05/21: 49/67 12/21: 49/87 12/28/21: 38/87    Time 8    Period Weeks    Status On-going    Target Date 02/18/22      PT LONG TERM GOAL #2   Title Patient will demonstrate improved ankle control and gait mechanics by maintaining a neutral right ankle during initial contact to avoid increased heel contact resulting in increased heel pain.    Baseline 10/05/21: NT 11/20/22: Increased hyperextension on RLE 12/21/21: Reduced heel contact on LLE with addition of swedish knee cage on RLE    Time 8    Period Weeks    Status Achieved    Target Date 02/18/22                   Plan - 01/16/22 1433     Clinical Impression Statement Symptoms modification continues to be futile. Expanded time and intensity of loading this date, especially in calf/achilles complex. Pt demonstrates generally good activation of all ankle muscle groups, ROM is generally good also. Max loading of calf today on cable resistance is at 10lb which is not especially high given the typical capacity of the tendon, unclear that calf strength will accommodate enough force to generate a typical response of pain resolution. No HEP updates at this time.    Personal  Factors and Comorbidities Comorbidity 1;Time since onset of injury/illness/exacerbation;Past/Current Experience    Comorbidities TBI    Examination-Activity Limitations Locomotion Level    Examination-Participation Restrictions Other    Stability/Clinical Decision Making Evolving/Moderate complexity    Clinical Decision Making Low  Rehab Potential Fair    Clinical Impairments Affecting Rehab Potential (-)TBI with resulting prolonged nature of abnormal gait, (-) right ankle contracture , (+) Motivation, strong family support    PT Frequency 2x / week    PT Duration 8 weeks    PT Treatment/Interventions Joint Manipulations;Taping;Splinting;Manual techniques;Orthotic Fit/Training;Neuromuscular re-education;Therapeutic activities;Therapeutic exercise;Balance training;Gait training;Moist Heat;Traction;Aquatic Therapy;Cryotherapy;Dry needling;Passive range of motion    PT Next Visit Plan Continue to monitor gait mechanics with swedish knee cage, pressure offset of lateral heel, continued with stretching and moderate level loading resistance of posterior/dorsal plantar lower leg and foot.    PT Home Exercise Plan Access Code: [email or web address]  URL: [email or web address]  Date: 10/05/2021  Prepared by: Bradly Chris    Exercises  Long Sitting Calf Stretch with Strap - 1 x daily - 7 x weekly - 1 sets - 5 reps    Consulted and Agree with Plan of Care Patient             Patient will benefit from skilled therapeutic intervention in order to improve the following deficits and impairments:  Pain, Decreased range of motion, Abnormal gait, Difficulty walking, Hypomobility  Visit Diagnosis: Difficulty in walking, not elsewhere classified  Pain in left foot  Pain in right foot     Problem List Patient Active Problem List   Diagnosis Date Noted   Antibiotic-induced yeast infection 12/13/2021   Sore throat 12/13/2021   Gastroesophageal reflux disease without esophagitis 12/13/2021    Sub-Achilles bursitis, left 09/07/2021   Chronic right flank pain 06/24/2021   Acute pain of left shoulder 10/19/2020   Abnormal gait 10/19/2020   Recurrent falls 10/19/2020   Chronic intractable headache 10/19/2020   Ganglion cyst 10/19/2020   Baker's cyst of knee, right 10/19/2020   Depo-Provera contraceptive status 02/11/2020   Hydronephrosis concurrent with and due to calculi of kidney and ureter 01/16/2020   Paralysis ([place]) 12/18/2019   Bilateral leg weakness 10/21/2019   Depression 06/19/2019   Acute right ankle pain 06/03/2019   Educated about COVID-19 virus infection 06/03/2019   Nephrolithiasis 03/24/2018   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   Obesity, Class II, BMI 35-39.9 04/11/2017   Elevated liver enzymes 04/11/2017   Extrinsic asthma 08/15/2016   Solitary pulmonary nodule 07/03/2016   Essential hypertension, benign 04/04/2016   H/O varicella 08/19/2015   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   Deformity, finger, 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   Bilateral thoracic back pain 04/21/2014   Encounter for preventive health examination 04/21/2014   Major depressive disorder, recurrent episode, moderate ([place]) 05/21/2013   Posterior right knee pain 03/26/2013   Personal history of traumatic brain injury    Eustachian tube dysfunction, bilateral 05/05/2012   Intractable chronic post-traumatic headache 02/14/2012   3:03 PM, 01/16/22 Etta Grandchild, PT, DPT Physical Therapist - [place] [phone] (Office)   [place] C, PT 01/16/2022, 2:36 PM  [facility] [address] [place], [address] Phone: [phone]   Fax:  [phone]  Name: Margaret Zuniga MRN: [id number] Date of Birth: April 08, 1981

## 2022-01-18 ENCOUNTER — Ambulatory Visit: Payer: Medicare Other | Admitting: Physical Therapy

## 2022-01-18 DIAGNOSIS — M791 Myalgia, unspecified site: Secondary | ICD-10-CM | POA: Diagnosis not present

## 2022-01-18 DIAGNOSIS — M542 Cervicalgia: Secondary | ICD-10-CM | POA: Diagnosis not present

## 2022-01-18 DIAGNOSIS — G43719 Chronic migraine without aura, intractable, without status migrainosus: Secondary | ICD-10-CM | POA: Diagnosis not present

## 2022-01-18 DIAGNOSIS — G518 Other disorders of facial nerve: Secondary | ICD-10-CM | POA: Diagnosis not present

## 2022-01-24 ENCOUNTER — Ambulatory Visit: Payer: Medicare Other | Admitting: Physical Therapy

## 2022-01-25 ENCOUNTER — Other Ambulatory Visit: Payer: Self-pay

## 2022-01-25 ENCOUNTER — Ambulatory Visit (INDEPENDENT_AMBULATORY_CARE_PROVIDER_SITE_OTHER): Payer: Medicare Other | Admitting: Podiatry

## 2022-01-25 DIAGNOSIS — M216X2 Other acquired deformities of left foot: Secondary | ICD-10-CM | POA: Diagnosis not present

## 2022-01-25 DIAGNOSIS — M722 Plantar fascial fibromatosis: Secondary | ICD-10-CM

## 2022-01-25 DIAGNOSIS — M7672 Peroneal tendinitis, left leg: Secondary | ICD-10-CM

## 2022-01-25 DIAGNOSIS — M7662 Achilles tendinitis, left leg: Secondary | ICD-10-CM

## 2022-01-26 ENCOUNTER — Telehealth: Payer: Self-pay

## 2022-01-26 NOTE — Telephone Encounter (Signed)
Received surgery paperwork from the [place] office. Left a message for Roquel to call so we can schedule surgery with Dr. Sherryle Lis

## 2022-01-29 NOTE — Progress Notes (Signed)
°  Subjective:  Patient ID: Margaret Zuniga, female    DOB: 19-Mar-1981,  MRN: [id number]  Follow-up after MRI  41 y.o. female presents with the above complaint. History confirmed with patient.  She still having quite a bit of pain completed the MRI. Objective:  Physical Exam: warm, good capillary refill, no trophic changes or ulcerative lesions, normal DP and PT pulses, and normal sensory exam. Left Foot: point tenderness of the mid plantar fascia and tenderness at Achilles tendon insertion, pain in the lateral ankle in the retromalleolar groove along the peroneus brevis, inferior heel pain as well.     Radiographs: Multiple views x-ray of the left foot: Cavus foot type mild  Cannula and posterior calcaneal spur  IMPRESSION: 1. Moderate tendinosis of the peroneus brevis with a longitudinal split tear with mild tenosynovitis. 2. Mild increased signal in the lateral band of the plantar fascia as can be seen with mild plantar fasciitis.     Electronically Signed   By: Kathreen Devoid M.D.   On: 01/17/2022 07:12   Assessment:   1. Acquired equinus deformity of left foot   2. Tendonitis, Achilles, left   3. Plantar fasciitis, left   4. Peroneal tendinitis of left lower extremity       Plan:  Patient was evaluated and treated and all questions answered.  She is still having quite a bit of pain for both her Achilles tendinitis and Planter fasciitis and some of this is also likely peroneal tendinitis.  I reviewed the results of the MRI with her.  At this point she has exhausted bracing physical therapy and anti-inflammatories.  I think we should proceed with further intervention.  We discussed Topaz and platelet rich plasma injection as well as a gastrocnemius recession.  Discussed the risk benefits and potential complications of this including but not limited to pain, swelling, infection, scar, numbness which may be temporary or permanent, chronic pain, stiffness, nerve pain or damage, wound  healing problems, bone healing problems including delayed or non-union.  All questions were addressed.  Informed consent was signed and reviewed.   Surgical plan:  Procedure: -Left lower extremity gastrocnemius recession, Topaz procedure of Achilles tendon, plantar fascia and peroneal tendons  Location: -[facility]  Anesthesia plan: -IV sedation with regional block  Postoperative pain plan: - Tylenol 1000 mg every 6 hours, ibuprofen 600 mg every 6 hours, gabapentin 300 mg every 8 hours x5 days, oxycodone 5 mg 1-2 tabs every 6 hours only as needed  DVT prophylaxis: -None required  WB Restrictions / DME needs: -WBAT in CAM boot which she has    No follow-ups on file.

## 2022-01-30 ENCOUNTER — Other Ambulatory Visit: Payer: Self-pay

## 2022-01-30 ENCOUNTER — Ambulatory Visit: Payer: Medicare Other

## 2022-01-30 ENCOUNTER — Encounter: Payer: Self-pay | Admitting: Physical Therapy

## 2022-01-30 DIAGNOSIS — R262 Difficulty in walking, not elsewhere classified: Secondary | ICD-10-CM | POA: Diagnosis not present

## 2022-01-30 DIAGNOSIS — M79672 Pain in left foot: Secondary | ICD-10-CM

## 2022-01-30 DIAGNOSIS — M79671 Pain in right foot: Secondary | ICD-10-CM

## 2022-01-30 NOTE — Therapy (Signed)
[facility] [address], [place], [address] Phone: [phone]   Fax:  [phone]  Physical Therapy Treatment  Patient Details  Name: Margaret Zuniga MRN: [id number] Date of Birth: 1981/03/28 Referring Provider (PT): Dr. Sherryle Lis   Encounter Date: 01/30/2022   PT End of Session - 01/30/22 1553     Visit Number 12    Number of Visits 16    Date for PT Re-Evaluation 02/18/22    Authorization Time Period 12/01/21-02/18/22    Progress Note Due on Visit 20    PT Start Time 8453    PT Stop Time 1627    PT Time Calculation (min) 40 min    Activity Tolerance Patient tolerated treatment well;No increased pain    Behavior During Therapy [facility] for tasks assessed/performed             Past Medical History:  Diagnosis Date   Bilateral nephrolithiasis 03/24/2018   Essential hypertension, benign 04/04/2016   Extrinsic asthma 08/15/2016   Headache due to trauma    chronic, takes, NSAIDs , imipramine, muscle relaxers (failed [facility])   History of kidney stones    Hypertension    Major depressive disorder, recurrent episode, moderate ([place]) 05/21/2013   Obesity (BMI 30.0-34.9) 04/11/2017   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 Marry Guan    Past Surgical History:  Procedure Laterality Date   ELBOW SURGERY Right [place] LITHOTRIPSY Left 01/22/2020   Procedure: EXTRACORPOREAL SHOCK WAVE LITHOTRIPSY (ESWL);  Surgeon: Abbie Sons, MD;  Location: [facility] ORS;  Service: Urology;  Laterality: Left;   EYE SURGERY  1995   KNEE ARTHROSCOPY WITH LATERAL MENISECTOMY Right 03/04/2020   Procedure: KNEE ARTHROSCOPY WITH PARTIAL LATERAL MENISECTOMY;  Surgeon: Hessie Knows, MD;  Location: [facility] ORS;  Service: Orthopedics;  Laterality: Right;   LEG SURGERY   1985   SHOULDER SURGERY     SUBACROMIAL DECOMPRESSION  2000   Right shoulder, [place]   TONSILLECTOMY  2001    There were no vitals filed for this visit.   Subjective Assessment - 01/30/22 1549     Subjective Pt endorsing having surgery on April 14th for L foot pain. wishing to finish out cert periuod prior to operation and will plan to return after operation. Reports PT has been helpful overall. 8/10 NPS currently in L foot.    Pertinent History 41 y.o. female presents with the above complaint. History confirmed with patient.  She wears an [place] brace on this side.  This began to worsen over the last few weeks.  Most of it is on the back of the heel and on the bottom of the heel. Pt notes that she has been able to don and doff swedish knee cage brace on RLE and she has ordered a knee sleeve too. Pt also describes feeling pain in the back of her left knee as well.    Currently in Pain? Yes    Pain Score 8     Pain Location --   posterolateral heel on L side   Pain Orientation Left              There.ex:   Left heel hang off step calf/achilles stretch 5x30sec. VC's for neutral foot positioning as pt likes to invert  foot.  L ankle DF in sitting: 4# AW, 2x15 L ankle inversion RTB: 2x12 L ankle eversion RTB: 2x12 Standing ankle PF with L heel over bolster and RLE on floor: 3x15, VC's for form/technique and eccentric control. At [facility], seated resisted ankle DF and PF: 2x10/direction, 5#.    PT Education - 01/30/22 1552     Education provided Yes    Education Details Form/technique with exercises. Cert period and finishing POC.    Person(s) Educated Patient    Methods Explanation;Demonstration;Tactile cues;Verbal cues    Comprehension Verbalized understanding;Returned demonstration              PT Short Term Goals - 12/28/21 1354       PT SHORT TERM GOAL #1   Title Patient will demonstrate understanding of home exercise plan.    Baseline 10/05/21: NT 11/30/21: Ongoing     Time 2    Period Weeks    Status On-going    Target Date 10/19/21               PT Long Term Goals - 12/28/21 1355       PT LONG TERM GOAL #1   Title Patient will have improved function and activity level as evidenced by an increase in FOTO score by 10 points or more.    Baseline 10/05/21: 49/67 12/21: 49/87 12/28/21: 38/87    Time 8    Period Weeks    Status On-going    Target Date 02/18/22      PT LONG TERM GOAL #2   Title Patient will demonstrate improved ankle control and gait mechanics by maintaining a neutral right ankle during initial contact to avoid increased heel contact resulting in increased heel pain.    Baseline 10/05/21: NT 11/20/22: Increased hyperextension on RLE 12/21/21: Reduced heel contact on LLE with addition of swedish knee cage on RLE    Time 8    Period Weeks    Status Achieved    Target Date 02/18/22                   Plan - 01/30/22 1602     Clinical Impression Statement Continuing PT POC with focus on L foot mobility and ankle strenghthening. Remains heavily supinated despite cuing and displaying adequate foot mobility to maintain a neutral foot position. Pt tolerating progression of resistance without an increase in pain with frontal plane motions. Pt will continue to benefit from skilled PT service to make as much progress as possible with pain and strength prior to end of cert date.    Personal Factors and Comorbidities Comorbidity 1;Time since onset of injury/illness/exacerbation;Past/Current Experience    Comorbidities TBI    Examination-Activity Limitations Locomotion Level    Examination-Participation Restrictions Other    Stability/Clinical Decision Making Evolving/Moderate complexity    Rehab Potential Fair    Clinical Impairments Affecting Rehab Potential (-)TBI with resulting prolonged nature of abnormal gait, (-) right ankle contracture , (+) Motivation, strong family support    PT Frequency 2x / week    PT Duration 8 weeks     PT Treatment/Interventions Joint Manipulations;Taping;Splinting;Manual techniques;Orthotic Fit/Training;Neuromuscular re-education;Therapeutic activities;Therapeutic exercise;Balance training;Gait training;Moist Heat;Traction;Aquatic Therapy;Cryotherapy;Dry needling;Passive range of motion    PT Next Visit Plan Continue to monitor gait mechanics with swedish knee cage, pressure offset of lateral heel, continued with stretching and moderate level loading resistance of posterior/dorsal plantar lower leg and foot.    PT Home Exercise Plan Access Code: [email or web address]  URL: [email or web address]  Date: 10/05/2021  Prepared by: Bradly Chris    Exercises  Long Sitting Calf Stretch with Strap - 1 x daily - 7 x weekly - 1 sets - 5 reps    Consulted and Agree with Plan of Care Patient             Patient will benefit from skilled therapeutic intervention in order to improve the following deficits and impairments:  Pain, Decreased range of motion, Abnormal gait, Difficulty walking, Hypomobility  Visit Diagnosis: Difficulty in walking, not elsewhere classified  Pain in left foot  Pain in right foot     Problem List Patient Active Problem List   Diagnosis Date Noted   Antibiotic-induced yeast infection 12/13/2021   Sore throat 12/13/2021   Gastroesophageal reflux disease without esophagitis 12/13/2021   Sub-Achilles bursitis, left 09/07/2021   Chronic right flank pain 06/24/2021   Acute pain of left shoulder 10/19/2020   Abnormal gait 10/19/2020   Recurrent falls 10/19/2020   Chronic intractable headache 10/19/2020   Ganglion cyst 10/19/2020   Baker's cyst of knee, right 10/19/2020   Depo-Provera contraceptive status 02/11/2020   Hydronephrosis concurrent with and due to calculi of kidney and ureter 01/16/2020   Paralysis ([place]) 12/18/2019   Bilateral leg weakness 10/21/2019   Depression 06/19/2019   Acute right ankle pain 06/03/2019   Educated about COVID-19 virus  infection 06/03/2019   Nephrolithiasis 03/24/2018   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   Obesity, Class II, BMI 35-39.9 04/11/2017   Elevated liver enzymes 04/11/2017   Extrinsic asthma 08/15/2016   Solitary pulmonary nodule 07/03/2016   Essential hypertension, benign 04/04/2016   H/O varicella 08/19/2015   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   Deformity, finger, 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   Bilateral thoracic back pain 04/21/2014   Encounter for preventive health examination 04/21/2014   Major depressive disorder, recurrent episode, moderate ([place]) 05/21/2013   Posterior right knee pain 03/26/2013   Personal history of traumatic brain injury    Eustachian tube dysfunction, bilateral 05/05/2012   Intractable chronic post-traumatic headache 02/14/2012    Salem Caster. Fairly IV, PT, DPT Physical Therapist- [facility]  01/30/2022, 5:11 PM  [facility] [address] [place], [address] Phone: [phone]   Fax:  [phone]  Name: Margaret Zuniga MRN: [id number] Date of Birth: 07/16/1981

## 2022-02-01 ENCOUNTER — Other Ambulatory Visit: Payer: Self-pay

## 2022-02-01 ENCOUNTER — Ambulatory Visit: Payer: Medicare Other

## 2022-02-01 DIAGNOSIS — R262 Difficulty in walking, not elsewhere classified: Secondary | ICD-10-CM

## 2022-02-01 DIAGNOSIS — M79672 Pain in left foot: Secondary | ICD-10-CM

## 2022-02-01 DIAGNOSIS — M79671 Pain in right foot: Secondary | ICD-10-CM

## 2022-02-01 NOTE — Therapy (Signed)
[facility] [facility] [address], [place], [address] Phone: [phone]   Fax:  [phone]  Physical Therapy Treatment  Patient Details  Name: Margaret Zuniga MRN: [id number] Date of Birth: Oct 01, 1981 Referring Provider (PT): Dr. Sherryle Lis   Encounter Date: 02/01/2022   PT End of Session - 02/01/22 1520     Visit Number 13    Number of Visits 16    Date for PT Re-Evaluation 02/18/22    Authorization Type [employer] Dual Compl/Caid    Authorization Time Period 12/01/21-02/18/22    Progress Note Due on Visit 20    PT Start Time 1505    PT Stop Time 1540    PT Time Calculation (min) 35 min    Activity Tolerance Patient tolerated treatment well;No increased pain    Behavior During Therapy [facility] for tasks assessed/performed             Past Medical History:  Diagnosis Date   Bilateral nephrolithiasis 03/24/2018   Essential hypertension, benign 04/04/2016   Extrinsic asthma 08/15/2016   Headache due to trauma    chronic, takes, NSAIDs , imipramine, muscle relaxers (failed [facility])   History of kidney stones    Hypertension    Major depressive disorder, recurrent episode, moderate ([place]) 05/21/2013   Obesity (BMI 30.0-34.9) 04/11/2017   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 Marry Guan    Past Surgical History:  Procedure Laterality Date   ELBOW SURGERY Right [place] LITHOTRIPSY Left 01/22/2020   Procedure: EXTRACORPOREAL SHOCK WAVE LITHOTRIPSY (ESWL);  Surgeon: Abbie Sons, MD;  Location: [facility] ORS;  Service: Urology;  Laterality: Left;   EYE SURGERY  1995   KNEE ARTHROSCOPY WITH LATERAL MENISECTOMY Right 03/04/2020   Procedure: KNEE ARTHROSCOPY WITH PARTIAL LATERAL MENISECTOMY;  Surgeon: Hessie Knows, MD;  Location: [facility] ORS;  Service:  Orthopedics;  Laterality: Right;   LEG SURGERY  1985   SHOULDER SURGERY     SUBACROMIAL DECOMPRESSION  2000   Right shoulder, [place]   TONSILLECTOMY  2001    There were no vitals filed for this visit.   Subjective Assessment - 02/01/22 1507     Subjective Pt denies any significant updates since prior visit, Still working on HEP and using CAM rocker ad lib.    Patient is accompained by: Family member    Pertinent History 41 y.o. female presents with the above complaint. History confirmed with patient.  She wears an [place] brace on this side.  This began to worsen over the last few weeks.  Most of it is on the back of the heel and on the bottom of the heel. Pt notes that she has been able to don and doff swedish knee cage brace on RLE and she has ordered a knee sleeve too. Pt also describes feeling pain in the back of her left knee as well.    Currently in Pain? Yes    Pain Score 8    central plantar heel surface            INTERVENTION THIS DATE: -standing Left calf stretch [id number]  -application of sensory TENS to left posterior heel/mid calf, pt adjusts to compfort -Left ankle DF 1x15 @ 4lb -manually resisted Left ankle PF 1x15 -Left ankle DF 1x15 @  5lb -manually resisted Left ankle PF 1x15 -Left ankle DF 1x15 @ 5lb -manually resisted Left ankle PF 1O10  Rock tape application 4 way split to plantar MLA and calcaneus, achilles mid calf      PT Education - 02/01/22 1512     Education provided Yes    Education Details use of TENS and rocktape for edema managment and pain    Person(s) Educated Patient    Methods Explanation    Comprehension Verbalized understanding;Returned demonstration              PT Short Term Goals - 12/28/21 1354       PT SHORT TERM GOAL #1   Title Patient will demonstrate understanding of home exercise plan.    Baseline 10/05/21: NT 11/30/21: Ongoing    Time 2    Period Weeks    Status On-going    Target Date 10/19/21                PT Long Term Goals - 12/28/21 1355       PT LONG TERM GOAL #1   Title Patient will have improved function and activity level as evidenced by an increase in FOTO score by 10 points or more.    Baseline 10/05/21: 49/67 12/21: 49/87 12/28/21: 38/87    Time 8    Period Weeks    Status On-going    Target Date 02/18/22      PT LONG TERM GOAL #2   Title Patient will demonstrate improved ankle control and gait mechanics by maintaining a neutral right ankle during initial contact to avoid increased heel contact resulting in increased heel pain.    Baseline 10/05/21: NT 11/20/22: Increased hyperextension on RLE 12/21/21: Reduced heel contact on LLE with addition of swedish knee cage on RLE    Time 8    Period Weeks    Status Achieved    Target Date 02/18/22                   Plan - 02/01/22 1521     Clinical Impression Statement Continued with stretching and strengthening of Left ankle. Symptoms unchanged from start to finish. Commenced TENS and Rock tape to foot and calf this date.    Personal Factors and Comorbidities Comorbidity 1;Time since onset of injury/illness/exacerbation;Past/Current Experience    Comorbidities TBI    Examination-Activity Limitations Locomotion Level    Examination-Participation Restrictions Other    Stability/Clinical Decision Making Evolving/Moderate complexity    Clinical Decision Making Low    Rehab Potential Fair    Clinical Impairments Affecting Rehab Potential (-)TBI with resulting prolonged nature of abnormal gait, (-) right ankle contracture , (+) Motivation, strong family support    PT Frequency 2x / week    PT Duration 8 weeks    PT Treatment/Interventions Joint Manipulations;Taping;Splinting;Manual techniques;Orthotic Fit/Training;Neuromuscular re-education;Therapeutic activities;Therapeutic exercise;Balance training;Gait training;Moist Heat;Traction;Aquatic Therapy;Cryotherapy;Dry needling;Passive range of motion    PT Next Visit Plan  Continue to monitor gait mechanics with swedish knee cage, pressure offset of lateral heel, continued with stretching and moderate level loading resistance of posterior/dorsal plantar lower leg and foot.    PT Home Exercise Plan Access Code: [email or web address]  URL: [email or web address]  Date: 10/05/2021  Prepared by: Bradly Chris    Exercises  Long Sitting Calf Stretch with Strap - 1 x daily - 7 x weekly - 1 sets - 5 reps    Consulted and Agree with Plan of Care Patient  Patient will benefit from skilled therapeutic intervention in order to improve the following deficits and impairments:  Pain, Decreased range of motion, Abnormal gait, Difficulty walking, Hypomobility  Visit Diagnosis: Difficulty in walking, not elsewhere classified  Pain in left foot  Pain in right foot     Problem List Patient Active Problem List   Diagnosis Date Noted   Antibiotic-induced yeast infection 12/13/2021   Sore throat 12/13/2021   Gastroesophageal reflux disease without esophagitis 12/13/2021   Sub-Achilles bursitis, left 09/07/2021   Chronic right flank pain 06/24/2021   Acute pain of left shoulder 10/19/2020   Abnormal gait 10/19/2020   Recurrent falls 10/19/2020   Chronic intractable headache 10/19/2020   Ganglion cyst 10/19/2020   Baker's cyst of knee, right 10/19/2020   Depo-Provera contraceptive status 02/11/2020   Hydronephrosis concurrent with and due to calculi of kidney and ureter 01/16/2020   Paralysis ([place]) 12/18/2019   Bilateral leg weakness 10/21/2019   Depression 06/19/2019   Acute right ankle pain 06/03/2019   Educated about COVID-19 virus infection 06/03/2019   Nephrolithiasis 03/24/2018   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   Obesity, Class II, BMI 35-39.9 04/11/2017   Elevated liver enzymes 04/11/2017   Extrinsic asthma 08/15/2016   Solitary pulmonary nodule 07/03/2016   Essential hypertension, benign 04/04/2016   H/O varicella 08/19/2015   Contracture of  wrist joint 05/20/2014   Deformity, finger, 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   Bilateral thoracic back pain 04/21/2014   Encounter for preventive health examination 04/21/2014   Major depressive disorder, recurrent episode, moderate ([place]) 05/21/2013   Posterior right knee pain 03/26/2013   Personal history of traumatic brain injury    Eustachian tube dysfunction, bilateral 05/05/2012   Intractable chronic post-traumatic headache 02/14/2012   3:35 PM, 02/01/22 Etta Grandchild, PT, DPT Physical Therapist - [place] [phone] (Office)   Moffat C, PT 02/01/2022, 3:24 PM  [facility] [address] [place], [address] Phone: [phone]   Fax:  [phone]  Name: Margaret Zuniga MRN: [id number] Date of Birth: [birth year]

## 2022-02-03 ENCOUNTER — Other Ambulatory Visit: Payer: Self-pay | Admitting: Internal Medicine

## 2022-02-07 ENCOUNTER — Ambulatory Visit: Payer: Medicare Other

## 2022-02-07 ENCOUNTER — Other Ambulatory Visit: Payer: Self-pay

## 2022-02-07 DIAGNOSIS — M79672 Pain in left foot: Secondary | ICD-10-CM

## 2022-02-07 DIAGNOSIS — R262 Difficulty in walking, not elsewhere classified: Secondary | ICD-10-CM

## 2022-02-07 DIAGNOSIS — M79671 Pain in right foot: Secondary | ICD-10-CM

## 2022-02-07 NOTE — Therapy (Signed)
[facility] [address], [place], [address] Phone: [phone]   Fax:  [phone]  Physical Therapy Treatment  Patient Details  Name: Margaret Zuniga MRN: [id number] Date of Birth: 1981/10/09 Referring Provider (PT): Dr. Sherryle Lis   Encounter Date: 02/07/2022   PT End of Session - 02/07/22 1346     Visit Number 14    Number of Visits 16    Date for PT Re-Evaluation 02/18/22    Authorization Type UHC Dual Compl/Caid    Authorization Time Period 12/01/21-02/18/22    Progress Note Due on Visit 20    PT Start Time 1331    PT Stop Time 1411    PT Time Calculation (min) 40 min    Equipment Utilized During Treatment Gait belt    Activity Tolerance Patient tolerated treatment well;No increased pain    Behavior During Therapy [facility] for tasks assessed/performed             Past Medical History:  Diagnosis Date   Bilateral nephrolithiasis 03/24/2018   Essential hypertension, benign 04/04/2016   Extrinsic asthma 08/15/2016   Headache due to trauma    chronic, takes, NSAIDs , imipramine, muscle relaxers (failed [facility])   History of kidney stones    Hypertension    Major depressive disorder, recurrent episode, moderate ([place]) 05/21/2013   Obesity (BMI 30.0-34.9) 04/11/2017   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 Marry Guan    Past Surgical History:  Procedure Laterality Date   ELBOW SURGERY Right [place] LITHOTRIPSY Left 01/22/2020   Procedure: EXTRACORPOREAL SHOCK WAVE LITHOTRIPSY (ESWL);  Surgeon: Abbie Sons, MD;  Location: [facility] ORS;  Service: Urology;  Laterality: Left;   EYE SURGERY  1995   KNEE ARTHROSCOPY WITH LATERAL MENISECTOMY Right 03/04/2020   Procedure: KNEE ARTHROSCOPY WITH PARTIAL LATERAL MENISECTOMY;  Surgeon: Hessie Knows, MD;  Location: [facility] ORS;  Service: Orthopedics;  Laterality: Right;   LEG SURGERY  1985   SHOULDER SURGERY     SUBACROMIAL DECOMPRESSION  2000   Right shoulder, [place]   TONSILLECTOMY  2001    There were no vitals filed for this visit.   Subjective Assessment - 02/07/22 1342     Subjective Pt doing well today, reports the rock tape application to achilles and hell was aggravating and removed after 24 hours. She endorses an improvement in pain after last session, bu ttodya her pain is insidiously worse now 8/10.    Pertinent History 41 y.o. female presents with the above complaint. History confirmed with patient.  She wears an [place] brace on this side.  This began to worsen over the last few weeks.  Most of it is on the back of the heel and on the bottom of the heel. Pt notes that she has been able to don and doff swedish knee cage brace on RLE and she has ordered a knee sleeve too. Pt also describes feeling pain in the back of her left knee as well.    Currently in Pain? Yes    Pain Score 8    Left heel, plantar surface central            *TENS application *Moist heat over TENS x 4 minutes   INTERVENTION THIS DATE: -standing Left calf stretch  3x30sec   -Left ankle DF 1x15 @ 4lb -manually resisted Left ankle PF 1x15 -yellowTB loop resisted Left ankle eversion 1x15  -Left ankle DF 1x15 @ 4lb -manually resisted Left ankle PF 1x15 -yellowTB loop resisted Left ankle eversion 1x15  -Left ankle DF 1x15 @ 4lb -manually resisted Left ankle PF 1x15 -yellowTB loop resisted Left ankle eversion 1x15  -IASTM to lower calf/achilles complex and plantar heel surface x3 minutes,  -readjusted [place] brace felt cushio to create toilet seat off loading of central plantar heel. Instructed pt to remove if felt to be irritating painful areas of lateral heel.   Pain decrease form 8 to 6/10 at end of session    PT Education - 02/07/22 1346     Education provided Yes    Education  Details planning for mobility needs post procedure.    Person(s) Educated Patient    Methods Explanation    Comprehension Verbalized understanding              PT Short Term Goals - 12/28/21 1354       PT SHORT TERM GOAL #1   Title Patient will demonstrate understanding of home exercise plan.    Baseline 10/05/21: NT 11/30/21: Ongoing    Time 2    Period Weeks    Status On-going    Target Date 10/19/21               PT Long Term Goals - 12/28/21 1355       PT LONG TERM GOAL #1   Title Patient will have improved function and activity level as evidenced by an increase in FOTO score by 10 points or more.    Baseline 10/05/21: 49/67 12/21: 49/87 12/28/21: 38/87    Time 8    Period Weeks    Status On-going    Target Date 02/18/22      PT LONG TERM GOAL #2   Title Patient will demonstrate improved ankle control and gait mechanics by maintaining a neutral right ankle during initial contact to avoid increased heel contact resulting in increased heel pain.    Baseline 10/05/21: NT 11/20/22: Increased hyperextension on RLE 12/21/21: Reduced heel contact on LLE with addition of swedish knee cage on RLE    Time 8    Period Weeks    Status Achieved    Target Date 02/18/22                   Plan - 02/07/22 1347     Clinical Impression Statement Continued with moderate loading of calf, tib anterior, and fibularis longus/brevis, reps of 15. Calf clearly progressing in strength since starting a couple weeks back. DC use of Rock tape due to aggravation of pain. Recommended pt try cold water immersion and or contrast baths at home to help with pain modulation. Author assists with stretching for sake of time given use of AFO and equipment. Will repeat FOTO surery next visit hich is last visit prior to surgery in April.     Personal Factors and Comorbidities Comorbidity 1;Time since onset of injury/illness/exacerbation;Past/Current Experience    Comorbidities TBI     Examination-Activity Limitations Locomotion Level    Examination-Participation Restrictions Other    Stability/Clinical Decision Making Evolving/Moderate complexity    Clinical Decision Making Low    Rehab Potential Fair    Clinical Impairments Affecting Rehab Potential (-)TBI with resulting prolonged nature of abnormal gait, (-) right ankle contracture , (+) Motivation, strong family support    PT  Frequency 2x / week    PT Duration 8 weeks    PT Treatment/Interventions Joint Manipulations;Taping;Splinting;Manual techniques;Orthotic Fit/Training;Neuromuscular re-education;Therapeutic activities;Therapeutic exercise;Balance training;Gait training;Moist Heat;Traction;Aquatic Therapy;Cryotherapy;Dry needling;Passive range of motion    PT Next Visit Plan Continue to monitor gait mechanics with swedish knee cage, pressure offset of lateral heel, continued with stretching and moderate level loading resistance of posterior/dorsal plantar lower leg and foot.    PT Home Exercise Plan Access Code: [email or web address]  URL: [email or web address]  Date: 10/05/2021  Prepared by: Bradly Chris    Exercises  Long Sitting Calf Stretch with Strap - 1 x daily - 7 x weekly - 1 sets - 5 reps    Consulted and Agree with Plan of Care Patient             Patient will benefit from skilled therapeutic intervention in order to improve the following deficits and impairments:  Pain, Decreased range of motion, Abnormal gait, Difficulty walking, Hypomobility  Visit Diagnosis: Difficulty in walking, not elsewhere classified  Pain in left foot  Pain in right foot     Problem List Patient Active Problem List   Diagnosis Date Noted   Antibiotic-induced yeast infection 12/13/2021   Sore throat 12/13/2021   Gastroesophageal reflux disease without esophagitis 12/13/2021   Sub-Achilles bursitis, left 09/07/2021   Chronic right flank pain 06/24/2021   Acute pain of left shoulder 10/19/2020   Abnormal gait  10/19/2020   Recurrent falls 10/19/2020   Chronic intractable headache 10/19/2020   Ganglion cyst 10/19/2020   Baker's cyst of knee, right 10/19/2020   Depo-Provera contraceptive status 02/11/2020   Hydronephrosis concurrent with and due to calculi of kidney and ureter 01/16/2020   Paralysis ([place]) 12/18/2019   Bilateral leg weakness 10/21/2019   Depression 06/19/2019   Acute right ankle pain 06/03/2019   Educated about COVID-19 virus infection 06/03/2019   Nephrolithiasis 03/24/2018   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   Obesity, Class II, BMI 35-39.9 04/11/2017   Elevated liver enzymes 04/11/2017   Extrinsic asthma 08/15/2016   Solitary pulmonary nodule 07/03/2016   Essential hypertension, benign 04/04/2016   H/O varicella 08/19/2015   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   Deformity, finger, 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   Bilateral thoracic back pain 04/21/2014   Encounter for preventive health examination 04/21/2014   Major depressive disorder, recurrent episode, moderate ([place]) 05/21/2013   Posterior right knee pain 03/26/2013   Personal history of traumatic brain injury    Eustachian tube dysfunction, bilateral 05/05/2012   Intractable chronic post-traumatic headache 02/14/2012   2:24 PM, 02/07/22 Etta Grandchild, PT, DPT Physical Therapist - [place] [phone] (Office)   [place] C, PT 02/07/2022, 1:48 PM  [facility] [address], [place], [address] Phone: [phone]   Fax:  [phone]  Name: CAELIN ROSEN MRN: [id number] Date of Birth: 1981-01-23

## 2022-02-09 ENCOUNTER — Ambulatory Visit: Payer: Medicare Other | Attending: Podiatry

## 2022-02-09 ENCOUNTER — Other Ambulatory Visit: Payer: Self-pay

## 2022-02-09 DIAGNOSIS — R262 Difficulty in walking, not elsewhere classified: Secondary | ICD-10-CM

## 2022-02-09 DIAGNOSIS — M79672 Pain in left foot: Secondary | ICD-10-CM

## 2022-02-09 DIAGNOSIS — M79671 Pain in right foot: Secondary | ICD-10-CM | POA: Diagnosis not present

## 2022-02-09 NOTE — Therapy (Signed)
[facility] [address] [place], [address] Phone: [phone]   Fax:  [phone]  Physical Therapy Treatment/DC  Patient Details  Name: Margaret Zuniga MRN: [id number] Date of Birth: Apr 13, 1981 Referring Provider (PT): Dr. Sherryle Lis   Encounter Date: 02/09/2022   PT End of Session - 02/09/22 1432     Visit Number 15    Number of Visits 16    Date for PT Re-Evaluation 02/18/22    Authorization Type UHC Dual Compl/Caid    Authorization Time Period 12/01/21-02/18/22    Progress Note Due on Visit 20    PT Start Time 1420    PT Stop Time 1500    PT Time Calculation (min) 40 min    Equipment Utilized During Treatment Gait belt    Activity Tolerance Patient tolerated treatment well;No increased pain    Behavior During Therapy [facility] for tasks assessed/performed             Past Medical History:  Diagnosis Date   Bilateral nephrolithiasis 03/24/2018   Essential hypertension, benign 04/04/2016   Extrinsic asthma 08/15/2016   Headache due to trauma    chronic, takes, NSAIDs , imipramine, muscle relaxers (failed [facility])   History of kidney stones    Hypertension    Major depressive disorder, recurrent episode, moderate ([place]) 05/21/2013   Obesity (BMI 30.0-34.9) 04/11/2017   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 Marry Guan    Past Surgical History:  Procedure Laterality Date   ELBOW SURGERY Right [place] LITHOTRIPSY Left 01/22/2020   Procedure: EXTRACORPOREAL SHOCK WAVE LITHOTRIPSY (ESWL);  Surgeon: Abbie Sons, MD;  Location: [facility] ORS;  Service: Urology;  Laterality: Left;   EYE SURGERY  1995   KNEE ARTHROSCOPY WITH LATERAL MENISECTOMY Right 03/04/2020   Procedure: KNEE ARTHROSCOPY WITH PARTIAL LATERAL MENISECTOMY;  Surgeon:  Hessie Knows, MD;  Location: [facility] ORS;  Service: Orthopedics;  Laterality: Right;   LEG SURGERY  1985   SHOULDER SURGERY     SUBACROMIAL DECOMPRESSION  2000   Right shoulder, [place]   TONSILLECTOMY  2001    There were no vitals filed for this visit.   Subjective Assessment - 02/09/22 1424     Subjective Pt doing fine today. She reports she had some mild pain relief upon leaving last sesison, but pain returned to fairly painful levels about 4 hours later. Pt is ready for DC after today final visit. She is scheduled for surgery with podiatry in a few weeks. Pt reports new felt donut in orthotic was effective at pressure relief, did not cause any new pain issues.    Pertinent History 41 y.o. female presents with the above complaint. History confirmed with patient.  She wears an [place] brace on this side.  This began to worsen over the last few weeks.  Most of it is on the back of the heel and on the bottom of the heel. Pt notes that she has been able to don and doff swedish knee cage brace on RLE and she has ordered a knee sleeve too. Pt also describes feeling pain in the back of her left knee as well.    Currently in Pain? Yes    Pain Score 8     Pain Location --  Left heel, central plantar surface               PT Education - 02/09/22 1431     Education provided Yes    Education Details reminded about contrast baths at home and/or cool water immersion.    Person(s) Educated Patient    Methods Explanation    Comprehension Verbalized understanding              PT Short Term Goals - 02/09/22 1520       PT SHORT TERM GOAL #1   Title Patient will demonstrate understanding of home exercise plan.    Baseline 10/05/21: NT 11/30/21: Ongoing    Time 2    Period Weeks    Status On-going    Target Date 10/19/21               PT Long Term Goals - 02/09/22 1520       PT LONG TERM GOAL #1   Title Patient will have improved function and activity level as evidenced by  an increase in FOTO score by 10 points or more.    Baseline 10/05/21: 49/67 12/21: 49/87 12/28/21: 38/87; 02/09/22:31    Time 8    Period Weeks    Status On-going    Target Date 02/18/22      PT LONG TERM GOAL #2   Title Patient will demonstrate improved ankle control and gait mechanics by maintaining a neutral right ankle during initial contact to avoid increased heel contact resulting in increased heel pain.    Baseline 10/05/21: NT 11/20/22: Increased hyperextension on RLE 12/21/21: Reduced heel contact on LLE with addition of swedish knee cage on RLE    Time 8    Period Weeks    Status Achieved    Target Date 02/18/22      PT LONG TERM GOAL #3   Title Pt will decrease worst pain as reported on NPRS by at least 3 points in order to demonstrate clinically significant reduction in pain.    Baseline 09/25/18: 4/10 occassionally that subsides quickly    Time 8    Period Weeks    Status Achieved             *TENS application *Moist heat over TENS x 4 minutes    INTERVENTION THIS DATE: -standing Left calf stretch 3x30sec    -Left ankle DF 1x15 @ 5lb -manually resisted Left ankle PF 1x15 -yellowTB loop resisted Left ankle eversion 1x15   -Left ankle DF 1x15 @ 5lb -manually resisted Left ankle PF 1x15 -yellowTB loop resisted Left ankle eversion 1x15   -Left ankle DF 1x15 @ 5lb -manually resisted Left ankle PF 1x15 -yellowTB loop resisted Left ankle eversion 1x15   -Left ankle DF 1x15 @ 5lb -manually resisted Left ankle PF 1x15 -yellowTB loop resisted Left ankle eversion 1x15      Plan - 02/09/22 1433     Clinical Impression Statement Certification period over and last session completed. Unfortunately the patient has gotten no real pain relief over the course of therapy, nor has she seen any improved activity tolerance. Pt is making improvements in Left ankle strength and activation, but creating with-session improvements in symptoms has been very much limited. Pt to be  DC at this time with further treatment options pending with podiatry.    Personal Factors and Comorbidities Comorbidity 1;Time since onset of injury/illness/exacerbation;Past/Current Experience    Comorbidities TBI    Examination-Activity Limitations Locomotion Level    Examination-Participation Restrictions  Other    Stability/Clinical Decision Making Evolving/Moderate complexity    Clinical Decision Making Low    Rehab Potential Fair    Clinical Impairments Affecting Rehab Potential (-)TBI with resulting prolonged nature of abnormal gait, (-) right ankle contracture , (+) Motivation, strong family support    PT Frequency 2x / week    PT Duration 8 weeks    PT Treatment/Interventions Joint Manipulations;Taping;Splinting;Manual techniques;Orthotic Fit/Training;Neuromuscular re-education;Therapeutic activities;Therapeutic exercise;Balance training;Gait training;Moist Heat;Traction;Aquatic Therapy;Cryotherapy;Dry needling;Passive range of motion    PT Next Visit Plan Continue to monitor gait mechanics with swedish knee cage, pressure offset of lateral heel, continued with stretching and moderate level loading resistance of posterior/dorsal plantar lower leg and foot.    PT Home Exercise Plan Access Code: [email or web address]  URL: [email or web address]  Date: 10/05/2021  Prepared by: Bradly Chris    Exercises  Long Sitting Calf Stretch with Strap - 1 x daily - 7 x weekly - 1 sets - 5 reps    Consulted and Agree with Plan of Care Patient             Patient will benefit from skilled therapeutic intervention in order to improve the following deficits and impairments:  Pain, Decreased range of motion, Abnormal gait, Difficulty walking, Hypomobility  Visit Diagnosis: Difficulty in walking, not elsewhere classified  Pain in left foot  Pain in right foot     Problem List Patient Active Problem List   Diagnosis Date Noted   Antibiotic-induced yeast infection 12/13/2021   Sore throat  12/13/2021   Gastroesophageal reflux disease without esophagitis 12/13/2021   Sub-Achilles bursitis, left 09/07/2021   Chronic right flank pain 06/24/2021   Acute pain of left shoulder 10/19/2020   Abnormal gait 10/19/2020   Recurrent falls 10/19/2020   Chronic intractable headache 10/19/2020   Ganglion cyst 10/19/2020   Baker's cyst of knee, right 10/19/2020   Depo-Provera contraceptive status 02/11/2020   Hydronephrosis concurrent with and due to calculi of kidney and ureter 01/16/2020   Paralysis ([place]) 12/18/2019   Bilateral leg weakness 10/21/2019   Depression 06/19/2019   Acute right ankle pain 06/03/2019   Educated about COVID-19 virus infection 06/03/2019   Nephrolithiasis 03/24/2018   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   Obesity, Class II, BMI 35-39.9 04/11/2017   Elevated liver enzymes 04/11/2017   Extrinsic asthma 08/15/2016   Solitary pulmonary nodule 07/03/2016   Essential hypertension, benign 04/04/2016   H/O varicella 08/19/2015   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   Deformity, finger, 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   Bilateral thoracic back pain 04/21/2014   Encounter for preventive health examination 04/21/2014   Major depressive disorder, recurrent episode, moderate ([place]) 05/21/2013   Posterior right knee pain 03/26/2013   Personal history of traumatic brain injury    Eustachian tube dysfunction, bilateral 05/05/2012   Intractable chronic post-traumatic headache 02/14/2012   3:27 PM, 02/09/22 Etta Grandchild, PT, DPT Physical Therapist - [place] [phone] (Office)   [place] C, PT 02/09/2022, 3:25 PM  [facility] [address] [place], [address] Phone: [phone]   Fax:  [phone]  Name: Margaret Zuniga MRN: [id number] Date of Birth: February 12, 1981

## 2022-02-12 ENCOUNTER — Other Ambulatory Visit: Payer: Self-pay | Admitting: Internal Medicine

## 2022-02-22 ENCOUNTER — Telehealth: Payer: Self-pay | Admitting: Urology

## 2022-02-22 NOTE — Telephone Encounter (Signed)
DOS - 03/24/22 ? ?TENOLYSIS X'S 2 LEFT --- 65537 ?GASTROCNEMIUS RECESS LEFT --- 48270 ?FASCIECTOMY LEFT --- 28060 ? ?UHC EFFECTIVE DATE - 12/11/21 ? ?PLAN DEDUCTIBLE - $0.00 ?OUT OF POCKET - $8,300.00 W/ $8,102.16 REMAINING ?COINSURANCE - 20% ?COPAY - $0.00 ? ?PER [place] [address], [phone] AND [address] Notification or Prior Authorization is not required for the requested services ? ?Decision ID #:[id number] ?

## 2022-02-28 DIAGNOSIS — M542 Cervicalgia: Secondary | ICD-10-CM | POA: Diagnosis not present

## 2022-02-28 DIAGNOSIS — G518 Other disorders of facial nerve: Secondary | ICD-10-CM | POA: Diagnosis not present

## 2022-02-28 DIAGNOSIS — M791 Myalgia, unspecified site: Secondary | ICD-10-CM | POA: Diagnosis not present

## 2022-02-28 DIAGNOSIS — G43719 Chronic migraine without aura, intractable, without status migrainosus: Secondary | ICD-10-CM | POA: Diagnosis not present

## 2022-03-09 NOTE — Progress Notes (Signed)
Date last pap: 11/05/2018. ?Last Depo-Provera: 12/16/21. ?Side Effects if any: NO. ?Serum HCG indicated? NA. ?Depo-Provera 150 mg IM given by: Douglass Rivers, CMA ?Next appointment due 05/26/22-06/09/22 ?

## 2022-03-10 ENCOUNTER — Ambulatory Visit (INDEPENDENT_AMBULATORY_CARE_PROVIDER_SITE_OTHER): Payer: Medicare Other | Admitting: Obstetrics and Gynecology

## 2022-03-10 ENCOUNTER — Ambulatory Visit: Payer: Commercial Managed Care - HMO

## 2022-03-10 DIAGNOSIS — Z3042 Encounter for surveillance of injectable contraceptive: Secondary | ICD-10-CM | POA: Diagnosis not present

## 2022-03-10 LAB — HM HEPATITIS C SCREENING LAB: HM Hepatitis Screen: NEGATIVE

## 2022-03-10 MED ORDER — MEDROXYPROGESTERONE ACETATE 150 MG/ML IM SUSP
150.0000 mg | Freq: Once | INTRAMUSCULAR | Status: AC
  Administered 2022-03-10: 150 mg via INTRAMUSCULAR

## 2022-03-24 ENCOUNTER — Encounter: Payer: Self-pay | Admitting: Podiatry

## 2022-03-24 ENCOUNTER — Other Ambulatory Visit: Payer: Self-pay | Admitting: Podiatry

## 2022-03-24 DIAGNOSIS — M7662 Achilles tendinitis, left leg: Secondary | ICD-10-CM | POA: Diagnosis not present

## 2022-03-24 DIAGNOSIS — M722 Plantar fascial fibromatosis: Secondary | ICD-10-CM | POA: Diagnosis not present

## 2022-03-24 DIAGNOSIS — M205X2 Other deformities of toe(s) (acquired), left foot: Secondary | ICD-10-CM | POA: Diagnosis not present

## 2022-03-24 DIAGNOSIS — M7672 Peroneal tendinitis, left leg: Secondary | ICD-10-CM | POA: Diagnosis not present

## 2022-03-24 DIAGNOSIS — M216X2 Other acquired deformities of left foot: Secondary | ICD-10-CM | POA: Diagnosis not present

## 2022-03-24 MED ORDER — OXYCODONE HCL 5 MG PO TABS: 5.0000 mg | ORAL_TABLET | ORAL | 0 refills | Status: AC | PRN

## 2022-03-24 MED ORDER — ACETAMINOPHEN 500 MG PO TABS: 1000.0000 mg | ORAL_TABLET | Freq: Four times a day (QID) | ORAL | 0 refills | Status: AC | PRN

## 2022-03-24 MED ORDER — GABAPENTIN 300 MG PO CAPS: 300.0000 mg | ORAL_CAPSULE | Freq: Three times a day (TID) | ORAL | 0 refills | Status: DC

## 2022-03-24 NOTE — Progress Notes (Signed)
4/14 L gastroc recession and topaz procedure ?

## 2022-03-28 ENCOUNTER — Other Ambulatory Visit: Payer: Self-pay

## 2022-03-28 DIAGNOSIS — K219 Gastro-esophageal reflux disease without esophagitis: Secondary | ICD-10-CM

## 2022-03-28 MED ORDER — OMEPRAZOLE 20 MG PO CPDR: 20.0000 mg | DELAYED_RELEASE_CAPSULE | Freq: Two times a day (BID) | ORAL | 3 refills | Status: DC

## 2022-03-30 ENCOUNTER — Encounter: Payer: Medicare Other | Admitting: Podiatry

## 2022-03-30 ENCOUNTER — Ambulatory Visit (INDEPENDENT_AMBULATORY_CARE_PROVIDER_SITE_OTHER): Payer: Medicare Other | Admitting: Podiatry

## 2022-03-30 DIAGNOSIS — Z9889 Other specified postprocedural states: Secondary | ICD-10-CM

## 2022-03-30 DIAGNOSIS — M216X2 Other acquired deformities of left foot: Secondary | ICD-10-CM

## 2022-03-30 DIAGNOSIS — M722 Plantar fascial fibromatosis: Secondary | ICD-10-CM

## 2022-03-30 DIAGNOSIS — M7662 Achilles tendinitis, left leg: Secondary | ICD-10-CM

## 2022-03-30 NOTE — Progress Notes (Signed)
?Subjective:  ?Patient ID: Margaret Zuniga, female    DOB: 08-Nov-1981,  MRN: [id number] ? ?Chief Complaint  ?Patient presents with  ? Routine Post Op  ?   POV #1 DOS 03/24/22 --- LENGTHENING OF CALF MUSCLE, TOPAZ PROCEDUREACHILLES, PLANTAR FASCIA AND PERONEAL TENDONS, PRP INJECTION ---- [facility] PT  ? ? ?DOS: 03/24/2022 ?Procedure: Lengthening of the calf muscle and Topaz procedure ? ?41 y.o. female returns for post-op check.  Patient states that she is doing well.  Minimal pain.  She denies any other acute complaint bandages clean dry and intact. ? ?Review of Systems: Negative except as noted in the HPI. Denies N/V/F/Ch. ? ?Past Medical History:  ?Diagnosis Date  ? Bilateral nephrolithiasis 03/24/2018  ? Essential hypertension, benign 04/04/2016  ? Extrinsic asthma 08/15/2016  ? 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 chronic, takes, NSAIDs , imipramine, muscle relaxers (failed [facility])  ? History of kidney stones   ? Hypertension   ? Major depressive disorder, recurrent episode, moderate ([place]) 05/21/2013  ? Obesity (BMI 30.0-34.9) 04/11/2017  ? 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 Personal history of traumatic brain injury 61  ? Shoulder impingement 2009  ? surgical relesase, Dr. Marry Guan  ? ? ?Current Outpatient Medications:  ?  acetaminophen (TYLENOL) 500 MG tablet, Take 2 tablets (1,000 mg total) by mouth every 6 (six) hours as needed for up to 14 days (pain)., Disp: 112 tablet, Rfl: 0 ?  Acetaminophen-Caffeine 500-65 MG TABS, Take 1 tablet by mouth in the morning, at noon, and at bedtime. , Disp: , Rfl:  ?  albuterol (VENTOLIN HFA) 108 (90 Base) MCG/ACT inhaler, Inhale 2 puffs into the lungs every 6 (six) hours as needed for wheezing or shortness of breath., Disp: , Rfl:  ?  amLODipine (NORVASC) 10 MG tablet, TAKE 1 TABLET BY MOUTH EVERY DAY, Disp: 90 tablet, Rfl: 1 ?  Ascorbic Acid (VITAMIN C) 500 MG CAPS, Take 1 tablet by mouth., Disp: , Rfl:  ?  Atogepant (QULIPTA) 60 MG  TABS, , Disp: , Rfl:  ?  baclofen (LIORESAL) 10 MG tablet, Take 10 mg by mouth 2 (two) times daily as needed., Disp: , Rfl:  ?  carbamazepine (CARBATROL) 300 MG 12 hr capsule, Take 300 mg by mouth in the morning, at noon, and at bedtime. , Disp: , Rfl: 1 ?  cetirizine (ZYRTEC) 10 MG tablet, Take 10 mg by mouth daily., Disp: , Rfl:  ?  Cholecalciferol (VITAMIN D3) 25 MCG (1000 UT) CAPS, Take 1 capsule by mouth., Disp: , Rfl:  ?  ciprofloxacin (CIPRO) 500 MG tablet, Take 1 tablet (500 mg total) by mouth 2 (two) times daily., Disp: 10 tablet, Rfl: 0 ?  cyclobenzaprine (FLEXERIL) 5 MG tablet, Take 1 tablet (5 mg total) by mouth 3 (three) times daily as needed for muscle spasms., Disp: 30 tablet, Rfl: 1 ?  diphenhydrAMINE HCl, Sleep, (ZZZQUIL PO), Take by mouth at bedtime., Disp: , Rfl:  ?  escitalopram (LEXAPRO) 10 MG tablet, TAKE 1 TABLET BY MOUTH EVERY DAY, Disp: 90 tablet, Rfl: 0 ?  fluconazole (DIFLUCAN) 150 MG tablet, Take 1 tablet (150 mg total) by mouth as directed. Take one tablet by mouth on day 1. May repeat dose of one tablet by mouth on day four., Disp: 2 tablet, Rfl: 0 ?  fluticasone (FLONASE) 50 MCG/ACT nasal spray, Place 2 sprays into both nostrils daily., Disp: 16 g, Rfl: 6 ?  fluticasone furoate-vilanterol (BREO ELLIPTA) 100-25 MCG/INH AEPB, Inhale into the lungs., Disp: , Rfl:  ?  gabapentin (NEURONTIN) 300 MG capsule, Take 1 capsule (300 mg total) by mouth 3 (three) times daily for 7 days., Disp: 21 capsule, Rfl: 0 ?  Lactulose 20 GM/30ML SOLN, 30 ml every 4 hours until constipation is relieved, Disp: 236 mL, Rfl: 3 ?  MAGNESIUM PO, Take 500 mg by mouth., Disp: , Rfl:  ?  medroxyPROGESTERone (DEPO-PROVERA) 150 MG/ML injection, INJECT 1 ML (150 MG TOTAL) INTO THE MUSCLE EVERY 3 (THREE) MONTHS., Disp: 1 mL, Rfl: 3 ?  metoprolol succinate (TOPROL-XL) 100 MG 24 hr tablet, TAKE 1 TABLET BY MOUTH EVERY DAY WITH OR IMMEDIATELY FOLLOWING A MEAL, Disp: 90 tablet, Rfl: 0 ?  Multiple Vitamins-Minerals (CENTRUM  WOMEN PO), Take by mouth daily in the afternoon., Disp: , Rfl:  ?  omeprazole (PRILOSEC) 20 MG capsule, Take 1 capsule (20 mg total) by mouth 2 (two) times daily before a meal., Disp: 180 capsule, Rfl: 3 ?  oxyCODONE (OXY IR/ROXICODONE) 5 MG immediate release tablet, Take 1 tablet (5 mg total) by mouth every 4 (four) hours as needed for up to 7 days for severe pain., Disp: 20 tablet, Rfl: 0 ? ?Social History  ? ?Tobacco Use  ?Smoking Status Never  ?Smokeless Tobacco Never  ? ? ?Allergies  ?Allergen Reactions  ? Zonegran [Zonisamide] Rash  ? ?Objective:  ?There were no vitals filed for this visit. ?There is no height or weight on file to calculate BMI. ?Constitutional Well developed. ?Well nourished.  ?Vascular Foot warm and well perfused. ?Capillary refill normal to all digits.   ?Neurologic Normal speech. ?Oriented to person, place, and time. ?Epicritic sensation to light touch grossly present bilaterally.  ?Dermatologic Skin healing well without signs of infection. Skin edges well coapted without signs of infection.  ?Orthopedic: Tenderness to palpation noted about the surgical site.  ? ?Radiographs: None ?Assessment:  ? ?1. Acquired equinus deformity of left foot   ?2. Tendonitis, Achilles, left   ?3. Plantar fasciitis, left   ?4. Status post foot surgery   ? ?Plan:  ?Patient was evaluated and treated and all questions answered. ? ?S/p foot surgery left ?-Progressing as expected post-operatively. ?-XR: See above ?-WB Status: Weightbearing as tolerated in cam boot ?-Sutures: Intact.  No clinical signs of dehiscence.  No complication noted. ?-Medications: None ?-Foot redressed. ? ?No follow-ups on file.  ?

## 2022-04-11 DIAGNOSIS — M542 Cervicalgia: Secondary | ICD-10-CM | POA: Diagnosis not present

## 2022-04-11 DIAGNOSIS — G518 Other disorders of facial nerve: Secondary | ICD-10-CM | POA: Diagnosis not present

## 2022-04-11 DIAGNOSIS — G43719 Chronic migraine without aura, intractable, without status migrainosus: Secondary | ICD-10-CM | POA: Diagnosis not present

## 2022-04-11 DIAGNOSIS — M791 Myalgia, unspecified site: Secondary | ICD-10-CM | POA: Diagnosis not present

## 2022-04-12 ENCOUNTER — Ambulatory Visit (INDEPENDENT_AMBULATORY_CARE_PROVIDER_SITE_OTHER): Payer: Medicare Other | Admitting: Podiatry

## 2022-04-12 ENCOUNTER — Telehealth: Payer: Self-pay | Admitting: Podiatry

## 2022-04-12 DIAGNOSIS — M216X2 Other acquired deformities of left foot: Secondary | ICD-10-CM

## 2022-04-12 DIAGNOSIS — M7662 Achilles tendinitis, left leg: Secondary | ICD-10-CM

## 2022-04-12 DIAGNOSIS — M7672 Peroneal tendinitis, left leg: Secondary | ICD-10-CM

## 2022-04-12 NOTE — Patient Instructions (Signed)
Restart physical therapy ? ? ? ?Peroneal Tendinopathy Rehab ?Ask your health care provider which exercises are safe for you. Do exercises exactly as told by your health care provider and adjust them as directed. It is normal to feel mild stretching, pulling, tightness, or discomfort as you do these exercises. Stop right away if you feel sudden pain or your pain gets worse. Do not begin these exercises until told by your health care provider. ?Stretching and range-of-motion exercises ?These exercises warm up your muscles and joints and improve the movement and flexibility of your ankle. These exercises also help to relieve pain and stiffness. ?Gastroc and soleus stretch, standing ? ?This is an exercise in which you stand on a step and use your body weight to stretch your calf muscles. To do this exercise: ?Stand on the edge of a step on the ball of your left / right foot. The ball of your foot is on the walking surface, right under your toes. ?Keep your other foot firmly on the same step. ?Hold on to the wall, a railing, or a chair for balance. ?Slowly lift your other foot, allowing your body weight to press your left / right heel down over the edge of the step. You should feel a stretch in your left / right calf (gastrocnemius and soleus). ?Hold this position for 15 seconds. ?Return both feet to the step. ?Repeat this exercise with a slight bend in your left / right knee. ?Repeat 5 times with your left / right knee straight and 5 times with your left / right knee bent. Complete this exercise 2 times a day. ?Strengthening exercises ?These exercises build strength and endurance in your foot and ankle. Endurance is the ability to use your muscles for a long time, even after they get tired. ?Ankle dorsiflexion with band ? ? ?Secure a rubber exercise band or tube to an object, such as a table leg, that will not move when the band is pulled. ?Secure the other end of the band around your left / right foot. ?Sit on the  floor, facing the object with your left / right leg extended. The band or tube should be slightly tense when your foot is relaxed. ?Slowly flex your left / right ankle and toes to bring your foot toward you (dorsiflexion). ?Hold this position for 15 seconds. ?Let the band or tube slowly pull your foot back to the starting position. ?Repeat 5 times. Complete this exercise 2 times a day. ?Ankle eversion ?Sit on the floor with your legs straight out in front of you. ?Loop a rubber exercise band or tube around the ball of your left / right foot. The ball of your foot is on the walking surface, right under your toes. ?Hold the ends of the band in your hands, or secure the band to a stable object. The band or tube should be slightly tense when your foot is relaxed. ?Slowly push your foot outward, away from your other leg (eversion). ?Hold this position for 15 seconds. ?Slowly return your foot to the starting position. ?Repeat 5 times. Complete this exercise 2 times a day. ?Plantar flexion, standing ? ?This exercise is sometimes called standing heel raise. ?Stand with your feet shoulder-width apart. ?Place your hands on a wall or table to steady yourself as needed, but try not to use it for support. ?Keep your weight spread evenly over the width of your feet while you slowly rise up on your toes (plantar flexion). If told by your health care  provider: ?Shift your weight toward your left / right leg until you feel challenged. ?Stand on your left / right leg only. ?Hold this position for 15 seconds. ?Repeat 2 times. Complete this exercise 2 times a day. ?Single leg stand ?Without shoes, stand near a railing or in a doorway. You may hold on to the railing or door frame as needed. ?Stand on your left / right foot. Keep your big toe down on the floor and try to keep your arch lifted. ?Do not roll to the outside of your foot. ?If this exercise is too easy, you can try it with your eyes closed or while standing on a pillow. ?Hold  this position for 15 seconds. ?Repeat 5 times. Complete this exercise 2 times a day. ?This information is not intended to replace advice given to you by your health care provider. Make sure you discuss any questions you have with your health care provider. ?Document Revised: 03/18/2019 Document Reviewed: 03/18/2019 ?Elsevier Patient Education ? [place]. ? ?

## 2022-04-12 NOTE — Telephone Encounter (Signed)
The pt PT [facility] [employer] called saying the pt only had a verbal order today but needs a written order for pt to be seen for PT ? ?Fax # [phone] ?Phone # [phone] ?

## 2022-04-12 NOTE — Telephone Encounter (Signed)
I can place an order. Which location is this?

## 2022-04-12 NOTE — Telephone Encounter (Signed)
Did not get the correct # for Margaret Zuniga. Where was the call coming from?

## 2022-04-12 NOTE — Progress Notes (Signed)
?  Subjective:  ?Patient ID: Margaret Zuniga, female    DOB: Jul 16, 1981,  MRN: [id number] ? ?Chief Complaint  ?Patient presents with  ? Routine Post Op  ?   POV #2 DOS 03/24/22 --- LENGTHENING OF CALF MUSCLE, TOPAZ PROCEDUREACHILLES, PLANTAR FASCIA AND PERONEAL TENDONS, PRP INJECTION   ? ? ?41 y.o. female returns for post-op check.  Overall doing well some Achilles tenderness still ? ?Review of Systems: Negative except as noted in the HPI. Denies N/V/F/Ch. ? ? ?Objective:  ?There were no vitals filed for this visit. ?There is no height or weight on file to calculate BMI. ?Constitutional Well developed. ?Well nourished.  ?Vascular Foot warm and well perfused. ?Capillary refill normal to all digits.  Calf is soft and supple, no posterior calf or knee pain, negative Homans' sign  ?Neurologic Normal speech. ?Oriented to person, place, and time. ?Epicritic sensation to light touch grossly present bilaterally.  ?Dermatologic Skin healing well without signs of infection. Skin edges well coapted without signs of infection.  ?Orthopedic: Tenderness to palpation noted about the surgical site.  ? ?Assessment:  ? ?1. Tendonitis, Achilles, left   ?2. Acquired equinus deformity of left foot   ?3. Peroneal tendinitis of left lower extremity   ? ?Plan:  ?Patient was evaluated and treated and all questions answered. ? ?Doing well I removed her sutures uneventfully today.  She should continue WBAT and sleeping in the CAM boot and I recommend she begin physical therapy again and a renewal order was sent for this.  I will see her back in 3 weeks hopefully transition back to regular shoes at that point ?Return in about 3 weeks (around 05/03/2022).  ? ?

## 2022-04-12 NOTE — Telephone Encounter (Signed)
Correction* ?Bonnie# ?[phone] ?

## 2022-04-12 NOTE — Telephone Encounter (Signed)
Called and they did not see the referral in system but she has it now. ?

## 2022-04-13 ENCOUNTER — Encounter: Payer: Medicare Other | Admitting: Podiatry

## 2022-04-18 ENCOUNTER — Ambulatory Visit: Payer: Medicare Other | Attending: Podiatry

## 2022-04-18 ENCOUNTER — Ambulatory Visit: Payer: Medicare Other | Admitting: Physical Therapy

## 2022-04-18 DIAGNOSIS — M7672 Peroneal tendinitis, left leg: Secondary | ICD-10-CM | POA: Insufficient documentation

## 2022-04-18 DIAGNOSIS — R262 Difficulty in walking, not elsewhere classified: Secondary | ICD-10-CM | POA: Insufficient documentation

## 2022-04-18 DIAGNOSIS — M79672 Pain in left foot: Secondary | ICD-10-CM | POA: Insufficient documentation

## 2022-04-18 DIAGNOSIS — M216X2 Other acquired deformities of left foot: Secondary | ICD-10-CM | POA: Insufficient documentation

## 2022-04-18 DIAGNOSIS — M7662 Achilles tendinitis, left leg: Secondary | ICD-10-CM | POA: Insufficient documentation

## 2022-04-18 NOTE — Therapy (Signed)
[facility] ?[address] ?[place], [place], [address] ?Phone: [phone]   Fax:  [phone] ? ?Physical Therapy Evaluation ? ?Patient Details  ?Name: Margaret Zuniga ?MRN: [id number] ?Date of Birth: [birth year] ?Referring Provider (PT): Lanae Crumbly DPM ? ? ?Encounter Date: 04/18/2022 ? ? PT End of Session - 04/18/22 1039   ? ? Visit Number 1   ? Number of Visits 16   ? Date for PT Re-Evaluation 07/11/22   ? Authorization Type [employer] Dual Compl/Caid   ? Authorization Time Period 04/18/22-07/11/22   ? Progress Note Due on Visit 10   ? PT Start Time 0845   ? PT Stop Time 0925   ? PT Time Calculation (min) 40 min   ? Activity Tolerance Patient tolerated treatment well;No increased pain   ? Behavior During Therapy [facility] for tasks assessed/performed   ? ?  ?  ? ?  ? ? ?Past Medical History:  ?Diagnosis Date  ? Bilateral nephrolithiasis 03/24/2018  ? Essential hypertension, benign 04/04/2016  ? Extrinsic asthma 08/15/2016  ? 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 chronic, takes, NSAIDs , imipramine, muscle relaxers (failed [facility])  ? History of kidney stones   ? Hypertension   ? Major depressive disorder, recurrent episode, moderate ([place]) 05/21/2013  ? Obesity (BMI 30.0-34.9) 04/11/2017  ? 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 Personal history of traumatic brain injury 78  ? Shoulder impingement 2009  ? surgical relesase, Dr. Marry Guan  ? ? ?Past Surgical History:  ?Procedure Laterality Date  ? ELBOW SURGERY Right 1995  ? EXTRACORPOREAL SHOCK WAVE LITHOTRIPSY Left 01/22/2020  ? Procedure: EXTRACORPOREAL SHOCK WAVE LITHOTRIPSY (ESWL);  Surgeon: Abbie Sons, MD;  Location: [facility] ORS;  Service: Urology;  Laterality: Left;  ? EYE SURGERY  1995  ? KNEE ARTHROSCOPY WITH LATERAL MENISECTOMY Right 03/04/2020  ? Procedure: KNEE ARTHROSCOPY WITH PARTIAL LATERAL MENISECTOMY;  Surgeon: Hessie Knows, MD;  Location: [facility] ORS;  Service:  Orthopedics;  Laterality: Right;  ? LEG SURGERY  1985  ? SHOULDER SURGERY    ? SUBACROMIAL DECOMPRESSION  2000  ? Right shoulder, [place]  ? TONSILLECTOMY  2001  ? ? ?There were no vitals filed for this visit. ? ? ? Subjective Assessment - 04/18/22 0857   ? ? Subjective Pt reports procedure went well for Left achilles. She has had quite a bit of pain since procedure, but pt has transitioned back to only taking her migraine HA medication.   ? How long can you sit comfortably? unlimited   ? How long can you stand comfortably? 10 minutes   ? How long can you walk comfortably? half a mile   ? Patient Stated Goals return to ability to walk for recreation and exercise without pain limitaions   ? Currently in Pain? Yes   ? Pain Score 6    ? Pain Location --   upper achilles at surgical incision, and heel both on left  ? ?  ?  ? ?  ? ? ? ? ? [facility] PT Assessment - 04/18/22 0001   ? ?  ? Assessment  ? Medical Diagnosis s/p Left achilles lengthening, peroneal tendon repair, topaz procedure 03/24/22   ? Referring Provider (PT) Lanae Crumbly DPM   ? Onset Date/Surgical Date 03/24/22   ? Hand Dominance Left   ? Prior Therapy Prehab earlier this year   ?  ?  Precautions  ? Precautions Other (comment)   CAM ROCKER until released  ?  ? Restrictions  ? Weight Bearing Restrictions Yes   ? LLE Weight Bearing Weight bearing as tolerated   ?  ? Balance Screen  ? Has the patient fallen in the past 6 months No   ? How many times? 0   ? Has the patient had a decrease in activity level because of a fear of falling?  No   ? Is the patient reluctant to leave their home because of a fear of falling?  No   ?  ? Prior Function  ? Level of Independence Independent   ?  ? Observation/Other Assessments  ? Focus on Therapeutic Outcomes (FOTO)  21   ?  ? AROM  ? Right/Left Ankle Left   ? Left Ankle Dorsiflexion 9   4 degrees on 3/23 presurgical  ? Left Ankle Plantar Flexion 48   ?  ? PROM  ? PROM Assessment Site Ankle   ? Right/Left Ankle Left   ? Left  Ankle Dorsiflexion 27   0 degrees 3/23 presurgical  ? Left Ankle Plantar Flexion 50   ? Left Ankle Inversion 50   47 3/23 presurgical  ? Left Ankle Eversion 24   15 3/23 presurgical  ?  ? Ambulation/Gait  ? Ambulation Distance (Feet) 200 Feet   cut of after 26f (pt not in her CAM rocker as ordered)  ? Assistive device --   swedish knee cage  ? Gait velocity 0.767m   ? Gait Comments pain remains stable; pt does not have her [place] on this date to facilitate examination   ? ?  ?  ? ?  ? ? ?HEP Review and modification based on podiatry handout: ?-standing left calf stretch wall push ?-seated heel raises 1x20x1secH ?-seated ankle DF 1x20x1secH ? ? ? ?Objective measurements completed on examination: See above findings.  ? ? ? ? ? ? ? ? PT Education - 04/18/22 1041   ? ? Education provided Yes   ? Education Details importance of using CAM rocker as directed   ? Person(s) Educated Patient   ? Methods Explanation   ? Comprehension Verbalized understanding;Need further instruction   ? ?  ?  ? ?  ? ? ? PT Short Term Goals - 04/18/22 1053   ? ?  ? PT SHORT TERM GOAL #1  ? Title Patient will demonstrate understanding of home exercise plan reporting good compliance   ? Baseline issued at eval   ? Time 4   ? Period Weeks   ? Status New   ? Target Date 05/16/22   ?  ? PT SHORT TERM GOAL #2  ? Title Pt to improve AMB tolerance to >50048fithout exacerbation of pain.   ? Baseline eval: 200f38f? Time 4   ? Period Weeks   ? Status New   ? Target Date 05/16/22   ? ?  ?  ? ?  ? ? ? ? PT Long Term Goals - 04/18/22 1054   ? ?  ? PT LONG TERM GOAL #1  ? Title Patient will have improved function and activity level as evidenced by an increase in FOTO score by 10 points or more.   ? Baseline Eval: 47   ? Time 8   ? Period Weeks   ? Status New   ? Target Date 06/13/22   ?  ? PT LONG TERM GOAL #2  ? Title  Pt to demonstrate 5/5 strength in ankle DF, EV, IV left for improved motor control in gait.   ? Baseline eval: not tested   ? Time 8    ? Period Weeks   ? Status New   ? Target Date 06/13/22   ?  ? PT LONG TERM GOAL #3  ? Title Pt will decrease worst pain as reported on NPRS by at least 2 points in order to demonstrate clinically significant reduction in pain.   ? Baseline eval: 6/10   ? Time 8   ? Period Weeks   ? Status New   ? Target Date 06/13/22   ?  ? PT LONG TERM GOAL #4  ? Title Pt to demonstrate 6MWT >1222f without exacerbation in pain to improve ability to AMB in community.   ? Baseline eval: 2038fwalk test (100063fn 2019)   ? Time 12   ? Period Weeks   ? Status New   ? Target Date 07/11/22   ? ?  ?  ? ?  ? ? ? ? ? ? ? ? ? Plan - 04/18/22 1041   ? ? Clinical Impression Statement Pt returns for evaluation s/p foot ankle surgery to address chronic tendinosis of Achilles, peroneals, and plantarfasciitis. Pt has been mobilizing generally well without device. Pt has improved ROM in ankle since procedure, but frank weakness and pain. Pt will benefit from skilled PT to maximize strength andor control to improve safety and activity tolerance for return to PLOF.   ? Personal Factors and Comorbidities Comorbidity 1;Time since onset of injury/illness/exacerbation;Past/Current Experience   ? Comorbidities TBI   ? Examination-Activity Limitations Locomotion Level   ? Examination-Participation Restrictions Other   ? Stability/Clinical Decision Making Evolving/Moderate complexity   ? Clinical Decision Making Moderate   ? Rehab Potential Good   ? Clinical Impairments Affecting Rehab Potential (-)TBI with resulting prolonged nature of abnormal gait, (-) right ankle contracture , (+) Motivation, strong family support   ? PT Frequency 2x / week   ? PT Duration 12 weeks   ? PT Treatment/Interventions Joint Manipulations;Taping;Splinting;Manual techniques;Orthotic Fit/Training;Neuromuscular re-education;Therapeutic activities;Therapeutic exercise;Balance training;Gait training;Moist Heat;Traction;Aquatic Therapy;Cryotherapy;Dry needling;Passive range of  motion   ? PT Next Visit Plan review HEP from visit 1, add in isometric ankle eversion/inversion to HEP and program here.   ? PT Home Exercise Plan Access Code: [email or web address]  URL: [email or web address]

## 2022-04-18 NOTE — Progress Notes (Signed)
DOS 03/24/2022 ?Lengthening of calf muscle left, Topaz procedure Achilles left, plantar fascia and peroneal tendons, platelet rich plasma injection left ?

## 2022-04-20 ENCOUNTER — Encounter: Payer: Medicare Other | Admitting: Physical Therapy

## 2022-04-25 ENCOUNTER — Ambulatory Visit: Payer: Medicare Other | Admitting: Physical Therapy

## 2022-04-25 ENCOUNTER — Encounter: Payer: Self-pay | Admitting: Physical Therapy

## 2022-04-25 DIAGNOSIS — M216X2 Other acquired deformities of left foot: Secondary | ICD-10-CM | POA: Diagnosis not present

## 2022-04-25 DIAGNOSIS — R262 Difficulty in walking, not elsewhere classified: Secondary | ICD-10-CM | POA: Diagnosis not present

## 2022-04-25 DIAGNOSIS — M7662 Achilles tendinitis, left leg: Secondary | ICD-10-CM | POA: Diagnosis not present

## 2022-04-25 DIAGNOSIS — M7672 Peroneal tendinitis, left leg: Secondary | ICD-10-CM | POA: Diagnosis not present

## 2022-04-25 DIAGNOSIS — M79672 Pain in left foot: Secondary | ICD-10-CM | POA: Diagnosis not present

## 2022-04-25 NOTE — Therapy (Signed)
?OUTPATIENT PHYSICAL THERAPY TREATMENT NOTE ? ? ?Patient Name: Margaret Zuniga ?MRN: [id number] ?DOB:March 23, 1981, 41 y.o., female ?Today's Date: 04/25/2022 ? ?PCP: Dr. Derrel Nip  ?REFERRING PROVIDER: Dr. Sherryle Lis  ? ? PT End of Session - 04/25/22 1417   ? ? Visit Number 2   ? Number of Visits 16   ? Date for PT Re-Evaluation 07/11/22   ? Authorization Type UHC Dual Compl/Caid   ? Authorization Time Period 04/18/22-07/11/22   ? Progress Note Due on Visit 10   ? PT Start Time 1415   ? PT Stop Time 1500   ? PT Time Calculation (min) 45 min   ? Activity Tolerance Patient tolerated treatment well;No increased pain   ? Behavior During Therapy [facility] for tasks assessed/performed   ? ?  ?  ? ?  ? ? ?Past Medical History:  ?Diagnosis Date  ? Bilateral nephrolithiasis 03/24/2018  ? Essential hypertension, benign 04/04/2016  ? Extrinsic asthma 08/15/2016  ? 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 chronic, takes, NSAIDs , imipramine, muscle relaxers (failed [facility])  ? History of kidney stones   ? Hypertension   ? Major depressive disorder, recurrent episode, moderate ([place]) 05/21/2013  ? Obesity (BMI 30.0-34.9) 04/11/2017  ? 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 right sided due to head injury, chronic pain since age 27 from [place]  ? Personal history of traumatic brain injury 89  ? Shoulder impingement 2009  ? surgical relesase, Dr. Marry Guan  ? ?Past Surgical History:  ?Procedure Laterality Date  ? ELBOW SURGERY Right 1995  ? EXTRACORPOREAL SHOCK WAVE LITHOTRIPSY Left 01/22/2020  ? Procedure: EXTRACORPOREAL SHOCK WAVE LITHOTRIPSY (ESWL);  Surgeon: Abbie Sons, MD;  Location: [facility] ORS;  Service: Urology;  Laterality: Left;  ? EYE SURGERY  1995  ? KNEE ARTHROSCOPY WITH LATERAL MENISECTOMY Right 03/04/2020  ? Procedure: KNEE ARTHROSCOPY WITH PARTIAL LATERAL MENISECTOMY;  Surgeon: Hessie Knows, MD;  Location: [facility] ORS;  Service: Orthopedics;  Laterality: Right;  ? LEG SURGERY  1985  ? SHOULDER SURGERY    ? SUBACROMIAL DECOMPRESSION  2000  ? Right shoulder, [place]  ?  TONSILLECTOMY  2001  ? ?Patient Active Problem List  ? Diagnosis Date Noted  ? Antibiotic-induced yeast infection 12/13/2021  ? Sore throat 12/13/2021  ? Gastroesophageal reflux disease without esophagitis 12/13/2021  ? Sub-Achilles bursitis, left 09/07/2021  ? Chronic right flank pain 06/24/2021  ? Acute pain of left shoulder 10/19/2020  ? Abnormal gait 10/19/2020  ? Recurrent falls 10/19/2020  ? Chronic intractable headache 10/19/2020  ? Ganglion cyst 10/19/2020  ? Baker's cyst of knee, right 10/19/2020  ? Depo-Provera contraceptive status 02/11/2020  ? Hydronephrosis concurrent with and due to calculi of kidney and ureter 01/16/2020  ? Paralysis ([place]) 12/18/2019  ? Bilateral leg weakness 10/21/2019  ? Depression 06/19/2019  ? Acute right ankle pain 06/03/2019  ? Educated about COVID-19 virus infection 06/03/2019  ? Nephrolithiasis 03/24/2018  ? 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  ? Obesity, Class II, BMI 35-39.9 04/11/2017  ? Elevated liver enzymes 04/11/2017  ? Extrinsic asthma 08/15/2016  ? Solitary pulmonary nodule 07/03/2016  ? Essential hypertension, benign 04/04/2016  ? H/O varicella 08/19/2015  ? 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  ? Deformity, finger, 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  ? Bilateral thoracic back pain 04/21/2014  ? Encounter for preventive health examination 04/21/2014  ? Major depressive disorder, recurrent episode, moderate ([place]) 05/21/2013  ? Posterior right knee pain 03/26/2013  ? Personal history of traumatic brain injury   ? Eustachian tube dysfunction,  bilateral 05/05/2012  ? Intractable chronic post-traumatic headache 02/14/2012  ? ? ?REFERRING DIAG: S/p right  ? ?THERAPY DIAG:  ?Pain in left foot ? ?Difficulty in walking, not elsewhere classified ? ?PERTINENT HISTORY:  ? ?Per [employer] Note on 03/30/22 ? ? Routine Post Op  ?     POV #1 DOS 03/24/22 --- LENGTHENING OF CALF MUSCLE, TOPAZ PROCEDUREACHILLES, PLANTAR FASCIA AND PERONEAL TENDONS, PRP INJECTION ---- MCDONALD PT  ?  ?  ?DOS:  03/24/2022 ?Procedure: Lengthening of the calf muscle and Topaz procedure ?  ?41 y.o. female returns for post-op check.  Patient states that she is doing well.  Minimal pain.  She denies any other acute complaint bandages clean dry and intact. ? ? ?PRECAUTIONS: CAM boot while walking until told otherwise  ? ?SUBJECTIVE: Pt reports that her right calf is still painful but the heeling is feeling better  ? ?PAIN:  ?Are you having pain? Yes: NPRS scale: 5/10 ?Pain location: right calf  ?Pain description: Dull  ?Aggravating factors: Moving foot and placing weight on right foot  ?Relieving factors: keeping foot still  ? ?OBJECTIVE:  ? ? ?Observation/Other Assessments   ?  Focus on Therapeutic Outcomes (FOTO)  21   ?     ?  AROM  ?  Right/Left Ankle Left   ?  Left Ankle Dorsiflexion 9   4 degrees on 3/23 presurgical  ?  Left Ankle Plantar Flexion 48   ?     ?  PROM  ?  PROM Assessment Site Ankle   ?  Right/Left Ankle Left   ?  Left Ankle Dorsiflexion 27   0 degrees 3/23 presurgical  ?  Left Ankle Plantar Flexion 50   ?  Left Ankle Inversion 50   47 3/23 presurgical  ?  Left Ankle Eversion 24   15 3/23 presurgical  ?     ?  Ambulation/Gait  ?  Ambulation Distance (Feet) 200 Feet   cut of after 266f (pt not in her CAM rocker as ordered)  ?  Assistive device --   swedish knee cage  ?  Gait velocity 0.738m   ?  Gait Comments pain remains stable; pt does not have her [place] on this date to facilitate examination   ? ? ? ?TODAY'S TREATMENT:  ?04/25/22 ? ?BAPS L4 board PF/DF 3 x 10  ?BAPS L4 board Inv/Ev 3 x 10  ?BAPS L4 Clockwise circles 3 x 10  ?BAPS L4 Counter Clockwise circles 3 x 10  ? ?Seated Calf Stretch 2 x 30 sec  ?-min VC to maintain knee extension  ? ?Right Ankle MMT  ?-PF 5  ?-DF 5 ?-Inv 5 ?-Ev 5  ? ?Long sitting PF with resistance Gray Band 3 x 10  ?-Pt reports increase in pain to NPS 3/10   ? ?Long sitting right soleus stretch 2 x 30 sec  ? ?04/18/22 ? ?-standing left calf stretch wall push ?-seated heel raises  1x20x1secH ?-seated ankle DF 1x20x1secH ?  ? ? ? ?PATIENT EDUCATION: ?Education details: Form and technique for appropriate exercise  ?Person educated: Patient ?Education method: Explanation, Demonstration, Verbal cues, and Handouts ?Education comprehension: verbalized understanding, returned demonstration, tactile cues required, and needs further education ? ? ?HOME EXERCISE PROGRAM: ?Access Code: [email or web address]: [email or web address] ?Date: 04/25/2022 ?Prepared by: DaBradly Chris ?Exercises ?- Long Sitting Calf Stretch with Strap  - 1 x daily - 7 x weekly - 1 sets - 3 reps - 30 hold ?- Long Sitting Ankle Plantar  Flexion with Resistance  - 1 x daily - 3 x weekly - 3 sets - 10 reps ?- Long Sitting Soleus Stretch on Bolster with Strap  - 1 x daily - 7 x weekly - 1 sets - 3 reps - 30 hold ? ? PT Short Term Goals   ? ?  ? PT SHORT TERM GOAL #1  ? Title Patient will demonstrate understanding of home exercise plan reporting good compliance   ? Baseline issued at eval   ? Time 4   ? Period Weeks   ? Status New   ? Target Date 05/16/22   ?  ? PT SHORT TERM GOAL #2  ? Title Pt to improve AMB tolerance to >548f without exacerbation of pain.   ? Baseline eval: 2031f  ? Time 4   ? Period Weeks   ? Status New   ? Target Date 05/16/22   ? ?  ?  ? ?  ? ? ? PT Long Term Goals   ? ?  ? PT LONG TERM GOAL #1  ? Title Patient will have improved function and activity level as evidenced by an increase in FOTO score by 10 points or more.   ? Baseline Eval: 47   ? Time 8   ? Period Weeks   ? Status New   ? Target Date 06/13/22   ?  ? PT LONG TERM GOAL #2  ? Title Pt to demonstrate 5/5 strength in ankle DF, EV, IV left for improved motor control in gait.   ? Baseline eval: not tested   ? Time 8   ? Period Weeks   ? Status New   ? Target Date 06/13/22   ?  ? PT LONG TERM GOAL #3  ? Title Pt will decrease worst pain as reported on NPRS by at least 2 points in order to demonstrate clinically significant reduction in pain.   ?  Baseline eval: 6/10   ? Time 8   ? Period Weeks   ? Status New   ? Target Date 06/13/22   ?  ? PT LONG TERM GOAL #4  ? Title Pt to demonstrate 6MWT >120046fithout exacerbation in pain to improve ability to AMB in community.   ?

## 2022-04-27 ENCOUNTER — Encounter: Payer: Medicare Other | Admitting: Physical Therapy

## 2022-05-02 ENCOUNTER — Ambulatory Visit: Payer: Medicare Other | Admitting: Physical Therapy

## 2022-05-02 ENCOUNTER — Encounter: Payer: Self-pay | Admitting: Physical Therapy

## 2022-05-02 DIAGNOSIS — M7672 Peroneal tendinitis, left leg: Secondary | ICD-10-CM | POA: Diagnosis not present

## 2022-05-02 DIAGNOSIS — R262 Difficulty in walking, not elsewhere classified: Secondary | ICD-10-CM

## 2022-05-02 DIAGNOSIS — M79672 Pain in left foot: Secondary | ICD-10-CM

## 2022-05-02 DIAGNOSIS — M216X2 Other acquired deformities of left foot: Secondary | ICD-10-CM | POA: Diagnosis not present

## 2022-05-02 DIAGNOSIS — M7662 Achilles tendinitis, left leg: Secondary | ICD-10-CM | POA: Diagnosis not present

## 2022-05-02 NOTE — Therapy (Signed)
OUTPATIENT PHYSICAL THERAPY TREATMENT NOTE   Patient Name: Margaret Zuniga MRN: [id number] DOB:May 26, 1981, 41 y.o., female Today's Date: 05/02/2022  PCP: Dr. Derrel Nip  REFERRING PROVIDER: Dr. Sherryle Lis    PT End of Session - 05/02/22 1423     Visit Number 3    Number of Visits 16    Date for PT Re-Evaluation 07/11/22    Authorization Type UHC Dual Compl/Caid    Authorization Time Period 04/18/22-07/11/22    Progress Note Due on Visit 10    PT Start Time 1420    PT Stop Time 1500    PT Time Calculation (min) 40 min    Equipment Utilized During Treatment Gait belt    Activity Tolerance Patient tolerated treatment well;No increased pain    Behavior During Therapy [facility] for tasks assessed/performed             Past Medical History:  Diagnosis Date   Bilateral nephrolithiasis 03/24/2018   Essential hypertension, benign 04/04/2016   Extrinsic asthma 08/15/2016   Headache due to trauma    chronic, takes, NSAIDs , imipramine, muscle relaxers (failed [facility])   History of kidney stones    Hypertension    Major depressive disorder, recurrent episode, moderate ([place]) 05/21/2013   Obesity (BMI 30.0-34.9) 04/11/2017   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 Marry Guan   Past Surgical History:  Procedure Laterality Date   ELBOW SURGERY Right [place] LITHOTRIPSY Left 01/22/2020   Procedure: EXTRACORPOREAL SHOCK WAVE LITHOTRIPSY (ESWL);  Surgeon: Abbie Sons, MD;  Location: [facility] ORS;  Service: Urology;  Laterality: Left;   EYE SURGERY  1995   KNEE ARTHROSCOPY WITH LATERAL MENISECTOMY Right 03/04/2020   Procedure: KNEE ARTHROSCOPY WITH PARTIAL LATERAL MENISECTOMY;  Surgeon: Hessie Knows, MD;  Location: [facility] ORS;  Service: Orthopedics;  Laterality: Right;   LEG SURGERY  1985   SHOULDER SURGERY     SUBACROMIAL  DECOMPRESSION  2000   Right shoulder, [place]   TONSILLECTOMY  2001   Patient Active Problem List   Diagnosis Date Noted   Antibiotic-induced yeast infection 12/13/2021   Sore throat 12/13/2021   Gastroesophageal reflux disease without esophagitis 12/13/2021   Sub-Achilles bursitis, left 09/07/2021   Chronic right flank pain 06/24/2021   Acute pain of left shoulder 10/19/2020   Abnormal gait 10/19/2020   Recurrent falls 10/19/2020   Chronic intractable headache 10/19/2020   Ganglion cyst 10/19/2020   Baker's cyst of knee, right 10/19/2020   Depo-Provera contraceptive status 02/11/2020   Hydronephrosis concurrent with and due to calculi of kidney and ureter 01/16/2020   Paralysis ([place]) 12/18/2019   Bilateral leg weakness 10/21/2019   Depression 06/19/2019   Acute right ankle pain 06/03/2019   Educated about COVID-19 virus infection 06/03/2019   Nephrolithiasis 03/24/2018   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   Obesity, Class II, BMI 35-39.9 04/11/2017   Elevated liver enzymes 04/11/2017   Extrinsic asthma 08/15/2016   Solitary pulmonary nodule 07/03/2016   Essential hypertension, benign 04/04/2016   H/O varicella 08/19/2015   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   Deformity, finger, 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   Bilateral thoracic back pain 04/21/2014   Encounter for preventive health examination 04/21/2014   Major depressive disorder, recurrent episode, moderate ([place]) 05/21/2013   Posterior right knee pain 03/26/2013   Personal history of  traumatic brain injury    Eustachian tube dysfunction, bilateral 05/05/2012   Intractable chronic post-traumatic headache 02/14/2012    REFERRING DIAG: S/p right   THERAPY DIAG:  Pain in left foot  Difficulty in walking, not elsewhere classified  PERTINENT HISTORY:   Per [employer] Note on 03/30/22   Routine Post Op       POV #1 DOS 03/24/22 --- LENGTHENING OF CALF MUSCLE, TOPAZ PROCEDUREACHILLES, PLANTAR FASCIA AND PERONEAL TENDONS, PRP INJECTION  ---- MCDONALD PT      DOS: 03/24/2022 Procedure: Lengthening of the calf muscle and Topaz procedure   41 y.o. female returns for post-op check.  Patient states that she is doing well.  Minimal pain.  She denies any other acute complaint bandages clean dry and intact.   PRECAUTIONS: CAM boot while walking until told otherwise   SUBJECTIVE: Pt reports increased pain in her left achilles tendon and that she will be seeing podiatrist tomorrow for check up.   PAIN:  Are you having pain? Yes: NPRS scale: 8/10 Pain location: right calf  Pain description: Dull  Aggravating factors: Moving foot and placing weight on right foot  Relieving factors: keeping foot still   OBJECTIVE:    Observation/Other Assessments     Focus on Therapeutic Outcomes (FOTO)  47          AROM    Right/Left Ankle Left     Left Ankle Dorsiflexion 9   4 degrees on 3/23 presurgical    Left Ankle Plantar Flexion 48          PROM    PROM Assessment Site Ankle     Right/Left Ankle Left     Left Ankle Dorsiflexion 27   0 degrees 3/23 presurgical    Left Ankle Plantar Flexion 50     Left Ankle Inversion 50   47 3/23 presurgical    Left Ankle Eversion 24   15 3/23 presurgical         Ambulation/Gait    Ambulation Distance (Feet) 200 Feet   cut of after 253f (pt not in her CAM rocker as ordered)    Assistive device --   swedish knee cage    Gait velocity 0.763m     Gait Comments pain remains stable; pt does not have her [place] AFO on this date to facilitate examination      TODAY'S TREATMENT:  05/02/22  THEREX   BAPS L5 board PF/DF 3 x 10  BAPS L5 board Inv/Ev 3 x 10  BAPS L5 Clockwise circles 3 x 10  BAPS L5 Counter Clockwise circles 3 x 10  Long Sitting Calf Stretch on LLE 3 x 30 sec                          Long Sitting Soleus Stretch on LLE  3 x 30 se  LLE Eversion AROM 3 x 10   MANUAL  Lateral glides of left ankle in side lying grade III-IV x 30  AP ankle glides grade III-IV in long sitting   Calcaneal distraction x 30 in long sitting  Soft tissue mobilization of tib posterior along tarsal tunnel    04/25/22  BAPS L4 board PF/DF 3 x 10  BAPS L4 board Inv/Ev 3 x 10  BAPS L4 Clockwise circles 3 x 10  BAPS L4 Counter Clockwise circles 3 x 10   Seated Calf Stretch 2 x 30 sec  -min VC to maintain knee extension   Right  Ankle MMT  -PF 5  -DF 5 -Inv 5 -Ev 5   Long sitting PF with resistance Gray Band 3 x 10  -Pt reports increase in pain to NPS 3/10    Long sitting right soleus stretch 2 x 30 sec   04/18/22  -standing left calf stretch wall push -seated heel raises 1x20x1secH -seated ankle DF 1x20x1secH      PATIENT EDUCATION: Education details: Form and technique for appropriate exercise  Person educated: Patient Education method: Explanation, Demonstration, Verbal cues, and Handouts Education comprehension: verbalized understanding, returned demonstration, tactile cues required, and needs further education   HOME EXERCISE PROGRAM: Access Code: [email or web address] URL: [email or web address] Date: 05/02/2022 Prepared by: Bradly Chris  Exercises - Long Sitting Calf Stretch with Strap  - 1 x daily - 7 x weekly - 1 sets - 3 reps - 30 hold - Long Sitting Ankle Plantar Flexion with Resistance  - 1 x daily - 3 x weekly - 3 sets - 10 reps - Long Sitting Soleus Stretch on Bolster with Strap  - 1 x daily - 7 x weekly - 1 sets - 3 reps - 30 hold - Seated Ankle Inversion Eversion AROM  - 1 x daily - 7 x weekly - 3 sets - 10 reps   PT Short Term Goals       PT SHORT TERM GOAL #1   Title Patient will demonstrate understanding of home exercise plan reporting good compliance    Baseline issued at eval    Time 4    Period Weeks    Status New    Target Date 05/16/22      PT SHORT TERM GOAL #2   Title Pt to improve AMB tolerance to >567f without exacerbation of pain.    Baseline eval: 2075f   Time 4    Period Weeks    Status New    Target Date 05/16/22               PT Long Term Goals       PT LONG TERM GOAL #1   Title Patient will have improved function and activity level as evidenced by an increase in FOTO score by 10 points or more.    Baseline Eval: 47    Time 8    Period Weeks    Status New    Target Date 06/13/22      PT LONG TERM GOAL #2   Title Pt to demonstrate 5/5 strength in ankle DF, EV, IV left for improved motor control in gait.    Baseline eval: not tested    Time 8    Period Weeks    Status New    Target Date 06/13/22      PT LONG TERM GOAL #3   Title Pt will decrease worst pain as reported on NPRS by at least 2 points in order to demonstrate clinically significant reduction in pain.    Baseline eval: 6/10    Time 8    Period Weeks    Status New    Target Date 06/13/22      PT LONG TERM GOAL #4   Title Pt to demonstrate 6MWT >120081fithout exacerbation in pain to improve ability to AMB in community.    Baseline eval: [id number] test ([id number])    Time 12    Period Weeks    Status New    Target Date 07/11/22  Plan      Clinical Impression Statement Pt presents with left ankle in prom inversion as resting position instead of neutral. Session spent employing ankle mobilizations to increase left ankle dorsiflexion and eversion. Significant muscular tension felt in calf and tenderness along achilles tendon. Patient able to achieve ankle eversion with AROM. Awaiting approval by podiatrist to progress to weight bearing activity on LLE. She will continue to benefit from skilled PT to decrease right ankle pain with walking.     Personal Factors and Comorbidities Comorbidity 1;Time since onset of injury/illness/exacerbation;Past/Current Experience    Comorbidities TBI    Examination-Activity Limitations Locomotion Level    Examination-Participation Restrictions Other    Stability/Clinical Decision Making Evolving/Moderate complexity    Rehab Potential Good    Clinical Impairments  Affecting Rehab Potential (-)TBI with resulting prolonged nature of abnormal gait, (-) right ankle contracture , (+) Motivation, strong family support    PT Frequency 2x / week    PT Duration 12 weeks    PT Treatment/Interventions Joint Manipulations;Taping;Splinting;Manual techniques;Orthotic Fit/Training;Neuromuscular re-education;Therapeutic activities;Therapeutic exercise;Balance training;Gait training;Moist Heat;Traction;Aquatic Therapy;Cryotherapy;Dry needling;Passive range of motion    PT Next Visit Plan Hip and knee MMT, progress ankle strengthening exercises    Consulted and Agree with Plan of Care Patient              Bradly Chris PT, DPT  05/02/2022, 3:09 PM

## 2022-05-03 ENCOUNTER — Encounter: Payer: Self-pay | Admitting: Physical Therapy

## 2022-05-03 ENCOUNTER — Ambulatory Visit: Payer: Medicare Other | Admitting: Physical Therapy

## 2022-05-03 ENCOUNTER — Ambulatory Visit (INDEPENDENT_AMBULATORY_CARE_PROVIDER_SITE_OTHER): Payer: Medicare Other | Admitting: Podiatry

## 2022-05-03 DIAGNOSIS — M79672 Pain in left foot: Secondary | ICD-10-CM | POA: Diagnosis not present

## 2022-05-03 DIAGNOSIS — M7672 Peroneal tendinitis, left leg: Secondary | ICD-10-CM

## 2022-05-03 DIAGNOSIS — M722 Plantar fascial fibromatosis: Secondary | ICD-10-CM

## 2022-05-03 DIAGNOSIS — M7662 Achilles tendinitis, left leg: Secondary | ICD-10-CM | POA: Diagnosis not present

## 2022-05-03 DIAGNOSIS — M216X2 Other acquired deformities of left foot: Secondary | ICD-10-CM | POA: Diagnosis not present

## 2022-05-03 DIAGNOSIS — R262 Difficulty in walking, not elsewhere classified: Secondary | ICD-10-CM | POA: Diagnosis not present

## 2022-05-03 NOTE — Therapy (Signed)
OUTPATIENT PHYSICAL THERAPY TREATMENT NOTE   Patient Name: Margaret Zuniga MRN: [id number] DOB:[birth year], 41 y.o., female Today's Date: 05/03/2022  PCP: Dr. Derrel Nip  REFERRING PROVIDER: Dr. Sherryle Lis    PT End of Session - 05/03/22 1111     Visit Number 4    Number of Visits 16    Date for PT Re-Evaluation 07/11/22    Authorization Type UHC Dual Compl/Caid    Authorization Time Period 04/18/22-07/11/22    Progress Note Due on Visit 10    PT Start Time 1105    PT Stop Time 1145    PT Time Calculation (min) 40 min    Equipment Utilized During Treatment Gait belt    Activity Tolerance Patient tolerated treatment well;No increased pain    Behavior During Therapy [facility] for tasks assessed/performed             Past Medical History:  Diagnosis Date   Bilateral nephrolithiasis 03/24/2018   Essential hypertension, benign 04/04/2016   Extrinsic asthma 08/15/2016   Headache due to trauma    chronic, takes, NSAIDs , imipramine, muscle relaxers (failed [facility])   History of kidney stones    Hypertension    Major depressive disorder, recurrent episode, moderate ([place]) 05/21/2013   Obesity (BMI 30.0-34.9) 04/11/2017   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 Marry Guan   Past Surgical History:  Procedure Laterality Date   ELBOW SURGERY Right [place] LITHOTRIPSY Left 01/22/2020   Procedure: EXTRACORPOREAL SHOCK WAVE LITHOTRIPSY (ESWL);  Surgeon: Abbie Sons, MD;  Location: [facility] ORS;  Service: Urology;  Laterality: Left;   EYE SURGERY  1995   KNEE ARTHROSCOPY WITH LATERAL MENISECTOMY Right 03/04/2020   Procedure: KNEE ARTHROSCOPY WITH PARTIAL LATERAL MENISECTOMY;  Surgeon: Hessie Knows, MD;  Location: [facility] ORS;  Service: Orthopedics;  Laterality: Right;   LEG SURGERY  1985   SHOULDER SURGERY     SUBACROMIAL  DECOMPRESSION  2000   Right shoulder, [place]   TONSILLECTOMY  2001   Patient Active Problem List   Diagnosis Date Noted   Antibiotic-induced yeast infection 12/13/2021   Sore throat 12/13/2021   Gastroesophageal reflux disease without esophagitis 12/13/2021   Sub-Achilles bursitis, left 09/07/2021   Chronic right flank pain 06/24/2021   Acute pain of left shoulder 10/19/2020   Abnormal gait 10/19/2020   Recurrent falls 10/19/2020   Chronic intractable headache 10/19/2020   Ganglion cyst 10/19/2020   Baker's cyst of knee, right 10/19/2020   Depo-Provera contraceptive status 02/11/2020   Hydronephrosis concurrent with and due to calculi of kidney and ureter 01/16/2020   Paralysis ([place]) 12/18/2019   Bilateral leg weakness 10/21/2019   Depression 06/19/2019   Acute right ankle pain 06/03/2019   Educated about COVID-19 virus infection 06/03/2019   Nephrolithiasis 03/24/2018   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   Obesity, Class II, BMI 35-39.9 04/11/2017   Elevated liver enzymes 04/11/2017   Extrinsic asthma 08/15/2016   Solitary pulmonary nodule 07/03/2016   Essential hypertension, benign 04/04/2016   H/O varicella 08/19/2015   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   Deformity, finger, 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   Bilateral thoracic back pain 04/21/2014   Encounter for preventive health examination 04/21/2014   Major depressive disorder, recurrent episode, moderate ([place]) 05/21/2013   Posterior right knee pain 03/26/2013   Personal history of  traumatic brain injury    Eustachian tube dysfunction, bilateral 05/05/2012   Intractable chronic post-traumatic headache 02/14/2012    REFERRING DIAG: S/p right   THERAPY DIAG:  Pain in left foot  Difficulty in walking, not elsewhere classified  PERTINENT HISTORY:   Per [facility] Note on 03/30/22   Routine Post Op       POV #1 DOS 03/24/22 --- LENGTHENING OF CALF MUSCLE, TOPAZ PROCEDUREACHILLES, PLANTAR FASCIA AND PERONEAL TENDONS, PRP INJECTION  ---- MCDONALD PT      DOS: 03/24/2022 Procedure: Lengthening of the calf muscle and Topaz procedure   41 y.o. female returns for post-op check.  Patient states that she is doing well.  Minimal pain.  She denies any other acute complaint bandages clean dry and intact.   PRECAUTIONS: CAM boot while walking until told otherwise   SUBJECTIVE: Pt has been instructed that she should not longer use left CAM boot and that healing is going well from recent visit to her podiatrist   PAIN:  Are you having pain? Yes: NPRS scale: 6/10 Pain location: left calf  Pain description: Dull  Aggravating factors: Moving foot and placing weight on right foot  Relieving factors: keeping foot still   OBJECTIVE:    Observation/Other Assessments     Focus on Therapeutic Outcomes (FOTO)  47          AROM    Right/Left Ankle Left     Left Ankle Dorsiflexion 9   4 degrees on 3/23 presurgical    Left Ankle Plantar Flexion 48          PROM    PROM Assessment Site Ankle     Right/Left Ankle Left     Left Ankle Dorsiflexion 27   0 degrees 3/23 presurgical    Left Ankle Plantar Flexion 50     Left Ankle Inversion 50   47 3/23 presurgical    Left Ankle Eversion 24   15 3/23 presurgical         Ambulation/Gait    Ambulation Distance (Feet) 200 Feet   cut of after 254f (pt not in her CAM rocker as ordered)    Assistive device --   swedish knee cage    Gait velocity 0.733m     Gait Comments pain remains stable; pt does not have her [place] on this date to facilitate examination      TODAY'S TREATMENT:  05/04/22 Recumbent Bicycle with seat level 2 and resistance at 4  Standing Heel Raises with BUE support and not wearing [place] brace on left ankle 3 x 10  Seated Heel Raises with bent knee LLE and #10 DB  3 x 10   Standing Heel Stretch on LLE on 4 inch step 3 x 60 sec  Seated left ankle eversion with resistance yellow TB 3 x 10  05/02/22  THEREX   BAPS L5 board PF/DF 3 x 10  BAPS L5 board  Inv/Ev 3 x 10  BAPS L5 Clockwise circles 3 x 10  BAPS L5 Counter Clockwise circles 3 x 10  Long Sitting Calf Stretch on LLE 3 x 30 sec                          Long Sitting Soleus Stretch on LLE  3 x 30 se  LLE Eversion AROM 3 x 10   MANUAL  Lateral glides of left ankle in side lying grade III-IV x 30  AP ankle glides grade III-IV  in long sitting  Calcaneal distraction x 30 in long sitting  Soft tissue mobilization of tib posterior along tarsal tunnel    04/25/22  BAPS L4 board PF/DF 3 x 10  BAPS L4 board Inv/Ev 3 x 10  BAPS L4 Clockwise circles 3 x 10  BAPS L4 Counter Clockwise circles 3 x 10   Seated Calf Stretch 2 x 30 sec  -min VC to maintain knee extension   Right Ankle MMT  -PF 5  -DF 5 -Inv 5 -Ev 5   Long sitting PF with resistance Gray Band 3 x 10  -Pt reports increase in pain to NPS 3/10    Long sitting right soleus stretch 2 x 30 sec    PATIENT EDUCATION: Education details: Form and technique for appropriate exercise  Person educated: Patient Education method: Explanation, Demonstration, Verbal cues, and Handouts Education comprehension: verbalized understanding, returned demonstration, tactile cues required, and needs further education   HOME EXERCISE PROGRAM: Access Code: [email or web address] URL: [email or web address] Date: 05/03/2022 Prepared by: Bradly Chris  Exercises - Long Sitting Ankle Plantar Flexion with Resistance  - 1 x daily - 3 x weekly - 3 sets - 10 reps - Long Sitting Soleus Stretch on Bol[place] with Strap  - 1 x daily - 7 x weekly - 1 sets - 3 reps - 30 hold - Heel Raises with Counter Support  - 1 x daily - 3 x weekly - 3 sets - 10 reps - Seated Heel Raise  - 1 x daily - 3 x weekly - 3 sets - 10 reps - Ankle Eversion with Resistance  - 1 x daily - 3 x weekly - 3 sets - 10 reps - Standing Gastroc Stretch on Step  - 1 x daily - 7 x weekly - 1 sets - 3 reps - 30 hold   PT Short Term Goals       PT SHORT TERM GOAL #1   Title  Patient will demonstrate understanding of home exercise plan reporting good compliance    Baseline issued at eval    Time 4    Period Weeks    Status New    Target Date 05/16/22      PT SHORT TERM GOAL #2   Title Pt to improve AMB tolerance to >558f without exacerbation of pain.    Baseline eval: 2065f   Time 4    Period Weeks    Status New    Target Date 05/16/22              PT Long Term Goals       PT LONG TERM GOAL #1   Title Patient will have improved function and activity level as evidenced by an increase in FOTO score by 10 points or more.    Baseline Eval: 47    Time 8    Period Weeks    Status New    Target Date 06/13/22      PT LONG TERM GOAL #2   Title Pt to demonstrate 5/5 strength in ankle DF, EV, IV left for improved motor control in gait.    Baseline eval: not tested    Time 8    Period Weeks    Status New    Target Date 06/13/22      PT LONG TERM GOAL #3   Title Pt will decrease worst pain as reported on NPRS by at least 2 points in order to demonstrate clinically significant reduction in pain.    Baseline  eval: 6/10    Time 8    Period Weeks    Status New    Target Date 06/13/22      PT LONG TERM GOAL #4   Title Pt to demonstrate 6MWT >1264f without exacerbation in pain to improve ability to AMB in community.    Baseline eval: 208fwalk test (100029fn 2019)    Time 12    Period Weeks    Status New    Target Date 07/11/22              Plan      Clinical Impression Statement Pt presents without donning CAM boot on left ankle after seeing podiatrist earlier. She exhibits a progression of left ankle strength with ability to perform weight bearing exercises without an increase in her baseline pain and without donning the [place]. She will continue to benefit from skilled PT to decrease left ankle pain with walking and with weight bearing activities to carry out community ambulation and household mobility without discomfort       Personal Factors and Comorbidities Comorbidity 1;Time since onset of injury/illness/exacerbation;Past/Current Experience    Comorbidities TBI    Examination-Activity Limitations Locomotion Level    Examination-Participation Restrictions Other    Stability/Clinical Decision Making Evolving/Moderate complexity    Rehab Potential Good    Clinical Impairments Affecting Rehab Potential (-)TBI with resulting prolonged nature of abnormal gait, (-) right ankle contracture , (+) Motivation, strong family support    PT Frequency 2x / week    PT Duration 12 weeks    PT Treatment/Interventions Joint Manipulations;Taping;Splinting;Manual techniques;Orthotic Fit/Training;Neuromuscular re-education;Therapeutic activities;Therapeutic exercise;Balance training;Gait training;Moist Heat;Traction;Aquatic Therapy;Cryotherapy;Dry needling;Passive range of motion    PT Next Visit Plan Hip and knee MMT, progress ankle strengthening exercises. Soleus stretch and SLS balance activity   Consulted and Agree with Plan of Care Patient              DanBradly Chris, DPT  05/03/2022, 11:12 AM

## 2022-05-03 NOTE — Progress Notes (Signed)
  Subjective:  Patient ID: Margaret Zuniga, female    DOB: 05/05/1981,  MRN: [id number]  Chief Complaint  Patient presents with   Routine Post Op    POV #3 DOS 03/24/22 --- LENGTHENING OF CALF MUSCLE, TOPAZ PROCEDUREACHILLES, PLANTAR FASCIA AND PERONEAL TENDONS, PRP INJECTION     41 y.o. female returns for post-op check.  Still having tenderness of the distal Achilles that she started physical therapy  Review of Systems: Negative except as noted in the HPI. Denies N/V/F/Ch.   Objective:  There were no vitals filed for this visit. There is no height or weight on file to calculate BMI. Constitutional Well developed. Well nourished.  Vascular Foot warm and well perfused. Capillary refill normal to all digits.  Calf is soft and supple, no posterior calf or knee pain, negative Homans' sign  Neurologic Normal speech. Oriented to person, place, and time. Epicritic sensation to light touch grossly present bilaterally.  Dermatologic Skin healing well without signs of infection. Skin edges well coapted without signs of infection.  Orthopedic: Only mild tenderness distal Achilles   Assessment:   1. Tendonitis, Achilles, left   2. Peroneal tendinitis of left lower extremity   3. Plantar fasciitis, left    Plan:  Patient was evaluated and treated and all questions answered.  Has had some improvement now.  I recommend she transition out of the boot back to her [place] and shoe.  She do activities as tolerated.  I do not have restrictions for her for therapy.  Return in about 2 months (around 07/03/2022) for surgery follow up .

## 2022-05-04 ENCOUNTER — Encounter: Payer: Medicare Other | Admitting: Podiatry

## 2022-05-10 ENCOUNTER — Ambulatory Visit: Payer: Medicare Other | Admitting: Physical Therapy

## 2022-05-10 ENCOUNTER — Encounter: Payer: Self-pay | Admitting: Physical Therapy

## 2022-05-10 DIAGNOSIS — R262 Difficulty in walking, not elsewhere classified: Secondary | ICD-10-CM | POA: Diagnosis not present

## 2022-05-10 DIAGNOSIS — M79672 Pain in left foot: Secondary | ICD-10-CM | POA: Diagnosis not present

## 2022-05-10 DIAGNOSIS — M7662 Achilles tendinitis, left leg: Secondary | ICD-10-CM | POA: Diagnosis not present

## 2022-05-10 DIAGNOSIS — M216X2 Other acquired deformities of left foot: Secondary | ICD-10-CM | POA: Diagnosis not present

## 2022-05-10 DIAGNOSIS — M7672 Peroneal tendinitis, left leg: Secondary | ICD-10-CM | POA: Diagnosis not present

## 2022-05-10 NOTE — Therapy (Signed)
OUTPATIENT PHYSICAL THERAPY TREATMENT NOTE   Patient Name: Margaret Zuniga MRN: [id number] DOB:1981-01-12, 41 y.o., female Today's Date: 05/10/2022  PCP: Dr. Derrel Nip  REFERRING PROVIDER: Dr. Sherryle Lis    PT End of Session - 05/10/22 1605     Visit Number 5    Number of Visits 16    Date for PT Re-Evaluation 07/11/22    Authorization Type UHC Dual Compl/Caid    Authorization Time Period 04/18/22-07/11/22    Progress Note Due on Visit 10    PT Start Time 1600    PT Stop Time 1630    PT Time Calculation (min) 30 min    Equipment Utilized During Treatment Gait belt    Activity Tolerance Patient tolerated treatment well;No increased pain    Behavior During Therapy [facility] for tasks assessed/performed             Past Medical History:  Diagnosis Date   Bilateral nephrolithiasis 03/24/2018   Essential hypertension, benign 04/04/2016   Extrinsic asthma 08/15/2016   Headache due to trauma    chronic, takes, NSAIDs , imipramine, muscle relaxers (failed [facility])   History of kidney stones    Hypertension    Major depressive disorder, recurrent episode, moderate ([place]) 05/21/2013   Obesity (BMI 30.0-34.9) 04/11/2017   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 Marry Guan   Past Surgical History:  Procedure Laterality Date   ELBOW SURGERY Right [place] LITHOTRIPSY Left 01/22/2020   Procedure: EXTRACORPOREAL SHOCK WAVE LITHOTRIPSY (ESWL);  Surgeon: Abbie Sons, MD;  Location: [facility] ORS;  Service: Urology;  Laterality: Left;   EYE SURGERY  1995   KNEE ARTHROSCOPY WITH LATERAL MENISECTOMY Right 03/04/2020   Procedure: KNEE ARTHROSCOPY WITH PARTIAL LATERAL MENISECTOMY;  Surgeon: Hessie Knows, MD;  Location: [facility] ORS;  Service: Orthopedics;  Laterality: Right;   LEG SURGERY  1985   SHOULDER SURGERY     SUBACROMIAL  DECOMPRESSION  2000   Right shoulder, [place]   TONSILLECTOMY  2001   Patient Active Problem List   Diagnosis Date Noted   Antibiotic-induced yeast infection 12/13/2021   Sore throat 12/13/2021   Gastroesophageal reflux disease without esophagitis 12/13/2021   Sub-Achilles bursitis, left 09/07/2021   Chronic right flank pain 06/24/2021   Acute pain of left shoulder 10/19/2020   Abnormal gait 10/19/2020   Recurrent falls 10/19/2020   Chronic intractable headache 10/19/2020   Ganglion cyst 10/19/2020   Baker's cyst of knee, right 10/19/2020   Depo-Provera contraceptive status 02/11/2020   Hydronephrosis concurrent with and due to calculi of kidney and ureter 01/16/2020   Paralysis ([place]) 12/18/2019   Bilateral leg weakness 10/21/2019   Depression 06/19/2019   Acute right ankle pain 06/03/2019   Educated about COVID-19 virus infection 06/03/2019   Nephrolithiasis 03/24/2018   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   Obesity, Class II, BMI 35-39.9 04/11/2017   Elevated liver enzymes 04/11/2017   Extrinsic asthma 08/15/2016   Solitary pulmonary nodule 07/03/2016   Essential hypertension, benign 04/04/2016   H/O varicella 08/19/2015   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   Deformity, finger, 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   Bilateral thoracic back pain 04/21/2014   Encounter for preventive health examination 04/21/2014   Major depressive disorder, recurrent episode, moderate ([place]) 05/21/2013   Posterior right knee pain 03/26/2013   Personal history of  traumatic brain injury    Eustachian tube dysfunction, bilateral 05/05/2012   Intractable chronic post-traumatic headache 02/14/2012    REFERRING DIAG: S/p right   THERAPY DIAG:  Pain in left foot  Difficulty in walking, not elsewhere classified  PERTINENT HISTORY:   Per [facility] Note on 03/30/22   Routine Post Op       POV #1 DOS 03/24/22 --- LENGTHENING OF CALF MUSCLE, TOPAZ PROCEDUREACHILLES, PLANTAR FASCIA AND PERONEAL TENDONS, PRP INJECTION  ---- MCDONALD PT      DOS: 03/24/2022 Procedure: Lengthening of the calf muscle and Topaz procedure   41 y.o. female returns for post-op check.  Patient states that she is doing well.  Minimal pain.  She denies any other acute complaint bandages clean dry and intact.   PRECAUTIONS: CAM boot while walking until told otherwise   SUBJECTIVE: Pt reports increased pain in her left foot in achilles heel with insidious onset. She is not sure what has brought on the pain.   PAIN:  Are you having pain? Yes: NPRS scale: 8/10 Pain location: left calf  Pain description: Dull  Aggravating factors: Moving foot and placing weight on right foot  Relieving factors: keeping foot still   OBJECTIVE:    Observation/Other Assessments     Focus on Therapeutic Outcomes (FOTO)  47          AROM    Right/Left Ankle Left     Left Ankle Dorsiflexion 9   4 degrees on 3/23 presurgical    Left Ankle Plantar Flexion 48          PROM    PROM Assessment Site Ankle     Right/Left Ankle Left     Left Ankle Dorsiflexion 27   0 degrees 3/23 presurgical    Left Ankle Plantar Flexion 50     Left Ankle Inversion 50   47 3/23 presurgical    Left Ankle Eversion 24   15 3/23 presurgical         Ambulation/Gait    Ambulation Distance (Feet) 200 Feet   cut of after 256f (pt not in her CAM rocker as ordered)    Assistive device --   swedish knee cage    Gait velocity 0.714m     Gait Comments pain remains stable; pt does not have her [place] AFO on this date to facilitate examination      TODAY'S TREATMENT:  05/10/22  THEREX   BAPS L5 board PF/DF 3 x 10  BAPS L5 board Inv/Ev 3 x 10  Partial Lunge with BUE support with RLE forward 3 x 10  Left Calf Stretch off of a 5 inch step 5 x 30 sec  -7/10 for NPS  Left Soleus Stretch on wall 5 x 30 sec  Single Leg Stance on LLE 10 x 5 sec holds   MANUAL  Right Calf Stretch in sitting 5 x 30 sec  -7/10 for NPS  Right heel cord and calf MFR   05/04/22 Recumbent  Bicycle with seat level 2 and resistance at 4  Standing Heel Raises with BUE support and not wearing [place] brace on left ankle 3 x 10  Seated Heel Raises with bent knee LLE and #10 DB  3 x 10   Standing Heel Stretch on LLE on 4 inch step 3 x 60 sec  Seated left ankle eversion with resistance yellow TB 3 x 10  05/02/22  THEREX   BAPS L5 board PF/DF 3 x 10  BAPS L5 board Inv/Ev  3 x 10  BAPS L5 Clockwise circles 3 x 10  BAPS L5 Counter Clockwise circles 3 x 10  Long Sitting Calf Stretch on LLE 3 x 30 sec                          Long Sitting Soleus Stretch on LLE  3 x 30 se  LLE Eversion AROM 3 x 10   MANUAL  Lateral glides of left ankle in side lying grade III-IV x 30  AP ankle glides grade III-IV in long sitting  Calcaneal distraction x 30 in long sitting  Soft tissue mobilization of tib posterior along tarsal tunnel       PATIENT EDUCATION: Education details: Form and technique for appropriate exercise  Person educated: Patient Education method: Explanation, Demonstration, Verbal cues, and Handouts Education comprehension: verbalized understanding, returned demonstration, tactile cues required, and needs further education   HOME EXERCISE PROGRAM: Access Code: [email or web address] URL: [email or web address] Date: 05/10/2022 Prepared by: Bradly Chris  Exercises - Long Sitting Ankle Plantar Flexion with Resistance  - 1 x daily - 3 x weekly - 3 sets - 10 reps - Soleus Stretch on Wall  - 1 x daily - 7 x weekly - 1 sets - 3 reps - 30 hold - Heel Raises with Counter Support  - 1 x daily - 3 x weekly - 3 sets - 10 reps - Ankle Eversion with Resistance  - 1 x daily - 3 x weekly - 3 sets - 10 reps - Standing Gastroc Stretch on Step  - 1 x daily - 7 x weekly - 1 sets - 3 reps - 30 hold - Single Leg Stance  - 1 x daily - 3 x weekly - 1 sets - 5 reps - 10 hold   PT Short Term Goals       PT SHORT TERM GOAL #1   Title Patient will demonstrate understanding of home exercise  plan reporting good compliance    Baseline issued at eval    Time 4    Period Weeks    Status New    Target Date 05/16/22      PT SHORT TERM GOAL #2   Title Pt to improve AMB tolerance to >537f without exacerbation of pain.    Baseline eval: 2076f   Time 4    Period Weeks    Status New    Target Date 05/16/22              PT Long Term Goals       PT LONG TERM GOAL #1   Title Patient will have improved function and activity level as evidenced by an increase in FOTO score by 10 points or more.    Baseline Eval: 47    Time 8    Period Weeks    Status New    Target Date 06/13/22      PT LONG TERM GOAL #2   Title Pt to demonstrate 5/5 strength in ankle DF, EV, IV left for improved motor control in gait.    Baseline eval: not tested    Time 8    Period Weeks    Status New    Target Date 06/13/22      PT LONG TERM GOAL #3   Title Pt will decrease worst pain as reported on NPRS by at least 2 points in order to demonstrate clinically significant reduction in pain.    Baseline eval: 6/10  Time 8    Period Weeks    Status New    Target Date 06/13/22      PT LONG TERM GOAL #4   Title Pt to demonstrate 6MWT >1281f without exacerbation in pain to improve ability to AMB in community.    Baseline eval: 2027fwalk test (100029fn 2019)    Time 12    Period Weeks    Status New    Target Date 07/11/22              Plan      Clinical Impression Statement Pt presents with increased right achilles pain that worsened since last visit with no clear aggravating factor. Despite increased pain, she was able to perform all exercises without an increase in her baseline pain. She did experience a decrease in her baseline pain after calf stretch. She will continue to benefit from skilled PT to reduce left achilles heel pain and increase achilles heel extensibility to return to walking and standing without pain or discomfort.      Personal Factors and Comorbidities Comorbidity  1;Time since onset of injury/illness/exacerbation;Past/Current Experience    Comorbidities TBI    Examination-Activity Limitations Locomotion Level    Examination-Participation Restrictions Other    Stability/Clinical Decision Making Evolving/Moderate complexity    Rehab Potential Good    Clinical Impairments Affecting Rehab Potential (-)TBI with resulting prolonged nature of abnormal gait, (-) right ankle contracture , (+) Motivation, strong family support    PT Frequency 2x / week    PT Duration 12 weeks    PT Treatment/Interventions Joint Manipulations;Taping;Splinting;Manual techniques;Orthotic Fit/Training;Neuromuscular re-education;Therapeutic activities;Therapeutic exercise;Balance training;Gait training;Moist Heat;Traction;Aquatic Therapy;Cryotherapy;Dry needling;Passive range of motion    PT Next Visit Plan Hip and knee MMT, progress ankle strengthening exercises. Soleus stretch and SLS balance activity   Consulted and Agree with Plan of Care Patient              DanBradly Chris, DPT  05/10/2022, 4:06 PM

## 2022-05-11 ENCOUNTER — Other Ambulatory Visit: Payer: Self-pay | Admitting: Internal Medicine

## 2022-05-15 ENCOUNTER — Telehealth: Payer: Self-pay | Admitting: Podiatry

## 2022-05-15 ENCOUNTER — Encounter: Payer: Self-pay | Admitting: Physical Therapy

## 2022-05-15 ENCOUNTER — Ambulatory Visit: Payer: Medicare Other | Attending: Podiatry | Admitting: Physical Therapy

## 2022-05-15 DIAGNOSIS — M79671 Pain in right foot: Secondary | ICD-10-CM | POA: Diagnosis not present

## 2022-05-15 DIAGNOSIS — R262 Difficulty in walking, not elsewhere classified: Secondary | ICD-10-CM | POA: Insufficient documentation

## 2022-05-15 DIAGNOSIS — M79672 Pain in left foot: Secondary | ICD-10-CM | POA: Diagnosis not present

## 2022-05-15 NOTE — Telephone Encounter (Signed)
Called pt- scheduled her a follow up 05/31/2022 at 3:15 with Dr Sherryle Lis.

## 2022-05-15 NOTE — Therapy (Signed)
OUTPATIENT PHYSICAL THERAPY TREATMENT NOTE   Patient Name: Margaret Zuniga MRN: [id number] DOB:[birth year], 41 y.o., female Today's Date: 05/15/2022  PCP: Dr. Derrel Nip  REFERRING PROVIDER: Dr. Sherryle Lis    PT End of Session - 05/15/22 1158     Visit Number 6    Number of Visits 16    Date for PT Re-Evaluation 07/11/22    Authorization Type UHC Dual Compl/Caid    Authorization Time Period 04/18/22-07/11/22    Progress Note Due on Visit 10    PT Start Time 6659    PT Stop Time 1230    PT Time Calculation (min) 45 min    Equipment Utilized During Treatment Gait belt    Activity Tolerance Patient tolerated treatment well;No increased pain    Behavior During Therapy [facility] for tasks assessed/performed             Past Medical History:  Diagnosis Date   Bilateral nephrolithiasis 03/24/2018   Essential hypertension, benign 04/04/2016   Extrinsic asthma 08/15/2016   Headache due to trauma    chronic, takes, NSAIDs , imipramine, muscle relaxers (failed [facility])   History of kidney stones    Hypertension    Major depressive disorder, recurrent episode, moderate ([place]) 05/21/2013   Obesity (BMI 30.0-34.9) 04/11/2017   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 Marry Guan   Past Surgical History:  Procedure Laterality Date   ELBOW SURGERY Right [place] LITHOTRIPSY Left 01/22/2020   Procedure: EXTRACORPOREAL SHOCK WAVE LITHOTRIPSY (ESWL);  Surgeon: Abbie Sons, MD;  Location: [facility] ORS;  Service: Urology;  Laterality: Left;   EYE SURGERY  1995   KNEE ARTHROSCOPY WITH LATERAL MENISECTOMY Right 03/04/2020   Procedure: KNEE ARTHROSCOPY WITH PARTIAL LATERAL MENISECTOMY;  Surgeon: Hessie Knows, MD;  Location: [facility] ORS;  Service: Orthopedics;  Laterality: Right;   LEG SURGERY  1985   SHOULDER SURGERY     SUBACROMIAL  DECOMPRESSION  2000   Right shoulder, [place]   TONSILLECTOMY  2001   Patient Active Problem List   Diagnosis Date Noted   Antibiotic-induced yeast infection 12/13/2021   Sore throat 12/13/2021   Gastroesophageal reflux disease without esophagitis 12/13/2021   Sub-Achilles bursitis, left 09/07/2021   Chronic right flank pain 06/24/2021   Acute pain of left shoulder 10/19/2020   Abnormal gait 10/19/2020   Recurrent falls 10/19/2020   Chronic intractable headache 10/19/2020   Ganglion cyst 10/19/2020   Baker's cyst of knee, right 10/19/2020   Depo-Provera contraceptive status 02/11/2020   Hydronephrosis concurrent with and due to calculi of kidney and ureter 01/16/2020   Paralysis ([place]) 12/18/2019   Bilateral leg weakness 10/21/2019   Depression 06/19/2019   Acute right ankle pain 06/03/2019   Educated about COVID-19 virus infection 06/03/2019   Nephrolithiasis 03/24/2018   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   Obesity, Class II, BMI 35-39.9 04/11/2017   Elevated liver enzymes 04/11/2017   Extrinsic asthma 08/15/2016   Solitary pulmonary nodule 07/03/2016   Essential hypertension, benign 04/04/2016   H/O varicella 08/19/2015   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   Deformity, finger, 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   Bilateral thoracic back pain 04/21/2014   Encounter for preventive health examination 04/21/2014   Major depressive disorder, recurrent episode, moderate ([place]) 05/21/2013   Posterior right knee pain 03/26/2013   Personal history of  traumatic brain injury    Eustachian tube dysfunction, bilateral 05/05/2012   Intractable chronic post-traumatic headache 02/14/2012    REFERRING DIAG: S/p right   THERAPY DIAG:  Pain in left foot  Difficulty in walking, not elsewhere classified  PERTINENT HISTORY:   Per [facility] Note on 03/30/22   Routine Post Op       POV #1 DOS 03/24/22 --- LENGTHENING OF CALF MUSCLE, TOPAZ PROCEDUREACHILLES, PLANTAR FASCIA AND PERONEAL TENDONS, PRP INJECTION  ---- MCDONALD PT      DOS: 03/24/2022 Procedure: Lengthening of the calf muscle and Topaz procedure   42 y.o. female returns for post-op check.  Patient states that she is doing well.  Minimal pain.  She denies any other acute complaint bandages clean dry and intact.   PRECAUTIONS: CAM boot while walking until told otherwise   SUBJECTIVE: Pt continues   PAIN:  Are you having pain? Yes: NPRS scale: 9/10 Pain location: left calf  Pain description: Dull  Aggravating factors: Moving foot and placing weight on right foot  Relieving factors: keeping foot still   OBJECTIVE:    Observation/Other Assessments     Focus on Therapeutic Outcomes (FOTO)  47          AROM    Right/Left Ankle Left     Left Ankle Dorsiflexion 9   4 degrees on 3/23 presurgical    Left Ankle Plantar Flexion 48          PROM    PROM Assessment Site Ankle     Right/Left Ankle Left     Left Ankle Dorsiflexion 27   0 degrees 3/23 presurgical    Left Ankle Plantar Flexion 50     Left Ankle Inversion 50   47 3/23 presurgical    Left Ankle Eversion 24   15 3/23 presurgical         Ambulation/Gait    Ambulation Distance (Feet) 200 Feet   cut of after 266f (pt not in her CAM rocker as ordered)    Assistive device --   swedish knee cage    Gait velocity 0.774m     Gait Comments pain remains stable; pt does not have her [place] on this date to facilitate examination      TODAY'S TREATMENT:  05/15/22 Matrix Recumbent Bicycle 5 min at resistance of 1   Hip MMT Flex R/L 4+/5  Abduct R/L 4+/5  Add R/L 4/4  Ext R/L 5/5   Supine Soleus Stretch 3 x 30 sec   Reverse Lunges on sliders with BUE support 3 x 10  -min VC to maintain heel flat on ground for increased calf stretch  -Experiencing 9/10 pain              Standing Abduction and Adduction with BUE support 3 x 10    05/10/22  THEREX   BAPS L5 board PF/DF 3 x 10  BAPS L5 board Inv/Ev 3 x 10  Partial Lunge with BUE support with RLE forward 3 x  10  Left Calf Stretch off of a 5 inch step 5 x 30 sec  -7/10 for NPS  Left Soleus Stretch on wall 5 x 30 sec  Single Leg Stance on LLE 10 x 5 sec holds   MANUAL  Right Calf Stretch in sitting 5 x 30 sec  -7/10 for NPS  Right heel cord and calf MFR   05/04/22 Recumbent Bicycle with seat level 2 and resistance at 4  Standing Heel Raises with BUE support  and not wearing [place] brace on left ankle 3 x 10  Seated Heel Raises with bent knee LLE and #10 DB  3 x 10   Standing Heel Stretch on LLE on 4 inch step 3 x 60 sec  Seated left ankle eversion with resistance yellow TB 3 x 10  05/02/22  THEREX   BAPS L5 board PF/DF 3 x 10  BAPS L5 board Inv/Ev 3 x 10  BAPS L5 Clockwise circles 3 x 10  BAPS L5 Counter Clockwise circles 3 x 10  Long Sitting Calf Stretch on LLE 3 x 30 sec                          Long Sitting Soleus Stretch on LLE  3 x 30 se  LLE Eversion AROM 3 x 10   MANUAL  Lateral glides of left ankle in side lying grade III-IV x 30  AP ankle glides grade III-IV in long sitting  Calcaneal distraction x 30 in long sitting  Soft tissue mobilization of tib posterior along tarsal tunnel       PATIENT EDUCATION: Education details: Form and technique for appropriate exercise  Person educated: Patient Education method: Explanation, Demonstration, Verbal cues, and Handouts Education comprehension: verbalized understanding, returned demonstration, tactile cues required, and needs further education   HOME EXERCISE PROGRAM: Access Code: [email or web address] URL: [email or web address] Date: 05/10/2022 Prepared by: Bradly Chris  Exercises - Long Sitting Ankle Plantar Flexion with Resistance  - 1 x daily - 3 x weekly - 3 sets - 10 reps - Soleus Stretch on Wall  - 1 x daily - 7 x weekly - 1 sets - 3 reps - 30 hold - Heel Raises with Counter Support  - 1 x daily - 3 x weekly - 3 sets - 10 reps - Ankle Eversion with Resistance  - 1 x daily - 3 x weekly - 3 sets - 10 reps -  Standing Gastroc Stretch on Step  - 1 x daily - 7 x weekly - 1 sets - 3 reps - 30 hold - Single Leg Stance  - 1 x daily - 3 x weekly - 1 sets - 5 reps - 10 hold   PT Short Term Goals       PT SHORT TERM GOAL #1   Title Patient will demonstrate understanding of home exercise plan reporting good compliance    Baseline issued at eval    Time 4    Period Weeks    Status New    Target Date 05/16/22      PT SHORT TERM GOAL #2   Title Pt to improve AMB tolerance to >590f without exacerbation of pain.    Baseline eval: 2038f   Time 4    Period Weeks    Status New    Target Date 05/16/22              PT Long Term Goals       PT LONG TERM GOAL #1   Title Patient will have improved function and activity level as evidenced by an increase in FOTO score by 10 points or more.    Baseline Eval: 47    Time 8    Period Weeks    Status New    Target Date 06/13/22      PT LONG TERM GOAL #2   Title Pt to demonstrate 5/5 strength in ankle DF, EV, IV left for improved motor control in gait.  Baseline eval: not tested    Time 8    Period Weeks    Status New    Target Date 06/13/22      PT LONG TERM GOAL #3   Title Pt will decrease worst pain as reported on NPRS by at least 2 points in order to demonstrate clinically significant reduction in pain.    Baseline eval: 6/10    Time 8    Period Weeks    Status New    Target Date 06/13/22      PT LONG TERM GOAL #4   Title Pt to demonstrate 6MWT >1273f without exacerbation in pain to improve ability to AMB in community.    Baseline eval: 2089fwalk test (100047fn 2019)    Time 12    Period Weeks    Status New    Target Date 07/11/22              Plan      Clinical Impression Statement Pt continues to present with increased left achilles pain that has been happening for past several weeks and that worsens with weight bearing activity. Despite high baseline pain, she is able to perform all ankle and LE exercises without an  increase in her baseline pain. Minor hip weakness noted with majority stemming from ongoing right sided hemiparesis.  PT instructed pt that this information would be communicated to her podiatrist and that she should follow up with his office to discuss next steps for pain management. She will continue to benefit from skilled PT to reduce left achilles heel pain and increase achilles heel extensibility to return to walking and standing without pain or discomfort.     Personal Factors and Comorbidities Comorbidity 1;Time since onset of injury/illness/exacerbation;Past/Current Experience    Comorbidities TBI    Examination-Activity Limitations Locomotion Level    Examination-Participation Restrictions Other    Stability/Clinical Decision Making Evolving/Moderate complexity    Rehab Potential Good    Clinical Impairments Affecting Rehab Potential (-)TBI with resulting prolonged nature of abnormal gait, (-) right ankle contracture , (+) Motivation, strong family support    PT Frequency 2x / week    PT Duration 12 weeks    PT Treatment/Interventions Joint Manipulations;Taping;Splinting;Manual techniques;Orthotic Fit/Training;Neuromuscular re-education;Therapeutic activities;Therapeutic exercise;Balance training;Gait training;Moist Heat;Traction;Aquatic Therapy;Cryotherapy;Dry needling;Passive range of motion    PT Next Visit Plan Progress ankle strengthening exercises.  Heel raises on step   Consulted and Agree with Plan of Care Patient              DanBradly Chris, DPT  05/15/2022, 12:00 PM

## 2022-05-15 NOTE — Telephone Encounter (Signed)
-----   Message from Criselda Peaches, DPM sent at 05/15/2022  1:23 PM EDT ----- Regarding: f/u Can you have her scheduled to see me in 2 weeks for follow up? Scheduled currently for July but would like to see her sooner because her PT said she's having issues

## 2022-05-16 ENCOUNTER — Encounter: Payer: Medicare Other | Admitting: Physical Therapy

## 2022-05-17 ENCOUNTER — Ambulatory Visit: Payer: Medicare Other | Admitting: Physical Therapy

## 2022-05-17 ENCOUNTER — Encounter: Payer: Self-pay | Admitting: Physical Therapy

## 2022-05-17 DIAGNOSIS — R262 Difficulty in walking, not elsewhere classified: Secondary | ICD-10-CM | POA: Diagnosis not present

## 2022-05-17 DIAGNOSIS — M79671 Pain in right foot: Secondary | ICD-10-CM | POA: Diagnosis not present

## 2022-05-17 DIAGNOSIS — M79672 Pain in left foot: Secondary | ICD-10-CM | POA: Diagnosis not present

## 2022-05-17 NOTE — Therapy (Signed)
OUTPATIENT PHYSICAL THERAPY TREATMENT NOTE   Patient Name: Margaret Zuniga MRN: [id number] DOB:[birth year], 41 y.o., female Today's Date: 05/17/2022  PCP: Dr. Derrel Nip  REFERRING PROVIDER: Dr. Sherryle Lis    PT End of Session - 05/17/22 1111     Visit Number 7    Number of Visits 16    Date for PT Re-Evaluation 07/11/22    Authorization Type UHC Dual Compl/Caid    Authorization Time Period 04/18/22-07/11/22    Progress Note Due on Visit 10    PT Start Time 1105    PT Stop Time 1145    PT Time Calculation (min) 40 min    Equipment Utilized During Treatment Gait belt    Activity Tolerance Patient tolerated treatment well;No increased pain    Behavior During Therapy [facility] for tasks assessed/performed             Past Medical History:  Diagnosis Date   Bilateral nephrolithiasis 03/24/2018   Essential hypertension, benign 04/04/2016   Extrinsic asthma 08/15/2016   Headache due to trauma    chronic, takes, NSAIDs , imipramine, muscle relaxers (failed [facility])   History of kidney stones    Hypertension    Major depressive disorder, recurrent episode, moderate ([place]) 05/21/2013   Obesity (BMI 30.0-34.9) 04/11/2017   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 Marry Guan   Past Surgical History:  Procedure Laterality Date   ELBOW SURGERY Right [place] LITHOTRIPSY Left 01/22/2020   Procedure: EXTRACORPOREAL SHOCK WAVE LITHOTRIPSY (ESWL);  Surgeon: Abbie Sons, MD;  Location: [facility] ORS;  Service: Urology;  Laterality: Left;   EYE SURGERY  1995   KNEE ARTHROSCOPY WITH LATERAL MENISECTOMY Right 03/04/2020   Procedure: KNEE ARTHROSCOPY WITH PARTIAL LATERAL MENISECTOMY;  Surgeon: Hessie Knows, MD;  Location: [facility] ORS;  Service: Orthopedics;  Laterality: Right;   LEG SURGERY  1985   SHOULDER SURGERY     SUBACROMIAL  DECOMPRESSION  2000   Right shoulder, [place]   TONSILLECTOMY  2001   Patient Active Problem List   Diagnosis Date Noted   Antibiotic-induced yeast infection 12/13/2021   Sore throat 12/13/2021   Gastroesophageal reflux disease without esophagitis 12/13/2021   Sub-Achilles bursitis, left 09/07/2021   Chronic right flank pain 06/24/2021   Acute pain of left shoulder 10/19/2020   Abnormal gait 10/19/2020   Recurrent falls 10/19/2020   Chronic intractable headache 10/19/2020   Ganglion cyst 10/19/2020   Baker's cyst of knee, right 10/19/2020   Depo-Provera contraceptive status 02/11/2020   Hydronephrosis concurrent with and due to calculi of kidney and ureter 01/16/2020   Paralysis ([place]) 12/18/2019   Bilateral leg weakness 10/21/2019   Depression 06/19/2019   Acute right ankle pain 06/03/2019   Educated about COVID-19 virus infection 06/03/2019   Nephrolithiasis 03/24/2018   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   Obesity, Class II, BMI 35-39.9 04/11/2017   Elevated liver enzymes 04/11/2017   Extrinsic asthma 08/15/2016   Solitary pulmonary nodule 07/03/2016   Essential hypertension, benign 04/04/2016   H/O varicella 08/19/2015   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  THERAPY DIAG:  Pain in left foot  Difficulty in walking, not elsewhere classified  PERTINENT HISTORY:   Per [facility] Note on 03/30/22   Routine Post Op       POV #1 DOS 03/24/22 --- LENGTHENING OF CALF MUSCLE, TOPAZ PROCEDUREACHILLES,  PLANTAR FASCIA AND PERONEAL TENDONS, PRP INJECTION ---- MCDONALD PT      DOS: 03/24/2022 Procedure: Lengthening of the calf muscle and Topaz procedure   41 y.o. female returns for post-op check.  Patient states that she is doing well.  Minimal pain.  She denies any other acute complaint bandages clean dry and intact.   PRECAUTIONS: CAM boot while walking until told otherwise   SUBJECTIVE: Pt continues feel increased left heel pain that she has not been able to control with pain medications.   PAIN:  Are you having pain? Yes: NPRS scale: 9/10 Pain location: left calf  Pain description: Dull  Aggravating factors: Moving foot and placing weight on right foot  Relieving factors: keeping foot still   OBJECTIVE:    Observation/Other Assessments     Focus on Therapeutic Outcomes (FOTO)  47          AROM    Right/Left Ankle Left     Left Ankle Dorsiflexion 9   4 degrees on 3/23 presurgical    Left Ankle Plantar Flexion 48          PROM    PROM Assessment Site Ankle     Right/Left Ankle Left     Left Ankle Dorsiflexion 27   0 degrees 3/23 presurgical    Left Ankle Plantar Flexion 50     Left Ankle Inversion 50   47 3/23 presurgical    Left Ankle Eversion 24   15 3/23 presurgical         Ambulation/Gait    Ambulation Distance (Feet) 200 Feet   cut of after 219f (pt not in her CAM rocker as ordered)    Assistive device --   sw[place]ish knee cage    Gait velocity 0.750m     Gait Comments pain remains stable; pt does not have her [place] on this date to facilitate examination      TODAY'S TREATMENT:  05/17/22 Matrix Recumbent Bicycle 5 min at resistance of 2  Prostretcher 3 x 30 sec on LLE for calf stretch  Long sitting soleus Stretch on LLE  3 x 30 sec   Ankle MMT  Inversion R/L 1/5 Eversion R/L 1/5 DF  R/L 1/5 PF R/L 1/5   Ankle ROM  -Left ankle is 25 deg inversion PROM   MANUAL   Soft tissue massage and trigger point release of left calf  -Pt reports decreased  pain 8/10 NPS after massage   05/15/22 Matrix Recumbent Bicycle 5 min at resistance of 1   Hip MMT Flex R/L 4+/5  Abduct R/L 4+/5  Add R/L 4/4  Ext R/L 5/5   Supine Soleus Stretch 3 x 30 sec   Reverse Lunges on sliders with BUE support 3 x 10  -min VC to maintain heel flat on ground for increased calf stretch  -Experiencing 9/10 pain              Standing Abduction and Adduction with BUE support 3 x 10    05/10/22  THEREX  BAPS L5 board PF/DF 3 x 10  BAPS L5 board Inv/Ev 3 x 10  Partial Lunge with BUE support with RLE forward 3 x 10  Left Calf Stretch off of a 5 inch step 5 x 30 sec  -7/10 for NPS  Left Soleus Stretch on wall 5 x 30 sec  Single Leg Stance on LLE 10 x 5 sec holds   MANUAL  Right Calf Stretch in sitting 5 x 30 sec  -7/10 for NPS  Right heel cord and calf MFR       PATIENT EDUCATION: Education details: Form and technique for appropriate exercise  Person educated: Patient Education method: Explanation, Demonstration, Verbal cues, and Handouts Education comprehension: verbalized understanding, returned demonstration, tactile cues required, and needs further education   HOME EXERCISE PROGRAM: Access Code: [email or web address] URL: [email or web address] Date: 05/10/2022 Prepared by: Bradly Chris  Exercises - Long Sitting Ankle Plantar Flexion with Resistance  - 1 x daily - 3 x weekly - 3 sets - 10 reps - Soleus Stretch on Wall  - 1 x daily - 7 x weekly - 1 sets - 3 reps - 30 hold - Heel Raises with Counter Support  - 1 x daily - 3 x weekly - 3 sets - 10 reps - Ankle Eversion with Resistance  - 1 x daily - 3 x weekly - 3 sets - 10 reps - Standing Gastroc Stretch on Step  - 1 x daily - 7 x weekly - 1 sets - 3 reps - 30 hold - Single Leg Stance  - 1 x daily - 3 x weekly - 1 sets - 5 reps - 10 hold   PT Short Term Goals       PT SHORT TERM GOAL #1   Title Patient will demonstrate understanding of home exercise plan reporting good compliance     Baseline issued at eval    Time 4    Period Weeks    Status New    Target Date 05/16/22      PT SHORT TERM GOAL #2   Title Pt to improve AMB tolerance to >526f without exacerbation of pain.    Baseline eval: 2054f   Time 4    Period Weeks    Status New    Target Date 05/16/22              PT Long Term Goals       PT LONG TERM GOAL #1   Title Patient will have improved function and activity level as evidenced by an increase in FOTO score by 10 points or more.    Baseline Eval: 47    Time 8    Period Weeks    Status New    Target Date 06/13/22      PT LONG TERM GOAL #2   Title Pt to demonstrate 5/5 strength in ankle DF, EV, IV left for improved motor control in gait.    Baseline eval: not tested    Time 8    Period Weeks    Status New    Target Date 06/13/22      PT LONG TERM GOAL #3   Title Pt will decrease worst pain as reported on NPRS by at least 2 points in order to demonstrate clinically significant reduction in pain.    Baseline eval: 6/10    Time 8    Period Weeks    Status New    Target Date 06/13/22      PT LONG  TERM GOAL #4   Title Pt to demonstrate 6MWT >1269f without exacerbation in pain to improve ability to AMB in community.    Baseline eval: 2060fwalk test (100073fn 2019)    Time 12    Period Weeks    Status New    Target Date 07/11/22              Plan      Clinical Impression Statement Pt continues to experience increase left achilles pain on heel cord directly above the left calcaneal that is constitently painful. Pt does not have strength deficits on left ankle. She does have increased muscular tension throughout left calf and it appears as if there is increased tension in posterior tib given PROM left ankle inversion. She has been scheduled with podiatrist for follow-up sooner to discuss increased pain. Soft tissue massage of left calf and soleus does appear to decrease pain slightly. She will continue to benefit from skilled  PT to reduce left achilles heel pain and increase achilles heel extensibility to return to walking and standing without pain or discomfort.       Personal Factors and Comorbidities Comorbidity 1;Time since onset of injury/illness/exacerbation;Past/Current Experience    Comorbidities TBI    Examination-Activity Limitations Locomotion Level    Examination-Participation Restrictions Other    Stability/Clinical Decision Making Evolving/Moderate complexity    Rehab Potential Good    Clinical Impairments Affecting Rehab Potential (-)TBI with resulting prolonged nature of abnormal gait, (-) right ankle contracture , (+) Motivation, strong family support    PT Frequency 2x / week    PT Duration 12 weeks    PT Treatment/Interventions Joint Manipulations;Taping;Splinting;Manual techniques;Orthotic Fit/Training;Neuromuscular re-education;Therapeutic activities;Therapeutic exercise;Balance training;Gait training;Moist Heat;Traction;Aquatic Therapy;Cryotherapy;Dry needling;Passive range of motion    PT Next Visit Plan Soft tissue massage of soleus and calf. Ambulation and stairs    Consulted and Agree with Plan of Care Patient              DanBradly Chris, DPT  05/17/2022, 11:12 AM

## 2022-05-18 ENCOUNTER — Encounter: Payer: Medicare Other | Admitting: Physical Therapy

## 2022-05-22 ENCOUNTER — Telehealth: Payer: Self-pay | Admitting: Podiatry

## 2022-05-22 ENCOUNTER — Ambulatory Visit: Payer: Medicare Other | Admitting: Physical Therapy

## 2022-05-22 ENCOUNTER — Encounter: Payer: Self-pay | Admitting: Podiatry

## 2022-05-22 ENCOUNTER — Encounter: Payer: Self-pay | Admitting: Physical Therapy

## 2022-05-22 DIAGNOSIS — M79672 Pain in left foot: Secondary | ICD-10-CM | POA: Diagnosis not present

## 2022-05-22 DIAGNOSIS — R262 Difficulty in walking, not elsewhere classified: Secondary | ICD-10-CM | POA: Diagnosis not present

## 2022-05-22 DIAGNOSIS — M79671 Pain in right foot: Secondary | ICD-10-CM | POA: Diagnosis not present

## 2022-05-22 MED ORDER — TRAMADOL HCL 50 MG PO TABS: 50.0000 mg | ORAL_TABLET | Freq: Four times a day (QID) | ORAL | 0 refills | Status: AC | PRN

## 2022-05-22 NOTE — Telephone Encounter (Signed)
Please advise 

## 2022-05-22 NOTE — Telephone Encounter (Signed)
Pt called and has put a message thru my chart as well. She is having a lot of calf pain and is wanting to be seen sooner or if you could call her in some pain medication. Her appt is now been moved to 6.14.23. Not sure if you wanted to send any medication in.

## 2022-05-22 NOTE — Therapy (Signed)
OUTPATIENT PHYSICAL THERAPY TREATMENT NOTE   Patient Name: Margaret Zuniga MRN: [id number] DOB:[birth year], 41 y.o., female Today's Date: 05/22/2022  PCP: Dr. Derrel Nip  REFERRING PROVIDER: Dr. Sherryle Lis    PT End of Session - 05/22/22 1157     Visit Number 8    Number of Visits 16    Date for PT Re-Evaluation 07/11/22    Authorization Type [employer] Dual Compl/Caid    Authorization Time Period 04/18/22-07/11/22    Progress Note Due on Visit 10    PT Start Time 1150    PT Stop Time 1230    PT Time Calculation (min) 40 min    Equipment Utilized During Treatment Gait belt    Activity Tolerance Patient limited by pain    Behavior During Therapy WFL for tasks assessed/performed             Past Medical History:  Diagnosis Date   Bilateral nephrolithiasis 03/24/2018   Essential hypertension, benign 04/04/2016   Extrinsic asthma 08/15/2016   Headache due to trauma    chronic, takes, NSAIDs , imipramine, muscle relaxers (failed [facility])   History of kidney stones    Hypertension    Major depressive disorder, recurrent episode, moderate ([place]) 05/21/2013   Obesity (BMI 30.0-34.9) 04/11/2017   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 Marry Guan   Past Surgical History:  Procedure Laterality Date   ELBOW SURGERY Right [place] LITHOTRIPSY Left 01/22/2020   Procedure: EXTRACORPOREAL SHOCK WAVE LITHOTRIPSY (ESWL);  Surgeon: Abbie Sons, MD;  Location: [facility] ORS;  Service: Urology;  Laterality: Left;   EYE SURGERY  1995   KNEE ARTHROSCOPY WITH LATERAL MENISECTOMY Right 03/04/2020   Procedure: KNEE ARTHROSCOPY WITH PARTIAL LATERAL MENISECTOMY;  Surgeon: Hessie Knows, MD;  Location: [facility];  Service: Orthopedics;  Laterality: Right;   LEG SURGERY  1985   SHOULDER SURGERY     SUBACROMIAL DECOMPRESSION  2000   Right  shoulder, [place]   TONSILLECTOMY  2001   Patient Active Problem List   Diagnosis Date Noted   Antibiotic-induced yeast infection 12/13/2021   Sore throat 12/13/2021   Gastroesophageal reflux disease without esophagitis 12/13/2021   Sub-Achilles bursitis, left 09/07/2021   Chronic right flank pain 06/24/2021   Acute pain of left shoulder 10/19/2020   Abnormal gait 10/19/2020   Recurrent falls 10/19/2020   Chronic intractable headache 10/19/2020   Ganglion cyst 10/19/2020   Baker's cyst of knee, right 10/19/2020   Depo-Provera contraceptive status 02/11/2020   Hydronephrosis concurrent with and due to calculi of kidney and ureter 01/16/2020   Paralysis ([place]) 12/18/2019   Bilateral leg weakness 10/21/2019   Depression 06/19/2019   Acute right ankle pain 06/03/2019   Educated about COVID-19 virus infection 06/03/2019   Nephrolithiasis 03/24/2018   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   Obesity, Class II, BMI 35-39.9 04/11/2017   Elevated liver enzymes 04/11/2017   Extrinsic asthma 08/15/2016   Solitary pulmonary nodule 07/03/2016   Essential hypertension, benign 04/04/2016   H/O varicella 08/19/2015   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   Deformity, finger, 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   Bilateral thoracic back pain 04/21/2014   Encounter for preventive health examination 04/21/2014   Major depressive disorder, recurrent episode, moderate ([place]) 05/21/2013   Posterior right knee pain 03/26/2013   Personal history of traumatic brain  injury    Eustachian tube dysfunction, bilateral 05/05/2012   Intractable chronic post-traumatic headache 02/14/2012    REFERRING DIAG: S/p left topaz procedure for left heel cord lengthening.  THERAPY DIAG:  Pain in left foot  Difficulty in walking, not elsewhere classified  PERTINENT HISTORY:   Per [employer] Note on 03/30/22   Routine Post Op       POV #1 DOS 03/24/22 --- LENGTHENING OF CALF MUSCLE, TOPAZ PROCEDUREACHILLES, PLANTAR FASCIA AND PERONEAL  TENDONS, PRP INJECTION ---- MCDONALD PT      DOS: 03/24/2022 Procedure: Lengthening of the calf muscle and Topaz procedure   41 y.o. female returns for post-op check.  Patient states that she is doing well.  Minimal pain.  She denies any other acute complaint bandages clean dry and intact.   PRECAUTIONS: CAM boot while walking until told otherwise   SUBJECTIVE: Pt continues to feel worsening right calf pain. She is scheduled to follow up with podiatrist in 10 days.  PAIN:  Are you having pain? Yes: NPRS scale: 10/10 Pain location: left calf  Pain description: Dull  Aggravating factors: Moving foot and placing weight on right foot  Relieving factors: keeping foot still   OBJECTIVE:    Observation/Other Assessments     Focus on Therapeutic Outcomes (FOTO)  47          AROM    Right/Left Ankle Left     Left Ankle Dorsiflexion 9   4 degrees on 3/23 presurgical    Left Ankle Plantar Flexion 48          PROM    PROM Assessment Site Ankle     Right/Left Ankle Left     Left Ankle Dorsiflexion 27   0 degrees 3/23 presurgical    Left Ankle Plantar Flexion 50     Left Ankle Inversion 50   47 3/23 presurgical    Left Ankle Eversion 24   15 3/23 presurgical         Ambulation/Gait    Ambulation Distance (Feet) 200 Feet   cut of after 264f (pt not in her CAM rocker as ordered)    Assistive device --   swedish knee cage    Gait velocity 0.765m     Gait Comments pain remains stable; pt does not have her [place] on this date to facilitate examination      TODAY'S TREATMENT:  05/22/22  MANUAL THERAPY  Calf Trigger Point Release and Soft Tissue Massage  Posterior Tibialis Trigger Point Release and Soft Tissue Massage   Resting position of left ankle is inversion pain with PROM DF    THEREX  PF with Blue TB 3 x 10     05/17/22 Matrix Recumbent Bicycle 5 min at resistance of 2  Prostretcher 3 x 30 sec on LLE for calf stretch  Long sitting soleus Stretch on LLE  3 x 30 sec    Ankle MMT  Inversion R/L 1/5 Eversion R/L 1/5 DF  R/L 1/5 PF R/L 1/5   Ankle ROM  -Left ankle is 25 deg inversion PROM   MANUAL   Soft tissue massage and trigger point release of left calf  -Pt reports decreased pain 8/10 NPS after massage   05/15/22 Matrix Recumbent Bicycle 5 min at resistance of 1   Hip MMT Flex R/L 4+/5  Abduct R/L 4+/5  Add R/L 4/4  Ext R/L 5/5   Supine Soleus Stretch 3 x 30 sec   Reverse Lunges on sliders with BUE support  3 x 10  -min VC to maintain heel flat on ground for increased calf stretch  -Experiencing 9/10 pain              Standing Abduction and Adduction with BUE support 3 x 10    05/10/22  THEREX   BAPS L5 board PF/DF 3 x 10  BAPS L5 board Inv/Ev 3 x 10  Partial Lunge with BUE support with RLE forward 3 x 10  Left Calf Stretch off of a 5 inch step 5 x 30 sec  -7/10 for NPS  Left Soleus Stretch on wall 5 x 30 sec  Single Leg Stance on LLE 10 x 5 sec holds   MANUAL  Right Calf Stretch in sitting 5 x 30 sec  -7/10 for NPS  Right heel cord and calf MFR       PATIENT EDUCATION: Education details: Form and technique for appropriate exercise  Person educated: Patient Education method: Explanation, Demonstration, Verbal cues, and Handouts Education comprehension: verbalized understanding, returned demonstration, tactile cues required, and needs further education   HOME EXERCISE PROGRAM: Access Code: [email or web address] URL: [email or web address] Date: 05/22/2022 Prepared by: Bradly Chris  Exercises - Long Sitting Ankle Plantar Flexion with Resistance  - 1 x daily - 3 x weekly - 3 sets - 10 reps - Ankle Eversion with Resistance  - 1 x daily - 3 x weekly - 3 sets - 10 reps - Long Sitting Calf Stretch with Strap  - 1 x daily - 7 x weekly - 1 sets - 3 reps - 60 hold - Long Sitting Soleus Stretch on Bolster with Strap  - 1 x daily - 7 x weekly - 1 sets - 3 reps - 60 hold   PT Short Term Goals       PT SHORT TERM  GOAL #1   Title Patient will demonstrate understanding of home exercise plan reporting good compliance    Baseline issued at eval    Time 4    Period Weeks    Status New    Target Date 05/16/22      PT SHORT TERM GOAL #2   Title Pt to improve AMB tolerance to >521f without exacerbation of pain.    Baseline eval: 2054f   Time 4    Period Weeks    Status New    Target Date 05/16/22              PT Long Term Goals       PT LONG TERM GOAL #1   Title Patient will have improved function and activity level as evidenced by an increase in FOTO score by 10 points or more.    Baseline Eval: 47    Time 8    Period Weeks    Status New    Target Date 06/13/22      PT LONG TERM GOAL #2   Title Pt to demonstrate 5/5 strength in ankle DF, EV, IV left for improved motor control in gait.    Baseline eval: not tested    Time 8    Period Weeks    Status New    Target Date 06/13/22      PT LONG TERM GOAL #3   Title Pt will decrease worst pain as reported on NPRS by at least 2 points in order to demonstrate clinically significant reduction in pain.    Baseline eval: 6/10    Time 8    Period Weeks    Status New  Target Date 06/13/22      PT LONG TERM GOAL #4   Title Pt to demonstrate 6MWT >1232f without exacerbation in pain to improve ability to AMB in community.    Baseline eval: 2084fwalk test (100097fn 2019)    Time 12    Period Weeks    Status New    Target Date 07/11/22              Plan      Clinical Impression Statement Pt continues to be limited by left ankle pain along heel cord and posterior tib tendons. Pain worsens with weight bearing activity, so exercises modified to non-weight bearing activity to avoid exacerbation of pain. She was able to perform exercises without increasing pain. Soft tissue did not reduce pain level. Will determine whether to pause sessions during next session for further evaluation and treatment by podiatrist based on her response to  modifications to her HEP.      Personal Factors and Comorbidities Comorbidity 1;Time since onset of injury/illness/exacerbation;Past/Current Experience    Comorbidities TBI    Examination-Activity Limitations Locomotion Level    Examination-Participation Restrictions Other    Stability/Clinical Decision Making Evolving/Moderate complexity    Rehab Potential Good    Clinical Impairments Affecting Rehab Potential (-)TBI with resulting prolonged nature of abnormal gait, (-) right ankle contracture , (+) Motivation, strong family support    PT Frequency 2x / week    PT Duration 12 weeks    PT Treatment/Interventions Joint Manipulations;Taping;Splinting;Manual techniques;Orthotic Fit/Training;Neuromuscular re-education;Therapeutic activities;Therapeutic exercise;Balance training;Gait training;Moist Heat;Traction;Aquatic Therapy;Cryotherapy;Dry needling;Passive range of motion    PT Next Visit Plan Soft tissue massage of soleus and calf. Continue to progress non-weight bearing exercises   Consulted and Agree with Plan of Care Patient              DanBradly Chris, DPT  05/22/2022, 12:35 PM

## 2022-05-23 ENCOUNTER — Encounter: Payer: Medicare Other | Admitting: Physical Therapy

## 2022-05-23 DIAGNOSIS — M542 Cervicalgia: Secondary | ICD-10-CM | POA: Diagnosis not present

## 2022-05-23 DIAGNOSIS — G518 Other disorders of facial nerve: Secondary | ICD-10-CM | POA: Diagnosis not present

## 2022-05-23 DIAGNOSIS — M791 Myalgia, unspecified site: Secondary | ICD-10-CM | POA: Diagnosis not present

## 2022-05-23 DIAGNOSIS — G43719 Chronic migraine without aura, intractable, without status migrainosus: Secondary | ICD-10-CM | POA: Diagnosis not present

## 2022-05-24 ENCOUNTER — Ambulatory Visit (INDEPENDENT_AMBULATORY_CARE_PROVIDER_SITE_OTHER): Payer: Medicare Other | Admitting: Podiatry

## 2022-05-24 ENCOUNTER — Encounter: Payer: Self-pay | Admitting: Physical Therapy

## 2022-05-24 ENCOUNTER — Ambulatory Visit: Payer: Medicare Other | Admitting: Physical Therapy

## 2022-05-24 DIAGNOSIS — M79672 Pain in left foot: Secondary | ICD-10-CM | POA: Diagnosis not present

## 2022-05-24 DIAGNOSIS — M7662 Achilles tendinitis, left leg: Secondary | ICD-10-CM

## 2022-05-24 DIAGNOSIS — M79671 Pain in right foot: Secondary | ICD-10-CM | POA: Diagnosis not present

## 2022-05-24 DIAGNOSIS — R262 Difficulty in walking, not elsewhere classified: Secondary | ICD-10-CM

## 2022-05-24 DIAGNOSIS — M7672 Peroneal tendinitis, left leg: Secondary | ICD-10-CM

## 2022-05-24 NOTE — Therapy (Signed)
OUTPATIENT PHYSICAL THERAPY TREATMENT NOTE   Patient Name: Margaret Zuniga MRN: [id number] DOB:[birth year], 41 y.o., female Today's Date: 05/24/2022  PCP: Dr. Derrel Nip  REFERRING PROVIDER: Dr. Sherryle Lis    PT End of Session - 05/24/22 1339     Visit Number 9    Number of Visits 16    Date for PT Re-Evaluation 07/11/22    Authorization Type UHC Dual Compl/Caid    Authorization Time Period 04/18/22-07/11/22    Progress Note Due on Visit 10    PT Start Time 1335    PT Stop Time 1415    PT Time Calculation (min) 40 min    Equipment Utilized During Treatment Gait belt    Activity Tolerance Patient limited by pain    Behavior During Therapy WFL for tasks assessed/performed             Past Medical History:  Diagnosis Date   Bilateral nephrolithiasis 03/24/2018   Essential hypertension, benign 04/04/2016   Extrinsic asthma 08/15/2016   Headache due to trauma    chronic, takes, NSAIDs , imipramine, muscle relaxers (failed [facility])   History of kidney stones    Hypertension    Major depressive disorder, recurrent episode, moderate ([place]) 05/21/2013   Obesity (BMI 30.0-34.9) 04/11/2017   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 Marry Guan   Past Surgical History:  Procedure Laterality Date   ELBOW SURGERY Right [place] LITHOTRIPSY Left 01/22/2020   Procedure: EXTRACORPOREAL SHOCK WAVE LITHOTRIPSY (ESWL);  Surgeon: Abbie Sons, MD;  Location: [facility];  Service: Urology;  Laterality: Left;   EYE SURGERY  1995   KNEE ARTHROSCOPY WITH LATERAL MENISECTOMY Right 03/04/2020   Procedure: KNEE ARTHROSCOPY WITH PARTIAL LATERAL MENISECTOMY;  Surgeon: Hessie Knows, MD;  Location: [facility];  Service: Orthopedics;  Laterality: Right;   LEG SURGERY  1985   SHOULDER SURGERY     SUBACROMIAL DECOMPRESSION  2000   Right  shoulder, [place]   TONSILLECTOMY  2001   Patient Active Problem List   Diagnosis Date Noted   Antibiotic-induced yeast infection 12/13/2021   Sore throat 12/13/2021   Gastroesophageal reflux disease without esophagitis 12/13/2021   Sub-Achilles bursitis, left 09/07/2021   Chronic right flank pain 06/24/2021   Acute pain of left shoulder 10/19/2020   Abnormal gait 10/19/2020   Recurrent falls 10/19/2020   Chronic intractable headache 10/19/2020   Ganglion cyst 10/19/2020   Baker's cyst of knee, right 10/19/2020   Depo-Provera contraceptive status 02/11/2020   Hydronephrosis concurrent with and due to calculi of kidney and ureter 01/16/2020   Paralysis ([place]) 12/18/2019   Bilateral leg weakness 10/21/2019   Depression 06/19/2019   Acute right ankle pain 06/03/2019   Educated about COVID-19 virus infection 06/03/2019   Nephrolithiasis 03/24/2018   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   Obesity, Class II, BMI 35-39.9 04/11/2017   Elevated liver enzymes 04/11/2017   Extrinsic asthma 08/15/2016   Solitary pulmonary nodule 07/03/2016   Essential hypertension, benign 04/04/2016   H/O varicella 08/19/2015   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  THERAPY DIAG:  Pain in left foot  Difficulty in walking, not elsewhere classified  PERTINENT HISTORY:   Per [employer] Note on 03/30/22   Routine Post Op       POV #1 DOS 03/24/22 --- LENGTHENING OF CALF MUSCLE, TOPAZ PROCEDUREACHILLES, PLANTAR FASCIA AND PERONEAL  TENDONS, PRP INJECTION ---- [facility]ALD PT      DOS: 03/24/2022 Procedure: Lengthening of the calf muscle and Topaz procedure   41 y.o. female returns for post-op check.  Patient states that she is doing well.  Minimal pain.  She denies any other acute complaint bandages clean dry and intact.   PRECAUTIONS: CAM boot while walking until told otherwise   SUBJECTIVE: Pt continues to feel worsening right calf pain. She is scheduled to follow up with podiatrist in 10 days.  PAIN:  Are you having pain? Yes: NPRS scale: 10/10 Pain location: left calf  Pain description: Dull  Aggravating factors: Moving foot and placing weight on right foot  Relieving factors: keeping foot still   OBJECTIVE:    Observation/Other Assessments     Focus on Therapeutic Outcomes (FOTO)  47          AROM    Right/Left Ankle Left     Left Ankle Dorsiflexion 9   4 degrees on 3/23 presurgical    Left Ankle Plantar Flexion 48          PROM    PROM Assessment Site Ankle     Right/Left Ankle Left     Left Ankle Dorsiflexion 27   0 degrees 3/23 presurgical    Left Ankle Plantar Flexion 50     Left Ankle Inversion 50   47 3/23 presurgical    Left Ankle Eversion 24   15 3/23 presurgical         Ambulation/Gait    Ambulation Distance (Feet) 200 Feet   cut of after 21f (pt not in her CAM rocker as ordered)    Assistive device --   swedish knee cage    Gait velocity 0.758m     Gait Comments pain remains stable; pt does not have her [place] on this date to facilitate examination      TODAY'S TREATMENT:  05/24/22 THEREX  Recumbent Bicycle Level 2 resistance for 5 min  Standing Dorsiflexion Self Mobilization on Step with BUE support 3 x 10   MANUAL  Soft tissue massage of calf Anterior Posterior Mobilization of Left Ankle 1 x 10  Stretch of gastroc 5 x 30 sec  Stretch of soleus 5 x 30 sec   05/22/22  MANUAL THERAPY  Calf Trigger Point Release and Soft Tissue Massage  Posterior Tibialis Trigger Point  Release and Soft Tissue Massage   Resting position of left ankle is inversion pain with PROM DF    THEREX  PF with Blue TB 3 x 10     05/17/22 Matrix Recumbent Bicycle 5 min at resistance of 2  Prostretcher 3 x 30 sec on LLE for calf stretch  Long sitting soleus Stretch on LLE  3 x 30 sec   Ankle MMT  Inversion R/L 1/5 Eversion R/L 1/5 DF  R/L 1/5 PF R/L 1/5   Ankle ROM  -Left ankle is 25 deg inversion PROM   MANUAL   Soft tissue massage and trigger point release of  left calf  -Pt reports decreased pain 8/10 NPS after massage     PATIENT EDUCATION: Education details: Form and technique for appropriate exercise  Person educated: Patient Education method: Explanation, Demonstration, Verbal cues, and Handouts Education comprehension: verbalized understanding, returned demonstration, tactile cues required, and needs further education   HOME EXERCISE PROGRAM: Access Code: [email or web address] URL: [email or web address] Date: 05/24/2022 Prepared by: Bradly Chris  Exercises - Long Sitting Ankle Plantar Flexion with Resistance  - 1 x daily - 3 x weekly - 3 sets - 10 reps - Ankle Eversion with Resistance  - 1 x daily - 3 x weekly - 3 sets - 10 reps - Long Sitting Calf Stretch with Strap  - 1 x daily - 7 x weekly - 1 sets - 3 reps - 60 hold - Long Sitting Soleus Stretch on Bolster with Strap  - 1 x daily - 7 x weekly - 1 sets - 3 reps - 60 hold - Standing Dorsiflexion Self-Mobilization on Step  - 1 x daily - 7 x weekly - 2 sets - 10 reps   PT Short Term Goals       PT SHORT TERM GOAL #1   Title Patient will demonstrate understanding of home exercise plan reporting good compliance    Baseline issued at eval    Time 4    Period Weeks    Status New    Target Date 05/16/22      PT SHORT TERM GOAL #2   Title Pt to improve AMB tolerance to >544f without exacerbation of pain.    Baseline eval: 2075f   Time 4    Period Weeks    Status New    Target Date 05/16/22               PT Long Term Goals       PT LONG TERM GOAL #1   Title Patient will have improved function and activity level as evidenced by an increase in FOTO score by 10 points or more.    Baseline Eval: 47    Time 8    Period Weeks    Status New    Target Date 06/13/22      PT LONG TERM GOAL #2   Title Pt to demonstrate 5/5 strength in ankle DF, EV, IV left for improved motor control in gait.    Baseline eval: not tested    Time 8    Period Weeks    Status New    Target Date 06/13/22      PT LONG TERM GOAL #3   Title Pt will decrease worst pain as reported on NPRS by at least 2 points in order to demonstrate clinically significant reduction in pain.    Baseline eval: 6/10    Time 8    Period Weeks    Status New    Target Date 06/13/22      PT LONG TERM GOAL #4   Title Pt to demonstrate 6MWT >120028fithout exacerbation in pain to improve ability to AMB in community.    Baseline eval: 200f4flk test ([id number])    Time 12    Period Weeks    Status New    Target Date 07/11/22              Plan      Clinical Impression Statement Session terminated earlier because of pt's increased heel cord pain. Continues to show muscular restrictions limiting left dorsiflexion. Incorporated self-mobilizations which patient  was able to tolerate for a short amount of time. She will be seeing podiatrist today for further evaluation and treatment of ongoing heel pain.      Personal Factors and Comorbidities Comorbidity 1;Time since onset of injury/illness/exacerbation;Past/Current Experience    Comorbidities TBI    Examination-Activity Limitations Locomotion Level    Examination-Participation Restrictions Other    Stability/Clinical Decision Making Evolving/Moderate complexity    Rehab Potential Good    Clinical Impairments Affecting Rehab Potential (-)TBI with resulting prolonged nature of abnormal gait, (-) right ankle contracture , (+) Motivation, strong family support     PT Frequency 2x / week    PT Duration 12 weeks    PT Treatment/Interventions Joint Manipulations;Taping;Splinting;Manual techniques;Orthotic Fit/Training;Neuromuscular re-education;Therapeutic activities;Therapeutic exercise;Balance training;Gait training;Moist Heat;Traction;Aquatic Therapy;Cryotherapy;Dry needling;Passive range of motion    PT Next Visit Plan Reassess goals. Soft tissue massage of soleus and calf. Continue to progress non-weight bearing exercises and right dorsiflexion mobilizations    Consulted and Agree with Plan of Care Patient              Bradly Chris PT, DPT  05/24/2022, 1:40 PM

## 2022-05-25 ENCOUNTER — Encounter: Payer: Medicare Other | Admitting: Physical Therapy

## 2022-05-26 ENCOUNTER — Ambulatory Visit: Payer: Medicare Other

## 2022-05-27 ENCOUNTER — Encounter: Payer: Self-pay | Admitting: Podiatry

## 2022-05-27 NOTE — Progress Notes (Signed)
  Subjective:  Patient ID: Margaret Zuniga, female    DOB: 15-May-1981,  MRN: [id number]  Chief Complaint  Patient presents with   Tendonitis    Achilles / peroneal tendonitis follow up after PT    41 y.o. female returns for post-op check.  Since last visit the Achilles tendon continues to worsen.  I spoke with her therapist this week and she is having quite a bit difficulty with this.  Review of Systems: Negative except as noted in the HPI. Denies N/V/F/Ch.   Objective:  There were no vitals filed for this visit. There is no height or weight on file to calculate BMI. Constitutional Well developed. Well nourished.  Vascular Foot warm and well perfused. Capillary refill normal to all digits.  Calf is soft and supple, no posterior calf or knee pain, negative Homans' sign  Neurologic Normal speech. Oriented to person, place, and time. Epicritic sensation to light touch grossly present bilaterally.  Dermatologic Skin is well-healed  Orthopedic: She has significant tenderness in the Achilles tendon midsubstance and insertion now, not on the peroneals good 5 out of 5 strength in plantarflexion and eversion   Assessment:   1. Tendonitis, Achilles, left   2. Peroneal tendinitis of left lower extremity    Plan:  Patient was evaluated and treated and all questions answered.  Unfortunately has continued to worsen.  I recommend she return back to the cam walker boot for immobilization and support.  She may continue therapy if it is tolerable but otherwise should rest.  I am ordering a new MRI of the Achilles it is been several months since the last 1 and this Achilles tendon pain was not present on her last examination and her MRI did not show any abnormalities here.  I did have PRP injected into the tendon during her procedure but unfortunately she has not improved with this.  She may require further surgical intervention.  She still has quite a bit of equinus that is a significant factor and tendo  Achilles lengthening may be necessary beyond the gastrocnemius recession we already did.  I would like to evaluate the tendon for any possible tearing or inflammation prior to considering this  Return for call to schedule after MRI to review.

## 2022-05-29 ENCOUNTER — Encounter: Payer: Medicare Other | Admitting: Physical Therapy

## 2022-05-30 ENCOUNTER — Telehealth: Payer: Self-pay | Admitting: Physical Therapy

## 2022-05-30 NOTE — Telephone Encounter (Signed)
Called pt about tomorrow's apt and recent apt with podiatrist. She would still like to come in tomorrow despite ongoing left Achilles heel pain and she will likely finish out the remainder of her apts.

## 2022-05-31 ENCOUNTER — Encounter: Payer: Self-pay | Admitting: Physical Therapy

## 2022-05-31 ENCOUNTER — Ambulatory Visit: Payer: Medicare Other | Admitting: Podiatry

## 2022-05-31 ENCOUNTER — Ambulatory Visit: Payer: Medicare Other | Admitting: Physical Therapy

## 2022-05-31 ENCOUNTER — Ambulatory Visit
Admission: RE | Admit: 2022-05-31 | Discharge: 2022-05-31 | Disposition: A | Discharge status: Home / Self Care | Payer: Medicare Other | Source: Ambulatory Visit | Attending: Podiatry | Admitting: Podiatry

## 2022-05-31 DIAGNOSIS — M7662 Achilles tendinitis, left leg: Secondary | ICD-10-CM | POA: Diagnosis not present

## 2022-05-31 DIAGNOSIS — R262 Difficulty in walking, not elsewhere classified: Secondary | ICD-10-CM

## 2022-05-31 DIAGNOSIS — M79671 Pain in right foot: Secondary | ICD-10-CM | POA: Diagnosis not present

## 2022-05-31 DIAGNOSIS — M722 Plantar fascial fibromatosis: Secondary | ICD-10-CM | POA: Diagnosis not present

## 2022-05-31 DIAGNOSIS — M79672 Pain in left foot: Secondary | ICD-10-CM

## 2022-05-31 DIAGNOSIS — M25472 Effusion, left ankle: Secondary | ICD-10-CM | POA: Diagnosis not present

## 2022-05-31 NOTE — Therapy (Signed)
OUTPATIENT PHYSICAL THERAPY PROGRESS NOTE   Patient Name: Margaret Zuniga MRN: [id number] DOB:[birth year], 41 y.o., female Today's Date: 05/31/2022  PCP: Dr. Derrel Nip  REFERRING PROVIDER: Dr. Sherryle Lis    PT End of Session - 05/31/22 1337     Visit Number 10    Number of Visits 16    Date for PT Re-Evaluation 07/11/22    Authorization Type UHC Dual Compl/Caid    Authorization Time Period 04/18/22-07/11/22    Progress Note Due on Visit 10    PT Start Time 1335    PT Stop Time 1415    PT Time Calculation (min) 40 min    Equipment Utilized During Treatment Gait belt    Activity Tolerance Patient limited by pain    Behavior During Therapy WFL for tasks assessed/performed             Past Medical History:  Diagnosis Date   Bilateral nephrolithiasis 03/24/2018   Essential hypertension, benign 04/04/2016   Extrinsic asthma 08/15/2016   Headache due to trauma    chronic, takes, NSAIDs , imipramine, muscle relaxers (failed [facility])   History of kidney stones    Hypertension    Major depressive disorder, recurrent episode, moderate ([place]) 05/21/2013   Obesity (BMI 30.0-34.9) 04/11/2017   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 Marry Guan   Past Surgical History:  Procedure Laterality Date   ELBOW SURGERY Right [place] LITHOTRIPSY Left 01/22/2020   Procedure: EXTRACORPOREAL SHOCK WAVE LITHOTRIPSY (ESWL);  Surgeon: Abbie Sons, MD;  Location: [facility] ORS;  Service: Urology;  Laterality: Left;   EYE SURGERY  1995   KNEE ARTHROSCOPY WITH LATERAL MENISECTOMY Right 03/04/2020   Procedure: KNEE ARTHROSCOPY WITH PARTIAL LATERAL MENISECTOMY;  Surgeon: Hessie Knows, MD;  Location: [facility] ORS;  Service: Orthopedics;  Laterality: Right;   LEG SURGERY  1985   SHOULDER SURGERY     SUBACROMIAL DECOMPRESSION  2000   Right  shoulder, [place]   TONSILLECTOMY  2001   Patient Active Problem List   Diagnosis Date Noted   Antibiotic-induced yeast infection 12/13/2021   Sore throat 12/13/2021   Gastroesophageal reflux disease without esophagitis 12/13/2021   Sub-Achilles bursitis, left 09/07/2021   Chronic right flank pain 06/24/2021   Acute pain of left shoulder 10/19/2020   Abnormal gait 10/19/2020   Recurrent falls 10/19/2020   Chronic intractable headache 10/19/2020   Ganglion cyst 10/19/2020   Baker's cyst of knee, right 10/19/2020   Depo-Provera contraceptive status 02/11/2020   Hydronephrosis concurrent with and due to calculi of kidney and ureter 01/16/2020   Paralysis ([place]) 12/18/2019   Bilateral leg weakness 10/21/2019   Depression 06/19/2019   Acute right ankle pain 06/03/2019   Educated about COVID-19 virus infection 06/03/2019   Nephrolithiasis 03/24/2018   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   Obesity, Class II, BMI 35-39.9 04/11/2017   Elevated liver enzymes 04/11/2017   Extrinsic asthma 08/15/2016   Solitary pulmonary nodule 07/03/2016   Essential hypertension, benign 04/04/2016   H/O varicella 08/19/2015   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   Deformity, finger, 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   Bilateral thoracic back pain 04/21/2014   Encounter for preventive health examination 04/21/2014   Major depressive disorder, recurrent episode, moderate ([place]) 05/21/2013   Posterior right knee pain 03/26/2013   Personal history of traumatic brain  injury    Eustachian tube dysfunction, bilateral 05/05/2012   Intractable chronic post-traumatic headache 02/14/2012    REFERRING DIAG: S/p left topaz procedure for left heel cord lengthening.  THERAPY DIAG:  Pain in left foot  Difficulty in walking, not elsewhere classified  PERTINENT HISTORY:   Per [facility] Note on 03/30/22   Routine Post Op       POV #1 DOS 03/24/22 --- LENGTHENING OF CALF MUSCLE, TOPAZ PROCEDUREACHILLES, PLANTAR FASCIA AND PERONEAL  TENDONS, PRP INJECTION ---- [facility] PT      DOS: 03/24/2022 Procedure: Lengthening of the calf muscle and Topaz procedure   41 y.o. female returns for post-op check.  Patient states that she is doing well.  Minimal pain.  She denies any other acute complaint bandages clean dry and intact.   PRECAUTIONS: CAM boot while walking until told otherwise   SUBJECTIVE: Pt has recently seen podiatrist who reports that pt needs further medical management and imaging given worsening left heel pain. She describes having pain even at rest and it remains about the same whether she is walking or not.    PAIN:  Are you having pain? Yes: NPRS scale: 10/10 Pain location: left calf  Pain description: Dull  Aggravating factors: Moving foot and placing weight on right foot  Relieving factors: keeping foot still   OBJECTIVE:    Observation/Other Assessments     Focus on Therapeutic Outcomes (FOTO)  47          AROM    Right/Left Ankle Left     Left Ankle Dorsiflexion 9   4 degrees on 3/23 presurgical    Left Ankle Plantar Flexion 48          PROM    PROM Assessment Site Ankle     Right/Left Ankle Left     Left Ankle Dorsiflexion 27   0 degrees 3/23 presurgical    Left Ankle Plantar Flexion 50     Left Ankle Inversion 50   47 3/23 presurgical    Left Ankle Eversion 24   15 3/23 presurgical         Ambulation/Gait    Ambulation Distance (Feet) 200 Feet   cut of after 294ft (pt not in her CAM rocker as ordered)    Assistive device --   swedish knee cage    Gait velocity 0.54m/s     Gait Comments pain remains stable; pt does not have her [place] AFO on this date to facilitate examination      TODAY'S TREATMENT:   05/31/22 BAPS Level 5  -DF/PF X 30  -Ev/Inv x 30  -Clockwise Circles x 30  -Counter Clockwise Circles x 30   Standing Heel Raises with 1 UE support  3 x 10  Standing Calf Stretch 3 x 30 sec  Standing Soleus Stretch 3 x 30 sec  SLS on LLE 5 sec hold x 10  SLS on LLE  6 inch  cone taps 1 x 10  SLS on LLE  6 inch x 2 cone taps 1 x 10    MMT (Left Side)  Ankle DF 5 Ankle PF 5  Ankle Inv 5  Ankle Ev 5   FOTO: 33/62      05/24/22 THEREX  Recumbent Bicycle Level 2 resistance for 5 min  Standing Dorsiflexion Self Mobilization on Step with BUE support 3 x 10   MANUAL  Soft tissue massage of calf Anterior Posterior Mobilization of Left Ankle 1 x 10  Stretch of gastroc 5 x  30 sec  Stretch of soleus 5 x 30 sec   05/22/22  MANUAL THERAPY  Calf Trigger Point Release and Soft Tissue Massage  Posterior Tibialis Trigger Point Release and Soft Tissue Massage   Resting position of left ankle is inversion pain with PROM DF    THEREX  PF with Blue TB 3 x 10        PATIENT EDUCATION: Education details: Form and technique for appropriate exercise  Person educated: Patient Education method: Explanation, Demonstration, Verbal cues, and Handouts Education comprehension: verbalized understanding, returned demonstration, tactile cues required, and needs further education   HOME EXERCISE PROGRAM: Access Code: [email or web address] URL: [email or web address] Date: 05/31/2022 Prepared by: Bradly Chris  Exercises - Standing Dorsiflexion Self-Mobilization on Step  - 1 x daily - 7 x weekly - 2 sets - 10 reps - Standing Heel Raise  - 1 x daily - 3 x weekly - 3 sets - 10 reps - Long Sitting Soleus Stretch on Bolster with Strap  - 1 x daily - 7 x weekly - 1 sets - 3 reps - 30 hold - Standing Gastroc Stretch on Step  - 1 x daily - 7 x weekly - 1 sets - 3 reps - 30 hold   PT Short Term Goals       PT SHORT TERM GOAL #1   Title Patient will demonstrate understanding of home exercise plan reporting good compliance    Baseline issued at eval    Time 4    Period Weeks    Status ACHIEVED    Target Date 05/16/22      PT SHORT TERM GOAL #2   Title Pt to improve AMB tolerance to >569ft without exacerbation of pain.    Baseline eval: 22ft 05/31/22: Feels  constant pain    Time 4    Period Weeks    Status  Not Met    Target Date 05/16/22              PT Long Term Goals       PT LONG TERM GOAL #1   Title Patient will have improved function and activity level as evidenced by an increase in FOTO score by 10 points or more.    Baseline Eval: 47 05/31/22: 33    Time 8    Period Weeks    Status ONGOING    Target Date 06/13/22      PT LONG TERM GOAL #2   Title Pt to demonstrate 5/5 strength in ankle DF, EV, IV left for improved motor control in gait.    Baseline eval: not tested 05/31/22 5/5 for all movements    Time 8    Period Weeks    Status ACHIEVED    Target Date 06/13/22      PT LONG TERM GOAL #3   Title Pt will decrease worst pain as reported on NPRS by at least 2 points in order to demonstrate clinically significant reduction in pain.    Baseline eval: 6/10 05/31/22: NPS 10/10    Time 8    Period Weeks    Status  ONGOING    Target Date 06/13/22      PT LONG TERM GOAL #4   Title Pt to demonstrate 6MWT >127ft without exacerbation in pain to improve ability to AMB in community.    Baseline eval: 235ft walk test (1084ft in 2019) 05/31/22: Not tested because pain 10/10   Time 12    Period Weeks    Status ONGOING  Target Date 07/11/22              Plan      Clinical Impression Statement Pt exhibits improvement with ankle strength, but no progress with ability to reduce pain and a reduction in her perception of her functional ability. Despite ongoing increased pain, pt able to perform all exercises without an increase in her pain. She exhibits improvement with weight bearing with ability to achieve single leg stance and standing heel raises. Will continue to progress within pain tolerance or until advised not to by podiatrist which she will be following up with next week.     Personal Factors and Comorbidities Comorbidity 1;Time since onset of injury/illness/exacerbation;Past/Current Experience    Comorbidities TBI     Examination-Activity Limitations Locomotion Level    Examination-Participation Restrictions Other    Stability/Clinical Decision Making Evolving/Moderate complexity    Rehab Potential Good    Clinical Impairments Affecting Rehab Potential (-)TBI with resulting prolonged nature of abnormal gait, (-) right ankle contracture , (+) Motivation, strong family support    PT Frequency 2x / week    PT Duration 12 weeks    PT Treatment/Interventions Joint Manipulations;Taping;Splinting;Manual techniques;Orthotic Fit/Training;Neuromuscular re-education;Therapeutic activities;Therapeutic exercise;Balance training;Gait training;Moist Heat;Traction;Aquatic Therapy;Cryotherapy;Dry needling;Passive range of motion    PT Next Visit Plan Continue with single leg and standing balance exercises    Consulted and Agree with Plan of Care Patient              Bradly Chris PT, DPT  05/31/2022, 1:38 PM

## 2022-06-05 ENCOUNTER — Encounter: Payer: Self-pay | Admitting: Physical Therapy

## 2022-06-05 ENCOUNTER — Ambulatory Visit (INDEPENDENT_AMBULATORY_CARE_PROVIDER_SITE_OTHER): Payer: Medicare Other | Admitting: Obstetrics and Gynecology

## 2022-06-05 ENCOUNTER — Ambulatory Visit (INDEPENDENT_AMBULATORY_CARE_PROVIDER_SITE_OTHER): Payer: Medicare Other | Admitting: Podiatry

## 2022-06-05 ENCOUNTER — Ambulatory Visit: Payer: Medicare Other | Admitting: Physical Therapy

## 2022-06-05 DIAGNOSIS — Z3042 Encounter for surveillance of injectable contraceptive: Secondary | ICD-10-CM | POA: Diagnosis not present

## 2022-06-05 DIAGNOSIS — M79672 Pain in left foot: Secondary | ICD-10-CM

## 2022-06-05 DIAGNOSIS — M79671 Pain in right foot: Secondary | ICD-10-CM | POA: Diagnosis not present

## 2022-06-05 DIAGNOSIS — R262 Difficulty in walking, not elsewhere classified: Secondary | ICD-10-CM | POA: Diagnosis not present

## 2022-06-05 DIAGNOSIS — M7662 Achilles tendinitis, left leg: Secondary | ICD-10-CM

## 2022-06-05 MED ORDER — MEDROXYPROGESTERONE ACETATE 150 MG/ML IM SUSP
150.0000 mg | Freq: Once | INTRAMUSCULAR | Status: AC
  Administered 2022-06-05: 150 mg via INTRAMUSCULAR

## 2022-06-05 MED ORDER — METHYLPREDNISOLONE 4 MG PO TBPK: ORAL_TABLET | ORAL | 0 refills | Status: DC

## 2022-06-05 NOTE — Progress Notes (Signed)
Last Depo-Provera: 03/10/22. Depo-Provera 150 mg IM given by: Peggye Pitt, CMA. Next appointment due SEPT 11-25 2023.  Pt presents for routine depo injection, patient tolerated well and reports no previous allergies.

## 2022-06-07 ENCOUNTER — Ambulatory Visit: Payer: Medicare Other | Admitting: Physical Therapy

## 2022-06-08 NOTE — Progress Notes (Signed)
  Subjective:  Patient ID: Margaret Zuniga, female    DOB: 1981-07-15,  MRN: [id number]  Chief Complaint  Patient presents with   Tendonitis    Follow up after MRI    41 y.o. female returns for post-op check.  She returns for follow-up after the MRI Review of Systems: Negative except as noted in the HPI. Denies N/V/F/Ch.   Objective:  There were no vitals filed for this visit. There is no height or weight on file to calculate BMI. Constitutional Well developed. Well nourished.  Vascular Foot warm and well perfused. Capillary refill normal to all digits.  Calf is soft and supple, no posterior calf or knee pain, negative Homans' sign  Neurologic Normal speech. Oriented to person, place, and time. Epicritic sensation to light touch grossly present bilaterally.  Dermatologic Skin is well-healed  Orthopedic: She has significant tenderness in the Achilles tendon midsubstance and insertion now, not on the peroneals good 5 out of 5 strength in plantarflexion and eversion    IMPRESSION: 1. Marked fusiform thickening of the Achilles tendon with intermediate intrasubstance signal consistent with tendinosis without discrete tear.   2.  Mild plantar fasciitis.   3.  Small tibiotalar joint effusion.   4.  No evidence of fracture or osteonecrosis.     Electronically Signed   By: Keane Police D.O.   On: 05/31/2022 23:12 Assessment:   1. Tendonitis, Achilles, left     Plan:  Patient was evaluated and treated and all questions answered.  We reviewed the results of the MRI she has increased thickening of the Achilles tendon there is no discrete tear.  We discussed treatment of this further with minimally or noninvasive options such as PRP injection, EPAT for Topaz.  She feels that PRP or EPAT may be too expensive and Topaz may be the best procedure if we need to go this route.  For now continue to rest and do PT with a tall boot which was replaced because her short boot was defective.  I will  see her back in 1 month.  A prednisone taper refill was sent to her pharmacy.  No follow-ups on file.

## 2022-06-15 DIAGNOSIS — J45909 Unspecified asthma, uncomplicated: Secondary | ICD-10-CM | POA: Diagnosis not present

## 2022-06-15 DIAGNOSIS — R0683 Snoring: Secondary | ICD-10-CM | POA: Diagnosis not present

## 2022-06-22 ENCOUNTER — Encounter: Payer: Self-pay | Admitting: Obstetrics and Gynecology

## 2022-07-05 ENCOUNTER — Ambulatory Visit (INDEPENDENT_AMBULATORY_CARE_PROVIDER_SITE_OTHER): Payer: Medicare Other | Admitting: Podiatry

## 2022-07-05 DIAGNOSIS — M7662 Achilles tendinitis, left leg: Secondary | ICD-10-CM

## 2022-07-05 NOTE — Progress Notes (Signed)
  Subjective:  Patient ID: Margaret Zuniga, female    DOB: [birth year],  MRN: [id number]  Chief Complaint  Patient presents with   Tendonitis       1 month follow up achilles tendinitis left. She is feeling much better.    41 y.o. female returns for post-op check.  Says it is doing much better and has improved, turned a corner.   Review of Systems: Negative except as noted in the HPI. Denies N/V/F/Ch.   Objective:  There were no vitals filed for this visit. There is no height or weight on file to calculate BMI. Constitutional Well developed. Well nourished.  Vascular Foot warm and well perfused. Capillary refill normal to all digits.  Calf is soft and supple, no posterior calf or knee pain, negative Homans' sign  Neurologic Normal speech. Oriented to person, place, and time. Epicritic sensation to light touch grossly present bilaterally.  Dermatologic Skin is well-healed  Orthopedic: She has minimal tenderness in the Achilles tendon midsubstance and insertion now, not on the peroneals good 5 out of 5 strength in plantarflexion and eversion    IMPRESSION: 1. Marked fusiform thickening of the Achilles tendon with intermediate intrasubstance signal consistent with tendinosis without discrete tear.   2.  Mild plantar fasciitis.   3.  Small tibiotalar joint effusion.   4.  No evidence of fracture or osteonecrosis.     Electronically Signed   By: Keane Police D.O.   On: 05/31/2022 23:12 Assessment:   1. Tendonitis, Achilles, left     Plan:  Patient was evaluated and treated and all questions answered.  Overall doing well and better, she should conitnue WBAT in boot, may transition as tolerated, continue PT  Return in about 6 weeks (around 08/16/2022).

## 2022-07-05 NOTE — Patient Instructions (Signed)

## 2022-07-18 DIAGNOSIS — M542 Cervicalgia: Secondary | ICD-10-CM | POA: Diagnosis not present

## 2022-07-18 DIAGNOSIS — G43719 Chronic migraine without aura, intractable, without status migrainosus: Secondary | ICD-10-CM | POA: Diagnosis not present

## 2022-07-18 DIAGNOSIS — G518 Other disorders of facial nerve: Secondary | ICD-10-CM | POA: Diagnosis not present

## 2022-07-18 DIAGNOSIS — M791 Myalgia, unspecified site: Secondary | ICD-10-CM | POA: Diagnosis not present

## 2022-08-01 ENCOUNTER — Other Ambulatory Visit: Payer: Self-pay | Admitting: Internal Medicine

## 2022-08-09 ENCOUNTER — Ambulatory Visit
Admission: RE | Admit: 2022-08-09 | Discharge: 2022-08-09 | Disposition: A | Discharge status: Home / Self Care | Payer: Medicare Other | Source: Ambulatory Visit | Attending: Urology | Admitting: Urology

## 2022-08-09 ENCOUNTER — Ambulatory Visit (INDEPENDENT_AMBULATORY_CARE_PROVIDER_SITE_OTHER): Payer: Medicare Other | Admitting: Urology

## 2022-08-09 ENCOUNTER — Encounter: Payer: Self-pay | Admitting: Urology

## 2022-08-09 VITALS — BP 126/90 | HR 112 | Ht 62.0 in | Wt 200.0 lb

## 2022-08-09 DIAGNOSIS — R109 Unspecified abdominal pain: Secondary | ICD-10-CM

## 2022-08-09 DIAGNOSIS — N200 Calculus of kidney: Secondary | ICD-10-CM | POA: Insufficient documentation

## 2022-08-09 DIAGNOSIS — Z87442 Personal history of urinary calculi: Secondary | ICD-10-CM | POA: Diagnosis not present

## 2022-08-09 LAB — URINALYSIS, COMPLETE
Bilirubin, UA: NEGATIVE
Glucose, UA: NEGATIVE
Ketones, UA: NEGATIVE
Leukocytes,UA: NEGATIVE
Nitrite, UA: NEGATIVE
Protein,UA: NEGATIVE
Specific Gravity, UA: 1.025 (ref 1.005–1.030)
Urobilinogen, Ur: 0.2 mg/dL (ref 0.2–1.0)
pH, UA: 5.5 (ref 5.0–7.5)

## 2022-08-09 LAB — MICROSCOPIC EXAMINATION

## 2022-08-09 NOTE — Progress Notes (Signed)
08/09/2022 1:05 PM   [name] 11-Sep-1981 [id number]  Referring provider: Crecencio Mc, MD [place],  [place] [address]  Chief Complaint  Patient presents with   Nephrolithiasis    Urologic history: 1.  Nephrolithiasis -Left distal ureteral calculi 01/2020, declined ureteroscopy and opted for ESWL -Punctate left renal calculus on CT -Stone analysis mixed calcium oxalate/calcium phosphate (45/35/20)   HPI: 41 y.o. female presents for annual follow-up  Has been doing well however this morning has been complaining of pain in the left flank; mild-moderate severity; no aggravating or alleviating factors No bothersome LUTS Denies dysuria, gross hematuria Denies flank, abdominal or pelvic pain KUB performed today was reviewed and there are no calcifications overlying the renal outlines/expected course of the ureter suspicious for urinary tract stones  PMH: Past Medical History:  Diagnosis Date   Bilateral nephrolithiasis 03/24/2018   Essential hypertension, benign 04/04/2016   Extrinsic asthma 08/15/2016   Headache due to trauma    chronic, takes, NSAIDs , imipramine, muscle relaxers (failed [facility])   History of kidney stones    Hypertension    Major depressive disorder, recurrent episode, moderate ([place]) 05/21/2013   Obesity (BMI 30.0-34.9) 04/11/2017   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 Marry Guan    Surgical History: Past Surgical History:  Procedure Laterality Date   ELBOW SURGERY Right [place] LITHOTRIPSY Left 01/22/2020   Procedure: EXTRACORPOREAL SHOCK WAVE LITHOTRIPSY (ESWL);  Surgeon: Abbie Sons, MD;  Location: [facility] ORS;  Service: Urology;  Laterality: Left;   EYE SURGERY  1995   KNEE ARTHROSCOPY WITH LATERAL MENISECTOMY Right 03/04/2020   Procedure:  KNEE ARTHROSCOPY WITH PARTIAL LATERAL MENISECTOMY;  Surgeon: Hessie Knows, MD;  Location: [facility] ORS;  Service: Orthopedics;  Laterality: Right;   LEG SURGERY  1985   SHOULDER SURGERY     SUBACROMIAL DECOMPRESSION  2000   Right shoulder, [place]   TONSILLECTOMY  2001    Home Medications:  Allergies as of 08/09/2022       Reactions   Zonegran [zonisamide] Rash        Medication List        Accurate as of August 09, 2022  1:05 PM. If you have any questions, ask your nurse or doctor.          STOP taking these medications    cetirizine 10 MG tablet Commonly known as: ZYRTEC Stopped by: Abbie Sons, MD   ciprofloxacin 500 MG tablet Commonly known as: Cipro Stopped by: Abbie Sons, MD   cyclobenzaprine 5 MG tablet Commonly known as: FLEXERIL Stopped by: Abbie Sons, MD   fluconazole 150 MG tablet Commonly known as: DIFLUCAN Stopped by: Abbie Sons, MD   gabapentin 300 MG capsule Commonly known as: NEURONTIN Stopped by: Abbie Sons, MD   Lactulose 20 GM/30ML Soln Stopped by: Abbie Sons, MD   methylPREDNISolone 4 MG Tbpk tablet Commonly known as: MEDROL DOSEPAK Stopped by: Abbie Sons, MD       TAKE these medications    acetaminophen-caffeine 500-65 MG Tabs per tablet Commonly known as: EXCEDRIN TENSION HEADACHE Take 1 tablet by mouth in the morning, at noon, and at bedtime.   albuterol 108 (90 Base) MCG/ACT inhaler Commonly known as: VENTOLIN HFA Inhale 2  puffs into the lungs every 6 (six) hours as needed for wheezing or shortness of breath.   amLODipine 10 MG tablet Commonly known as: NORVASC TAKE 1 TABLET BY MOUTH EVERY DAY   baclofen 10 MG tablet Commonly known as: LIORESAL Take 10 mg by mouth 2 (two) times daily as needed.   Breo Ellipta 100-25 MCG/ACT Aepb Generic drug: fluticasone furoate-vilanterol Inhale into the lungs.   carbamazepine 300 MG 12 hr capsule Commonly known as: CARBATROL Take 300 mg by mouth  in the morning, at noon, and at bedtime.   CENTRUM WOMEN PO Take by mouth daily in the afternoon.   escitalopram 10 MG tablet Commonly known as: LEXAPRO TAKE 1 TABLET BY MOUTH EVERY DAY   fluticasone 50 MCG/ACT nasal spray Commonly known as: FLONASE Place 2 sprays into both nostrils daily.   MAGNESIUM PO Take 500 mg by mouth.   medroxyPROGESTERone 150 MG/ML injection Commonly known as: DEPO-PROVERA INJECT 1 ML (150 MG TOTAL) INTO THE MUSCLE EVERY 3 (THREE) MONTHS.   metoprolol succinate 100 MG 24 hr tablet Commonly known as: TOPROL-XL TAKE 1 TABLET BY MOUTH EVERY DAY WITH OR IMMEDIATELY FOLLOWING A MEAL   omeprazole 20 MG capsule Commonly known as: PRILOSEC Take 1 capsule (20 mg total) by mouth 2 (two) times daily before a meal.   Qulipta 60 MG Tabs Generic drug: Atogepant   Vitamin C 500 MG Caps Take 1 tablet by mouth.   Vitamin D3 25 MCG (1000 UT) Caps Take 1 capsule by mouth.   ZZZQUIL PO Take by mouth at bedtime.        Allergies:  Allergies  Allergen Reactions   Zonegran [Zonisamide] Rash    Family History: Family History  Problem Relation Age of Onset   Diabetes Mother    Coronary artery disease Mother    Hyperlipidemia Mother    Hypertension Mother    Parkinson's disease Mother    Heart disease Maternal Grandfather    Stroke Father     Social History:  reports that she has never smoked. She has never used smokeless tobacco. She reports that she does not drink alcohol and does not use drugs.   Physical Exam: BP (!) 126/90   Pulse (!) 112   Ht '5\' 2"'$  (1.575 m)   Wt 200 lb (90.7 kg)   BMI 36.58 kg/m   Constitutional:  Alert and oriented, No acute distress. HEENT: [place] AT, moist mucus membranes.  Trachea midline, no masses. Cardiovascular: No clubbing, cyanosis, or edema. Respiratory: Normal respiratory effort, no increased work of breathing.  Pertinent Imaging: KUB images were personally reviewed and interpreted   Assessment & Plan:     1.  Nephrolithiasis Asymptomatic, punctate calculus on CT No calculi visualized on KUB today Onset mild-moderate left flank pain this morning UA ordered and if microhematuria will order stone protocol CT.  If UA negative order RUS and will call with results   Abbie Sons, MD  [facility] [address] [place], [place] [address] [phone]

## 2022-08-09 NOTE — Progress Notes (Signed)
In and Out Catheterization  Patient is present today for a I & O catheterization due to UTI. Patient was cleaned and prepped in a sterile fashion with betadine . A 14FR cath was inserted no complications were noted , 27m of urine return was noted, urine was yellow in color. A clean urine sample was collected for UA. Bladder was drained  And catheter was removed with out difficulty.    Performed by: CElberta Leatherwood [place]

## 2022-08-10 ENCOUNTER — Telehealth: Payer: Self-pay | Admitting: *Deleted

## 2022-08-10 ENCOUNTER — Other Ambulatory Visit: Payer: Self-pay | Admitting: Urology

## 2022-08-10 DIAGNOSIS — Z87442 Personal history of urinary calculi: Secondary | ICD-10-CM

## 2022-08-10 DIAGNOSIS — R109 Unspecified abdominal pain: Secondary | ICD-10-CM

## 2022-08-10 DIAGNOSIS — R3129 Other microscopic hematuria: Secondary | ICD-10-CM

## 2022-08-10 NOTE — Telephone Encounter (Signed)
Notified patient as instructed, patient pleased. Discussed follow-up appointments, patient agrees  

## 2022-08-10 NOTE — Telephone Encounter (Signed)
-----   Message from Abbie Sons, MD sent at 08/10/2022 12:02 PM EDT ----- Urinalysis does show microscopic blood.  CT order placed and will call with results.

## 2022-08-16 ENCOUNTER — Ambulatory Visit (INDEPENDENT_AMBULATORY_CARE_PROVIDER_SITE_OTHER): Payer: Medicare Other | Admitting: Podiatry

## 2022-08-16 DIAGNOSIS — M7662 Achilles tendinitis, left leg: Secondary | ICD-10-CM | POA: Diagnosis not present

## 2022-08-16 NOTE — Progress Notes (Signed)
  Subjective:  Patient ID: Margaret Zuniga, female    DOB: 05-05-1981,  MRN: [id number]  Chief Complaint  Patient presents with   Tendonitis    Follow up achilles tendonitis left    41 y.o. female returns for post-op check.  Says it is still doing quite well, she does have some tenderness without her knee brace but with her knee brace and an [place] brace she is much better  Review of Systems: Negative except as noted in the HPI. Denies N/V/F/Ch.   Objective:  There were no vitals filed for this visit. There is no height or weight on file to calculate BMI. Constitutional Well developed. Well nourished.  Vascular Foot warm and well perfused. Capillary refill normal to all digits.  Calf is soft and supple, no posterior calf or knee pain, negative Homans' sign  Neurologic Normal speech. Oriented to person, place, and time. Epicritic sensation to light touch grossly present bilaterally.  Dermatologic Skin is well-healed  Orthopedic: She has minimal tenderness in the Achilles tendon midsubstance and insertion now, not on the peroneals good 5 out of 5 strength in plantarflexion and eversion    IMPRESSION: 1. Marked fusiform thickening of the Achilles tendon with intermediate intrasubstance signal consistent with tendinosis without discrete tear.   2.  Mild plantar fasciitis.   3.  Small tibiotalar joint effusion.   4.  No evidence of fracture or osteonecrosis.     Electronically Signed   By: Keane Police D.O.   On: 05/31/2022 23:12 Assessment:   1. Tendonitis, Achilles, left     Plan:  Patient was evaluated and treated and all questions answered.  Overall doing well she is to continue wearing her braces and I expect this should resolve uneventfully now.  Continue physical therapy until she is completed.  I will see her back.  At this point. Return if symptoms worsen or fail to improve.

## 2022-08-21 ENCOUNTER — Ambulatory Visit
Admission: RE | Admit: 2022-08-21 | Discharge: 2022-08-21 | Disposition: A | Discharge status: Home / Self Care | Payer: Medicare Other | Source: Ambulatory Visit | Attending: Urology | Admitting: Urology

## 2022-08-21 DIAGNOSIS — Z87442 Personal history of urinary calculi: Secondary | ICD-10-CM

## 2022-08-21 DIAGNOSIS — R3129 Other microscopic hematuria: Secondary | ICD-10-CM

## 2022-08-21 DIAGNOSIS — R109 Unspecified abdominal pain: Secondary | ICD-10-CM

## 2022-08-22 ENCOUNTER — Encounter: Payer: Self-pay | Admitting: *Deleted

## 2022-08-30 DIAGNOSIS — M791 Myalgia, unspecified site: Secondary | ICD-10-CM | POA: Diagnosis not present

## 2022-08-30 DIAGNOSIS — G518 Other disorders of facial nerve: Secondary | ICD-10-CM | POA: Diagnosis not present

## 2022-08-30 DIAGNOSIS — M542 Cervicalgia: Secondary | ICD-10-CM | POA: Diagnosis not present

## 2022-08-30 DIAGNOSIS — G43719 Chronic migraine without aura, intractable, without status migrainosus: Secondary | ICD-10-CM | POA: Diagnosis not present

## 2022-09-01 ENCOUNTER — Ambulatory Visit (INDEPENDENT_AMBULATORY_CARE_PROVIDER_SITE_OTHER): Payer: Medicare Other | Admitting: Obstetrics and Gynecology

## 2022-09-01 DIAGNOSIS — Z3042 Encounter for surveillance of injectable contraceptive: Secondary | ICD-10-CM | POA: Diagnosis not present

## 2022-09-01 MED ORDER — MEDROXYPROGESTERONE ACETATE 150 MG/ML IM SUSP
150.0000 mg | Freq: Once | INTRAMUSCULAR | Status: AC
  Administered 2022-09-01: 150 mg via INTRAMUSCULAR

## 2022-09-01 NOTE — Progress Notes (Signed)
Last Depo-Provera: 06/05/22. Side Effects if any: NONE Depo-Provera 150 mg IM given by: Peggye Pitt, CMA Pt presents for routine depo injection, injection tolerated well, no interactions noted. Pt to f/u in 3 months as scheduled for next injection. All questions answered. NEXT APPT DUE DEC 8 - DEC 22

## 2022-09-06 ENCOUNTER — Ambulatory Visit
Admission: EM | Admit: 2022-09-06 | Discharge: 2022-09-06 | Disposition: A | Discharge status: Home / Self Care | Payer: Medicare Other | Attending: Emergency Medicine | Admitting: Emergency Medicine

## 2022-09-06 DIAGNOSIS — J029 Acute pharyngitis, unspecified: Secondary | ICD-10-CM | POA: Diagnosis not present

## 2022-09-06 DIAGNOSIS — Z20822 Contact with and (suspected) exposure to covid-19: Secondary | ICD-10-CM | POA: Diagnosis not present

## 2022-09-06 DIAGNOSIS — U071 COVID-19: Secondary | ICD-10-CM | POA: Insufficient documentation

## 2022-09-06 LAB — RESP PANEL BY RT-PCR (FLU A&B, COVID) ARPGX2
Influenza A by PCR: NEGATIVE
Influenza B by PCR: NEGATIVE
SARS Coronavirus 2 by RT PCR: POSITIVE — AB

## 2022-09-06 LAB — POCT RAPID STREP A (OFFICE): Rapid Strep A Screen: NEGATIVE

## 2022-09-06 MED ORDER — AEROCHAMBER MV MISC: 1 refills | Status: DC

## 2022-09-06 NOTE — ED Provider Notes (Signed)
HPI  SUBJECTIVE:  Patient reports sore throat, left ear pain starting yesterday.  Headache is better with Excedrin.  No aggravating factors. No fever  No neck stiffness  + Very mild cough No wheezing.  No change in her baseline shortness of breath/dyspnea on exertion + nasal congestion, rhinorrhea, postnasal drip No Myalgias No Headache that is different from her baseline No Rash No change in hearing, otorrhea  No loss of taste or smell No shortness of breath or difficulty breathing No nausea, vomiting No diarrhea No abdominal pain     + Lives with father who currently has COVID.  No Recent Strep or flu exposure She got 3 doses of the COVID-vaccine.  No Breathing difficulty, voice changes, sensation of throat swelling shut No Drooling No Trismus No abx in past month.  + Took Excedrin in the past 6 hours She has a past medical history of asthma, states that she is compliant with her Breo.  She has not been using her albuterol.  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   LMP: 2016.  She is on Depo.  Denies the possibility being pregnant.   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 Marry Guan    Past Surgical History:  Procedure Laterality Date   ELBOW SURGERY Right [place] LITHOTRIPSY Left 01/22/2020   Procedure: EXTRACORPOREAL SHOCK WAVE LITHOTRIPSY (ESWL);  Surgeon: Abbie Sons, MD;  Location: [facility] ORS;  Service: Urology;  Laterality: Left;   EYE SURGERY  1995   KNEE ARTHROSCOPY WITH LATERAL MENISECTOMY Right 03/04/2020   Procedure: KNEE ARTHROSCOPY WITH PARTIAL LATERAL MENISECTOMY;  Surgeon: Hessie Knows, MD;  Location: [facility] OR[facility];  Service: Orthopedics;  Laterality: Right;   LEG SURGERY  1985   SHOULDER SURGERY     SUBACROMIAL DECOMPRESSION  2000   Right shoulder, [place]   TONSILLECTOMY  2001    Family History  Problem Relation Age of Onset   Diabetes Mother    Coronary artery disease Mother    Hyperlipidemia Mother    Hypertension Mother    Parkinson's disease Mother    Heart disease Maternal Grandfather    Stroke Father     Social History   Tobacco Use   Smoking status: Never   Smokeless tobacco: Never  Vaping Use   Vaping Use: Never used  Substance Use Topics   Alcohol use: No   Drug use: No    No current facility-administered medications for this encounter.  Current Outpatient Medications:    Spacer/Aero-Holding Chambers (AEROCHAMBER MV) inhaler, Use as instructed, Disp: 1 each, Rfl: 1   Acetaminophen-Caffeine 500-65 MG TABS, Take 1 tablet by mouth in the morning, at noon, and at bedtime. , Disp: , Rfl:    albuterol (VENTOLIN HFA) 108 (90 Base) MCG/ACT inhaler, Inhale 2 puffs into the  lungs every 6 (six) hours as needed for wheezing or shortness of breath., Disp: , Rfl:    amLODipine (NORVASC) 10 MG tablet, TAKE 1 TABLET BY MOUTH EVERY DAY, Disp: 90 tablet, Rfl: 1   Ascorbic Acid (VITAMIN C) 500 MG CAPS, Take 1 tablet by mouth., Disp: , Rfl:    Atogepant (QULIPTA) 60 MG TABS, , Disp: , Rfl:    baclofen (LIORESAL) 10 MG tablet, Take 10 mg by mouth 2 (two) times daily as needed., Disp: , Rfl:    carbamazepine (CARBATROL) 300 MG 12 hr capsule, Take 300 mg by mouth in the morning, at noon, and at bedtime. , Disp: , Rfl: 1    Cholecalciferol (VITAMIN D3) 25 MCG (1000 UT) CAPS, Take 1 capsule by mouth., Disp: , Rfl:    diphenhydrAMINE HCl, Sleep, (ZZZQUIL PO), Take by mouth at bedtime., Disp: , Rfl:    escitalopram (LEXAPRO) 10 MG tablet, TAKE 1 TABLET BY MOUTH EVERY DAY, Disp: 90 tablet, Rfl: 0   fluticasone (FLONASE) 50 MCG/ACT nasal spray, Place 2 sprays into both nostrils daily., Disp: 16 g, Rfl: 6   fluticasone furoate-vilanterol (BREO ELLIPTA) 100-25 MCG/INH AEPB, Inhale into the lungs., Disp: , Rfl:    MAGNESIUM PO, Take 500 mg by mouth., Disp: , Rfl:    medroxyPROGESTERone (DEPO-PROVERA) 150 MG/ML injection, INJECT 1 ML (150 MG TOTAL) INTO THE MUSCLE EVERY 3 (THREE) MONTHS., Disp: 1 mL, Rfl: 3   metoprolol succinate (TOPROL-XL) 100 MG 24 hr tablet, TAKE 1 TABLET BY MOUTH EVERY DAY WITH OR IMMEDIATELY FOLLOWING A MEAL, Disp: 90 tablet, Rfl: 0   Multiple Vitamins-Minerals (CENTRUM WOMEN PO), Take by mouth daily in the afternoon., Disp: , Rfl:    omeprazole (PRILOSEC) 20 MG capsule, Take 1 capsule (20 mg total) by mouth 2 (two) times daily before a meal., Disp: 180 capsule, Rfl: 3  Allergies  Allergen Reactions   Zonegran [Zonisamide] Rash     ROS  As noted in HPI.   Physical Exam  BP 128/82   Pulse (!) 116   Temp 98.1 F (36.7 C)   Resp 18   Ht '5\' 3"'$  (1.6 m)   Wt 90.7 kg   SpO2 97%   BMI 35.43 kg/m   Constitutional: Well developed, well nourished, no acute distress Eyes:  EOMI, conjunctiva normal bilaterally HENT: Normocephalic, atraumatic,mucus membranes moist.  Bilateral external ears, EACs, TMs normal.  No pain with traction of pinna, palpation of tragus left ear.  No tenderness, crepitus left TMJ.  Hearing grossly intact and equal bilaterally.  Positive clear nasal congestion, erythematous, swollen turbinates.  Erythematous oropharynx.  Tonsils surgically absent.  Uvula midline.  No obvious postnasal drip.    Respiratory: Normal inspiratory effort, lungs clear bilaterally Cardiovascular:  Regular tachycardia, no murmurs, rubs, gallops GI: nondistended, nontender. No appreciable splenomegaly skin: No rash, skin intact Lymph: Positive anterior cervical LN.  No posterior cervical lymphadenopathy Musculoskeletal: no deformities Neurologic: Alert & oriented x 3, no focal neuro deficits Psychiatric: Speech and behavior appropriate.   ED Course   Medications - No data to display  Orders Placed This Encounter  Procedures   Resp Panel by RT-PCR (Flu A&B, Covid) Anterior Nasal Swab    Standing Status:   Standing    Number of Occurrences:   1   Culture, group A strep    Standing Status:   Standing    Number of Occurrences:   1   POCT rapid strep A    Standing Status:   Standing  Number of Occurrences:   1    Results for orders placed or performed during the hospital encounter of 09/06/22 (from the past 24 hour(s))  Resp Panel by RT-PCR (Flu A&B, Covid) Anterior Nasal Swab     Status: Abnormal   Collection Time: 09/06/22 12:37 PM   Specimen: Anterior Nasal Swab  Result Value Ref Range   SARS Coronavirus 2 by RT PCR POSITIVE (A) NEGATIVE   Influenza A by PCR NEGATIVE NEGATIVE   Influenza B by PCR NEGATIVE NEGATIVE  POCT rapid strep A     Status: None   Collection Time: 09/06/22 12:45 PM  Result Value Ref Range   Rapid Strep A Screen Negative Negative   No results found.  ED Clinical Impression  1. COVID-19 virus infection   2. Sore throat   3. Acute pharyngitis, unspecified etiology   4. Close exposure to COVID-19 virus   5. Encounter for laboratory testing for COVID-19 virus      ED Assessment/Plan   Checking COVID, influenza.  Patient will be a candidate for antivirals due to history of asthma, hypertension.  Rapid strep negative. Obtaining throat culture to guide antibiotic treatment. Discussed this with patient We'll contact her if culture is positive, and will call in Appropriate antibiotics. Patient home with ibuprofen, Tylenol, Benadryl/Maalox  mixture.  Prescribing a spacer for her albuterol inhaler, feel that this will make a difference in her shortness of breath and cough which she states is not new or different since sore throat and ear pain started.  She is to start Flonase, Mucinex, saline nasal irrigation with a [employer] sinus rinse and distilled water as often as she want.   COVID is positive.  Influenza negative. GFR from labs done on 11/17/2293.  I called the patient and discussed the positive COVID result.  We went over Molnupiravir versus Paxlovid, and she has opted for Molnupiravir.  Will E prescribe Molnupiravir to pharmacy on record.  Advised patient to quarantine for 5 days since symptom onset and to mask for 5 days thereafter.  Follow-up with PCP as needed.  Discussed labs,  MDM, plan and followup with patient.  patient agrees with plan.   Meds ordered this encounter  Medications   Spacer/Aero-Holding Chambers (AEROCHAMBER MV) inhaler    Sig: Use as instructed    Dispense:  1 each    Refill:  1     *This clinic note was created using Dragon dictation software. Therefore, there may be occasional mistakes despite careful proofreading.     Melynda Ripple, MD 09/07/22 [phone]

## 2022-09-06 NOTE — ED Triage Notes (Signed)
Patient to Urgent Care with complaints of a sore throat for several days. Reports her dad is currently covid positive and she lives with him. Patient also reports left sided ear ache.

## 2022-09-06 NOTE — Discharge Instructions (Addendum)
Your COVID and flu will be back in the next 24 hours.  I will prescribe the appropriate antiviral if either one of them are positive.  Your rapid strep was negative today, so we have sent off a throat culture.  We will contact you and call in the appropriate antibiotics if your culture comes back positive for an infection requiring antibiotic treatment.  Give [place] a working phone number. 1 gram of Tylenol and 600 mg ibuprofen together 3-4 times a day as needed for pain.  Make sure you drink plenty of extra fluids.  Some people find salt water gargles and  Traditional Medicinal's "Throat Coat" tea helpful. Take 5 mL of liquid Benadryl and 5 mL of Maalox. Mix it together, and then hold it in your mouth for as long as you can and then swallow. You may do this 4 times a day.  Start Mucinex, saline nasal irrigation with a Milta Deiters Med rinse and distilled water, and start your Flonase.  2 puffs from your albuterol inhaler using your spacer every 4-6 hours as needed.  I think this will really help with your asthma  Go to www.goodrx.com  or www.costplusdrugs.com to look up your medications. This will give you a list of where you can find your prescriptions at the most affordable prices. Or ask the pharmacist what the cash price is, or if they have any other discount programs available to help make your medication more affordable. This can be less expensive than what you would pay with insurance.

## 2022-09-07 ENCOUNTER — Telehealth: Payer: Self-pay | Admitting: Emergency Medicine

## 2022-09-07 MED ORDER — MOLNUPIRAVIR EUA 200MG CAPSULE: 4.0000 | ORAL_CAPSULE | Freq: Two times a day (BID) | ORAL | 0 refills | Status: AC

## 2022-09-07 NOTE — Telephone Encounter (Signed)
Results for orders placed or performed during the hospital encounter of 09/06/22  Resp Panel by RT-PCR (Flu A&B, Covid) Anterior Nasal Swab   Specimen: Anterior Nasal Swab  Result Value Ref Range   SARS Coronavirus 2 by RT PCR POSITIVE (A) NEGATIVE   Influenza A by PCR NEGATIVE NEGATIVE   Influenza B by PCR NEGATIVE NEGATIVE  POCT rapid strep A  Result Value Ref Range   Rapid Strep A Screen Negative Negative     COVID-positive, influenza negative.  Discussed positive COVID result with patient over the phone. she has opted for molnupiravir.  Will e prescribe Molnupiravir to pharmacy on record.

## 2022-09-09 LAB — CULTURE, GROUP A STREP (THRC)

## 2022-10-11 DIAGNOSIS — G518 Other disorders of facial nerve: Secondary | ICD-10-CM | POA: Diagnosis not present

## 2022-10-11 DIAGNOSIS — G43719 Chronic migraine without aura, intractable, without status migrainosus: Secondary | ICD-10-CM | POA: Diagnosis not present

## 2022-10-11 DIAGNOSIS — M542 Cervicalgia: Secondary | ICD-10-CM | POA: Diagnosis not present

## 2022-10-11 DIAGNOSIS — M791 Myalgia, unspecified site: Secondary | ICD-10-CM | POA: Diagnosis not present

## 2022-10-12 ENCOUNTER — Ambulatory Visit
Admission: RE | Admit: 2022-10-12 | Discharge: 2022-10-12 | Disposition: A | Discharge status: Home / Self Care | Payer: Medicare Other | Source: Ambulatory Visit | Attending: Urgent Care | Admitting: Urgent Care

## 2022-10-12 VITALS — BP 139/97 | HR 86 | Temp 97.7°F | Resp 16

## 2022-10-12 DIAGNOSIS — R109 Unspecified abdominal pain: Secondary | ICD-10-CM

## 2022-10-12 LAB — POCT URINALYSIS DIP (MANUAL ENTRY)
Bilirubin, UA: NEGATIVE
Blood, UA: NEGATIVE
Glucose, UA: NEGATIVE mg/dL
Ketones, POC UA: NEGATIVE mg/dL
Leukocytes, UA: NEGATIVE
Nitrite, UA: NEGATIVE
Protein Ur, POC: NEGATIVE mg/dL
Spec Grav, UA: 1.02 (ref 1.010–1.025)
Urobilinogen, UA: 0.2 E.U./dL
pH, UA: 5.5 (ref 5.0–8.0)

## 2022-10-12 NOTE — Discharge Instructions (Addendum)
Follow-up by requesting evaluation in the emergency room if your symptoms persist or worsen.

## 2022-10-12 NOTE — ED Provider Notes (Signed)
[facility]    CSN: [id number] Arrival date & time: 10/12/22  1440      History   Chief Complaint Chief Complaint  Patient presents with   Flank Pain    Entered by patient    HPI Margaret Zuniga is a 41 y.o. female.    Flank Pain    Presents to UC with complaint of right-sided flank pain x2 days.  Pain is sharp and aggravated by movement and breathing.  PMH significant for nephrolithiasis.  Denies history of cholelithiasis.  Denies symptoms worsened or improved by eating.  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 Marry Guan    Patient Active Problem List   Diagnosis Date Noted   Antibiotic-induced yeast infection 12/13/2021   Sore throat 12/13/2021   Gastroesophageal reflux disease without esophagitis 12/13/2021   Sub-Achilles bursitis, left 09/07/2021   Chronic right flank pain 06/24/2021   Acute pain of left shoulder 10/19/2020   Abnormal gait 10/19/2020   Recurrent falls 10/19/2020   Chronic intractable headache 10/19/2020   Ganglion cyst 10/19/2020   Baker's cyst of knee, right 10/19/2020   Depo-Provera contraceptive status 02/11/2020   Hydronephrosis concurrent with and due to calculi of kidney and ureter 01/16/2020   Paralysis ([place]) 12/18/2019   Bilateral leg weakness 10/21/2019   Depression 06/19/2019   Acute right ankle pain 06/03/2019   Educated about COVID-19 virus infection 06/03/2019    Nephrolithiasis 03/24/2018   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   Obesity, Class II, BMI 35-39.9 04/11/2017   Elevated liver enzymes 04/11/2017   Extrinsic asthma 08/15/2016   Solitary pulmonary nodule 07/03/2016   Essential hypertension, benign 04/04/2016   H/O varicella 08/19/2015   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   Deformity, finger, 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   Bilateral thoracic back pain 04/21/2014   Encounter for preventive health examination 04/21/2014   Major depressive disorder, recurrent episode, moderate ([place]) 05/21/2013   Posterior right knee pain 03/26/2013   Personal history of traumatic brain injury    Eustachian tube dysfunction, bilateral 05/05/2012   Intractable chronic post-traumatic headache 02/14/2012    Past Surgical History:  Procedure Laterality Date   ELBOW SURGERY Right [place] LITHOTRIPSY Left 01/22/2020   Procedure: EXTRACORPOREAL SHOCK WAVE LITHOTRIPSY (ESWL);  Surgeon: Abbie Sons, MD;  Location: [facility];  Service: Urology;  Laterality: Left;   EYE SURGERY  1995   KNEE ARTHROSCOPY WITH LATERAL MENISECTOMY Right 03/04/2020   Procedure: KNEE ARTHROSCOPY WITH PARTIAL LATERAL MENISECTOMY;  Surgeon: Hessie Knows, MD;  Location: [facility];  Service: Orthopedics;  Laterality: Right;   LEG SURGERY  1985   SHOULDER SURGERY     SUBACROMIAL DECOMPRESSION  2000   Right shoulder, [place]   TONSILLECTOMY  2001    OB History     Gravida  0   Para  0   Term  0   Preterm  0   AB  0   Living  0      SAB  0   IAB  0   Ectopic  0   Multiple  0   Live Births               Home Medications    Prior to Admission medications   Medication Sig Start Date End Date Taking? Authorizing Provider  Acetaminophen-Caffeine 500-65 MG TABS Take 1 tablet by mouth in the morning, at noon, and at bedtime.     [provider]  albuterol (VENTOLIN HFA) 108 (90 Base) MCG/ACT inhaler Inhale 2 puffs into the lungs every 6 (six)  hours as needed for wheezing or shortness of breath. 11/17/21   Rubie Maid, MD  amLODipine (NORVASC) 10 MG tablet TAKE 1 TABLET BY MOUTH EVERY DAY 08/02/22   Crecencio Mc, MD  Ascorbic Acid (VITAMIN C) 500 MG CAPS Take 1 tablet by mouth.    [provider]  Atogepant (QULIPTA) 60 MG TABS  01/10/21   [provider]  baclofen (LIORESAL) 10 MG tablet Take 10 mg by mouth 2 (two) times daily as needed. 06/03/21   [provider]  carbamazepine (CARBATROL) 300 MG 12 hr capsule Take 300 mg by mouth in the morning, at noon, and at bedtime.  04/04/17   [provider]  Cholecalciferol (VITAMIN D3) 25 MCG (1000 UT) CAPS Take 1 capsule by mouth.    [provider]  diphenhydrAMINE HCl, Sleep, (ZZZQUIL PO) Take by mouth at bedtime.    [provider]  escitalopram (LEXAPRO) 10 MG tablet TAKE 1 TABLET BY MOUTH EVERY DAY 08/02/22   Crecencio Mc, MD  fluticasone (FLONASE) 50 MCG/ACT nasal spray Place 2 sprays into both nostrils daily. 12/13/21   Flinchum, Kelby Aline, FNP  fluticasone furoate-vilanterol (BREO ELLIPTA) 100-25 MCG/INH AEPB Inhale into the lungs. 04/15/20   [provider]  MAGNESIUM PO Take 500 mg by mouth.    [provider]  medroxyPROGESTERone (DEPO-PROVERA) 150 MG/ML injection INJECT 1 ML (150 MG TOTAL) INTO THE MUSCLE EVERY 3 (THREE) MONTHS. 12/09/21   Rubie Maid, MD  metoprolol succinate (TOPROL-XL) 100 MG 24 hr tablet TAKE 1 TABLET BY MOUTH EVERY DAY WITH OR IMMEDIATELY FOLLOWING A MEAL 08/02/22   Crecencio Mc, MD  Multiple Vitamins-Minerals (CENTRUM WOMEN PO) Take by mouth daily in the afternoon.    [provider]  omeprazole (PRILOSEC) 20 MG capsule Take 1 capsule (20 mg total) by mouth 2 (two) times daily before a meal. 03/28/22   Derrel Nip, Aris Everts, MD  Spacer/Aero-Holding Chambers (AEROCHAMBER MV) inhaler Use as instructed 09/06/22   Melynda Ripple, MD    Family History Family History  Problem  Relation Age of Onset   Diabetes Mother    Coronary artery disease Mother    Hyperlipidemia Mother    Hypertension Mother    Parkinson's disease Mother    Heart disease Maternal Grandfather    Stroke Father     Social History Social History   Tobacco Use   Smoking status: Never   Smokeless tobacco: Never  Vaping Use   Vaping Use: Never used  Substance Use Topics   Alcohol use: No   Drug use: No     Allergies   Zonegran [zonisamide]   Review of Systems Review of Systems  Genitourinary:  Positive for flank pain.     Physical Exam Triage Vital Signs ED Triage Vitals  Enc Vitals Group     BP 10/12/22 1447 Marland Kitchen)  139/97     Pulse Rate 10/12/22 1447 86     Resp 10/12/22 1447 16     Temp 10/12/22 1447 97.7 F (36.5 C)     Temp src --      SpO2 10/12/22 1447 95 %     Weight --      Height --      Head Circumference --      Peak Flow --      Pain Score 10/12/22 1448 8     Pain Loc --      Pain Edu? --      Excl. in [place]? --    No data found.  Updated Vital Signs BP (!) 139/97   Pulse 86   Temp 97.7 F (36.5 C)   Resp 16   SpO2 95%   Visual Acuity Right Eye Distance:   Left Eye Distance:   Bilateral Distance:    Right Eye Near:   Left Eye Near:    Bilateral Near:     Physical Exam Vitals reviewed.  Constitutional:      Appearance: Normal appearance.  Abdominal:     Tenderness: There is abdominal tenderness in the right upper quadrant. There is right CVA tenderness. Positive signs include Murphy's sign.  Skin:    General: Skin is warm and dry.  Neurological:     General: No focal deficit present.     Mental Status: She is alert and oriented to person, place, and time.  Psychiatric:        Mood and Affect: Mood normal.        Behavior: Behavior normal.      UC Treatments / Results  Labs (all labs ordered are listed, but only abnormal results are displayed) Labs Reviewed - No data to display  EKG   Radiology No results  found.  Procedures Procedures (including critical care time)  Medications Ordered in UC Medications - No data to display  Initial Impression / Assessment and Plan / UC Course  I have reviewed the triage vital signs and the nursing notes.  Pertinent labs & imaging results that were available during my care of the patient were reviewed by me and considered in my medical decision making (see chart for details).   UA is negative for signs of infection.  Patient has history of nephrolithiasis.  Considered cholelithiasis however patient's symptoms are not worsened by.  Recommended patient be evaluated in the ED if symptoms do not resolve, or worsen.   Final Clinical Impressions(s) / UC Diagnoses   Final diagnoses:  None   Discharge Instructions   None    ED Prescriptions   None    PDMP not reviewed this encounter.   Rose Phi, Petros 10/12/22 1539

## 2022-10-12 NOTE — ED Triage Notes (Signed)
Pt. Presents to uc w/ c/o right flank pain that started 2 days ago. Pt. States the pain is aggravated with movement and breathing.

## 2022-10-17 ENCOUNTER — Encounter: Payer: Self-pay | Admitting: Family Medicine

## 2022-10-17 ENCOUNTER — Ambulatory Visit (INDEPENDENT_AMBULATORY_CARE_PROVIDER_SITE_OTHER)
Admission: RE | Admit: 2022-10-17 | Discharge: 2022-10-17 | Disposition: A | Discharge status: Home / Self Care | Payer: Medicare Other | Source: Ambulatory Visit | Attending: Family Medicine | Admitting: Family Medicine

## 2022-10-17 ENCOUNTER — Ambulatory Visit (INDEPENDENT_AMBULATORY_CARE_PROVIDER_SITE_OTHER): Payer: Medicare Other | Admitting: Family Medicine

## 2022-10-17 VITALS — BP 130/66 | HR 94 | Temp 97.9°F | Ht 63.0 in | Wt 199.1 lb

## 2022-10-17 DIAGNOSIS — R109 Unspecified abdominal pain: Secondary | ICD-10-CM | POA: Diagnosis not present

## 2022-10-17 DIAGNOSIS — R079 Chest pain, unspecified: Secondary | ICD-10-CM

## 2022-10-17 DIAGNOSIS — G8929 Other chronic pain: Secondary | ICD-10-CM

## 2022-10-17 DIAGNOSIS — R1011 Right upper quadrant pain: Secondary | ICD-10-CM | POA: Diagnosis not present

## 2022-10-17 LAB — HEPATIC FUNCTION PANEL
ALT: 25 U/L (ref 0–35)
AST: 20 U/L (ref 0–37)
Albumin: 4.5 g/dL (ref 3.5–5.2)
Alkaline Phosphatase: 94 U/L (ref 39–117)
Bilirubin, Direct: 0.1 mg/dL (ref 0.0–0.3)
Total Bilirubin: 0.3 mg/dL (ref 0.2–1.2)
Total Protein: 7.1 g/dL (ref 6.0–8.3)

## 2022-10-17 LAB — CBC WITH DIFFERENTIAL/PLATELET
Basophils Absolute: 0 10*3/uL (ref 0.0–0.1)
Basophils Relative: 0.5 % (ref 0.0–3.0)
Eosinophils Absolute: 0.1 10*3/uL (ref 0.0–0.7)
Eosinophils Relative: 1.9 % (ref 0.0–5.0)
HCT: 39 % (ref 36.0–46.0)
Hemoglobin: 13 g/dL (ref 12.0–15.0)
Lymphocytes Relative: 24.1 % (ref 12.0–46.0)
Lymphs Abs: 1.3 10*3/uL (ref 0.7–4.0)
MCHC: 33.2 g/dL (ref 30.0–36.0)
MCV: 92.4 fl (ref 78.0–100.0)
Monocytes Absolute: 0.4 10*3/uL (ref 0.1–1.0)
Monocytes Relative: 6.9 % (ref 3.0–12.0)
Neutro Abs: 3.7 10*3/uL (ref 1.4–7.7)
Neutrophils Relative %: 66.6 % (ref 43.0–77.0)
Platelets: 293 10*3/uL (ref 150.0–400.0)
RBC: 4.22 Mil/uL (ref 3.87–5.11)
RDW: 13.2 % (ref 11.5–15.5)
WBC: 5.6 10*3/uL (ref 4.0–10.5)

## 2022-10-17 LAB — BASIC METABOLIC PANEL
BUN: 14 mg/dL (ref 6–23)
CO2: 26 mEq/L (ref 19–32)
Calcium: 9.4 mg/dL (ref 8.4–10.5)
Chloride: 106 mEq/L (ref 96–112)
Creatinine, Ser: 0.62 mg/dL (ref 0.40–1.20)
GFR: 108.76 mL/min (ref >60.00)
Glucose, Bld: 100 mg/dL — ABNORMAL HIGH (ref 70–99)
Potassium: 3.9 mEq/L (ref 3.5–5.1)
Sodium: 139 mEq/L (ref 135–145)

## 2022-10-17 LAB — LIPASE: Lipase: 59 U/L (ref 11.0–59.0)

## 2022-10-17 NOTE — Assessment & Plan Note (Signed)
Per pt today's pain is higher   Neg CT in sept for renal stone Recent neg ua Reviewed hospital records, lab results and studies in detail

## 2022-10-17 NOTE — Patient Instructions (Signed)
Use your anti inflammatory for pain  Warm compress can help  Watch for rash  Alert [place] if any new symptoms   Chest xray now  Labs now May consider an ultrasound   I will let [name] know   If pain worsens go to the ER

## 2022-10-17 NOTE — Progress Notes (Signed)
Subjective:    Patient ID: Margaret Zuniga, female    DOB: 12-05-1981, 41 y.o.   MRN: [id number]  HPI 41 yo pt of [name] presents with pain under R breast   Wt Readings from Last 3 Encounters:  10/17/22 199 lb 2 oz (90.3 kg)  09/06/22 200 lb (90.7 kg)  08/09/22 200 lb (90.7 kg)   35.27 kg/m  Pt presents with pain under R breast for a week  Not doing anything when it started  No trauma or heavy lifting  Was seen in ER on 11/2 with R flank pain  Sharp and worsened with movement and breathing  Ua neg for infection  Noted past h/o nephrolithiasis  Did not eval for gallstones   This is higher than the usual area  About the same lateral place   No n/v Not worse with eating  Worse with movement   No cough  Did have covd in late sept  She did cough with it     Results for orders placed or performed during the hospital encounter of 10/12/22  POCT urinalysis dipstick  Result Value Ref Range   Color, UA yellow yellow   Clarity, UA clear clear   Glucose, UA negative negative mg/dL   Bilirubin, UA negative negative   Ketones, POC UA negative negative mg/dL   Spec Grav, UA 1.020 1.010 - 1.025   Blood, UA negative negative   pH, UA 5.5 5.0 - 8.0   Protein Ur, POC negative negative mg/dL   Urobilinogen, UA 0.2 0.2 or 1.0 E.U./dL   Nitrite, UA Negative Negative   Leukocytes, UA Negative Negative    Last CT renal stone study was done 08/2022 CT RENAL STONE STUDY (Accession [id number]) (Order [id number]) Imaging Date: 08/21/2022 Department: [facility] Released By: Melodie Bouillon Authorizing: Abbie Sons, MD   Exam Status  Status  Final [99]   PACS Intelerad Image Link   Show images for CT RENAL STONE STUDY Study Result  Narrative & Impression  CLINICAL DATA:  Left flank pain, microscopic hematuria. History of renal stones.   EXAM: CT ABDOMEN AND PELVIS WITHOUT CONTRAST   TECHNIQUE: Multidetector CT imaging of  the abdomen and pelvis was performed following the standard protocol without IV contrast.   RADIATION DOSE REDUCTION: This exam was performed according to the departmental dose-optimization program which includes automated exposure control, adjustment of the mA and/or kV according to patient size and/or use of iterative reconstruction technique.   COMPARISON:  CT January 14, 2020.   FINDINGS: Lower chest: Hypoventilatory change in the lung bases.   Hepatobiliary: Unremarkable noncontrast enhanced appearance of the liver and gallbladder. No biliary ductal dilation.   Pancreas: No pancreatic ductal dilation or evidence of acute inflammation.   Spleen: No splenomegaly.   Adrenals/Urinary Tract: Bilateral adrenal glands appear normal. No hydronephrosis. No renal, ureteral or bladder calculi identified. Urinary bladder is unremarkable for degree of distension.   Stomach/Bowel: No radiopaque enteric contrast material was administered. Stomach is unremarkable for degree of distension. No pathologic dilation of small or large bowel. The appendix and terminal ileum appear normal. No evidence of acute bowel inflammation.   Vascular/Lymphatic: Normal caliber abdominal aorta. No pathologically enlarged abdominal or pelvic lymph nodes.   Reproductive: Uterus and bilateral adnexa are unremarkable.   Other: No significant abdominopelvic free fluid.   Musculoskeletal: No acute osseous abnormality.   IMPRESSION: No acute abnormality in the abdomen or pelvis identified. Specifically no evidence  of urolithiasis or obstructive uropathy.     Electronically Signed   By: [name] M.D.   On: 08/21/2022 17:01   Last mammogram 12/2021 -negative   Nl [place] of abd in 2016   Nl hepatitis screen in 02/2022   Past h/o elevated liver enzymes Lab Results  Component Value Date   ALT 26 11/17/2021   AST 21 11/17/2021   ALKPHOS 93 11/17/2021   BILITOT <0.2 11/17/2021   EKG today  Low  voltage prec leads L axis ? Dev Poss voltage criteria for LVH T wave inv in V4,5 and flat in V6 when compared to past EKG    BP Readings from Last 3 Encounters:  10/17/22 130/66  10/12/22 (!) 139/97  09/06/22 128/82   Pulse Readings from Last 3 Encounters:  10/17/22 94  10/12/22 86  09/06/22 (!) 116    Patient Active Problem List   Diagnosis Date Noted   Right upper quadrant abdominal pain 10/17/2022   Antibiotic-induced yeast infection 12/13/2021   Sore throat 12/13/2021   Gastroesophageal reflux disease without esophagitis 12/13/2021   Sub-Achilles bursitis, left 09/07/2021   Chronic right flank pain 06/24/2021   Acute pain of left shoulder 10/19/2020   Abnormal gait 10/19/2020   Recurrent falls 10/19/2020   Chronic intractable headache 10/19/2020   Ganglion cyst 10/19/2020   Baker's cyst of knee, right 10/19/2020   Depo-Provera contraceptive status 02/11/2020   Hydronephrosis concurrent with and due to calculi of kidney and ureter 01/16/2020   Paralysis ([place]) 12/18/2019   Bilateral leg weakness 10/21/2019   Depression 06/19/2019   Acute right ankle pain 06/03/2019   Educated about COVID-19 virus infection 06/03/2019   Nephrolithiasis 03/24/2018   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   Obesity, Class II, BMI 35-39.9 04/11/2017   Elevated liver enzymes 04/11/2017   Extrinsic asthma 08/15/2016   Solitary pulmonary nodule 07/03/2016   Essential hypertension, benign 04/04/2016   H/O varicella 08/19/2015   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   Deformity, finger, 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   Bilateral thoracic back pain 04/21/2014   Encounter for preventive health examination 04/21/2014   Major depressive disorder, recurrent episode, moderate ([place]) 05/21/2013   Posterior right knee pain 03/26/2013   Personal history of traumatic brain injury    Eustachian tube dysfunction, bilateral 05/05/2012   Intractable chronic post-traumatic headache 02/14/2012   Past Medical History:   Diagnosis Date   Bilateral nephrolithiasis 03/24/2018   Essential hypertension, benign 04/04/2016   Extrinsic asthma 08/15/2016   Headache due to trauma    chronic, takes, NSAIDs , imipramine, muscle relaxers (failed [facility])   History of kidney stones    Hypertension    Major depressive disorder, recurrent episode, moderate ([place]) 05/21/2013   Obesity (BMI 30.0-34.9) 04/11/2017   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 Marry Guan   Past Surgical History:  Procedure Laterality Date   ELBOW SURGERY Right [place] LITHOTRIPSY Left 01/22/2020   Procedure: EXTRACORPOREAL SHOCK WAVE LITHOTRIPSY (ESWL);  Surgeon: Abbie Sons, MD;  Location: [facility] ORS;  Service: Urology;  Laterality: Left;   EYE SURGERY  1995   KNEE ARTHROSCOPY WITH LATERAL MENISECTOMY Right 03/04/2020   Procedure: KNEE ARTHROSCOPY WITH PARTIAL LATERAL MENISECTOMY;  Surgeon: Hessie Knows, MD;  Location: [facility] OR[facility];  Service: Orthopedics;  Laterality: Right;   LEG  SURGERY  1985   SHOULDER SURGERY     SUBACROMIAL DECOMPRESSION  2000   Right shoulder, [place]   TONSILLECTOMY  2001   Social History   Tobacco Use   Smoking status: Never   Smokeless tobacco: Never  Vaping Use   Vaping Use: Never used  Substance Use Topics   Alcohol use: No   Drug use: No   Family History  Problem Relation Age of Onset   Diabetes Mother    Coronary artery disease Mother    Hyperlipidemia Mother    Hypertension Mother    Parkinson's disease Mother    Heart disease Maternal Grandfather    Stroke Father    Allergies  Allergen Reactions   Zonegran [Zonisamide] Rash   Current Outpatient Medications on File Prior to Visit  Medication Sig Dispense Refill   Acetaminophen-Caffeine 500-65 MG TABS Take 1 tablet by mouth in the morning, at noon, and at bedtime.       albuterol (VENTOLIN HFA) 108 (90 Base) MCG/ACT inhaler Inhale 2 puffs into the lungs every 6 (six) hours as needed for wheezing or shortness of breath.     amLODipine (NORVASC) 10 MG tablet TAKE 1 TABLET BY MOUTH EVERY DAY 90 tablet 1   Ascorbic Acid (VITAMIN C) 500 MG CAPS Take 1 tablet by mouth.     Atogepant (QULIPTA) 60 MG TABS      baclofen (LIORESAL) 10 MG tablet Take 10 mg by mouth 2 (two) times daily as needed.     carbamazepine (CARBATROL) 300 MG 12 hr capsule Take 300 mg by mouth in the morning, at noon, and at bedtime.   1   Cholecalciferol (VITAMIN D3) 25 MCG (1000 UT) CAPS Take 1 capsule by mouth.     diphenhydrAMINE HCl, Sleep, (ZZZQUIL PO) Take by mouth at bedtime.     escitalopram (LEXAPRO) 10 MG tablet TAKE 1 TABLET BY MOUTH EVERY DAY 90 tablet 0   fluticasone (FLONASE) 50 MCG/ACT nasal spray Place 2 sprays into both nostrils daily. 16 g 6   fluticasone furoate-vilanterol (BREO ELLIPTA) 100-25 MCG/INH AEPB Inhale into the lungs.     MAGNESIUM PO Take 500 mg by mouth.     medroxyPROGESTERone (DEPO-PROVERA) 150 MG/ML injection INJECT 1 ML (150 MG TOTAL) INTO THE MUSCLE EVERY 3 (THREE) MONTHS. 1 mL 3   metoprolol succinate (TOPROL-XL) 100 MG 24 hr tablet TAKE 1 TABLET BY MOUTH EVERY DAY WITH OR IMMEDIATELY FOLLOWING A MEAL 90 tablet 0   Multiple Vitamins-Minerals (CENTRUM WOMEN PO) Take by mouth daily in the afternoon.     omeprazole (PRILOSEC) 20 MG capsule Take 1 capsule (20 mg total) by mouth 2 (two) times daily before a meal. 180 capsule 3   Spacer/Aero-Holding Chambers (AEROCHAMBER MV) inhaler Use as instructed 1 each 1   No current facility-administered medications on file prior to visit.     Review of Systems  Constitutional:  Positive for fatigue. Negative for activity change, appetite change, fever and unexpected weight change.  HENT:  Negative for congestion, ear pain, rhinorrhea, sinus pressure and sore throat.   Eyes:  Negative for pain, redness and visual  disturbance.  Respiratory:  Negative for cough, shortness of breath and wheezing.   Cardiovascular:  Negative for chest pain, palpitations and leg swelling.  Gastrointestinal:  Positive for abdominal pain. Negative for abdominal distention, anal bleeding, blood in stool, constipation, diarrhea, nausea, rectal pain and vomiting.  Endocrine: Negative for polydipsia and polyuria.  Genitourinary:  Positive for flank pain. Negative  for dysuria, frequency and urgency.  Musculoskeletal:  Negative for arthralgias, back pain and myalgias.  Skin:  Negative for pallor and rash.  Allergic/Immunologic: Negative for environmental allergies.  Neurological:  Positive for headaches. Negative for dizziness and syncope.  Hematological:  Negative for adenopathy. Does not bruise/bleed easily.  Psychiatric/Behavioral:  Negative for decreased concentration and dysphoric mood. The patient is not nervous/anxious.        Objective:   Physical Exam Constitutional:      General: She is not in acute distress.    Appearance: Normal appearance. She is well-developed. She is obese. She is not ill-appearing or diaphoretic.  HENT:     Head: Normocephalic and atraumatic.  Eyes:     Conjunctiva/sclera: Conjunctivae normal.     Pupils: Pupils are equal, round, and reactive to light.  Neck:     Thyroid: No thyromegaly.     Vascular: No carotid bruit or JVD.  Cardiovascular:     Rate and Rhythm: Normal rate and regular rhythm.     Heart sounds: Normal heart sounds.     No gallop.  Pulmonary:     Effort: Pulmonary effort is normal. No respiratory distress.     Breath sounds: Normal breath sounds. No wheezing or rales.     Comments: Mild r lower ant rib tenderness w/o crepitus of step off  Chest:     Chest wall: Tenderness present.  Abdominal:     General: Abdomen is protuberant. Bowel sounds are normal. There is no distension or abdominal bruit.     Palpations: Abdomen is soft. There is no fluid wave, hepatomegaly,  splenomegaly, mass or pulsatile mass.     Tenderness: There is abdominal tenderness in the right upper quadrant. There is no right CVA tenderness, left CVA tenderness, guarding or rebound. Positive signs include Murphy's sign. Negative signs include McBurney's sign.     Hernia: No hernia is present.  Musculoskeletal:     Cervical back: Normal range of motion and neck supple.     Right lower leg: No edema.     Left lower leg: No edema.  Lymphadenopathy:     Cervical: No cervical adenopathy.  Skin:    General: Skin is warm and dry.     Coloration: Skin is not pale.     Findings: No rash.     Comments: No rash  Neurological:     Mental Status: She is alert.     Comments: Baseline hemiparesis   Psychiatric:        Mood and Affect: Mood normal.           Assessment & Plan:   Problem List Items Addressed This Visit       Other   Chronic right flank pain    Per pt today's pain is higher   Neg CT in sept for renal stone Recent neg ua Reviewed hospital records, lab results and studies in detail        Right upper quadrant abdominal pain - Primary    This appears acute on chronic but this episode is higher up and with some pleuritic component Had covid in sept but not coughing now  Tender in abd and ribs on exam with pos murphy sign Past h/o elevated lft and neg hepatitis tests Rev past med hx today and recent ER records Reviewed hospital records, lab results and studies in detail   Diff incl gallstones, costochondritis, pna , early zoster w/o rash   Lab today  Cxr today  Consider [place] of abd if above are nl  (she pref [place])  ER precautions disc in detail  Disc sympt care  Update if not starting to improve in a week or if worsening         Relevant Orders   CBC with Differential/Platelet   Basic metabolic panel   Hepatic function panel   Lipase   Other Visit Diagnoses     Right-sided chest pain       Relevant Orders   EKG 12-Lead (Completed)   DG Chest  2 View (Completed)

## 2022-10-17 NOTE — Assessment & Plan Note (Addendum)
This appears acute on chronic but this episode is higher up and with some pleuritic component Had covid in sept but not coughing now  Tender in abd and ribs on exam with pos murphy sign Past h/o elevated lft and neg hepatitis tests Rev past med hx today and recent ER records Reviewed hospital records, lab results and studies in detail   Diff incl gallstones, costochondritis, pna , early zoster w/o rash  EKG reassuring and ? Of voltage fitting criteria of LVH  Lab today  Cxr today  Consider [place] of abd if above are nl  (she pref [place])  ER precautions disc in detail  Disc sympt care  Update if not starting to improve in a week or if worsening

## 2022-10-17 NOTE — Addendum Note (Signed)
Addended by: Loura Pardon A on: 10/17/2022 07:03 PM   Modules accepted: Orders

## 2022-10-23 ENCOUNTER — Ambulatory Visit
Admission: RE | Admit: 2022-10-23 | Discharge: 2022-10-23 | Disposition: A | Discharge status: Home / Self Care | Payer: Medicare Other | Source: Ambulatory Visit | Attending: Family Medicine | Admitting: Family Medicine

## 2022-10-23 DIAGNOSIS — R1011 Right upper quadrant pain: Secondary | ICD-10-CM

## 2022-10-24 ENCOUNTER — Telehealth: Payer: Self-pay

## 2022-10-24 NOTE — Telephone Encounter (Signed)
Left a detailed msg about pts [place] and if she had any questions or concerns to CB.  Pt did see what Dr. Glori Bickers said on Mychart already was just checking to see if she had any concerns.   Tower, Wynelle Fanny, MD  P Tower Pool Cc: Crecencio Mc, MD Your ultrasound looks normal which is very reassuring (no gallstones or other findings)  If symptoms (pain) continue I recommend follow up with your pcp

## 2022-10-25 DIAGNOSIS — H5213 Myopia, bilateral: Secondary | ICD-10-CM | POA: Diagnosis not present

## 2022-10-25 DIAGNOSIS — H5010 Unspecified exotropia: Secondary | ICD-10-CM | POA: Diagnosis not present

## 2022-10-27 ENCOUNTER — Ambulatory Visit (INDEPENDENT_AMBULATORY_CARE_PROVIDER_SITE_OTHER): Payer: Medicare Other

## 2022-10-27 ENCOUNTER — Ambulatory Visit
Admission: RE | Admit: 2022-10-27 | Discharge: 2022-10-27 | Disposition: A | Discharge status: Home / Self Care | Payer: Medicare Other | Source: Ambulatory Visit | Attending: Family Medicine | Admitting: Family Medicine

## 2022-10-27 VITALS — BP 128/86 | HR 86 | Temp 98.1°F | Resp 16 | Ht 62.0 in | Wt 200.0 lb

## 2022-10-27 DIAGNOSIS — R109 Unspecified abdominal pain: Secondary | ICD-10-CM

## 2022-10-27 LAB — URINALYSIS, ROUTINE W REFLEX MICROSCOPIC
Bilirubin Urine: NEGATIVE
Glucose, UA: NEGATIVE mg/dL
Hgb urine dipstick: NEGATIVE
Ketones, ur: NEGATIVE mg/dL
Leukocytes,Ua: NEGATIVE
Nitrite: NEGATIVE
Protein, ur: NEGATIVE mg/dL
Specific Gravity, Urine: 1.02 (ref 1.005–1.030)
pH: 5.5 (ref 5.0–8.0)

## 2022-10-27 MED ORDER — METHOCARBAMOL 500 MG PO TABS: 500.0000 mg | ORAL_TABLET | Freq: Two times a day (BID) | ORAL | 0 refills | Status: DC

## 2022-10-27 MED ORDER — TRAMADOL HCL 50 MG PO TABS: 50.0000 mg | ORAL_TABLET | Freq: Four times a day (QID) | ORAL | 0 refills | Status: DC | PRN

## 2022-10-27 NOTE — Discharge Instructions (Addendum)
Your chest and rib x-rays were normal.  There were no fractures seen.  No cause of your right flank pain.  Your urine sample did not have any blood that was concerning for kidney stone.  Additionally, your previous kidney ultrasounds did not show a stone.  The cause of your flank pain is unknown at this time.  We will treat you for a pulled muscle to see if that helps.  Hold your home baclofen while taking this medication (Robaxin/methocarbamol).  Follow-up with your primary care provider next week, as scheduled.

## 2022-10-27 NOTE — ED Provider Notes (Signed)
[facility]    CSN: [id number] Arrival date & time: 10/27/22  1157      History   Chief Complaint Chief Complaint  Patient presents with   Flank Pain    Right side flank pain. X2 weeks     HPI HPI Margaret Zuniga is a 41 y.o. female.    Margaret Zuniga presents for "horrible" pain in her right side under her breasts for the past 2 weeks. It hurts to breath and bending to put her shoes on. She has had kidney stones in the past.  In the past couple weeks she is seen by a couple providers for a work-up for her right flank pain.  No one has been able to pinpoint a cause.  Her mom states that she is not acting like herself because the pain.  Denies any hematuria,  shortness of breath, recent trauma or falls.  Had a slight cough.  States she has not been lifting anything heavy recently.  There has been no fever, vomiting or nausea.        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 Marry Guan    Patient Active Problem List   Diagnosis Date Noted   Right upper quadrant abdominal pain 10/17/2022   Antibiotic-induced yeast infection 12/13/2021   Sore throat 12/13/2021   Gastroesophageal reflux disease without esophagitis 12/13/2021   Sub-Achilles bursitis, left 09/07/2021   Chronic right flank pain 06/24/2021   Acute pain of left shoulder 10/19/2020   Abnormal gait 10/19/2020   Recurrent falls 10/19/2020   Chronic intractable  headache 10/19/2020   Ganglion cyst 10/19/2020   Baker's cyst of knee, right 10/19/2020   Depo-Provera contraceptive status 02/11/2020   Hydronephrosis concurrent with and due to calculi of kidney and ureter 01/16/2020   Paralysis ([place]) 12/18/2019   Bilateral leg weakness 10/21/2019   Depression 06/19/2019   Acute right ankle pain 06/03/2019   Educated about COVID-19 virus infection 06/03/2019   Nephrolithiasis 03/24/2018   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   Obesity, Class II, BMI 35-39.9 04/11/2017   Elevated liver enzymes 04/11/2017   Extrinsic asthma 08/15/2016   Solitary pulmonary nodule 07/03/2016   Essential hypertension, benign 04/04/2016   H/O varicella 08/19/2015   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   Deformity, finger, 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   Bilateral thoracic back pain 04/21/2014   Encounter for preventive health examination 04/21/2014   Major depressive disorder, recurrent episode, moderate ([place]) 05/21/2013   Posterior right knee pain 03/26/2013   Personal history of traumatic brain injury    Eustachian tube dysfunction, bilateral 05/05/2012   Intractable chronic post-traumatic headache 02/14/2012    Past Surgical History:  Procedure Laterality Date   ELBOW SURGERY Right [place] LITHOTRIPSY Left 01/22/2020   Procedure: EXTRACORPOREAL SHOCK WAVE LITHOTRIPSY (ESWL);  Surgeon: Abbie Sons, MD;  Location: [facility];  Service: Urology;  Laterality: Left;   EYE SURGERY  1995   KNEE ARTHROSCOPY WITH LATERAL MENISECTOMY Right 03/04/2020   Procedure: KNEE ARTHROSCOPY WITH PARTIAL LATERAL MENISECTOMY;  Surgeon: Hessie Knows, MD;  Location: [facility];  Service: Orthopedics;  Laterality: Right;   LEG SURGERY  1985   SHOULDER SURGERY     SUBACROMIAL DECOMPRESSION  2000   Right shoulder, [place]   TONSILLECTOMY  2001    OB History     Gravida  0   Para  0   Term  0   Preterm  0   AB  0   Living  0      SAB  0   IAB  0   Ectopic  0    Multiple  0   Live Births               Home Medications    Prior to Admission medications   Medication Sig Start Date End Date Taking? Authorizing Provider  Acetaminophen-Caffeine 500-65 MG TABS Take 1 tablet by mouth in the morning, at noon, and at bedtime.    Yes [provider]  albuterol (VENTOLIN HFA) 108 (90 Base) MCG/ACT inhaler Inhale 2 puffs into the lungs every 6 (six) hours as needed for wheezing or shortness of breath. 11/17/21  Yes Rubie Maid, MD  amLODipine (NORVASC) 10 MG tablet TAKE 1 TABLET BY MOUTH EVERY DAY 08/02/22  Yes Crecencio Mc, MD  Ascorbic Acid (VITAMIN C) 500 MG CAPS Take 1 tablet by mouth.   Yes [provider]  Atogepant (QULIPTA) 60 MG TABS  01/10/21  Yes [provider]  baclofen (LIORESAL) 10 MG tablet Take 10 mg by mouth 2 (two) times daily as needed. 06/03/21  Yes [provider]  carbamazepine (CARBATROL) 300 MG 12 hr capsule Take 300 mg by mouth in the morning, at noon, and at bedtime.  04/04/17  Yes [provider]  Cholecalciferol (VITAMIN D3) 25 MCG (1000 UT) CAPS Take 1 capsule by mouth.   Yes [provider]  diphenhydrAMINE HCl, Sleep, (ZZZQUIL PO) Take by mouth at bedtime.   Yes [provider]  escitalopram (LEXAPRO) 10 MG tablet TAKE 1 TABLET BY MOUTH EVERY DAY 08/02/22  Yes Crecencio Mc, MD  fluticasone (FLONASE) 50 MCG/ACT nasal spray Place 2 sprays into both nostrils daily. 12/13/21  Yes Flinchum, Kelby Aline, FNP  fluticasone furoate-vilanterol (BREO ELLIPTA) 100-25 MCG/INH AEPB Inhale into the lungs. 04/15/20  Yes [provider]  MAGNESIUM PO Take 500 mg by mouth.   Yes [provider]  medroxyPROGESTERone (DEPO-PROVERA) 150 MG/ML injection INJECT 1 ML (150 MG TOTAL) INTO THE MUSCLE EVERY 3 (THREE) MONTHS. 12/09/21  Yes Rubie Maid, MD  methocarbamol (ROBAXIN) 500 MG tablet Take 1 tablet (500 mg total) by mouth 2 (two) times daily. 10/27/22  Yes Hailie Searight,  Cherylin Waguespack, DO  metoprolol succinate (TOPROL-XL) 100 MG 24 hr tablet TAKE 1 TABLET BY MOUTH EVERY DAY WITH OR IMMEDIATELY FOLLOWING A MEAL 08/02/22  Yes Crecencio Mc, MD  Multiple Vitamins-Minerals (CENTRUM WOMEN PO) Take by mouth daily in the afternoon.   Yes [provider]  omeprazole (PRILOSEC) 20 MG capsule Take 1 capsule (20 mg total) by mouth 2 (two) times daily before a meal. 03/28/22  Yes Crecencio Mc, MD  Spacer/Aero-Holding Chambers (AEROCHAMBER MV) inhaler Use as instructed 09/06/22  Yes Melynda Ripple, MD  traMADol (ULTRAM) 50 MG tablet Take 1 tablet (50 mg total) by mouth every 6 (six) hours as needed. 10/27/22  Yes Lyndee Hensen, DO  Family History Family History  Problem Relation Age of Onset   Diabetes Mother    Coronary artery disease Mother    Hyperlipidemia Mother    Hypertension Mother    Parkinson's disease Mother    Heart disease Maternal Grandfather    Stroke Father     Social History Social History   Tobacco Use   Smoking status: Never   Smokeless tobacco: Never  Vaping Use   Vaping Use: Never used  Substance Use Topics   Alcohol use: No   Drug use: No     Allergies   Zonegran [zonisamide]   Review of Systems Review of Systems: :negative unless otherwise stated in HPI.      Physical Exam Triage Vital Signs ED Triage Vitals  Enc Vitals Group     BP 10/27/22 1210 128/86     Pulse Rate 10/27/22 1210 86     Resp 10/27/22 1210 16     Temp 10/27/22 1210 98.1 F (36.7 C)     Temp Source 10/27/22 1210 Oral     SpO2 10/27/22 1210 95 %     Weight 10/27/22 1209 200 lb (90.7 kg)     Height 10/27/22 1209 '5\' 2"'$  (1.575 m)     Head Circumference --      Peak Flow --      Pain Score 10/27/22 1209 10     Pain Loc --      Pain Edu? --      Excl. in [place]? --    No data found.  Updated Vital Signs BP 128/86 (BP Location: Left Arm)   Pulse 86   Temp 98.1 F (36.7 C) (Oral)   Resp 16   Ht '5\' 2"'$  (1.575 m)   Wt 90.7 kg   SpO2 95%    BMI 36.58 kg/m   Visual Acuity Right Eye Distance:   Left Eye Distance:   Bilateral Distance:    Right Eye Near:   Left Eye Near:    Bilateral Near:     Physical Exam GEN: Pleasant, nontoxic-appearing female in no acute distress  CVS: well perfused, regular rate and rhythm RESP: speaking in full sentences without pause, clear to auscultation bilaterally ABD: soft, non-tender, non-distended, no palpable masses, right CVA tenderness MSK: Right flank tender to palpation SKIN: No overlying skin changes on the right flank   UC Treatments / Results  Labs (all labs ordered are listed, but only abnormal results are displayed) Labs Reviewed  URINALYSIS, ROUTINE W REFLEX MICROSCOPIC    EKG   Radiology DG Ribs Unilateral W/Chest Right  Result Date: 10/27/2022 CLINICAL DATA:  Right flank pain EXAM: RIGHT RIBS AND CHEST - 3+ VIEW COMPARISON:  10/17/2022 FINDINGS: Cardiac size is within normal limits. There are no signs of pulmonary edema or focal pulmonary consolidation. Left hemidiaphragm is elevated. No displaced fractures are seen in right ribs. There are no focal lytic or sclerotic lesions in the bony structures. IMPRESSION: No radiographic abnormalities are seen in right ribs. No active cardiopulmonary disease is seen. Electronically Signed   By: Elmer Picker M.D.   On: 10/27/2022 12:54    Procedures Procedures (including critical care time)  Medications Ordered in UC Medications - No data to display  Initial Impression / Assessment and Plan / UC Course  I have reviewed the triage vital signs and the nursing notes.  Pertinent labs & imaging results that were available during my care of the patient were reviewed by me and considered in my medical  decision making (see chart for details).     Patient is a 41 year old female with history of recurrent right flank pain and kidney stones who presents for right flank pain.  She was seen in the urgent care in [place] on  10/12/2022 and also seen by her PCP on 10/17/2022.  Notes and labs from these visits are reviewed.  Abdominal ultrasound on 10/23/2022 did not show a kidney stone or hydronephrosis.  Additionally, liver and gallbladder were also normal.  She had a CT scan on September 2023 for left flank pain that did not show any kidney stones.  Urinalysis was repeated today and did not show hematuria or concerning signs for acute cystitis.  As she is having right rib/flank pain we got a chest x-ray with right rib series that did not show a pneumonia or rib fracture.  There is no pneumothorax.  Unfortunately, the cause of patient's flank pain has not been identified.  It appears on chart review that she has history of chronic flank pain.  She has an upcoming appointment with her primary care provider to keep this appointment.  She may need advanced imaging which is not available here at the urgent care today.  Patient updated with urinalysis and x-ray results.  All questions asked were answered.  We will treat her for a possible muscular strain with Robaxin and advised to take NSAIDs.  We also gave her some tramadol for severe pain.  Discussed MDM, treatment plan and plan for follow-up with patient/parent who agree with plan.        Final Clinical Impressions(s) / UC Diagnoses   Final diagnoses:  Right flank pain     Discharge Instructions      Your chest and rib x-rays were normal.  There were no fractures seen.  No cause of your right flank pain.  Your urine sample did not have any blood that was concerning for kidney stone.  Additionally, your previous kidney ultrasounds did not show a stone.  The cause of your flank pain is unknown at this time.  We will treat you for a pulled muscle to see if that helps.  Hold your home baclofen while taking this medication (Robaxin/methocarbamol).  Follow-up with your primary care provider next week, as scheduled.       ED Prescriptions     Medication Sig  Dispense Auth. Provider   traMADol (ULTRAM) 50 MG tablet Take 1 tablet (50 mg total) by mouth every 6 (six) hours as needed. 15 tablet Marquell Saenz, DO   methocarbamol (ROBAXIN) 500 MG tablet Take 1 tablet (500 mg total) by mouth 2 (two) times daily. 20 tablet Vienne Corcoran, Ronnette Juniper, DO      I have reviewed the PDMP during this encounter.   Lyndee Hensen, DO 10/27/22 2344

## 2022-10-27 NOTE — ED Triage Notes (Signed)
Pt states that she has some right sided flank pain. X2 weeks  Pt states that it hurts when she lies down and breathes.

## 2022-10-30 ENCOUNTER — Other Ambulatory Visit: Payer: Self-pay | Admitting: Internal Medicine

## 2022-11-01 ENCOUNTER — Ambulatory Visit (INDEPENDENT_AMBULATORY_CARE_PROVIDER_SITE_OTHER): Payer: Medicare Other | Admitting: Family Medicine

## 2022-11-01 ENCOUNTER — Encounter: Payer: Self-pay | Admitting: Family Medicine

## 2022-11-01 VITALS — BP 130/70 | HR 86 | Temp 98.2°F | Ht 63.0 in | Wt 198.2 lb

## 2022-11-01 DIAGNOSIS — F331 Major depressive disorder, recurrent, moderate: Secondary | ICD-10-CM | POA: Diagnosis not present

## 2022-11-01 DIAGNOSIS — R1011 Right upper quadrant pain: Secondary | ICD-10-CM | POA: Diagnosis not present

## 2022-11-01 DIAGNOSIS — G839 Paralytic syndrome, unspecified: Secondary | ICD-10-CM | POA: Diagnosis not present

## 2022-11-01 MED ORDER — POLYETHYLENE GLYCOL 3350 17 GM/SCOOP PO POWD: 17.0000 g | Freq: Two times a day (BID) | ORAL | 1 refills | Status: DC | PRN

## 2022-11-01 MED ORDER — SENNOSIDES 8.6 MG PO TABS: 1.0000 | ORAL_TABLET | Freq: Every day | ORAL | 0 refills | Status: AC

## 2022-11-01 NOTE — Progress Notes (Signed)
SUBJECTIVE:   Chief Complaint  Patient presents with   Acute Visit    Severe flank pain X 2 weeks   HPI Patient presents to clinic with pain under right breast area.  She reports she has been seen at 2 urgent care facilities a [facility] for similar symptoms.  Patient reports continues to have severe pain has been ongoing for about 2 weeks especially under the right breast with deep breathing.  She was last seen at urgent care in [facility] on 11/17 where workup was reassuring.  She was treated with methocarbamol and tramadol for ongoing pain.  Denies any fevers, headaches, cough, shortness of breath, nausea, vomiting, abdominal pain, bloody stool, diarrhea, urinary symptoms, weakness, numbness or tingling.    PERTINENT PMH / PSH: Hypertension Asthma GERD Thoracic back pain   OBJECTIVE:  BP 130/70 (BP Location: Left Arm, Patient Position: Sitting, Cuff Size: Normal)   Pulse 86   Temp 98.2 F (36.8 C) (Oral)   Ht '5\' 3"'$  (1.6 m)   Wt 198 lb 3.2 oz (89.9 kg)   SpO2 97%   BMI 35.11 kg/m    Physical Exam Constitutional:      General: She is not in acute distress.    Appearance: She is obese. She is not ill-appearing.  HENT:     Mouth/Throat:     Mouth: Mucous membranes are moist.  Eyes:     Conjunctiva/sclera: Conjunctivae normal.  Cardiovascular:     Rate and Rhythm: Normal rate and regular rhythm.  Pulmonary:     Effort: Pulmonary effort is normal.  Abdominal:     General: Bowel sounds are normal. There is distension.     Palpations: There is no mass.     Tenderness: There is abdominal tenderness. There is no right CVA tenderness, left CVA tenderness, guarding or rebound.  Skin:    General: Skin is warm.  Neurological:     Mental Status: She is alert.  Psychiatric:        Mood and Affect: Mood normal.        Thought Content: Thought content normal.        Judgment: Judgment normal.     ASSESSMENT/PLAN:  Right upper quadrant abdominal pain Assessment &  Plan: Continues to have right-sided pain under breast area.  Intermittent sharp pain with deep inspiration.  Most recent workup in urgent care included chest x-ray which was reassuring.  Prior to this was had right upper quadrant ultrasound which was negative for gallstones and chest x-ray which also was negative for any acute cardiopulmonary process. Reviewed CT renal study from 08/2022, shows stool in right ascending colon without distention.  Suspect constipation as possible etiology of continued pain. Will start bowel regime Increase water intake Increase fruits and vegetables If worsening symptoms follow-up with PCP or return to urgent care.  Orders: -     Polyethylene Glycol 3350; Take 17 g by mouth 2 (two) times daily as needed.  Dispense: 3350 g; Refill: 1 -     Sennosides; Take 1 tablet (8.6 mg total) by mouth daily.  Dispense: 30 tablet; Refill: 0  Major depressive disorder, recurrent episode, moderate (HCC) Assessment & Plan: Doing well on Lexapro 10 mg daily.  Denies any SI/HI Continue to follow-up PCP for further management.   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  Doing well Follow up with PCP     PDMP reviewed  Return in about 1 week (around 11/08/2022), or if symptoms worsen or fail to improve,  for PCP.  Carollee Leitz, MD

## 2022-11-01 NOTE — Patient Instructions (Signed)
It was a pleasure meeting you today. Thank you for allowing me to take part in your health care.  Our goals for today as we discussed include:  Start MiraLAX 2 scoops daily until normal bowels Once regulated can decrease to 1 scoop daily Start senna 1 tablet daily. If you decide to use your Dulcolax and do not use the senna.  If you have worsening symptoms, fever, nausea, vomiting, bloody stool, diarrhea please follow-up at the emergency department  Continue tramadol for pain as previously prescribed   If you have any questions or concerns, please do not hesitate to call the office at [phone].  I look forward to our next visit and until then take care and stay safe.  Regards,   Carollee Leitz, MD   [facility]

## 2022-11-12 ENCOUNTER — Other Ambulatory Visit: Payer: Self-pay | Admitting: Obstetrics and Gynecology

## 2022-11-14 ENCOUNTER — Ambulatory Visit (INDEPENDENT_AMBULATORY_CARE_PROVIDER_SITE_OTHER): Payer: Medicare Other

## 2022-11-14 VITALS — Ht 63.0 in | Wt 198.0 lb

## 2022-11-14 DIAGNOSIS — Z0000 Encounter for general adult medical examination without abnormal findings: Secondary | ICD-10-CM | POA: Diagnosis not present

## 2022-11-14 NOTE — Progress Notes (Addendum)
Subjective:   Margaret Zuniga is a 41 y.o. female who presents for Medicare Annual (Subsequent) preventive examination.  Review of Systems    No ROS.  Medicare Wellness Virtual Visit.  Visual/audio telehealth visit, UTA vital signs.   See social history for additional risk factors.   Cardiac Risk Factors include: advanced age (>87mn, >>40women)     Objective:    Today's Vitals   11/14/22 1410  Weight: 198 lb (89.8 kg)  Height: '5\' 3"'$  (1.6 m)   Body mass index is 35.07 kg/m.     11/14/2022    2:15 PM 09/06/2022   12:36 PM 11/09/2021    1:27 PM 10/05/2021    3:11 PM 09/27/2020   10:58 AM 03/04/2020    6:27 AM 02/26/2020   12:42 PM  Advanced Directives  Does Patient Have a Medical Advance Directive? Yes No Yes Yes No No Yes  Type of AParamedicof ASheridanLiving will  HRomeLiving will Living will   [place] Does patient want to make changes to medical advance directive? No - Patient declined  No - Patient declined No - Patient declined     Copy of HSandpointin Chart? No - copy requested  No - copy requested    No - copy requested  Would patient like information on creating a medical advance directive?    No - Patient declined No - Patient declined No - Patient declined     Current Medications (verified) Outpatient Encounter Medications as of 11/14/2022  Medication Sig   Acetaminophen-Caffeine 500-65 MG TABS Take 1 tablet by mouth in the morning, at noon, and at bedtime.    albuterol (VENTOLIN HFA) 108 (90 Base) MCG/ACT inhaler Inhale 2 puffs into the lungs every 6 (six) hours as needed for wheezing or shortness of breath.   amLODipine (NORVASC) 10 MG tablet TAKE 1 TABLET BY MOUTH EVERY DAY   Ascorbic Acid (VITAMIN C) 500 MG CAPS Take 1 tablet by mouth.   Atogepant (QULIPTA) 60 MG TABS    baclofen (LIORESAL) 10 MG tablet Take 10 mg by mouth 2 (two) times daily as needed.   carbamazepine  (CARBATROL) 300 MG 12 hr capsule Take 300 mg by mouth in the morning, at noon, and at bedtime.    Cholecalciferol (VITAMIN D3) 25 MCG (1000 UT) CAPS Take 1 capsule by mouth.   diphenhydrAMINE HCl, Sleep, (ZZZQUIL PO) Take by mouth at bedtime.   escitalopram (LEXAPRO) 10 MG tablet TAKE 1 TABLET BY MOUTH EVERY DAY   fluticasone (FLONASE) 50 MCG/ACT nasal spray Place 2 sprays into both nostrils daily.   fluticasone furoate-vilanterol (BREO ELLIPTA) 100-25 MCG/INH AEPB Inhale into the lungs.   MAGNESIUM PO Take 500 mg by mouth.   medroxyPROGESTERone (DEPO-PROVERA) 150 MG/ML injection INJECT 1 ML (150 MG TOTAL) INTO THE MUSCLE EVERY 3 (THREE) MONTHS.   methocarbamol (ROBAXIN) 500 MG tablet Take 1 tablet (500 mg total) by mouth 2 (two) times daily.   metoprolol succinate (TOPROL-XL) 100 MG 24 hr tablet TAKE 1 TABLET BY MOUTH EVERY DAY WITH OR IMMEDIATELY FOLLOWING A MEAL   Multiple Vitamins-Minerals (CENTRUM WOMEN PO) Take by mouth daily in the afternoon.   omeprazole (PRILOSEC) 20 MG capsule Take 1 capsule (20 mg total) by mouth 2 (two) times daily before a meal.   polyethylene glycol powder (GLYCOLAX/MIRALAX) 17 GM/SCOOP powder Take 17 g by mouth 2 (two) times daily as needed.   senna (SENOKOT)  8.6 MG tablet Take 1 tablet (8.6 mg total) by mouth daily.   Spacer/Aero-Holding Chambers (AEROCHAMBER MV) inhaler Use as instructed   traMADol (ULTRAM) 50 MG tablet Take 1 tablet (50 mg total) by mouth every 6 (six) hours as needed.   No facility-administered encounter medications on file as of 11/14/2022.    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 Marry Guan   Past Surgical History:  Procedure Laterality Date   ELBOW SURGERY Right [place] LITHOTRIPSY Left 01/22/2020   Procedure: EXTRACORPOREAL SHOCK WAVE LITHOTRIPSY (ESWL);  Surgeon: Abbie Sons, MD;  Location: [facility] ORS;  Service: Urology;  Laterality: Left;   EYE SURGERY  1995   KNEE ARTHROSCOPY WITH LATERAL MENISECTOMY Right 03/04/2020   Procedure: KNEE ARTHROSCOPY WITH PARTIAL LATERAL MENISECTOMY;  Surgeon: Hessie Knows, MD;  Location: [facility] ORS;  Service: Orthopedics;  Laterality: Right;   LEG SURGERY  1985   SHOULDER SURGERY     SUBACROMIAL DECOMPRESSION  2000   Right shoulder, [place]   TONSILLECTOMY  2001   Family History  Problem Relation Age of Onset   Diabetes Mother    Coronary artery disease Mother    Hyperlipidemia Mother    Hypertension Mother    Parkinson's disease Mother    Heart disease Maternal Grandfather    Stroke Father    Social History   Socioeconomic History   Marital status: Single    Spouse name: Not on file   Number of children: Not on file   Years of education: Not on file   Highest education level: Not on file  Occupational History   Not on file  Tobacco Use   Smoking status: Never   Smokeless tobacco: Never  Vaping Use   Vaping Use: Never used  Substance and Sexual Activity   Alcohol use: No   Drug use: No   Sexual activity: Yes    Birth control/protection: Injection  Other Topics Concern   Not on file  Social History Narrative   Not on file   Social Determinants of Health   Financial Resource Strain: Low Risk  (11/14/2022)   Overall Financial Resource Strain (CARDIA)    Difficulty of Paying Living Expenses: Not hard at all  Food  Insecurity: No Food Insecurity (11/14/2022)   Hunger Vital Sign    Worried About Running Out of Food in the Last Year: Never true    Ran Out of Food in the Last Year: Never true  Transportation Needs: No Transportation Needs (11/14/2022)   PRAPARE - Hydrologist (Medical): No    Lack of Transportation (Non-Medical): No  Physical Activity: Insufficiently Active (11/14/2022)   Exercise Vital Sign    Days of Exercise per Week: 2 days    Minutes of Exercise per Session: 10 min  Stress: No Stress Concern Present (11/14/2022)   [place]  [facility]    Feeling of Stress : Not at all  Social Connections: Unknown (11/14/2022)   Social Connection and Isolation Panel [NHANES]    Frequency of Communication with Friends and Family: Not on file    Frequency of Social Gatherings with Friends and Family: More than three times a week    Attends Religious Services: Not on Advertising copywriter or Organizations: Not on file    Attends Archivist Meetings: Not on file    Marital Status: Never married    Tobacco Counseling Counseling given: Not Answered   Clinical Intake:  Pre-visit preparation completed: Yes        Diabetes: No  How often do you need to have someone help you when you read instructions, pamphlets, or other written materials from your doctor or pharmacy?: 3 - Sometimes    Interpreter Needed?: No      Activities of Daily Living    11/14/2022    2:11 PM  In your present state of health, do you have any difficulty performing the following activities:  Hearing? 0  Vision? 0  Difficulty concentrating or making decisions? 0  Comment Family assist as needed  Walking or climbing stairs? 0  Dressing or bathing? 0  Doing errands, shopping? 1  Comment Family [place] and eating ? Y  Comment Mom prepares meals. Self feeds.  Using the Toilet? N  In the past six  months, have you accidently leaked urine? N  Do you have problems with loss of bowel control? N  Managing your Medications? Y  Comment Mom assist  Managing your Finances? Y  Comment Mom assist as needed  Housekeeping or managing your Housekeeping? Y  Comment Mom assist    Patient Care Team: Crecencio Mc, MD as PCP - General (Internal Medicine)  Indicate any recent Medical Services you may have received from other than [facility] providers in the past year (date may be approximate).     Assessment:   This is a routine wellness examination for Karesa.  I connected with  [name] on 11/14/22 by a audio enabled telemedicine application and verified that I am speaking with the correct person using two identifiers.  Patient Location: Home  Provider Location: Office/Clinic  I discussed the limitations of evaluation and management by telemedicine. The patient expressed understanding and agreed to proceed.   Hearing/Vision screen Hearing Screening - Comments:: Patient is able to hear conversational tones without difficulty.  No issues reported.   Vision Screening - Comments:: Followed by [facility] Annual visits She does not wear glasses    Dietary issues and exercise activities discussed: Current Exercise Habits: Home exercise routine, Type of exercise: walking, Time (Minutes): 10, Frequency (Times/Week): 2, Weekly Exercise (Minutes/Week): 20, Intensity: Mild   Goals Addressed               This Visit's Progress     Patient Stated     Healthy Lifestyle (pt-stated)   On track     Stay active Stay hydrated Healthy diet       Depression Screen    11/14/2022    2:17 PM 11/01/2022   11:04 AM 12/13/2021    2:40 PM 11/29/2021    4:15 PM 11/17/2021    3:15 PM 11/09/2021    1:24 PM 09/07/2021    4:17 PM  PHQ 2/9 Scores  PHQ - 2 Score 0 0 0  0 0 0 1  PHQ- 9 Score     1      Fall Risk    11/14/2022    2:15 PM 12/13/2021    2:40 PM 11/29/2021    4:15 PM 11/09/2021     1:29 PM 09/07/2021    4:17 PM  [place] in the past year? 0 1 1 0 1  Number falls in past yr: 0 1 1 0 1  Injury with Fall? 0 0 0  0  Risk for fall due to : No Fall Risks    History of fall(s)  Follow up Falls evaluation completed Falls evaluation completed Falls evaluation completed Falls evaluation completed Falls evaluation completed    [place]: Home free of loose throw rugs in walkways, pet beds, electrical cords, etc? Yes  Adequate lighting in your home to reduce risk of falls? Yes   ASSISTIVE DEVICES UTILIZED TO PREVENT FALLS: Life alert? No  Use of a cane, walker or w/c? No   TIMED UP AND GO: Was the test performed? No .   Cognitive Function:    09/05/2017    3:45 PM  MMSE - Mini Mental State Exam  Orientation to time 5  Orientation to Place 5  Registration 3  Attention/ Calculation 5  Recall 3  Language- name 2 objects 2  Language- repeat 1  Language- follow 3 step command 3  Language- read & follow direction 1  Write a sentence 1  Copy design 1  Total score 30        11/14/2022    2:17 PM 09/27/2020   10:39 AM 09/25/2019   10:55 AM 09/06/2018   12:31 PM  6CIT Screen  What Year? 0 points 0 points 0 points 0 points  What month? 0 points 0 points 0 points 0 points  What time? 0 points  0 points 0 points  Count back from 20 0 points  0 points 0 points  Months in reverse 0 points 0 points 0 points 0 points  Repeat phrase 0 points  0 points 0 points  Total Score 0 points  0 points 0 points    Immunizations Immunization History  Administered Date(s) Administered   Influenza Split 09/01/2013   Influenza,inj,Quad PF,6+ Mos 09/13/2017, 09/01/2019   Influenza-Unspecified 08/21/2014, 08/31/2015, 08/18/2016, 08/22/2018, 09/18/2020, 08/27/2021, 08/17/2022   PFIZER(Purple Top)SARS-COV-2 Vaccination 03/15/2020, 04/07/2020, 12/20/2020, 09/18/2021   Tdap 09/14/2015   Screening Tests Health Maintenance  Topic Date  Due   COVID-19 Vaccine (5 - 2023-24 season) 11/17/2022 (Originally 08/11/2022)   PAP SMEAR-Modifier  11/19/2023 (Originally 11/05/2021)   Medicare Annual Wellness (AWV)  11/15/2023   DTaP/Tdap/Td (2 - Td or Tdap) 09/13/2025   INFLUENZA VACCINE  Completed   Hepatitis C Screening  Completed   HIV Screening  Completed   HPV VACCINES  Aged Out   Health Maintenance There are no preventive care reminders to display for this patient.  Mammogram- patient reports this service is to be ordered/scheduled by Dr. Marcelline Mates at upcoming physical on 01/02/23. Last MM 01/03/22. Deferred ordering per patient preference.   Lung Cancer Screening: (Low Dose CT Chest recommended if Age 31-80 years, 30 pack-year currently smoking OR have quit w/in 15years.) does not qualify.   Vision Screening: Recommended annual ophthalmology exams for early detection of glaucoma and other disorders of the eye.  Dental Screening: Recommended annual dental exams for proper oral hygiene.  Community Resource Referral / Chronic Care Management: CRR required  this visit?  No   CCM required this visit?  No      Plan:     I have personally reviewed and noted the following in the patient's chart:   Medical and social history Use of alcohol, tobacco or illicit drugs  Current medications and supplements including opioid prescriptions. Patient is not currently taking opioid prescriptions. Functional ability and status Nutritional status Physical activity Advanced directives List of other physicians Hospitalizations, surgeries, and ER visits in previous 12 months Vitals Screenings to include cognitive, depression, and falls Referrals and appointments  In addition, I have reviewed and discussed with patient certain preventive protocols, quality metrics, and best practice recommendations. A written personalized care plan for preventive services as well as general preventive health recommendations were provided to patient.      [place], LPN   [id number]     I have reviewed the above information and agree with above.   Deborra Medina, MD

## 2022-11-14 NOTE — Patient Instructions (Addendum)
Margaret Zuniga , Thank you for taking time to come for your Medicare Wellness Visit. I appreciate your ongoing commitment to your health goals. Please review the following plan we discussed and let me know if I can assist you in the future.   These are the goals we discussed:  Goals       Patient Stated     Healthy Lifestyle (pt-stated)      Stay active Stay hydrated Healthy diet        This is a list of the screening recommended for you and due dates:  Health Maintenance  Topic Date Due   COVID-19 Vaccine (5 - 2023-24 season) 11/17/2022*   Pap Smear  11/19/2023*   Medicare Annual Wellness Visit  11/15/2023   DTaP/Tdap/Td vaccine (2 - Td or Tdap) 09/13/2025   Flu Shot  Completed   Hepatitis C Screening: USPSTF Recommendation to screen - Ages 18-79 yo.  Completed   HIV Screening  Completed   HPV Vaccine  Aged Out  *Topic was postponed. The date shown is not the original due date.    Advanced directives: End of life planning; Advance aging; Advanced directives discussed.  Copy of current HCPOA/Living Will requested.    Conditions/risks identified: none new  Next appointment: Follow up in one year for your annual wellness visit.   Preventive Care 40-64 Years, Female Preventive care refers to lifestyle choices and visits with your health care provider that can promote health and wellness. What does preventive care include? A yearly physical exam. This is also called an annual well check. Dental exams once or twice a year. Routine eye exams. Ask your health care provider how often you should have your eyes checked. Personal lifestyle choices, including: Daily care of your teeth and gums. Regular physical activity. Eating a healthy diet. Avoiding tobacco and drug use. Limiting alcohol use. Practicing safe sex. Taking low-dose aspirin daily starting at age 13. Taking vitamin and mineral supplements as recommended by your health care provider. What happens during an annual well  check? The services and screenings done by your health care provider during your annual well check will depend on your age, overall health, lifestyle risk factors, and family history of disease. Counseling  Your health care provider may ask you questions about your: Alcohol use. Tobacco use. Drug use. Emotional well-being. Home and relationship well-being. Sexual activity. Eating habits. Work and work Statistician. Method of birth control. Menstrual cycle. Pregnancy history. Screening  You may have the following tests or measurements: Height, weight, and BMI. Blood pressure. Lipid and cholesterol levels. These may be checked every 5 years, or more frequently if you are over 84 years old. Skin check. Lung cancer screening. You may have this screening every year starting at age 11 if you have a 30-pack-year history of smoking and currently smoke or have quit within the past 15 years. Fecal occult blood test (FOBT) of the stool. You may have this test every year starting at age 66. Flexible sigmoidoscopy or colonoscopy. You may have a sigmoidoscopy every 5 years or a colonoscopy every 10 years starting at age 53. Hepatitis C blood test. Hepatitis B blood test. Sexually transmitted disease (STD) testing. Diabetes screening. This is done by checking your blood sugar (glucose) after you have not eaten for a while (fasting). You may have this done every 1-3 years. Mammogram. This may be done every 1-2 years. Talk to your health care provider about when you should start having regular mammograms. This may depend on  whether you have a family history of breast cancer. BRCA-related cancer screening. This may be done if you have a family history of breast, ovarian, tubal, or peritoneal cancers. Pelvic exam and Pap test. This may be done every 3 years starting at age 21. Starting at age 30, this may be done every 5 years if you have a Pap test in combination with an HPV test. Bone density scan. This  is done to screen for osteoporosis. You may have this scan if you are at high risk for osteoporosis. Discuss your test results, treatment options, and if necessary, the need for more tests with your health care provider. Vaccines  Your health care provider may recommend certain vaccines, such as: Influenza vaccine. This is recommended every year. Tetanus, diphtheria, and acellular pertussis (Tdap, Td) vaccine. You may need a Td booster every 10 years. Zoster vaccine. You may need this after age 60. Pneumococcal 13-valent conjugate (PCV13) vaccine. You may need this if you have certain conditions and were not previously vaccinated. Pneumococcal polysaccharide (PPSV23) vaccine. You may need one or two doses if you smoke cigarettes or if you have certain conditions. Talk to your health care provider about which screenings and vaccines you need and how often you need them. This information is not intended to replace advice given to you by your health care provider. Make sure you discuss any questions you have with your health care provider. Document Released: 12/24/2015 Document Revised: 08/16/2016 Document Reviewed: 09/28/2015 Elsevier Interactive Patient Education  2017 Elsevier Inc.    Fall Prevention in the Home Falls can cause injuries. They can happen to people of all ages. There are many things you can do to make your home safe and to help prevent falls. What can I do on the outside of my home? Regularly fix the edges of walkways and driveways and fix any cracks. Remove anything that might make you trip as you walk through a door, such as a raised step or threshold. Trim any bushes or trees on the path to your home. Use bright outdoor lighting. Clear any walking paths of anything that might make someone trip, such as rocks or tools. Regularly check to see if handrails are loose or broken. Make sure that both sides of any steps have handrails. Any raised decks and porches should have  guardrails on the edges. Have any leaves, snow, or ice cleared regularly. Use sand or salt on walking paths during winter. Clean up any spills in your garage right away. This includes oil or grease spills. What can I do in the bathroom? Use night lights. Install grab bars by the toilet and in the tub and shower. Do not use towel bars as grab bars. Use non-skid mats or decals in the tub or shower. If you need to sit down in the shower, use a plastic, non-slip stool. Keep the floor dry. Clean up any water that spills on the floor as soon as it happens. Remove soap buildup in the tub or shower regularly. Attach bath mats securely with double-sided non-slip rug tape. Do not have throw rugs and other things on the floor that can make you trip. What can I do in the bedroom? Use night lights. Make sure that you have a light by your bed that is easy to reach. Do not use any sheets or blankets that are too big for your bed. They should not hang down onto the floor. Have a firm chair that has side arms. You can use this   for support while you get dressed. Do not have throw rugs and other things on the floor that can make you trip. What can I do in the kitchen? Clean up any spills right away. Avoid walking on wet floors. Keep items that you use a lot in easy-to-reach places. If you need to reach something above you, use a strong step stool that has a grab bar. Keep electrical cords out of the way. Do not use floor polish or wax that makes floors slippery. If you must use wax, use non-skid floor wax. Do not have throw rugs and other things on the floor that can make you trip. What can I do with my stairs? Do not leave any items on the stairs. Make sure that there are handrails on both sides of the stairs and use them. Fix handrails that are broken or loose. Make sure that handrails are as long as the stairways. Check any carpeting to make sure that it is firmly attached to the stairs. Fix any carpet  that is loose or worn. Avoid having throw rugs at the top or bottom of the stairs. If you do have throw rugs, attach them to the floor with carpet tape. Make sure that you have a light switch at the top of the stairs and the bottom of the stairs. If you do not have them, ask someone to add them for you. What else can I do to help prevent falls? Wear shoes that: Do not have high heels. Have rubber bottoms. Are comfortable and fit you well. Are closed at the toe. Do not wear sandals. If you use a stepladder: Make sure that it is fully opened. Do not climb a closed stepladder. Make sure that both sides of the stepladder are locked into place. Ask someone to hold it for you, if possible. Clearly mark and make sure that you can see: Any grab bars or handrails. First and last steps. Where the edge of each step is. Use tools that help you move around (mobility aids) if they are needed. These include: Canes. Walkers. Scooters. Crutches. Turn on the lights when you go into a dark area. Replace any light bulbs as soon as they burn out. Set up your furniture so you have a clear path. Avoid moving your furniture around. If any of your floors are uneven, fix them. If there are any pets around you, be aware of where they are. Review your medicines with your doctor. Some medicines can make you feel dizzy. This can increase your chance of falling. Ask your doctor what other things that you can do to help prevent falls. This information is not intended to replace advice given to you by your health care provider. Make sure you discuss any questions you have with your health care provider. Document Released: 09/23/2009 Document Revised: 05/04/2016 Document Reviewed: 01/01/2015 Elsevier Interactive Patient Education  2017 [employer].

## 2022-11-17 ENCOUNTER — Ambulatory Visit: Payer: Medicare Other

## 2022-11-17 ENCOUNTER — Ambulatory Visit (INDEPENDENT_AMBULATORY_CARE_PROVIDER_SITE_OTHER): Payer: Medicare Other

## 2022-11-17 VITALS — BP 118/78 | HR 87 | Resp 16 | Ht 63.0 in | Wt 201.8 lb

## 2022-11-17 DIAGNOSIS — Z3042 Encounter for surveillance of injectable contraceptive: Secondary | ICD-10-CM

## 2022-11-17 MED ORDER — MEDROXYPROGESTERONE ACETATE 150 MG/ML IM SUSP
150.0000 mg | Freq: Once | INTRAMUSCULAR | Status: AC
  Administered 2022-11-17: 150 mg via INTRAMUSCULAR

## 2022-11-17 NOTE — Progress Notes (Signed)
    NURSE VISIT NOTE  Subjective:    Patient ID: Margaret Zuniga, female    DOB: 08/06/1981, 41 y.o.   MRN: [id number]  HPI  Patient is a 41 y.o. G0P0000 female who presents for surveillance of depo provera injection.  Date last pap: 11/05/2018. Last Depo-Provera: 09/01/2022. Side Effects if any: None. Serum HCG indicated? N/A. Depo-Provera 150 mg IM given by: Cristy Folks, CMA. Next appointment due : Feb. 23 - March 9   The following portions of the patient's history were reviewed and updated as appropriate: allergies, current medications, past family history, past medical history, past social history, past surgical history, and problem list.  Review of Systems Pertinent items are noted in HPI.   Objective:   Blood pressure 118/78, pulse 87, resp. rate 16, height '5\' 3"'$  (1.6 m), weight 201 lb 12.8 oz (91.5 kg). Body mass index is 35.75 kg/m.  General appearance: alert, cooperative, and no distress   Assessment:   1. Encounter for surveillance of injectable contraceptive      Plan:   Follow up in 3 months for next depo injection (Feb. 23 - March 9)   Cristy Folks, [place]

## 2022-11-17 NOTE — Patient Instructions (Incomplete)

## 2022-11-18 ENCOUNTER — Encounter: Payer: Self-pay | Admitting: Family Medicine

## 2022-11-18 NOTE — Assessment & Plan Note (Signed)
Doing well on Lexapro 10 mg daily.  Denies any SI/HI Continue to follow-up PCP for further management.

## 2022-11-18 NOTE — Assessment & Plan Note (Signed)
Chronic.  Doing well Follow up with PCP

## 2022-11-18 NOTE — Assessment & Plan Note (Signed)
Continues to have right-sided pain under breast area.  Intermittent sharp pain with deep inspiration.  Most recent workup in urgent care included chest x-ray which was reassuring.  Prior to this was had right upper quadrant ultrasound which was negative for gallstones and chest x-ray which also was negative for any acute cardiopulmonary process. Reviewed CT renal study from 08/2022, shows stool in right ascending colon without distention.  Suspect constipation as possible etiology of continued pain. Will start bowel regime Increase water intake Increase fruits and vegetables If worsening symptoms follow-up with PCP or return to urgent care.

## 2022-11-24 IMAGING — MG DIGITAL SCREENING BILAT W/ TOMO W/ CAD
8 series · 8 of 24 positions shown · non-contrast
Comparison: Previous exam(s).

CLINICAL DATA: Screening.

EXAM:
DIGITAL SCREENING BILATERAL MAMMOGRAM WITH TOMO AND CAD

[R CC synth-2D]
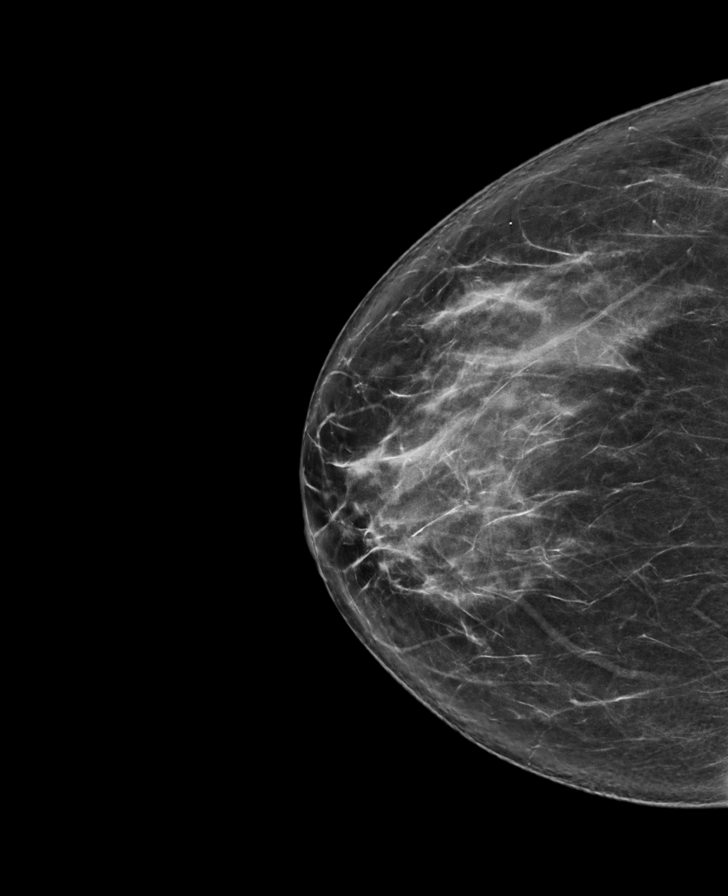

[R MLO synth-2D]
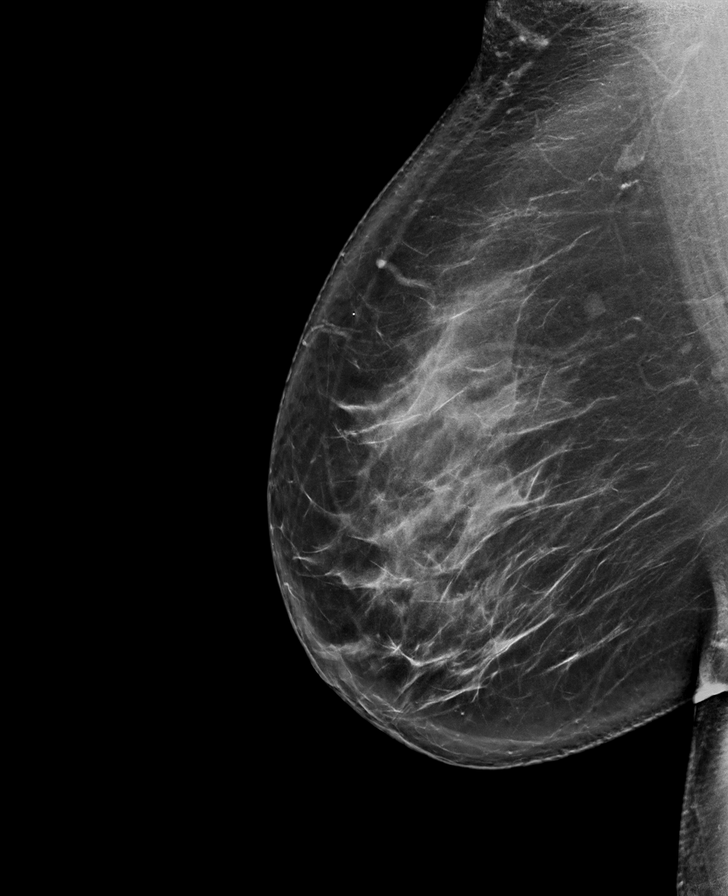

[L MLO synth-2D]
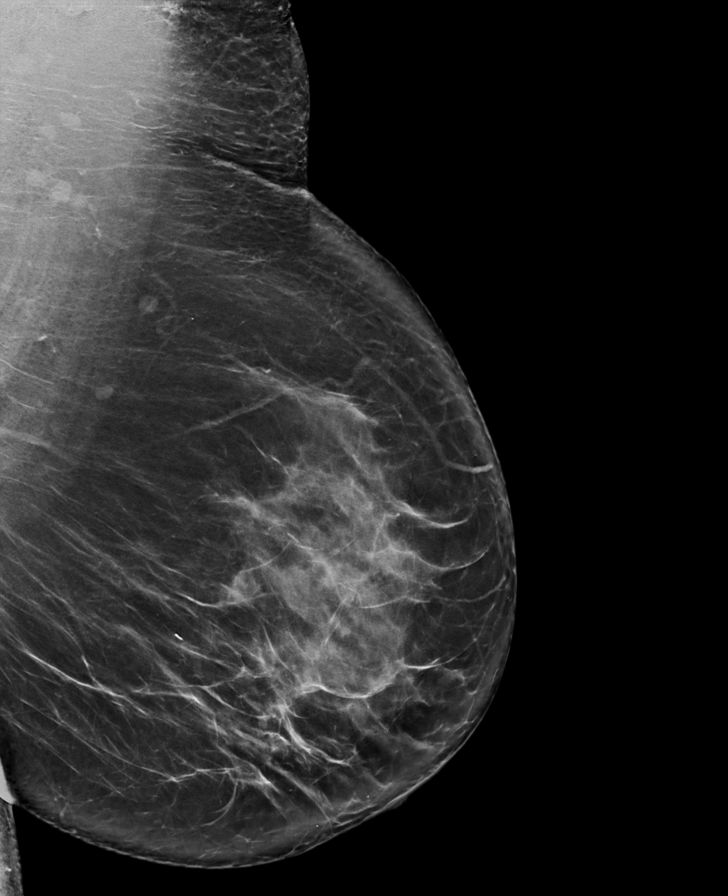

[L CC synth-2D]
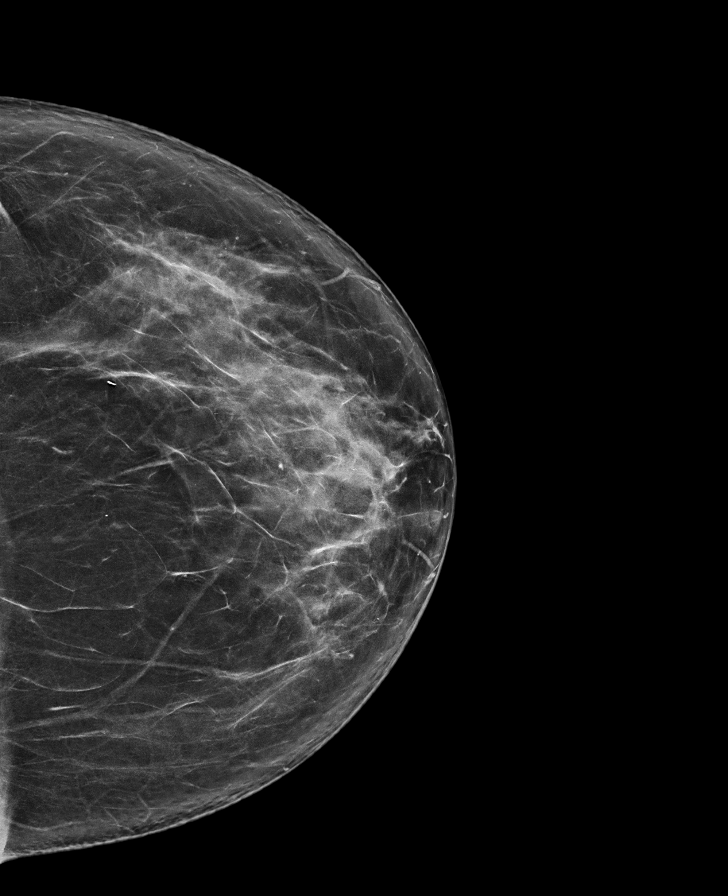

[R CC tomo · tomo slice 42/83.0]
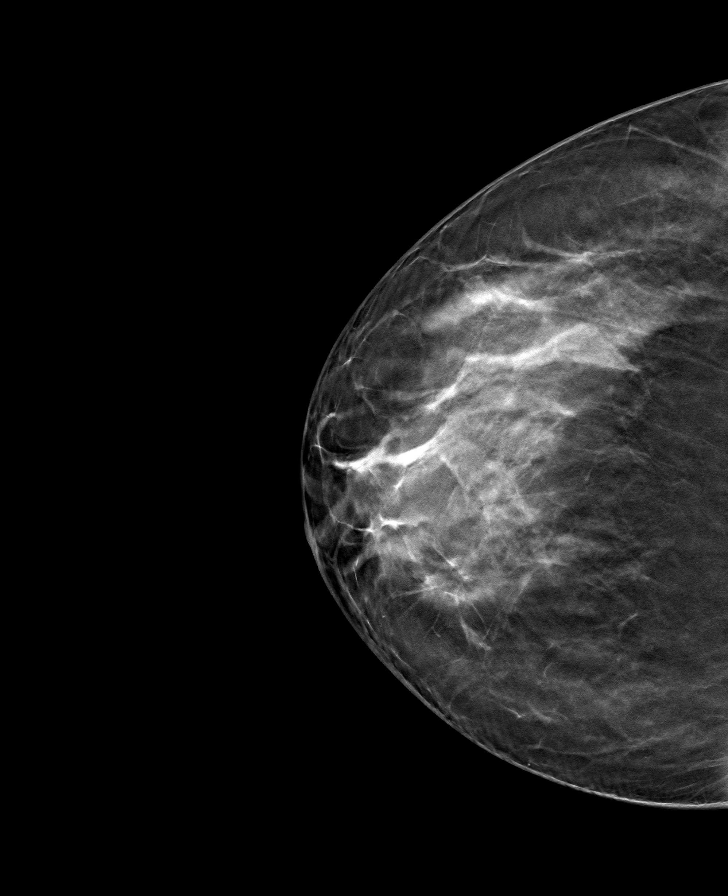

[L CC tomo · tomo slice 43/86.0]
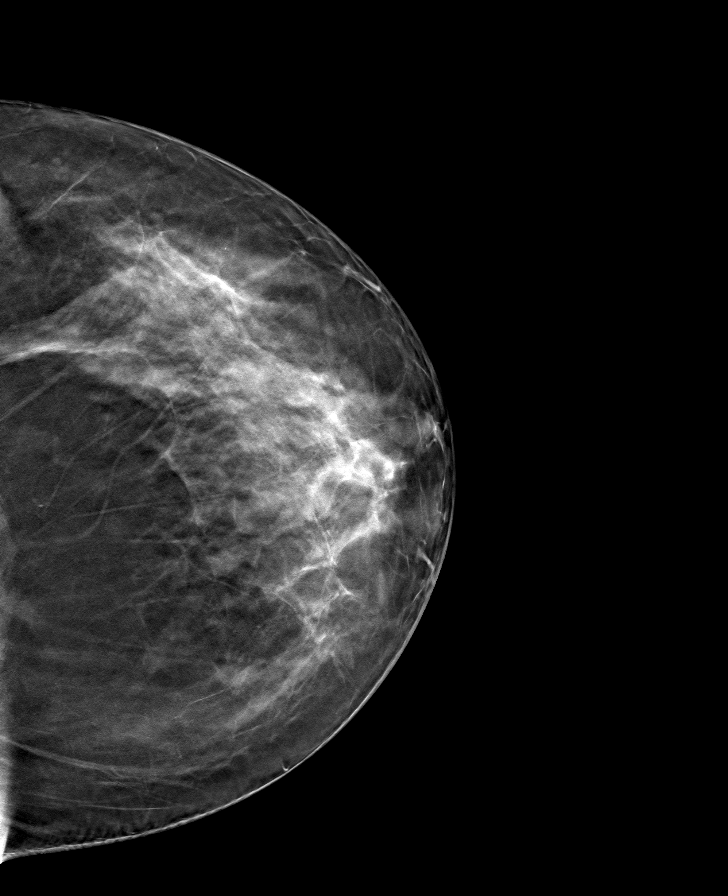

[R MLO tomo · tomo slice 49/96.0]
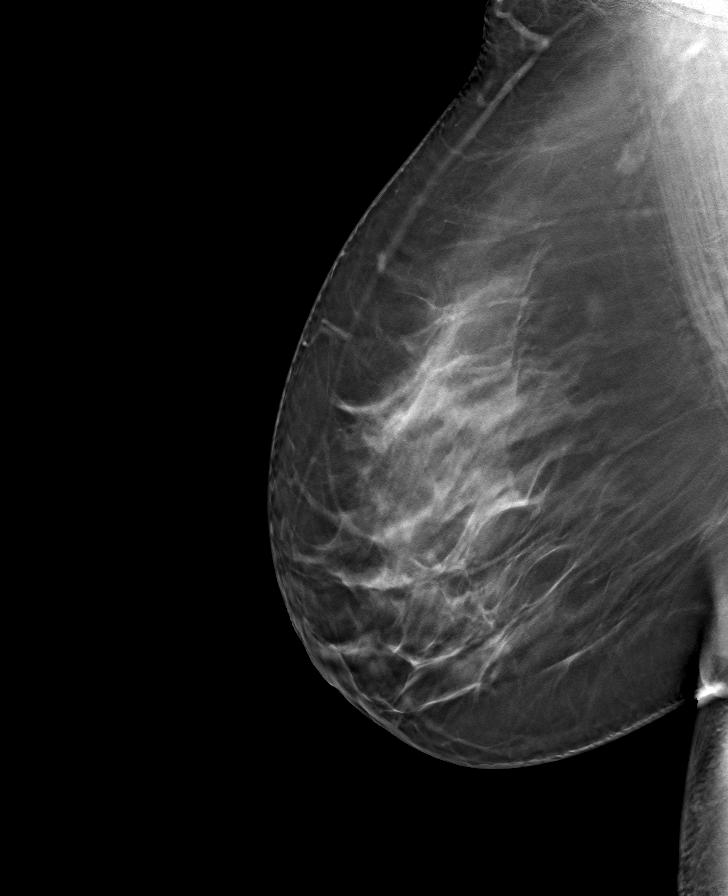

[L MLO tomo · tomo slice 49/96.0]
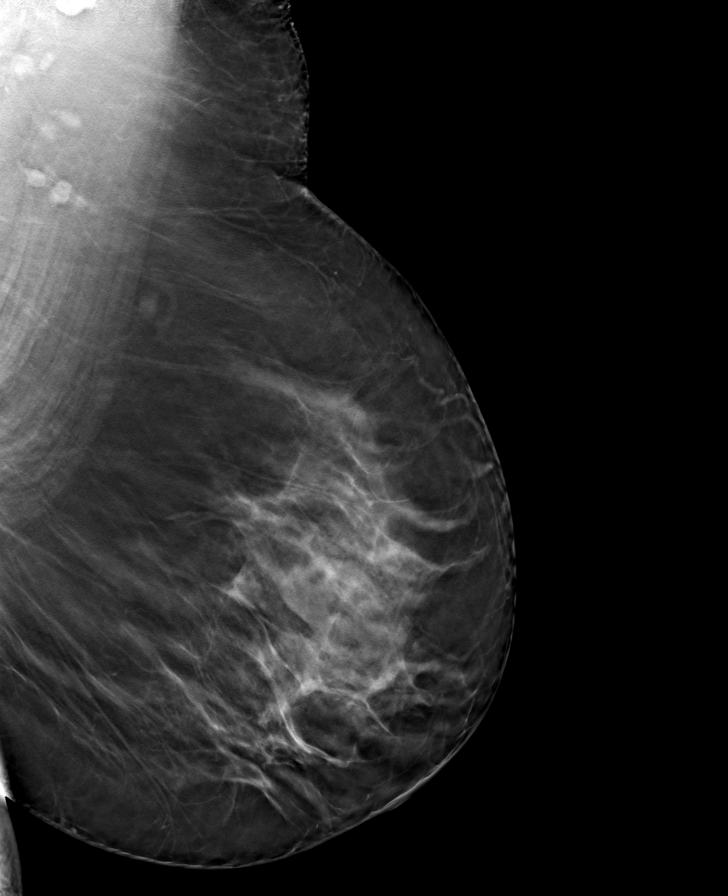

[8 of 24 positions shown; findings below may reference images not displayed]

ACR Breast Density Category c: The breast tissue is heterogeneously
dense, which may obscure small masses.
FINDINGS: There are no findings suspicious for malignancy. Images were
processed with CAD.
IMPRESSION: No mammographic evidence of malignancy. A result letter of this
screening mammogram will be mailed directly to the patient.

RECOMMENDATION:
Screening mammogram in one year. (Code:[id number])

BI-RADS CATEGORY  1: Negative.

## 2022-11-28 DIAGNOSIS — M542 Cervicalgia: Secondary | ICD-10-CM | POA: Diagnosis not present

## 2022-11-28 DIAGNOSIS — G43719 Chronic migraine without aura, intractable, without status migrainosus: Secondary | ICD-10-CM | POA: Diagnosis not present

## 2022-11-28 DIAGNOSIS — G518 Other disorders of facial nerve: Secondary | ICD-10-CM | POA: Diagnosis not present

## 2022-11-28 DIAGNOSIS — M791 Myalgia, unspecified site: Secondary | ICD-10-CM | POA: Diagnosis not present

## 2023-01-02 ENCOUNTER — Ambulatory Visit (INDEPENDENT_AMBULATORY_CARE_PROVIDER_SITE_OTHER): Payer: 59 | Admitting: Obstetrics and Gynecology

## 2023-01-02 ENCOUNTER — Encounter: Payer: Self-pay | Admitting: Obstetrics and Gynecology

## 2023-01-02 ENCOUNTER — Other Ambulatory Visit (HOSPITAL_COMMUNITY)
Admission: RE | Admit: 2023-01-02 | Discharge: 2023-01-02 | Disposition: A | Discharge status: Home / Self Care | Payer: 59 | Source: Ambulatory Visit | Attending: Obstetrics and Gynecology | Admitting: Obstetrics and Gynecology

## 2023-01-02 VITALS — BP 121/80 | HR 89 | Resp 16 | Ht 63.0 in | Wt 197.0 lb

## 2023-01-02 DIAGNOSIS — Z13220 Encounter for screening for lipoid disorders: Secondary | ICD-10-CM | POA: Diagnosis not present

## 2023-01-02 DIAGNOSIS — Z1231 Encounter for screening mammogram for malignant neoplasm of breast: Secondary | ICD-10-CM

## 2023-01-02 DIAGNOSIS — Z3042 Encounter for surveillance of injectable contraceptive: Secondary | ICD-10-CM

## 2023-01-02 DIAGNOSIS — Z124 Encounter for screening for malignant neoplasm of cervix: Secondary | ICD-10-CM

## 2023-01-02 DIAGNOSIS — Z131 Encounter for screening for diabetes mellitus: Secondary | ICD-10-CM | POA: Diagnosis not present

## 2023-01-02 DIAGNOSIS — Z01419 Encounter for gynecological examination (general) (routine) without abnormal findings: Secondary | ICD-10-CM | POA: Insufficient documentation

## 2023-01-02 DIAGNOSIS — Z1151 Encounter for screening for human papillomavirus (HPV): Secondary | ICD-10-CM | POA: Insufficient documentation

## 2023-01-02 DIAGNOSIS — I10 Essential (primary) hypertension: Secondary | ICD-10-CM

## 2023-01-02 NOTE — Progress Notes (Signed)
GYNECOLOGY ANNUAL PHYSICAL EXAM PROGRESS NOTE  Subjective:    Margaret Zuniga is a 42 y.o. [place] female who presents for an annual exam. The patient has no complaints today. The patient is sexually active. The patient wears seatbelts: yes. The patient participates in regular exercise: no. Has the patient ever been transfused or tattooed?: no.     Gynecologic History Menarche age: 52 No LMP recorded. Patient has had an injection. Contraception: Depo-Provera injections History of STI's:  Denies Last Pap: 10/2018. Results were: normal.  Denies h/o abnormal pap smears Last mammogram: 01/03/2022. Results were: normal.   Dexa scan (due to long-term Depo-Provera use): 11/2020. Results were: normal.    Upstream - 01/02/23 1419       Pregnancy Intention Screening   Does the patient want to become pregnant in the next year? No    Does the patient's partner want to become pregnant in the next year? No    Would the patient like to discuss contraceptive options today? No      Contraception Wrap Up   Current Method Hormonal Injection    End Method Hormonal Injection    Contraception Counseling Provided No    How was the end contraceptive method provided? N/A             Upstream - 01/02/23 1419       Pregnancy Intention Screening   Does the patient want to become pregnant in the next year? No    Does the patient's partner want to become pregnant in the next year? No    Would the patient like to discuss contraceptive options today? No      Contraception Wrap Up   Current Method Hormonal Injection    End Method Hormonal Injection    Contraception Counseling Provided No    How was the end contraceptive method provided? N/A             11/15/2021   1119  Pregnancy Intention Screening   Does the patient want to become pregnant in the next year? No  Does the patient's partner want to become pregnant in the next year? No  Does the patient currently take folic acid or women's  MVI, or a prenatal viitamin? Yes  Does the patient or their partner want to learn more about planning a healthy pregnancy? No  Would the patient like to discuss contraceptive options today? No  Contraception Wrap Up   Current Method Hormonal Injection  End Method Hormonal Injection  Contraception Counseling Provided No    Upstream - 01/02/23 1419       Pregnancy Intention Screening   Does the patient want to become pregnant in the next year? No    Does the patient's partner want to become pregnant in the next year? No    Would the patient like to discuss contraceptive options today? No      Contraception Wrap Up   Current Method Hormonal Injection    End Method Hormonal Injection    Contraception Counseling Provided No    How was the end contraceptive method provided? N/A             Upstream - 01/02/23 1419       Pregnancy Intention Screening   Does the patient want to become pregnant in the next year? No    Does the patient's partner want to become pregnant in the next year? No    Would the patient like to discuss contraceptive options today? No  Contraception Wrap Up   Current Method Hormonal Injection    End Method Hormonal Injection    Contraception Counseling Provided No    How was the end contraceptive method provided? N/A            The pregnancy intention screening data noted above was reviewed. Potential methods of contraception were discussed. The patient elected to proceed with Hormonal Injection.    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 Marry Guan    Past Surgical History:  Procedure Laterality Date   ELBOW SURGERY Right [place] LITHOTRIPSY Left 01/22/2020   Procedure: EXTRACORPOREAL SHOCK WAVE LITHOTRIPSY (ESWL);  Surgeon: Abbie Sons, MD;  Location: [facility] ORS;  Service: Urology;  Laterality: Left;   EYE SURGERY  1995   KNEE ARTHROSCOPY WITH LATERAL MENISECTOMY Right 03/04/2020   Procedure: KNEE ARTHROSCOPY WITH PARTIAL LATERAL MENISECTOMY;  Surgeon: Hessie Knows, MD;  Location: [facility] ORS;  Service: Orthopedics;  Laterality: Right;   LEG SURGERY  1985   SHOULDER SURGERY     SUBACROMIAL DECOMPRESSION  2000   Right shoulder, [place]   TONSILLECTOMY  2001    Family History  Problem Relation Age of Onset   Diabetes Mother    Coronary artery disease Mother    Hyperlipidemia Mother    Hypertension Mother    Parkinson's disease Mother    Heart disease Maternal Grandfather    Stroke Father     Social History   Socioeconomic History   Marital status: Single    Spouse name: Not on file   Number of children: Not on file   Years of education: Not on file   Highest education level: Not on file  Occupational History   Not on file  Tobacco Use   Smoking status: Never   Smokeless tobacco: Never  Vaping Use   Vaping Use: Never used  Substance and Sexual Activity   Alcohol use: No   Drug use: No   Sexual activity: Yes    Birth control/protection: Injection  Other Topics Concern   Not on file  Social History Narrative   Not on file   Social Determinants of Health   Financial Resource Strain: Low Risk  (11/14/2022)   Overall Financial Resource Strain (CARDIA)    Difficulty of Paying Living Expenses: Not hard at all   Food Insecurity: No Food Insecurity (11/14/2022)   Hunger Vital Sign    Worried About Running Out of Food in the Last Year: Never true    Ran Out of Food in the Last Year: Never true  Transportation Needs: No Transportation Needs (11/14/2022)   PRAPARE - Hydrologist (Medical): No    Lack of Transportation (Non-Medical):  No  Physical Activity: Insufficiently Active (11/14/2022)   Exercise Vital Sign    Days of Exercise per Week: 2 days    Minutes of Exercise per Session: 10 min  Stress: No Stress Concern Present (11/14/2022)   [place]    Feeling of Stress : Not at all  Social Connections: Unknown (11/14/2022)   Social Connection and Isolation Panel [NHANES]    Frequency of Communication with Friends and Family: Not on file    Frequency of Social Gatherings with Friends and Family: More than three times a week    Attends Religious Services: Not on file    Active Member of Clubs or Organizations: Not on file    Attends Archivist Meetings: Not on file    Marital Status: Never married  Intimate Partner Violence: Not At Risk (11/14/2022)   Humiliation, Afraid, Rape, and Kick questionnaire    Fear of Current or Ex-Partner: No    Emotionally Abused: No    Physically Abused: No    Sexually Abused: No    Current Outpatient Medications on File Prior to Visit  Medication Sig Dispense Refill   Acetaminophen-Caffeine 500-65 MG TABS Take 1 tablet by mouth in the morning, at noon, and at bedtime.      albuterol (VENTOLIN HFA) 108 (90 Base) MCG/ACT inhaler Inhale 2 puffs into the lungs every 6 (six) hours as needed for wheezing or shortness of breath.     amLODipine (NORVASC) 10 MG tablet TAKE 1 TABLET BY MOUTH EVERY DAY 90 tablet 1   Ascorbic Acid (VITAMIN C) 500 MG CAPS Take 1 tablet by mouth.     Atogepant (QULIPTA) 60 MG TABS      baclofen (LIORESAL) 10 MG tablet Take 10 mg by mouth 2  (two) times daily as needed.     carbamazepine (CARBATROL) 300 MG 12 hr capsule Take 300 mg by mouth in the morning, at noon, and at bedtime.   1   Cholecalciferol (VITAMIN D3) 25 MCG (1000 UT) CAPS Take 1 capsule by mouth.     diphenhydrAMINE HCl, Sleep, (ZZZQUIL PO) Take by mouth at bedtime.     escitalopram (LEXAPRO) 10 MG tablet TAKE 1 TABLET BY MOUTH EVERY DAY 90 tablet 1   fluticasone (FLONASE) 50 MCG/ACT nasal spray Place 2 sprays into both nostrils daily. 16 g 6   fluticasone furoate-vilanterol (BREO ELLIPTA) 100-25 MCG/INH AEPB Inhale into the lungs.     MAGNESIUM PO Take 500 mg by mouth.     medroxyPROGESTERone (DEPO-PROVERA) 150 MG/ML injection INJECT 1 ML (150 MG TOTAL) INTO THE MUSCLE EVERY 3 (THREE) MONTHS. 1 mL 3   metoprolol succinate (TOPROL-XL) 100 MG 24 hr tablet TAKE 1 TABLET BY MOUTH EVERY DAY WITH OR IMMEDIATELY FOLLOWING A MEAL 90 tablet 1   Multiple Vitamins-Minerals (CENTRUM WOMEN PO) Take by mouth daily in the afternoon.     omeprazole (PRILOSEC) 20 MG capsule Take 1 capsule (20 mg total) by mouth 2 (two) times daily before a meal. 180 capsule 3   polyethylene glycol powder (GLYCOLAX/MIRALAX) 17 GM/SCOOP powder Take 17 g by mouth 2 (two) times daily as needed. 3350 g 1   senna (SENOKOT) 8.6 MG tablet Take 1 tablet (8.6 mg total) by mouth daily. 30 tablet 0   Spacer/Aero-Holding Chambers (AEROCHAMBER MV) inhaler Use as instructed 1 each 1   No current facility-administered medications on file prior to visit.    Allergies  Allergen Reactions   Zonegran [  Zonisamide] Rash     Review of Systems Constitutional: negative for chills, fatigue, fevers and sweats Eyes: negative for irritation, redness and visual disturbance Ears, nose, mouth, throat, and face: negative for hearing loss, nasal congestion, snoring and tinnitus Respiratory: negative for asthma, cough, sputum Cardiovascular: negative for chest pain, dyspnea, exertional chest pressure/discomfort, irregular  heart beat, palpitations and syncope Gastrointestinal: negative for abdominal pain, change in bowel habits, nausea and vomiting Genitourinary: negative for abnormal menstrual periods, genital lesions, sexual problems and vaginal discharge, dysuria and urinary incontinence Integument/breast: negative for breast lump, breast tenderness and nipple discharge Hematologic/lymphatic: negative for bleeding and easy bruising Musculoskeletal:negative for back pain and muscle weakness Neurological: negative for dizziness, headaches, vertigo and weakness Endocrine: negative for diabetic symptoms including polydipsia, polyuria and skin dryness Allergic/Immunologic: negative for hay fever and urticaria        Objective:  Blood pressure 121/80, pulse 89, resp. rate 16, height '5\' 3"'$  (1.6 m), weight 197 lb (89.4 kg). Body mass index is 34.9 kg/m.  General Appearance:    Alert, cooperative, no distress, appears stated age, moderately obese  Head:    Normocephalic, without obvious abnormality, atraumatic  Eyes:    PERRL, conjunctiva/corneas clear, EOM's intact, both eyes  Ears:    Normal external ear canals, both ears  Nose:   Nares normal, septum midline, mucosa normal, no drainage or sinus tenderness  Throat:   Lips, mucosa, and tongue normal; teeth and gums normal  Neck:   Supple, symmetrical, trachea midline, no adenopathy; thyroid: no enlargement/tenderness/nodules; no carotid bruit or JVD  Back:     Symmetric, no curvature, ROM normal, no CVA tenderness  Lungs:     Clear to auscultation bilaterally, respirations unlabored  Chest Wall:    No tenderness or deformity   Heart:    Regular rate and rhythm, S1 and S2 normal, no murmur, rub or gallop  Breast Exam:    No tenderness, masses, or nipple abnormality  Abdomen:     Soft, non-tender, bowel sounds active all four quadrants, no masses, no organomegaly.    Genitalia:    Pelvic:external genitalia normal, vagina without lesions, discharge, or tenderness,  rectovaginal septum  normal. Cervix normal in appearance, no cervical motion tenderness, no adnexal masses or tenderness.  Uterus normal size, shape, mobile, regular contours, nontender.  Rectal:    Normal external sphincter.  No hemorrhoids appreciated. Internal exam not done.   Extremities:   Extremities normal, atraumatic, no cyanosis or edema  Pulses:   2+ and symmetric all extremities  Skin:   Skin color, texture, turgor normal, no rashes or lesions  Lymph nodes:   Cervical, supraclavicular, and axillary nodes normal  Neurologic:  Grossly intact.  Partial paralysis of left side (face, upper and lower limb)   .  Labs:  Lab Results  Component Value Date   WBC 5.6 10/17/2022   HGB 13.0 10/17/2022   HCT 39.0 10/17/2022   MCV 92.4 10/17/2022   PLT 293.0 10/17/2022    Lab Results  Component Value Date   CREATININE 0.62 10/17/2022   BUN 14 10/17/2022   NA 139 10/17/2022   K 3.9 10/17/2022   CL 106 10/17/2022   CO2 26 10/17/2022    Lab Results  Component Value Date   ALT 25 10/17/2022   AST 20 10/17/2022   ALKPHOS 94 10/17/2022   BILITOT 0.3 10/17/2022    Lab Results  Component Value Date   TSH 1.770 11/11/2020    Lab Results  Component Value  Date   HGBA1C 5.3 11/17/2021   Lab Results  Component Value Date   CHOL 181 05/15/2019   HDL 57.70 05/15/2019   LDLCALC 103 (H) 05/15/2019   LDLDIRECT 109.0 04/17/2016   TRIG 102.0 05/15/2019   CHOLHDL 3 05/15/2019     Assessment:   Healthy female exam.  Moderate obesity  HTN Depo Provera contraception   Plan:    Labs: up to date, performed by PCP Breast self exam technique reviewed and patient encouraged to perform self-exam monthly. Contraception: Depo-Provera injections. Had normal Dexa Scan last year for history of prolonged use.  Discussed healthy lifestyle modifications. Mammogram: scheduled for next week.   Pap smear up to date. Discussed option of continued 3 year vs 5 year surveillance with HPV  co-testing. Patient ok to do 5 years. Next due in 2024.  HTN  management as recommended by PCP.  Up to date on flu vaccine.  COVID vaccination status: completed.  Can receive booster when eligible.  Follow up in 1 year for annual exam.    Rubie Maid, MD [facility]

## 2023-01-02 NOTE — Progress Notes (Signed)
GYNECOLOGY ANNUAL PHYSICAL EXAM PROGRESS NOTE  Subjective:    Margaret Zuniga is a 42 y.o. [place] female who presents for an annual exam. The patient has no complaints today. The patient is sexually active. The patient wears seatbelts: yes. The patient participates in regular exercise: no. Has the patient ever been transfused or tattooed?: no.     Gynecologic History Menarche age: 56 No LMP recorded. Patient has had an injection. Contraception: Depo-Provera injections History of STI's:  Denies Last Pap: 10/2018. Results were: normal.  Denies h/o abnormal pap smears Last mammogram: 01/03/2022. Results were: normal.   Dexa scan (due to long-term Depo-Provera use): 11/2020. Results were: normal.     Upstream - 01/02/23 1419       Pregnancy Intention Screening   Does the patient want to become pregnant in the next year? No    Does the patient's partner want to become pregnant in the next year? No    Would the patient like to discuss contraceptive options today? No      Contraception Wrap Up   Current Method Hormonal Injection    End Method Hormonal Injection    Contraception Counseling Provided No    How was the end contraceptive method provided? N/A            The pregnancy intention screening data noted above was reviewed. Potential methods of contraception were discussed. The patient elected to proceed with Hormonal Injection.    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 Marry Guan    Past Surgical History:  Procedure Laterality Date   ELBOW SURGERY Right [place] LITHOTRIPSY Left 01/22/2020   Procedure: EXTRACORPOREAL SHOCK WAVE LITHOTRIPSY (ESWL);  Surgeon: Abbie Sons, MD;  Location: [facility] ORS;  Service: Urology;  Laterality: Left;   EYE SURGERY  1995   KNEE ARTHROSCOPY WITH LATERAL MENISECTOMY Right 03/04/2020   Procedure: KNEE ARTHROSCOPY WITH PARTIAL LATERAL MENISECTOMY;  Surgeon: Hessie Knows, MD;  Location: [facility] ORS;  Service: Orthopedics;  Laterality: Right;   LEG SURGERY  1985   SHOULDER SURGERY     SUBACROMIAL DECOMPRESSION  2000   Right shoulder, [place]   TONSILLECTOMY  2001    Family History  Problem Relation Age of Onset   Diabetes Mother    Coronary artery disease Mother    Hyperlipidemia Mother    Hypertension Mother    Parkinson's disease Mother    Heart disease Maternal Grandfather    Stroke Father     Social History   Socioeconomic History   Marital status: Single    Spouse name: Not on file   Number of children: Not on file   Years of education: Not on file   Highest  education level: Not on file  Occupational History   Not on file  Tobacco Use   Smoking status: Never   Smokeless tobacco: Never  Vaping Use   Vaping Use: Never used  Substance and Sexual Activity   Alcohol use: No   Drug use: No   Sexual activity: Yes    Birth control/protection: Injection  Other Topics Concern   Not on file  Social History Narrative   Not on file   Social Determinants of Health   Financial Resource Strain: Low Risk  (11/14/2022)   Overall Financial Resource Strain (CARDIA)    Difficulty of Paying Living Expenses: Not hard at all  Food Insecurity: No Food Insecurity (11/14/2022)   Hunger Vital Sign    Worried About Running  Out of Food in the Last Year: Never true    Ran Out of Food in the Last Year: Never true  Transportation Needs: No Transportation Needs (11/14/2022)   PRAPARE - Hydrologist (Medical): No    Lack of Transportation (Non-Medical): No  Physical Activity: Insufficiently Active (11/14/2022)   Exercise Vital Sign    Days of Exercise per Week: 2 days    Minutes of Exercise per Session: 10 min  Stress: No Stress Concern Present (11/14/2022)   [place]    Feeling of Stress : Not at all  Social Connections: Unknown (11/14/2022)   Social Connection and Isolation Panel [NHANES]    Frequency of Communication with Friends and Family: Not on file    Frequency of Social Gatherings with Friends and Family: More than three times a week    Attends Religious Services: Not on file    Active Member of Clubs or Organizations: Not on file    Attends Archivist Meetings: Not on file    Marital Status: Never married  Intimate Partner Violence: Not At Risk (11/14/2022)   Humiliation, Afraid, Rape, and Kick questionnaire    Fear of Current or Ex-Partner: No    Emotionally Abused: No    Physically Abused: No    Sexually Abused: No    Current Outpatient Medications on File Prior to Visit  Medication Sig Dispense Refill   Acetaminophen-Caffeine 500-65 MG TABS Take 1 tablet by mouth in the morning, at noon, and at bedtime.      albuterol (VENTOLIN HFA) 108 (90 Base) MCG/ACT inhaler Inhale 2 puffs into the lungs every 6 (six) hours as needed for wheezing or shortness of breath.     amLODipine (NORVASC) 10 MG tablet TAKE 1 TABLET BY MOUTH EVERY DAY 90 tablet 1   Ascorbic Acid (VITAMIN C) 500 MG CAPS Take 1 tablet by mouth.     Atogepant (QULIPTA) 60 MG TABS      baclofen (LIORESAL) 10 MG tablet Take 10 mg by mouth 2 (two) times daily as needed.     carbamazepine (CARBATROL) 300 MG 12 hr capsule Take 300 mg by  mouth in the morning, at noon, and at bedtime.   1   Cholecalciferol (VITAMIN D3) 25 MCG (1000 UT) CAPS Take 1 capsule by mouth.     diphenhydrAMINE HCl, Sleep, (ZZZQUIL PO) Take by mouth at bedtime.     escitalopram (LEXAPRO) 10 MG tablet TAKE 1 TABLET BY MOUTH EVERY DAY 90 tablet 1   fluticasone (FLONASE) 50 MCG/ACT nasal spray Place 2 sprays into both nostrils daily. 16 g 6   fluticasone furoate-vilanterol (BREO ELLIPTA) 100-25 MCG/INH AEPB Inhale into  the lungs.     MAGNESIUM PO Take 500 mg by mouth.     medroxyPROGESTERone (DEPO-PROVERA) 150 MG/ML injection INJECT 1 ML (150 MG TOTAL) INTO THE MUSCLE EVERY 3 (THREE) MONTHS. 1 mL 3   metoprolol succinate (TOPROL-XL) 100 MG 24 hr tablet TAKE 1 TABLET BY MOUTH EVERY DAY WITH OR IMMEDIATELY FOLLOWING A MEAL 90 tablet 1   Multiple Vitamins-Minerals (CENTRUM WOMEN PO) Take by mouth daily in the afternoon.     omeprazole (PRILOSEC) 20 MG capsule Take 1 capsule (20 mg total) by mouth 2 (two) times daily before a meal. 180 capsule 3   polyethylene glycol powder (GLYCOLAX/MIRALAX) 17 GM/SCOOP powder Take 17 g by mouth 2 (two) times daily as needed. 3350 g 1   senna (SENOKOT) 8.6 MG tablet Take 1 tablet (8.6 mg total) by mouth daily. 30 tablet 0   Spacer/Aero-Holding Chambers (AEROCHAMBER MV) inhaler Use as instructed 1 each 1   No current facility-administered medications on file prior to visit.    Allergies  Allergen Reactions   Zonegran [Zonisamide] Rash     Review of Systems Constitutional: negative for chills, fatigue, fevers and sweats Eyes: negative for irritation, redness and visual disturbance Ears, nose, mouth, throat, and face: negative for hearing loss, nasal congestion, snoring and tinnitus Respiratory: negative for asthma, cough, sputum Cardiovascular: negative for chest pain, dyspnea, exertional chest pressure/discomfort, irregular heart beat, palpitations and syncope Gastrointestinal: negative for abdominal pain, change in  bowel habits, nausea and vomiting Genitourinary: negative for abnormal menstrual periods, genital lesions, sexual problems and vaginal discharge, dysuria and urinary incontinence Integument/breast: negative for breast lump, breast tenderness and nipple discharge Hematologic/lymphatic: negative for bleeding and easy bruising Musculoskeletal:negative for back pain and muscle weakness Neurological: negative for dizziness, headaches, vertigo and weakness Endocrine: negative for diabetic symptoms including polydipsia, polyuria and skin dryness Allergic/Immunologic: negative for hay fever and urticaria        Objective:  Blood pressure 121/80, pulse 89, resp. rate 16, height '5\' 3"'$  (1.6 m), weight 197 lb (89.4 kg). Body mass index is 34.9 kg/m.  General Appearance:    Alert, cooperative, no distress, appears stated age, mild obese  Head:    Normocephalic, without obvious abnormality, atraumatic  Eyes:    PERRL, conjunctiva/corneas clear, EOM's intact, both eyes  Ears:    Normal external ear canals, both ears  Nose:   Nares normal, septum midline, mucosa normal, no drainage or sinus tenderness  Throat:   Lips, mucosa, and tongue normal; teeth and gums normal  Neck:   Supple, symmetrical, trachea midline, no adenopathy; thyroid: no enlargement/tenderness/nodules; no carotid bruit or JVD  Back:     Symmetric, no curvature, ROM normal, no CVA tenderness  Lungs:     Clear to auscultation bilaterally, respirations unlabored  Chest Wall:    No tenderness or deformity   Heart:    Regular rate and rhythm, S1 and S2 normal, no murmur, rub or gallop  Breast Exam:    No tenderness, masses, or nipple abnormality.   Abdomen:     Soft, non-tender, bowel sounds active all four quadrants, no masses, no organomegaly.    Genitalia:    Pelvic:external genitalia normal, vagina without lesions, discharge, or tenderness, rectovaginal septum normal. Cervix difficult to visualize due to mild narrowing of vaginal canal,  patient with difficulty tolerating speculum when opening. Attempted to use pediatric speculum but difficult to visualize speculum.  No adnexal masses or tenderness.  Uterus normal size, shape, mobile, regular contours, nontender.  Rectal:  Normal external sphincter.  No hemorrhoids appreciated. Internal exam not done.   Extremities:   Extremities normal, atraumatic, no cyanosis or edema  Pulses:   2+ and symmetric all extremities  Skin:   Skin color, texture, turgor normal, no rashes or lesions  Lymph nodes:   Cervical, supraclavicular, and axillary nodes normal  Neurologic:  Grossly intact.  Partial paralysis of left side (face, upper and lower limb)   .  Labs:  Lab Results  Component Value Date   WBC 5.6 10/17/2022   HGB 13.0 10/17/2022   HCT 39.0 10/17/2022   MCV 92.4 10/17/2022   PLT 293.0 10/17/2022    Lab Results  Component Value Date   CREATININE 0.62 10/17/2022   BUN 14 10/17/2022   NA 139 10/17/2022   K 3.9 10/17/2022   CL 106 10/17/2022   CO2 26 10/17/2022    Lab Results  Component Value Date   ALT 25 10/17/2022   AST 20 10/17/2022   ALKPHOS 94 10/17/2022   BILITOT 0.3 10/17/2022    Lab Results  Component Value Date   TSH 1.770 11/11/2020    Lab Results  Component Value Date   HGBA1C 5.3 11/17/2021   Lab Results  Component Value Date   CHOL 181 05/15/2019   HDL 57.70 05/15/2019   LDLCALC 103 (H) 05/15/2019   LDLDIRECT 109.0 04/17/2016   TRIG 102.0 05/15/2019   CHOLHDL 3 05/15/2019     Assessment:   Healthy female exam. Moderate obesity  Cervical cancer screening HTN Depo Provera contraception   Plan:    - Labs: performed today.  - Breast self exam technique reviewed and patient encouraged to perform self-exam monthly. - Contraception: Depo-Provera injections. Had normal Dexa Scan in 2021 for history of prolonged use. Recommend Calcium/Vitamin D supplementation.  - Discussed healthy lifestyle modifications. - Mammogram: ordered - Pap  smear performed today (blind pap), difficult due to narrow vaginal canal, some intolerance to exam even with Pediatric speculum.  - HTN  management as recommended by PCP.  - Up to date on flu vaccine.  - COVID vaccination status:  Can receive booster when eligible.  - Follow up in 1 year for annual exam.    Rubie Maid, MD [facility] at [facility]

## 2023-01-03 LAB — LIPID PANEL
Chol/HDL Ratio: 3.8 ratio (ref 0.0–4.4)
Cholesterol, Total: 196 mg/dL (ref 100–199)
HDL: 51 mg/dL (ref >39)
LDL Chol Calc (NIH): 106 mg/dL — ABNORMAL HIGH (ref 0–99)
Triglycerides: 228 mg/dL — ABNORMAL HIGH (ref 0–149)
VLDL Cholesterol Cal: 39 mg/dL (ref 5–40)

## 2023-01-03 LAB — COMPREHENSIVE METABOLIC PANEL
ALT: 21 IU/L (ref 0–32)
AST: 21 IU/L (ref 0–40)
Albumin/Globulin Ratio: 2.1 (ref 1.2–2.2)
Albumin: 4.8 g/dL (ref 3.9–4.9)
Alkaline Phosphatase: 105 IU/L (ref 44–121)
BUN/Creatinine Ratio: 21 (ref 9–23)
BUN: 15 mg/dL (ref 6–24)
Bilirubin Total: <0.2 mg/dL (ref 0.0–1.2)
CO2: 21 mmol/L (ref 20–29)
Calcium: 9.6 mg/dL (ref 8.7–10.2)
Chloride: 103 mmol/L (ref 96–106)
Creatinine, Ser: 0.73 mg/dL (ref 0.57–1.00)
Globulin, Total: 2.3 g/dL (ref 1.5–4.5)
Glucose: 92 mg/dL (ref 70–99)
Potassium: 4.2 mmol/L (ref 3.5–5.2)
Sodium: 142 mmol/L (ref 134–144)
Total Protein: 7.1 g/dL (ref 6.0–8.5)
eGFR: 104 mL/min/{1.73_m2} (ref >59)

## 2023-01-03 LAB — CBC
Hematocrit: 42.3 % (ref 34.0–46.6)
Hemoglobin: 13.8 g/dL (ref 11.1–15.9)
MCH: 29.5 pg (ref 26.6–33.0)
MCHC: 32.6 g/dL (ref 31.5–35.7)
MCV: 90 fL (ref 79–97)
Platelets: 327 10*3/uL (ref 150–450)
RBC: 4.68 x10E6/uL (ref 3.77–5.28)
RDW: 12.4 % (ref 11.7–15.4)
WBC: 5.7 10*3/uL (ref 3.4–10.8)

## 2023-01-03 LAB — TSH: TSH: 2.01 u[IU]/mL (ref 0.450–4.500)

## 2023-01-03 LAB — HEMOGLOBIN A1C
Est. average glucose Bld gHb Est-mCnc: 103 mg/dL
Hgb A1c MFr Bld: 5.2 % (ref 4.8–5.6)

## 2023-01-03 LAB — VITAMIN D 25 HYDROXY (VIT D DEFICIENCY, FRACTURES): Vit D, 25-Hydroxy: 45.1 ng/mL (ref 30.0–100.0)

## 2023-01-09 DIAGNOSIS — M791 Myalgia, unspecified site: Secondary | ICD-10-CM | POA: Diagnosis not present

## 2023-01-09 DIAGNOSIS — G43719 Chronic migraine without aura, intractable, without status migrainosus: Secondary | ICD-10-CM | POA: Diagnosis not present

## 2023-01-09 DIAGNOSIS — M542 Cervicalgia: Secondary | ICD-10-CM | POA: Diagnosis not present

## 2023-01-09 DIAGNOSIS — G518 Other disorders of facial nerve: Secondary | ICD-10-CM | POA: Diagnosis not present

## 2023-01-09 LAB — CYTOLOGY - PAP
Adequacy: ABSENT
Comment: NEGATIVE
Diagnosis: NEGATIVE
High risk HPV: NEGATIVE

## 2023-01-22 ENCOUNTER — Other Ambulatory Visit: Payer: Self-pay | Admitting: Internal Medicine

## 2023-01-23 ENCOUNTER — Ambulatory Visit (INDEPENDENT_AMBULATORY_CARE_PROVIDER_SITE_OTHER): Payer: 59 | Admitting: Internal Medicine

## 2023-01-23 ENCOUNTER — Ambulatory Visit
Admission: RE | Admit: 2023-01-23 | Discharge: 2023-01-23 | Disposition: A | Discharge status: Home / Self Care | Payer: 59 | Source: Ambulatory Visit | Attending: Obstetrics and Gynecology | Admitting: Obstetrics and Gynecology

## 2023-01-23 ENCOUNTER — Encounter: Payer: Self-pay | Admitting: Internal Medicine

## 2023-01-23 VITALS — BP 130/94 | HR 114 | Temp 98.2°F | Ht 63.0 in | Wt 199.6 lb

## 2023-01-23 DIAGNOSIS — K1379 Other lesions of oral mucosa: Secondary | ICD-10-CM | POA: Insufficient documentation

## 2023-01-23 DIAGNOSIS — Z1231 Encounter for screening mammogram for malignant neoplasm of breast: Secondary | ICD-10-CM | POA: Diagnosis not present

## 2023-01-23 DIAGNOSIS — Z01419 Encounter for gynecological examination (general) (routine) without abnormal findings: Secondary | ICD-10-CM | POA: Insufficient documentation

## 2023-01-23 DIAGNOSIS — K219 Gastro-esophageal reflux disease without esophagitis: Secondary | ICD-10-CM

## 2023-01-23 DIAGNOSIS — G44321 Chronic post-traumatic headache, intractable: Secondary | ICD-10-CM | POA: Diagnosis not present

## 2023-01-23 MED ORDER — OMEPRAZOLE 20 MG PO CPDR: 20.0000 mg | DELAYED_RELEASE_CAPSULE | Freq: Two times a day (BID) | ORAL | 3 refills | Status: DC

## 2023-01-23 MED ORDER — ESCITALOPRAM OXALATE 10 MG PO TABS: 10.0000 mg | ORAL_TABLET | Freq: Every day | ORAL | 1 refills | Status: DC

## 2023-01-23 MED ORDER — METOPROLOL SUCCINATE ER 100 MG PO TB24: ORAL_TABLET | ORAL | 1 refills | Status: DC

## 2023-01-23 MED ORDER — LIDOCAINE VISCOUS HCL 2 % MT SOLN: 15.0000 mL | OROMUCOSAL | 0 refills | Status: DC | PRN

## 2023-01-23 NOTE — Assessment & Plan Note (Signed)
She has no lesions /ulcerations on upper palate,  but is exquistely tender to palpation.  Advised to see dentist next week..  viscous lidocaine prescrived.

## 2023-01-23 NOTE — Patient Instructions (Addendum)
Your blood pressure is high today and your pulse is > 100  Confirm that you are taking 100 mg toprol  so I can adjust your dose   Check your BP and pulse At home daily  for the  next week and send me the readings  Your headaches are likely worse because of medication rebound,  but I think you need to have another MRI brain since it has been 10 years  I have ordered this as well as a neurology referral for a second opinion

## 2023-01-23 NOTE — Assessment & Plan Note (Signed)
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 She has not had an MRI in ten years and is requesting a second opinion from neurology. Referral made,  MRI brain ordered.  May benefit from repeat trial of Elavil vs second ose of toprol

## 2023-01-23 NOTE — Progress Notes (Signed)
Subjective:  Patient ID: Margaret Zuniga, female    DOB: October 23, 1981  Age: 42 y.o. MRN: [id number]  CC: The primary encounter diagnosis was Intractable chronic post-traumatic headache. Diagnoses of Gastroesophageal reflux disease without esophagitis and Acute pain of mouth were also pertinent to this visit.   HPI Margaret Zuniga presents for  Chief Complaint  Patient presents with   discuss lab results   Last see Sept 2022.  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 feeling . Did not burn her mouth on anything hot,  has not eaten anything spicy. Hurts to touch it with anything including her tongue   Labs reviewed today were normal except of triglycerides  of 228 . She was not fasting.   1) HTN:    taking amlodipine 10 mg and metoprolol 100 mg daily.  Last several chart readings are normal however today she is tachycardic and her diastolic reading is elevated at 96 . Does not check at home. She reports that her pain is constant  from her headache syndrome and is taking Excedrin but no NSAIDS  2) Chronic headaches: lately have been severe for the last year .  The headaches are constant; she wakes up  with them , goes to bed with them . They localize to  the top of the head. No vision changes.   She has been seeing Dr Margaret Zuniga  at the [facility] in [place] for years.   Receiving trigger point injections every 6 weeks,  Qlipta ,  carbamazepine. And flexeril  She has been taking excedrin Tension headache 2 pills  once or twice daily for   the last several years.   She has not enclosed the use of excedrin to Dr Margaret Zuniga.  The severity of her  headaches have been  "15/10 " at times.  She has not discussed her increased pain  or her use of Excedrin with Dr Margaret Zuniga.  Her  Last MRI was 10 years ago .   She has had a trial of amitriptyline many years ago.     Outpatient Medications Prior to Visit  Medication Sig Dispense Refill   Acetaminophen-Caffeine 500-65 MG TABS Take 1 tablet by mouth in the morning, at noon, and at bedtime.      albuterol (VENTOLIN HFA) 108 (90 Base) MCG/ACT inhaler Inhale 2 puffs into the lungs every 6 (six) hours as needed for wheezing or shortness of breath.     amLODipine (NORVASC) 10 MG tablet TAKE 1 TABLET BY MOUTH EVERY DAY 90 tablet 1   Ascorbic Acid (VITAMIN C) 500 MG CAPS Take 1 tablet by mouth.     Atogepant (QULIPTA) 60 MG TABS      baclofen (LIORESAL) 10 MG tablet Take 10 mg by mouth 2 (two) times daily as needed.     carbamazepine (CARBATROL) 300 MG 12 hr capsule Take 300 mg by mouth in the morning, at noon, and at bedtime.   1   Cholecalciferol (VITAMIN D3) 25 MCG (1000 UT) CAPS Take 1 capsule by mouth.     diphenhydrAMINE HCl, Sleep, (ZZZQUIL PO) Take by mouth at bedtime.  fluticasone (FLONASE) 50 MCG/ACT nasal spray Place 2 sprays into both nostrils daily. 16 g 6   MAGNESIUM PO Take 500 mg by mouth.     medroxyPROGESTERone (DEPO-PROVERA) 150 MG/ML injection INJECT 1 ML (150 MG TOTAL) INTO THE MUSCLE EVERY 3 (THREE) MONTHS. 1 mL 3   Multiple Vitamins-Minerals (CENTRUM WOMEN PO) Take by mouth daily in the afternoon.     polyethylene glycol powder (GLYCOLAX/MIRALAX) 17 GM/SCOOP powder Take 17 g by mouth 2 (two) times daily as needed. 3350 g 1   senna (SENOKOT) 8.6 MG tablet Take 1 tablet (8.6 mg total) by mouth daily. 30 tablet 0   Spacer/Aero-Holding Chambers (AEROCHAMBER MV) inhaler Use as instructed 1 each 1   escitalopram (LEXAPRO) 10 MG tablet TAKE 1 TABLET BY MOUTH EVERY DAY 90 tablet 1   metoprolol succinate (TOPROL-XL) 100 MG 24 hr tablet TAKE 1 TABLET BY MOUTH EVERY DAY WITH OR IMMEDIATELY FOLLOWING A MEAL 90  tablet 1   omeprazole (PRILOSEC) 20 MG capsule Take 1 capsule (20 mg total) by mouth 2 (two) times daily before a meal. 180 capsule 3   fluticasone furoate-vilanterol (BREO ELLIPTA) 100-25 MCG/INH AEPB Inhale into the lungs. (Patient not taking: Reported on 01/23/2023)     No facility-administered medications prior to visit.    Review of Systems;  Patient denies headache, fevers, malaise, unintentional weight loss, skin rash, eye pain, sinus congestion and sinus pain, sore throat, dysphagia,  hemoptysis , cough, dyspnea, wheezing, chest pain, palpitations, orthopnea, edema, abdominal pain, nausea, melena, diarrhea, constipation, flank pain, dysuria, hematuria, urinary  Frequency, nocturia, numbness, tingling, seizures,  Focal weakness, Loss of consciousness,  Tremor, insomnia, depression, anxiety, and suicidal ideation.      Objective:  BP (!) 130/94   Pulse (!) 114   Temp 98.2 F (36.8 C) (Oral)   Ht 5' 3"$  (1.6 m)   Wt 199 lb 9.6 oz (90.5 kg)   SpO2 96%   BMI 35.36 kg/m   BP Readings from Last 3 Encounters:  01/23/23 (!) 130/94  01/02/23 121/80  11/17/22 118/78    Wt Readings from Last 3 Encounters:  01/23/23 199 lb 9.6 oz (90.5 kg)  01/02/23 197 lb (89.4 kg)  11/17/22 201 lb 12.8 oz (91.5 kg)    Physical Exam Vitals reviewed.  Constitutional:      General: She is not in acute distress.    Appearance: Normal appearance. She is obese. She is not ill-appearing, toxic-appearing or diaphoretic.  Eyes:     General: No scleral icterus.       Right eye: No discharge.        Left eye: No discharge.     Conjunctiva/sclera: Conjunctivae normal.  Cardiovascular:     Rate and Rhythm: Normal rate and regular rhythm.     Heart sounds: Normal heart sounds.  Pulmonary:     Effort: Pulmonary effort is normal. No respiratory distress.     Breath sounds: Normal breath sounds.  Musculoskeletal:        General: Normal range of motion.  Skin:    General: Skin is warm and dry.   Neurological:     General: No focal deficit present.     Mental Status: She is alert and oriented to person, place, and time. Mental status is at baseline.     Cranial Nerves: Cranial nerve deficit and facial asymmetry present.     Motor: Weakness present.     Coordination: Impaired rapid alternating movements.     Gait: Gait abnormal.  Psychiatric:        Mood and Affect: Mood normal.        Behavior: Behavior normal.        Thought Content: Thought content normal.        Judgment: Judgment normal.     Lab Results  Component Value Date   HGBA1C 5.2 01/02/2023   HGBA1C 5.3 11/17/2021   HGBA1C 5.1 11/11/2020    Lab Results  Component Value Date   CREATININE 0.73 01/02/2023   CREATININE 0.62 10/17/2022   CREATININE 0.80 11/17/2021    Lab Results  Component Value Date   WBC 5.7 01/02/2023   HGB 13.8 01/02/2023   HCT 42.3 01/02/2023   PLT 327 01/02/2023   GLUCOSE 92 01/02/2023   CHOL 196 01/02/2023   TRIG 228 (H) 01/02/2023   HDL 51 01/02/2023   LDLDIRECT 109.0 04/17/2016   LDLCALC 106 (H) 01/02/2023   ALT 21 01/02/2023   AST 21 01/02/2023   NA 142 01/02/2023   K 4.2 01/02/2023   CL 103 01/02/2023   CREATININE 0.73 01/02/2023   BUN 15 01/02/2023   CO2 21 01/02/2023   TSH 2.010 01/02/2023   HGBA1C 5.2 01/02/2023   MICROALBUR <0.7 06/22/2021    No results found.  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 She has not had an MRI in ten years and is requesting a second opinion from neurology. Referral made,  MRI brain ordered.  May benefit from repeat trial of Elavil vs second ose of toprol    Orders: -     [place]; Future -     Ambulatory referral to Neurology  Gastroesophageal reflux disease without  esophagitis -     Omeprazole; Take 1 capsule (20 mg total) by mouth 2 (two) times daily before a meal.  Dispense: 180 capsule; Refill: 3  Acute pain of mouth Assessment & Plan: She has no lesions /ulcerations on upper palate,  but is exquistely tender to palpation.  Advised to see dentist next week..  viscous lidocaine prescrived.    Other orders -     Escitalopram Oxalate; Take 1 tablet (10 mg total) by mouth daily.  Dispense: 90 tablet; Refill: 1 -     Metoprolol Succinate ER; TAKE 1 TABLET BY MOUTH EVERY DAY WITH OR IMMEDIATELY FOLLOWING A MEAL  Dispense: 90 tablet; Refill: 1 -     Lidocaine Viscous HCl; Use as directed 15 mLs in the mouth or throat every 4 (four) hours as needed for mouth pain.  Dispense: 100 mL; Refill: 0     I provided 30 minutes of face-to-face time during this encounter reviewing patient's last visit with me, patient's  most recent visit with  neurology gynecology and   recent surgical and non surgical procedures, previous  labs and imaging studies, counseling on currently addressed issues,  and post visit ordering to diagnostics and therapeutics .   Follow-up: No follow-ups on file.   Crecencio Mc, MD

## 2023-01-24 ENCOUNTER — Encounter: Payer: Self-pay | Admitting: Neurology

## 2023-01-24 ENCOUNTER — Telehealth: Payer: Self-pay | Admitting: Internal Medicine

## 2023-01-24 NOTE — Telephone Encounter (Signed)
Lft pt vm to call ofc . thanks 

## 2023-01-30 ENCOUNTER — Encounter: Payer: Self-pay | Admitting: Internal Medicine

## 2023-01-30 ENCOUNTER — Ambulatory Visit
Admission: RE | Admit: 2023-01-30 | Discharge: 2023-01-30 | Disposition: A | Discharge status: Home / Self Care | Payer: 59 | Source: Ambulatory Visit | Attending: Internal Medicine | Admitting: Internal Medicine

## 2023-01-30 DIAGNOSIS — G319 Degenerative disease of nervous system, unspecified: Secondary | ICD-10-CM | POA: Diagnosis not present

## 2023-01-30 DIAGNOSIS — G44321 Chronic post-traumatic headache, intractable: Secondary | ICD-10-CM | POA: Diagnosis not present

## 2023-01-30 DIAGNOSIS — R519 Headache, unspecified: Secondary | ICD-10-CM | POA: Diagnosis not present

## 2023-01-30 MED ORDER — GADOBUTROL 1 MMOL/ML IV SOLN
10.0000 mL | Freq: Once | INTRAVENOUS | Status: AC | PRN
  Administered 2023-01-30: 10 mL via INTRAVENOUS

## 2023-02-01 ENCOUNTER — Other Ambulatory Visit: Payer: Self-pay | Admitting: Internal Medicine

## 2023-02-01 ENCOUNTER — Encounter: Payer: Self-pay | Admitting: Internal Medicine

## 2023-02-01 DIAGNOSIS — I10 Essential (primary) hypertension: Secondary | ICD-10-CM

## 2023-02-01 MED ORDER — HYDROCHLOROTHIAZIDE 25 MG PO TABS: 25.0000 mg | ORAL_TABLET | Freq: Every day | ORAL | 3 refills | Status: DC

## 2023-02-01 NOTE — Assessment & Plan Note (Addendum)
BP improved but not at goal on metoprolol xl 100 mg and amlodipine 10 mg  daily . Adding hctz 25 mg daily

## 2023-02-02 ENCOUNTER — Other Ambulatory Visit: Payer: Self-pay

## 2023-02-02 DIAGNOSIS — J329 Chronic sinusitis, unspecified: Secondary | ICD-10-CM

## 2023-02-06 ENCOUNTER — Ambulatory Visit (INDEPENDENT_AMBULATORY_CARE_PROVIDER_SITE_OTHER): Payer: 59

## 2023-02-06 VITALS — BP 125/89 | Wt 194.0 lb

## 2023-02-06 DIAGNOSIS — Z3042 Encounter for surveillance of injectable contraceptive: Secondary | ICD-10-CM

## 2023-02-06 MED ORDER — MEDROXYPROGESTERONE ACETATE 150 MG/ML IM SUSP
150.0000 mg | Freq: Once | INTRAMUSCULAR | Status: AC
  Administered 2023-02-06: 150 mg via INTRAMUSCULAR

## 2023-02-06 NOTE — Progress Notes (Signed)
    NURSE VISIT NOTE  Subjective:    Patient ID: [name], female    DOB: [birth year], 42 y.o.   MRN: [id number]  HPI  Patient is a 42 y.o. G0P0000 female who presents for depo provera injection.   Objective:    BP 125/89   Wt 194 lb (88 kg)   BMI 34.37 kg/m   Last Annual: 01/02/2023. Last pap: 01/02/2023 Last Depo-Provera: 11/18/2023. Side Effects if any: none. Serum HCG indicated? No . Depo-Provera 150 mg IM given by: Cleophas Dunker, CMA. Site: Left [place]  Lab Review  @THIS$  VISIT ONLY@  Assessment:   No diagnosis found.   Plan:   Next appointment due between 04/24/2023 and 05/08/2023.    Cleophas Dunker, CMA

## 2023-02-18 ENCOUNTER — Encounter: Payer: Self-pay | Admitting: Internal Medicine

## 2023-02-18 DIAGNOSIS — I10 Essential (primary) hypertension: Secondary | ICD-10-CM

## 2023-02-20 DIAGNOSIS — M542 Cervicalgia: Secondary | ICD-10-CM | POA: Diagnosis not present

## 2023-02-20 DIAGNOSIS — G43719 Chronic migraine without aura, intractable, without status migrainosus: Secondary | ICD-10-CM | POA: Diagnosis not present

## 2023-02-20 DIAGNOSIS — M791 Myalgia, unspecified site: Secondary | ICD-10-CM | POA: Diagnosis not present

## 2023-02-21 DIAGNOSIS — J323 Chronic sphenoidal sinusitis: Secondary | ICD-10-CM | POA: Diagnosis not present

## 2023-02-21 DIAGNOSIS — J324 Chronic pansinusitis: Secondary | ICD-10-CM | POA: Diagnosis not present

## 2023-02-21 DIAGNOSIS — J302 Other seasonal allergic rhinitis: Secondary | ICD-10-CM | POA: Diagnosis not present

## 2023-03-14 ENCOUNTER — Ambulatory Visit
Admission: RE | Admit: 2023-03-14 | Discharge: 2023-03-14 | Disposition: A | Discharge status: Home / Self Care | Payer: Medicare HMO | Source: Ambulatory Visit | Attending: Physician Assistant | Admitting: Physician Assistant

## 2023-03-14 VITALS — BP 135/102 | HR 93 | Temp 99.8°F | Ht 63.0 in | Wt 200.0 lb

## 2023-03-14 DIAGNOSIS — R051 Acute cough: Secondary | ICD-10-CM | POA: Diagnosis not present

## 2023-03-14 DIAGNOSIS — J069 Acute upper respiratory infection, unspecified: Secondary | ICD-10-CM

## 2023-03-14 DIAGNOSIS — J029 Acute pharyngitis, unspecified: Secondary | ICD-10-CM

## 2023-03-14 LAB — GROUP A STREP BY PCR: Group A Strep by PCR: NOT DETECTED

## 2023-03-14 LAB — SARS CORONAVIRUS 2 BY RT PCR: SARS Coronavirus 2 by RT PCR: NEGATIVE

## 2023-03-14 MED ORDER — PROMETHAZINE-DM 6.25-15 MG/5ML PO SYRP: 5.0000 mL | ORAL_SOLUTION | Freq: Four times a day (QID) | ORAL | 0 refills | Status: DC | PRN

## 2023-03-14 MED ORDER — LIDOCAINE VISCOUS HCL 2 % MT SOLN: 15.0000 mL | OROMUCOSAL | 0 refills | Status: AC | PRN

## 2023-03-14 NOTE — Discharge Instructions (Signed)
-  Negative strep testing. -We are checking you for COVID and I will call if you are positive.  -Your symptoms are also consistent with a viral infection. You should feel better within a week or 2. -Increase rest and fluids.  -I sent a different cough medicine and viscous lidocaine to pharmacy for your symptoms.  -Return if fever, breathing trouble or any worsening of symptoms.
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
## 2023-03-14 NOTE — ED Triage Notes (Signed)
Pt c/o cough, sore throat, and ear pain x4days  Pt is around her father who had a Viral URI with cough.

## 2023-04-03 DIAGNOSIS — M791 Myalgia, unspecified site: Secondary | ICD-10-CM | POA: Diagnosis not present

## 2023-04-03 DIAGNOSIS — G43719 Chronic migraine without aura, intractable, without status migrainosus: Secondary | ICD-10-CM | POA: Diagnosis not present

## 2023-04-03 DIAGNOSIS — M542 Cervicalgia: Secondary | ICD-10-CM | POA: Diagnosis not present

## 2023-04-19 DIAGNOSIS — J301 Allergic rhinitis due to pollen: Secondary | ICD-10-CM | POA: Diagnosis not present

## 2023-04-19 DIAGNOSIS — J323 Chronic sphenoidal sinusitis: Secondary | ICD-10-CM | POA: Diagnosis not present

## 2023-04-19 DIAGNOSIS — G44041 Chronic paroxysmal hemicrania, intractable: Secondary | ICD-10-CM | POA: Diagnosis not present

## 2023-04-20 ENCOUNTER — Other Ambulatory Visit: Payer: Self-pay | Admitting: Otolaryngology

## 2023-04-20 DIAGNOSIS — J329 Chronic sinusitis, unspecified: Secondary | ICD-10-CM

## 2023-04-20 DIAGNOSIS — J301 Allergic rhinitis due to pollen: Secondary | ICD-10-CM | POA: Diagnosis not present

## 2023-05-01 ENCOUNTER — Ambulatory Visit (INDEPENDENT_AMBULATORY_CARE_PROVIDER_SITE_OTHER): Payer: Medicare HMO

## 2023-05-01 VITALS — BP 133/85 | HR 106 | Wt 200.5 lb

## 2023-05-01 DIAGNOSIS — Z3042 Encounter for surveillance of injectable contraceptive: Secondary | ICD-10-CM | POA: Diagnosis not present

## 2023-05-01 MED ORDER — MEDROXYPROGESTERONE ACETATE 150 MG/ML IM SUSY
150.0000 mg | PREFILLED_SYRINGE | Freq: Once | INTRAMUSCULAR | Status: AC
  Administered 2023-05-01: 150 mg via INTRAMUSCULAR

## 2023-05-01 NOTE — Progress Notes (Signed)
    NURSE VISIT NOTE  Subjective:    Patient ID: Margaret Zuniga, female    DOB: 01-17-1981, 42 y.o.   MRN: [id number]  HPI  Patient is a 42 y.o. G0P0000 female who presents for depo provera injection.   Objective:    Wt 200 lb 8 oz (90.9 kg)   BMI 35.52 kg/m   Last Annual: 01/02/2023. Last pap: 01/02/2023. Last Depo-Provera: 02/06/2023. Side Effects if any: none. Serum HCG indicated? No . Depo-Provera 150 mg IM given by: Doristine Devoid, CMA. Site: Right Upper [place]er [place]    Assessment:   1. Encounter for surveillance of injectable contraceptive      Plan:   Next appointment due between August 6th and August 20th.    Burtis Junes, CMA

## 2023-05-02 ENCOUNTER — Telehealth: Payer: Self-pay | Admitting: Internal Medicine

## 2023-05-02 DIAGNOSIS — J4540 Moderate persistent asthma, uncomplicated: Secondary | ICD-10-CM

## 2023-05-02 NOTE — Addendum Note (Signed)
Addended by: Sandy Salaam on: 05/02/2023 03:18 PM   Modules accepted: Orders

## 2023-05-02 NOTE — Telephone Encounter (Signed)
Patient called and needs a referral to [facility] for 05/30/2023.

## 2023-05-02 NOTE — Telephone Encounter (Signed)
I have pended referral for your approval.  

## 2023-05-02 NOTE — Addendum Note (Signed)
Addended by: Sherlene Shams on: 05/02/2023 05:42 PM   Modules accepted: Orders

## 2023-05-08 ENCOUNTER — Ambulatory Visit
Admission: RE | Admit: 2023-05-08 | Discharge: 2023-05-08 | Disposition: A | Discharge status: Home / Self Care | Payer: Medicare HMO | Source: Ambulatory Visit | Attending: Otolaryngology | Admitting: Otolaryngology

## 2023-05-08 DIAGNOSIS — J329 Chronic sinusitis, unspecified: Secondary | ICD-10-CM

## 2023-05-08 DIAGNOSIS — R519 Headache, unspecified: Secondary | ICD-10-CM | POA: Diagnosis not present

## 2023-05-15 DIAGNOSIS — J323 Chronic sphenoidal sinusitis: Secondary | ICD-10-CM | POA: Diagnosis not present

## 2023-05-18 DIAGNOSIS — J329 Chronic sinusitis, unspecified: Secondary | ICD-10-CM | POA: Diagnosis not present

## 2023-05-18 DIAGNOSIS — J323 Chronic sphenoidal sinusitis: Secondary | ICD-10-CM | POA: Diagnosis not present
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
## 2023-05-23 ENCOUNTER — Encounter: Payer: Self-pay | Admitting: Neurology

## 2023-05-23 ENCOUNTER — Ambulatory Visit (INDEPENDENT_AMBULATORY_CARE_PROVIDER_SITE_OTHER): Payer: Medicare HMO | Admitting: Neurology

## 2023-05-23 VITALS — BP 133/83 | HR 94 | Ht 63.0 in | Wt 202.6 lb

## 2023-05-23 DIAGNOSIS — R519 Headache, unspecified: Secondary | ICD-10-CM

## 2023-05-23 DIAGNOSIS — I671 Cerebral aneurysm, nonruptured: Secondary | ICD-10-CM

## 2023-05-23 DIAGNOSIS — G8929 Other chronic pain: Secondary | ICD-10-CM

## 2023-05-23 NOTE — Patient Instructions (Signed)
We will check CTA of head to follow up on the aneurysms Unfortunately, I do not think I can help you.  I will refer you to the headache specialist at [facility].

## 2023-05-25 ENCOUNTER — Encounter: Payer: Self-pay | Admitting: Neurology

## 2023-05-28 ENCOUNTER — Telehealth: Payer: Self-pay

## 2023-05-28 NOTE — Telephone Encounter (Signed)
LMTCB. Need to let pt know that we received a surgical clearance form [facility] however Dr. Darrick Huntsman needs her to schedule an appt for the surgical clearance before she can sign off on the form.

## 2023-05-29 NOTE — Telephone Encounter (Signed)
Noted  

## 2023-05-29 NOTE — Telephone Encounter (Signed)
Pt called back and I read the message to her and she is scheduled for 7/30

## 2023-05-30 ENCOUNTER — Ambulatory Visit (INDEPENDENT_AMBULATORY_CARE_PROVIDER_SITE_OTHER): Payer: Medicare HMO | Admitting: Student in an Organized Health Care Education/Training Program

## 2023-05-30 ENCOUNTER — Encounter: Payer: Self-pay | Admitting: Student in an Organized Health Care Education/Training Program

## 2023-05-30 VITALS — BP 120/76 | HR 97 | Temp 97.6°F | Ht 63.0 in | Wt 201.6 lb

## 2023-05-30 DIAGNOSIS — R0602 Shortness of breath: Secondary | ICD-10-CM | POA: Diagnosis not present

## 2023-05-30 MED ORDER — FLUTICASONE FUROATE-VILANTEROL 100-25 MCG/ACT IN AEPB: 1.0000 | INHALATION_SPRAY | Freq: Every day | RESPIRATORY_TRACT | 12 refills | Status: DC
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
## 2023-05-31 ENCOUNTER — Ambulatory Visit
Admission: RE | Admit: 2023-05-31 | Discharge: 2023-05-31 | Disposition: A | Discharge status: Home / Self Care | Payer: Medicare HMO | Source: Ambulatory Visit | Attending: Neurology | Admitting: Neurology

## 2023-05-31 DIAGNOSIS — Z8679 Personal history of other diseases of the circulatory system: Secondary | ICD-10-CM | POA: Diagnosis not present

## 2023-05-31 DIAGNOSIS — I671 Cerebral aneurysm, nonruptured: Secondary | ICD-10-CM

## 2023-05-31 DIAGNOSIS — R519 Headache, unspecified: Secondary | ICD-10-CM | POA: Diagnosis not present

## 2023-05-31 DIAGNOSIS — G8929 Other chronic pain: Secondary | ICD-10-CM

## 2023-05-31 MED ORDER — IOPAMIDOL (ISOVUE-370) INJECTION 76%
75.0000 mL | Freq: Once | INTRAVENOUS | Status: AC | PRN
  Administered 2023-05-31: 75 mL via INTRAVENOUS

## 2023-06-05 ENCOUNTER — Telehealth: Payer: Self-pay

## 2023-06-05 DIAGNOSIS — G8929 Other chronic pain: Secondary | ICD-10-CM

## 2023-06-05 NOTE — Telephone Encounter (Signed)
-----   Message from Drema Dallas, DO sent at 05/28/2023 10:49 AM EDT ----- Zenon Mayo I got a message from [facility] about this patient.  They recommend referring to Dr. Rudean Curt. Pocock at [facility]

## 2023-06-05 NOTE — Telephone Encounter (Signed)
-----   Message from Adam R Jaffe, DO sent at 05/28/2023 10:49 AM EDT ----- Margaret Zuniga I got a message from [facility] about this patient.  They recommend referring to Dr. Kristyn M. Pocock at [facility]  

## 2023-06-05 NOTE — Telephone Encounter (Signed)
Referral sent to [facility] Via Epic due to Printer Connection issue system wide.

## 2023-06-13 ENCOUNTER — Telehealth: Payer: Self-pay | Admitting: Student in an Organized Health Care Education/Training Program

## 2023-06-13 DIAGNOSIS — R0602 Shortness of breath: Secondary | ICD-10-CM

## 2023-06-13 MED ORDER — FLUTICASONE FUROATE-VILANTEROL 100-25 MCG/ACT IN AEPB
1.0000 | INHALATION_SPRAY | Freq: Every day | RESPIRATORY_TRACT | 12 refills | Status: DC
Start: 2023-06-13 — End: 2023-12-26

## 2023-06-13 NOTE — Telephone Encounter (Signed)
Breo Rx signed by Dr. Aundria Rud and faxed to [facility].    Lm for patient.

## 2023-06-13 NOTE — Telephone Encounter (Signed)
Patient's father dropped off list of alternatives. Alternatives include Breo, Advair, Wixela and Fluticasone propionate-Salmeterol    Spoke to Lincoln with [facility]. She stated that patient was given generic Breo due to pt having two difference insurance policies. Medicare will not allow brand name Rx to be filed via E script. Rx will need to be hand written and state DAW.  Dr. Aundria Rud, please advise. Thanks

## 2023-06-13 NOTE — Telephone Encounter (Signed)
Patient is aware of below message and voiced her understanding.  Nothing further needed.   

## 2023-06-13 NOTE — Telephone Encounter (Signed)
Pt received letter from [facility] that the Virgel Bouquet is not covered by her insurance. Needs another option

## 2023-06-13 NOTE — Telephone Encounter (Signed)
Spoke to patient. She stated that Virgel Bouquet is not covered by insurance.  She received a list of covered alternatives, however she has misplaced the list.  PA team, please advise on alternatives. Thanks

## 2023-07-06 ENCOUNTER — Ambulatory Visit
Admission: RE | Admit: 2023-07-06 | Discharge: 2023-07-06 | Disposition: A | Discharge status: Home / Self Care | Payer: Medicare HMO | Source: Ambulatory Visit | Attending: Family Medicine | Admitting: Family Medicine

## 2023-07-06 VITALS — BP 125/86 | HR 108 | Temp 98.3°F | Resp 16 | Ht 63.0 in | Wt 201.5 lb

## 2023-07-06 DIAGNOSIS — B9789 Other viral agents as the cause of diseases classified elsewhere: Secondary | ICD-10-CM

## 2023-07-06 DIAGNOSIS — U071 COVID-19: Secondary | ICD-10-CM | POA: Diagnosis not present

## 2023-07-06 DIAGNOSIS — J988 Other specified respiratory disorders: Secondary | ICD-10-CM

## 2023-07-06 LAB — SARS CORONAVIRUS 2 BY RT PCR: SARS Coronavirus 2 by RT PCR: POSITIVE — AB

## 2023-07-06 LAB — GROUP A STREP BY PCR: Group A Strep by PCR: NOT DETECTED

## 2023-07-06 NOTE — Discharge Instructions (Addendum)
Your strep test is negative. We will contact you if your COVID test is positive.  Please quarantine while you wait for the results.  If your test is negative you may resume normal activities.  If your test is positive, quarantine until you are without a fever for at least 24 hours without fever-lowering (Tylenol/Motrin) medications.    If your were prescribed medication, stop by the pharmacy to pick them up.   You can take Tylenol and/or Ibuprofen as needed for fever reduction and pain relief.    For cough: honey 1/2 to 1 teaspoon (you can dilute the honey in water or another fluid).  You can also use guaifenesin and dextromethorphan for cough. You can use a humidifier for chest congestion and cough.  If you don't have a humidifier, you can sit in the bathroom with the hot shower running.      For sore throat: try warm salt water gargles, Mucinex sore throat cough drops or cepacol lozenges, throat spray, warm tea or water with lemon/honey, popsicles or ice, or OTC cold relief medicine for throat discomfort. You can also purchase chloraseptic spray at the pharmacy or dollar store.   For congestion: take a daily anti-histamine like Zyrtec, Claritin, and a oral decongestant, such as pseudoephedrine.  You can also use Flonase 1-2 sprays in each nostril daily. Afrin is also a good option, if you do not have high blood pressure.    It is important to stay hydrated: drink plenty of fluids (water, gatorade/powerade/pedialyte, juices, or teas) to keep your throat moisturized and help further relieve irritation/discomfort.    Return or go to the Emergency Department if symptoms worsen or do not improve in the next few days   

## 2023-07-06 NOTE — ED Triage Notes (Signed)
Pt c/o sore throat, left ear pain, and body aches. Started about 3 days ago. Denies fever.
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
## 2023-07-08 ENCOUNTER — Telehealth: Payer: Self-pay | Admitting: Family Medicine

## 2023-07-08 NOTE — Telephone Encounter (Signed)
Asked to speak with patient about COVID. She tested positive on Friday after having 2 days of symptoms. Pt asking if we a re-sending anything in for her symptoms. Paxlovid is not recommended at this time at she has 5 days of symptoms. She is has a sore throat but has not been using the lidocaine solution previously prescribed.  She is using chloraseptic spray for throat pain and taking Motrin once a day.    Reviewed symptomatic treatment and ED precautions.  Pt has a follow up appt with her doctor on Tuesday. She is concerned she will give COVID to her mom when she comes to town next month.  Discussed COVID transmission with patient. All questions asked were answered.   Katha Cabal, DO

## 2023-07-10 ENCOUNTER — Other Ambulatory Visit (INDEPENDENT_AMBULATORY_CARE_PROVIDER_SITE_OTHER): Payer: Medicare HMO

## 2023-07-10 ENCOUNTER — Telehealth (INDEPENDENT_AMBULATORY_CARE_PROVIDER_SITE_OTHER): Payer: Medicare HMO | Admitting: Internal Medicine

## 2023-07-10 ENCOUNTER — Other Ambulatory Visit: Payer: Self-pay

## 2023-07-10 ENCOUNTER — Encounter: Payer: Self-pay | Admitting: Internal Medicine

## 2023-07-10 ENCOUNTER — Ambulatory Visit (INDEPENDENT_AMBULATORY_CARE_PROVIDER_SITE_OTHER): Payer: Medicare HMO

## 2023-07-10 VITALS — BP 123/78 | HR 87 | Ht 63.0 in | Wt 200.0 lb

## 2023-07-10 DIAGNOSIS — Z01818 Encounter for other preprocedural examination: Secondary | ICD-10-CM

## 2023-07-10 DIAGNOSIS — J323 Chronic sphenoidal sinusitis: Secondary | ICD-10-CM | POA: Diagnosis not present

## 2023-07-10 DIAGNOSIS — G8191 Hemiplegia, unspecified affecting right dominant side: Secondary | ICD-10-CM | POA: Diagnosis not present

## 2023-07-10 NOTE — Assessment & Plan Note (Addendum)
She has no known contraindication  to anticipated elective sinus surgery.   Screening labs,  chest x ray have been ordered and reviewed with no significant findings.  I have ordered and reviewed a 12 lead EKG and find that there are no acute changes and patient is in sinus rhythm.     She is considered low risk for sinus surgery
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
## 2023-07-10 NOTE — Addendum Note (Signed)
Addended by: Sandy Salaam on: 07/10/2023 02:15 PM   Modules accepted: Orders
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
## 2023-07-10 NOTE — Assessment & Plan Note (Signed)
Left side,  with surgery planned by [facility] August 15.   Preoperative clearance requested

## 2023-07-11 ENCOUNTER — Telehealth: Payer: Self-pay

## 2023-07-11 NOTE — Assessment & Plan Note (Addendum)
I have ordered and reviewed a 12 lead EKG and find that there are no acute changes and patient is in sinus rhythm.    Screening labs were negative for anemia,  renal disease and diabetes. Chest x ray is clear.    She is medically cleared for surgery and considered a low operative risk

## 2023-07-11 NOTE — Telephone Encounter (Signed)
Surgical clearance signed and fax to [facility].

## 2023-07-13 MED ORDER — POTASSIUM CHLORIDE CRYS ER 10 MEQ PO TBCR: 20.0000 meq | EXTENDED_RELEASE_TABLET | Freq: Every day | ORAL | 1 refills | Status: DC

## 2023-07-13 NOTE — Addendum Note (Signed)
Addended by: Sherlene Shams on: 07/13/2023 10:10 AM   Modules accepted: Orders

## 2023-07-24 ENCOUNTER — Ambulatory Visit (INDEPENDENT_AMBULATORY_CARE_PROVIDER_SITE_OTHER): Payer: Medicare HMO

## 2023-07-24 VITALS — BP 117/73 | HR 97 | Ht 63.0 in | Wt 197.7 lb

## 2023-07-24 DIAGNOSIS — Z3042 Encounter for surveillance of injectable contraceptive: Secondary | ICD-10-CM

## 2023-07-24 MED ORDER — MEDROXYPROGESTERONE ACETATE 150 MG/ML IM SUSP
150.0000 mg | Freq: Once | INTRAMUSCULAR | Status: AC
  Administered 2023-07-24: 150 mg via INTRAMUSCULAR

## 2023-07-24 NOTE — Progress Notes (Signed)
    NURSE VISIT NOTE  Subjective:    Patient ID: Margaret Zuniga, female    DOB: 1981-01-30, 42 y.o.   MRN: [id number]  HPI  Patient is a 42 y.o. G0P0000 female who presents for depo provera injection.   Objective:    BP 117/73   Pulse 97   Ht 5\' 3"  (1.6 m)   Wt 197 lb 11.2 oz (89.7 kg)   BMI 35.02 kg/m   Last Annual: 01/02/23. Last pap: 01/02/23. Last Depo-Provera: 05/01/23. Side Effects if any: Patient reports one day of heavy spotting 06/11/23. Serum HCG indicated? No . Depo-Provera 150 mg IM given by: Rocco Serene, LPN. Site: Left Upper Outer Quandrant  Lab Review    Assessment:   1. Encounter for surveillance of injectable contraceptive      Plan:   Next appointment due between 10/09/23 and 10/23/23.    Rocco Serene, LPN

## 2023-07-24 NOTE — Patient Instructions (Signed)

## 2023-07-25 DIAGNOSIS — G8929 Other chronic pain: Secondary | ICD-10-CM | POA: Diagnosis not present

## 2023-07-25 DIAGNOSIS — G4452 New daily persistent headache (NDPH): Secondary | ICD-10-CM | POA: Diagnosis not present

## 2023-07-25 DIAGNOSIS — R519 Headache, unspecified: Secondary | ICD-10-CM | POA: Diagnosis not present

## 2023-07-26 DIAGNOSIS — K219 Gastro-esophageal reflux disease without esophagitis: Secondary | ICD-10-CM | POA: Diagnosis not present

## 2023-07-26 DIAGNOSIS — J343 Hypertrophy of nasal turbinates: Secondary | ICD-10-CM | POA: Diagnosis not present

## 2023-07-26 DIAGNOSIS — J328 Other chronic sinusitis: Secondary | ICD-10-CM | POA: Diagnosis not present

## 2023-07-26 DIAGNOSIS — H532 Diplopia: Secondary | ICD-10-CM | POA: Diagnosis not present

## 2023-07-26 DIAGNOSIS — J329 Chronic sinusitis, unspecified: Secondary | ICD-10-CM | POA: Diagnosis not present

## 2023-07-26 DIAGNOSIS — J45909 Unspecified asthma, uncomplicated: Secondary | ICD-10-CM | POA: Diagnosis not present

## 2023-07-26 DIAGNOSIS — Z6834 Body mass index (BMI) 34.0-34.9, adult: Secondary | ICD-10-CM | POA: Diagnosis not present

## 2023-07-26 DIAGNOSIS — F32A Depression, unspecified: Secondary | ICD-10-CM | POA: Diagnosis not present

## 2023-07-26 DIAGNOSIS — I10 Essential (primary) hypertension: Secondary | ICD-10-CM | POA: Diagnosis not present

## 2023-07-26 HISTORY — PX: NASAL SINUS SURGERY: SHX719

## 2023-07-28 ENCOUNTER — Other Ambulatory Visit: Payer: Self-pay | Admitting: Internal Medicine

## 2023-08-01 DIAGNOSIS — J324 Chronic pansinusitis: Secondary | ICD-10-CM | POA: Diagnosis not present

## 2023-08-05 ENCOUNTER — Other Ambulatory Visit: Payer: Self-pay | Admitting: Internal Medicine

## 2023-08-06 NOTE — Telephone Encounter (Signed)
Pt is requesting a 90day supply. Is it okay to refill?

## 2023-08-15 DIAGNOSIS — J324 Chronic pansinusitis: Secondary | ICD-10-CM | POA: Diagnosis not present

## 2023-08-15 DIAGNOSIS — Z09 Encounter for follow-up examination after completed treatment for conditions other than malignant neoplasm: Secondary | ICD-10-CM | POA: Diagnosis not present

## 2023-09-12 DIAGNOSIS — J310 Chronic rhinitis: Secondary | ICD-10-CM | POA: Diagnosis not present

## 2023-10-02 DIAGNOSIS — G4452 New daily persistent headache (NDPH): Secondary | ICD-10-CM | POA: Diagnosis not present

## 2023-10-14 ENCOUNTER — Other Ambulatory Visit: Payer: Self-pay | Admitting: Internal Medicine

## 2023-10-16 ENCOUNTER — Ambulatory Visit: Payer: Medicare HMO

## 2023-10-16 VITALS — BP 113/80 | HR 87 | Ht 63.0 in | Wt 199.3 lb

## 2023-10-16 DIAGNOSIS — Z3042 Encounter for surveillance of injectable contraceptive: Secondary | ICD-10-CM | POA: Diagnosis not present

## 2023-10-16 MED ORDER — MEDROXYPROGESTERONE ACETATE 150 MG/ML IM SUSP
150.0000 mg | Freq: Once | INTRAMUSCULAR | Status: AC
  Administered 2023-10-16: 150 mg via INTRAMUSCULAR

## 2023-10-16 NOTE — Patient Instructions (Signed)

## 2023-10-16 NOTE — Progress Notes (Signed)
    NURSE VISIT NOTE  Subjective:    Patient ID: Margaret Zuniga, female    DOB: 1981-09-11, 42 y.o.   MRN: [id number]  HPI  Patient is a 42 y.o. G0P0000 female who presents for depo provera injection.   Objective:    There were no vitals taken for this visit.  Last Annual: 01/02/23. Last pap: 01/02/23. Last Depo-Provera: 07/24/23. Side Effects if any: none. Serum HCG indicated? No . Depo-Provera 150 mg IM given by: Rocco Serene, LPN. Site: Right Upper Outer Quandrant    Assessment:   1. Encounter for surveillance of injectable contraceptive      Plan:   Next appointment due between 01/01/24 and 01/15/24. Annual due after 01/03/24.    Rocco Serene, LPN

## 2023-10-30 DIAGNOSIS — H5010 Unspecified exotropia: Secondary | ICD-10-CM | POA: Diagnosis not present

## 2023-10-30 DIAGNOSIS — H53001 Unspecified amblyopia, right eye: Secondary | ICD-10-CM | POA: Diagnosis not present

## 2023-10-30 DIAGNOSIS — H2513 Age-related nuclear cataract, bilateral: Secondary | ICD-10-CM | POA: Diagnosis not present

## 2023-10-30 DIAGNOSIS — H52223 Regular astigmatism, bilateral: Secondary | ICD-10-CM | POA: Diagnosis not present

## 2023-10-31 DIAGNOSIS — G4452 New daily persistent headache (NDPH): Secondary | ICD-10-CM | POA: Diagnosis not present

## 2023-11-21 ENCOUNTER — Ambulatory Visit: Payer: Medicare HMO | Admitting: *Deleted

## 2023-11-21 VITALS — Ht 63.0 in | Wt 200.0 lb

## 2023-11-21 DIAGNOSIS — Z0000 Encounter for general adult medical examination without abnormal findings: Secondary | ICD-10-CM

## 2023-11-21 NOTE — Patient Instructions (Addendum)
Margaret Zuniga , Thank you for taking time to come for your Medicare Wellness Visit. I appreciate your ongoing commitment to your health goals. Please review the following plan we discussed and let me know if I can assist you in the future.   Referrals/Orders/Follow-Ups/Clinician Recommendations: None  This is a list of the screening recommended for you and due dates:  Health Maintenance  Topic Date Due   Mammogram  01/24/2024   Medicare Annual Wellness Visit  11/20/2024   DTaP/Tdap/Td vaccine (6 - Td or Tdap) 09/13/2025   Pap with HPV screening  01/03/2028   Flu Shot  Completed   COVID-19 Vaccine  Completed   Hepatitis C Screening  Completed   HIV Screening  Completed   HPV Vaccine  Aged Out    Advanced directives: (Copy Requested) Please bring a copy of your health care power of attorney and living will to the office to be added to your chart at your convenience.  Next Medicare Annual Wellness Visit scheduled for next year: Yes 11/24/24 @ 3:00
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
## 2023-11-22 ENCOUNTER — Telehealth: Payer: Self-pay | Admitting: Student in an Organized Health Care Education/Training Program

## 2023-11-22 NOTE — Telephone Encounter (Signed)
Pt needs to be sch for her PFT at [facility]

## 2023-11-23 NOTE — Telephone Encounter (Signed)
Patient's appt was scheduled by [facility] office on 10/19/23 and I didn't know until today PFT needed to be scheduled. There are no PFT appts available before 11/26/23 appt

## 2023-11-26 ENCOUNTER — Ambulatory Visit (INDEPENDENT_AMBULATORY_CARE_PROVIDER_SITE_OTHER): Payer: Medicare HMO | Admitting: Student in an Organized Health Care Education/Training Program

## 2023-11-26 DIAGNOSIS — R0602 Shortness of breath: Secondary | ICD-10-CM

## 2023-11-26 NOTE — Telephone Encounter (Signed)
Patient PFT has been scheduled for 12/17. She has a follow up appt in [place].  Nothing further needed.

## 2023-11-27 ENCOUNTER — Ambulatory Visit: Payer: Medicare HMO | Attending: Student in an Organized Health Care Education/Training Program

## 2023-11-27 DIAGNOSIS — R0602 Shortness of breath: Secondary | ICD-10-CM | POA: Diagnosis not present

## 2023-11-27 LAB — PULMONARY FUNCTION TEST ARMC ONLY
DL/VA % pred: 131 %
DL/VA: 5.79 ml/min/mmHg/L
DLCO unc % pred: 63 %
DLCO unc: 13.26 ml/min/mmHg
FEF 25-75 Post: 1.56 L/s
FEF 25-75 Pre: 1.12 L/s
FEF2575-%Change-Post: 38 %
FEF2575-%Pred-Post: 53 %
FEF2575-%Pred-Pre: 38 %
FEV1-%Change-Post: 29 %
FEV1-%Pred-Post: 54 %
FEV1-%Pred-Pre: 41 %
FEV1-Post: 1.54 L
FEV1-Pre: 1.19 L
FEV1FVC-%Change-Post: 0 %
FEV1FVC-%Pred-Pre: 95 %
FEV6-%Change-Post: 35 %
FEV6-%Pred-Post: 57 %
FEV6-%Pred-Pre: 42 %
FEV6-Post: 1.98 L
FEV6-Pre: 1.47 L
FEV6FVC-%Pred-Post: 102 %
FEV6FVC-%Pred-Pre: 102 %
FVC-%Change-Post: 29 %
FVC-%Pred-Post: 56 %
FVC-%Pred-Pre: 43 %
FVC-Post: 1.98 L
FVC-Pre: 1.53 L
Post FEV1/FVC ratio: 78 %
Post FEV6/FVC ratio: 100 %
Pre FEV1/FVC ratio: 78 %
Pre FEV6/FVC Ratio: 100 %
RV % pred: 58 %
RV: 0.94 L
TLC % pred: 65 %
TLC: 3.24 L

## 2023-11-27 MED ORDER — ALBUTEROL SULFATE (2.5 MG/3ML) 0.083% IN NEBU
2.5000 mg | INHALATION_SOLUTION | Freq: Once | RESPIRATORY_TRACT | Status: AC
  Administered 2023-11-27: 2.5 mg via RESPIRATORY_TRACT
  Filled 2023-11-27: qty 3

## 2023-11-28 ENCOUNTER — Ambulatory Visit
Admission: RE | Admit: 2023-11-28 | Discharge: 2023-11-28 | Disposition: A | Discharge status: Home / Self Care | Payer: Medicare HMO | Source: Ambulatory Visit | Attending: Family Medicine | Admitting: Family Medicine

## 2023-11-28 ENCOUNTER — Ambulatory Visit (INDEPENDENT_AMBULATORY_CARE_PROVIDER_SITE_OTHER): Payer: Medicare HMO | Admitting: Family Medicine

## 2023-11-28 ENCOUNTER — Encounter: Payer: Self-pay | Admitting: Family Medicine

## 2023-11-28 ENCOUNTER — Ambulatory Visit
Admission: RE | Admit: 2023-11-28 | Discharge: 2023-11-28 | Disposition: A | Discharge status: Home / Self Care | Payer: Medicare HMO | Attending: Family Medicine | Admitting: Family Medicine

## 2023-11-28 VITALS — BP 118/74 | HR 87 | Temp 98.1°F | Ht 63.0 in | Wt 200.2 lb

## 2023-11-28 DIAGNOSIS — M25551 Pain in right hip: Secondary | ICD-10-CM | POA: Diagnosis not present

## 2023-11-28 LAB — POCT URINE PREGNANCY: Preg Test, Ur: NEGATIVE
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
## 2023-11-28 NOTE — Assessment & Plan Note (Signed)
Possibly related to osteoarthritis.  Will get an x-ray today.  Discussed use of over-the-counter Tylenol for her discomfort.  Advised minimizing use of NSAIDs over-the-counter.  Discussed potentially doing physical therapy if her x-ray does not give a cause for her symptoms.  Discussed seeing orthopedics if she has significant arthritis in her hip.

## 2023-11-29 ENCOUNTER — Encounter: Payer: Self-pay | Admitting: Internal Medicine

## 2023-11-30 ENCOUNTER — Encounter: Payer: Self-pay | Admitting: Family Medicine

## 2023-11-30 DIAGNOSIS — M7989 Other specified soft tissue disorders: Secondary | ICD-10-CM

## 2023-12-02 NOTE — Progress Notes (Signed)
Patient rescheduled this appointment.  Raechel Chute, MD [facility]

## 2023-12-03 ENCOUNTER — Ambulatory Visit
Admission: RE | Admit: 2023-12-03 | Discharge: 2023-12-03 | Disposition: A | Discharge status: Home / Self Care | Payer: Medicare HMO | Source: Ambulatory Visit | Attending: Family Medicine | Admitting: Family Medicine

## 2023-12-03 ENCOUNTER — Ambulatory Visit: Payer: Medicare HMO | Admitting: Family

## 2023-12-03 ENCOUNTER — Telehealth: Payer: Self-pay

## 2023-12-03 DIAGNOSIS — M7121 Synovial cyst of popliteal space [Baker], right knee: Secondary | ICD-10-CM | POA: Diagnosis not present

## 2023-12-03 DIAGNOSIS — M79661 Pain in right lower leg: Secondary | ICD-10-CM | POA: Diagnosis not present

## 2023-12-03 DIAGNOSIS — M7989 Other specified soft tissue disorders: Secondary | ICD-10-CM | POA: Diagnosis not present

## 2023-12-03 DIAGNOSIS — M79604 Pain in right leg: Secondary | ICD-10-CM | POA: Diagnosis not present

## 2023-12-03 NOTE — Telephone Encounter (Signed)
Spoke with pt and she stated that she would like to go ahead with the [place] to check for a DVT. I have canceled her appt this afternoon with Padonda.

## 2023-12-03 NOTE — Telephone Encounter (Signed)
LMTCB

## 2023-12-03 NOTE — Addendum Note (Signed)
Addended by: Birdie Sons, Stephenia Vogan G on: 12/03/2023 02:06 PM   Modules accepted: Orders

## 2023-12-03 NOTE — Telephone Encounter (Signed)
Copied from CRM [id number]. Topic: General - Other >> Dec 03, 2023  8:30 AM Larwance Sachs wrote: Reason for CRM: Patient returned call to [facility], patient was unable to reach her. Please call back at [phone]

## 2023-12-03 NOTE — Telephone Encounter (Signed)
Patient states she has a visit with Worthy Rancher today at 2:00.

## 2023-12-03 NOTE — Telephone Encounter (Signed)
With the swelling it is possible she could have a blood clot. I would suggest we get an [place] to rule that out. I can order this once you speak with her. Thanks.

## 2023-12-03 NOTE — Telephone Encounter (Signed)
Noted. I have ordered this stat. Please let the stat referral pool know so they can get this scheduled.

## 2023-12-03 NOTE — Telephone Encounter (Signed)
See my chart encounter.

## 2023-12-03 NOTE — Telephone Encounter (Signed)
Spoke with pt and she stated that she is still having the swelling in her leg but it has gone done some. She stated that the redness and warmth is gone.

## 2023-12-04 ENCOUNTER — Other Ambulatory Visit: Payer: Self-pay | Admitting: Family Medicine

## 2023-12-04 DIAGNOSIS — M7121 Synovial cyst of popliteal space [Baker], right knee: Secondary | ICD-10-CM

## 2023-12-04 DIAGNOSIS — M25551 Pain in right hip: Secondary | ICD-10-CM

## 2023-12-07 DIAGNOSIS — M7061 Trochanteric bursitis, right hip: Secondary | ICD-10-CM | POA: Diagnosis not present

## 2023-12-19 DIAGNOSIS — J310 Chronic rhinitis: Secondary | ICD-10-CM | POA: Diagnosis not present

## 2023-12-25 DIAGNOSIS — M25551 Pain in right hip: Secondary | ICD-10-CM | POA: Diagnosis not present

## 2023-12-26 ENCOUNTER — Encounter: Payer: Self-pay | Admitting: Student in an Organized Health Care Education/Training Program

## 2023-12-26 ENCOUNTER — Ambulatory Visit: Payer: 59 | Admitting: Student in an Organized Health Care Education/Training Program

## 2023-12-26 VITALS — BP 110/68 | HR 101 | Temp 97.7°F | Ht 63.0 in | Wt 207.4 lb

## 2023-12-26 DIAGNOSIS — R0602 Shortness of breath: Secondary | ICD-10-CM | POA: Diagnosis not present

## 2023-12-26 DIAGNOSIS — J4540 Moderate persistent asthma, uncomplicated: Secondary | ICD-10-CM

## 2023-12-26 MED ORDER — FLUTICASONE FUROATE-VILANTEROL 200-25 MCG/ACT IN AEPB
1.0000 | INHALATION_SPRAY | Freq: Every day | RESPIRATORY_TRACT | 11 refills | Status: DC
Start: 2023-12-26 — End: 2024-01-02
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
## 2024-01-02 ENCOUNTER — Ambulatory Visit
Admission: RE | Admit: 2024-01-02 | Discharge: 2024-01-02 | Disposition: A | Discharge status: Home / Self Care | Payer: 59 | Source: Ambulatory Visit | Attending: Internal Medicine | Admitting: Internal Medicine

## 2024-01-02 ENCOUNTER — Telehealth: Payer: Self-pay | Admitting: Student in an Organized Health Care Education/Training Program

## 2024-01-02 DIAGNOSIS — J4540 Moderate persistent asthma, uncomplicated: Secondary | ICD-10-CM | POA: Diagnosis not present

## 2024-01-02 DIAGNOSIS — R0602 Shortness of breath: Secondary | ICD-10-CM | POA: Insufficient documentation

## 2024-01-02 DIAGNOSIS — K224 Dyskinesia of esophagus: Secondary | ICD-10-CM | POA: Diagnosis not present

## 2024-01-02 DIAGNOSIS — R918 Other nonspecific abnormal finding of lung field: Secondary | ICD-10-CM | POA: Diagnosis not present

## 2024-01-02 DIAGNOSIS — J984 Other disorders of lung: Secondary | ICD-10-CM | POA: Diagnosis not present

## 2024-01-02 MED ORDER — BREO ELLIPTA 200-25 MCG/ACT IN AEPB
1.0000 | INHALATION_SPRAY | Freq: Every day | RESPIRATORY_TRACT | 11 refills | Status: DC
Start: 2024-01-02 — End: 2024-11-03

## 2024-01-02 NOTE — Telephone Encounter (Signed)
New prescription sent for brand name Breo. Patient advised. Nothing further needed.

## 2024-01-02 NOTE — Telephone Encounter (Signed)
Pt needs Breo 200 rx sent to [facility] on [address] [place] to not allow generic per her insurance

## 2024-01-04 DIAGNOSIS — M25551 Pain in right hip: Secondary | ICD-10-CM | POA: Diagnosis not present

## 2024-01-09 ENCOUNTER — Ambulatory Visit: Payer: Medicare HMO

## 2024-01-09 ENCOUNTER — Encounter: Payer: Self-pay | Admitting: Obstetrics and Gynecology

## 2024-01-09 ENCOUNTER — Ambulatory Visit (INDEPENDENT_AMBULATORY_CARE_PROVIDER_SITE_OTHER): Payer: 59 | Admitting: Obstetrics and Gynecology

## 2024-01-09 VITALS — BP 107/80 | HR 102 | Ht 63.0 in | Wt 199.5 lb

## 2024-01-09 DIAGNOSIS — Z3042 Encounter for surveillance of injectable contraceptive: Secondary | ICD-10-CM

## 2024-01-09 DIAGNOSIS — E66812 Obesity, class 2: Secondary | ICD-10-CM

## 2024-01-09 DIAGNOSIS — Z1231 Encounter for screening mammogram for malignant neoplasm of breast: Secondary | ICD-10-CM

## 2024-01-09 DIAGNOSIS — I10 Essential (primary) hypertension: Secondary | ICD-10-CM

## 2024-01-09 DIAGNOSIS — Z01419 Encounter for gynecological examination (general) (routine) without abnormal findings: Secondary | ICD-10-CM | POA: Diagnosis not present

## 2024-01-09 MED ORDER — MEDROXYPROGESTERONE ACETATE 150 MG/ML IM SUSP
150.0000 mg | Freq: Once | INTRAMUSCULAR | Status: AC
  Administered 2024-01-09: 150 mg via INTRAMUSCULAR
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
## 2024-01-11 DIAGNOSIS — M25551 Pain in right hip: Secondary | ICD-10-CM | POA: Diagnosis not present

## 2024-01-15 DIAGNOSIS — M25551 Pain in right hip: Secondary | ICD-10-CM | POA: Diagnosis not present

## 2024-01-20 ENCOUNTER — Other Ambulatory Visit: Payer: Self-pay | Admitting: Internal Medicine

## 2024-01-22 ENCOUNTER — Other Ambulatory Visit: Payer: Self-pay | Admitting: Internal Medicine

## 2024-01-22 DIAGNOSIS — M25551 Pain in right hip: Secondary | ICD-10-CM | POA: Diagnosis not present

## 2024-01-28 ENCOUNTER — Ambulatory Visit
Admission: RE | Admit: 2024-01-28 | Discharge: 2024-01-28 | Disposition: A | Discharge status: Home / Self Care | Payer: 59 | Source: Ambulatory Visit | Attending: Student in an Organized Health Care Education/Training Program | Admitting: Student in an Organized Health Care Education/Training Program

## 2024-01-28 DIAGNOSIS — I10 Essential (primary) hypertension: Secondary | ICD-10-CM | POA: Diagnosis not present

## 2024-01-28 DIAGNOSIS — R0602 Shortness of breath: Secondary | ICD-10-CM | POA: Diagnosis not present

## 2024-01-28 DIAGNOSIS — J4540 Moderate persistent asthma, uncomplicated: Secondary | ICD-10-CM | POA: Diagnosis not present

## 2024-01-28 LAB — ECHOCARDIOGRAM COMPLETE: S' Lateral: 3.1 cm

## 2024-01-28 NOTE — Progress Notes (Signed)
*  PRELIMINARY RESULTS* Echocardiogram 2D Echocardiogram has been performed.  Margaret Zuniga 01/28/2024, 11:34 AM

## 2024-02-05 ENCOUNTER — Encounter: Payer: Self-pay | Admitting: Emergency Medicine

## 2024-02-05 ENCOUNTER — Ambulatory Visit
Admission: RE | Admit: 2024-02-05 | Discharge: 2024-02-05 | Disposition: A | Discharge status: Home / Self Care | Payer: 59 | Source: Ambulatory Visit | Attending: Obstetrics and Gynecology | Admitting: Obstetrics and Gynecology

## 2024-02-05 ENCOUNTER — Ambulatory Visit
Admission: EM | Admit: 2024-02-05 | Discharge: 2024-02-05 | Disposition: A | Discharge status: Home / Self Care | Payer: 59 | Attending: Emergency Medicine | Admitting: Emergency Medicine

## 2024-02-05 ENCOUNTER — Other Ambulatory Visit: Payer: Self-pay

## 2024-02-05 DIAGNOSIS — Z1231 Encounter for screening mammogram for malignant neoplasm of breast: Secondary | ICD-10-CM | POA: Diagnosis not present

## 2024-02-05 DIAGNOSIS — Z01419 Encounter for gynecological examination (general) (routine) without abnormal findings: Secondary | ICD-10-CM | POA: Insufficient documentation

## 2024-02-05 DIAGNOSIS — J029 Acute pharyngitis, unspecified: Secondary | ICD-10-CM | POA: Diagnosis not present

## 2024-02-05 LAB — POCT RAPID STREP A (OFFICE): Rapid Strep A Screen: NEGATIVE

## 2024-02-05 MED ORDER — AZITHROMYCIN 250 MG PO TABS: 250.0000 mg | ORAL_TABLET | Freq: Every day | ORAL | 0 refills | Status: DC

## 2024-02-05 MED ORDER — PREDNISONE 20 MG PO TABS: 40.0000 mg | ORAL_TABLET | Freq: Every day | ORAL | 0 refills | Status: DC
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
## 2024-02-05 NOTE — Discharge Instructions (Signed)
 Today you are evaluated for your sore throat, on exam there is no redness or swelling however some tenderness present in your nodes which is most likely your body attempting to fix the irritation  Begin prednisone every morning with food for 5 days to help reduce pain, continue use of Chloraseptic drops as well as any Tylenol if needed  Take azithromycin prophylactically to cover for bacteria  May attempt salt water gargles, throat lozenges, warm liquids and soft foods  May follow-up as needed

## 2024-02-05 NOTE — ED Triage Notes (Signed)
 Patient presents to [facility] for evaluation of sore throat for over a week, painful with swallowing, yawning.  No difficulty swallowing.

## 2024-02-07 DIAGNOSIS — G4452 New daily persistent headache (NDPH): Secondary | ICD-10-CM | POA: Diagnosis not present

## 2024-02-09 ENCOUNTER — Encounter: Payer: Self-pay | Admitting: Obstetrics and Gynecology

## 2024-02-11 ENCOUNTER — Encounter: Payer: Self-pay | Admitting: Internal Medicine

## 2024-02-11 ENCOUNTER — Ambulatory Visit (INDEPENDENT_AMBULATORY_CARE_PROVIDER_SITE_OTHER): Payer: 59 | Admitting: Internal Medicine

## 2024-02-11 VITALS — BP 120/78 | HR 105 | Ht 63.0 in | Wt 203.0 lb

## 2024-02-11 DIAGNOSIS — Z87820 Personal history of traumatic brain injury: Secondary | ICD-10-CM | POA: Diagnosis not present

## 2024-02-11 DIAGNOSIS — K219 Gastro-esophageal reflux disease without esophagitis: Secondary | ICD-10-CM | POA: Diagnosis not present

## 2024-02-11 DIAGNOSIS — G44321 Chronic post-traumatic headache, intractable: Secondary | ICD-10-CM | POA: Diagnosis not present

## 2024-02-11 DIAGNOSIS — F325 Major depressive disorder, single episode, in full remission: Secondary | ICD-10-CM | POA: Diagnosis not present

## 2024-02-11 DIAGNOSIS — R29898 Other symptoms and signs involving the musculoskeletal system: Secondary | ICD-10-CM | POA: Diagnosis not present

## 2024-02-11 DIAGNOSIS — R000 Tachycardia, unspecified: Secondary | ICD-10-CM | POA: Diagnosis not present

## 2024-02-11 MED ORDER — POTASSIUM CHLORIDE CRYS ER 10 MEQ PO TBCR: 20.0000 meq | EXTENDED_RELEASE_TABLET | Freq: Every day | ORAL | 0 refills | Status: DC

## 2024-02-11 MED ORDER — HYDROCHLOROTHIAZIDE 25 MG PO TABS: 25.0000 mg | ORAL_TABLET | Freq: Every day | ORAL | 0 refills | Status: DC

## 2024-02-11 MED ORDER — AMLODIPINE BESYLATE 5 MG PO TABS: 5.0000 mg | ORAL_TABLET | Freq: Every day | ORAL | 1 refills | Status: DC

## 2024-02-11 MED ORDER — OMEPRAZOLE 20 MG PO CPDR: 20.0000 mg | DELAYED_RELEASE_CAPSULE | Freq: Two times a day (BID) | ORAL | 3 refills | Status: AC

## 2024-02-11 MED ORDER — METOPROLOL SUCCINATE ER 100 MG PO TB24: 150.0000 mg | ORAL_TABLET | Freq: Every day | ORAL | 1 refills | Status: DC

## 2024-02-11 NOTE — Patient Instructions (Addendum)
 We Will do a Lexapro wean as follows   1/2   tablet daily for 2 weeks,  then 1/2 tablet  every day for one week then stop    If they cannot  be cut in reliable 1/2 tablet quantity:  Taper using 1 tablet every other day for 2 week,  then stop    I have made a Blood pressure  change in medication: ( to lower your pulse some more )   Increase metoprolol to 1.5 tablets  (150 mg )  at night   Reduce amlodipine to 5 mg daily

## 2024-02-11 NOTE — Progress Notes (Unsigned)
 Subjective:  Patient ID: Margaret Zuniga, female    DOB: 18-Apr-1981  Age: 43 y.o. MRN: [id number]  CC: The primary encounter diagnosis was Personal history of traumatic brain injury. Diagnoses of Gastroesophageal reflux disease without esophagitis, Major depressive disorder, single episode, in remission (HCC), Intractable chronic post-traumatic headache, Bilateral leg weakness, and Tachycardia were also pertinent to this visit.   HPI Margaret Zuniga presents for  Chief Complaint  Patient presents with   Medical Management of Chronic Issues    Discuss medications   Margaret Zuniga is a 43 yr old female with chronic headaches ( secondary to remote TBI at age 39) who is currently under management by neurology with protriptyline.  She was asked to follow up with me to consider suspension of chronic use of SSRI medication due to potential interaction between lexapro  and protriptyline.  Her mood is normal and she states that she has no symptoms of untreated depression currently.  She denies any current or recent symptoms of serotonin excess (see below):   "Monitor closely for signs and symptoms of serotonin syndrome/serotonin toxicity (eg, hyperreflexia, clonus, hyperthermia, diaphoresis, tremor, autonomic instability, mental status changes) when these drugs are combined. Consider alternatives in patients with other risk factors (eg, higher drug concentrations/doses, greater numbers of serotonergic agents) who are likely at an even greater risk for these potentially life-threatening toxicities. Additionally, monitor for increased tricyclic antidepressant concentrations and effects."  However she is mildly tachycardic today despite use of 100mg  Toprol XL daily .    Outpatient Medications Prior to Visit  Medication Sig Dispense Refill   Acetaminophen-Caffeine 500-65 MG TABS Take 1 tablet by mouth in the morning, at noon, and at bedtime.      Ascorbic Acid (VITAMIN C) 500 MG CAPS Take 1 tablet by mouth.     baclofen  (LIORESAL) 10 MG tablet Take 10 mg by mouth 2 (two) times daily as needed.     BREO ELLIPTA 200-25 MCG/ACT AEPB Inhale 1 puff into the lungs daily. 30 each 11   Cholecalciferol (VITAMIN D3) 25 MCG (1000 UT) CAPS Take 1 capsule by mouth.     diphenhydrAMINE HCl, Sleep, (ZZZQUIL PO) Take by mouth at bedtime.     escitalopram (LEXAPRO) 10 MG tablet TAKE 1 TABLET BY MOUTH EVERY DAY 90 tablet 0   fluticasone (FLONASE) 50 MCG/ACT nasal spray Place 2 sprays into both nostrils daily. 16 g 6   hydrOXYzine (ATARAX) 25 MG tablet Take 75 mg by mouth 3 (three) times daily as needed.     lidocaine (XYLOCAINE) 2 % solution Use as directed 15 mLs in the mouth or throat every 3 (three) hours as needed for mouth pain (swish and spit). 100 mL 0   MAGNESIUM PO Take 500 mg by mouth.     medroxyPROGESTERone (DEPO-PROVERA) 150 MG/ML injection INJECT 1 ML (150 MG TOTAL) INTO THE MUSCLE EVERY 3 (THREE) MONTHS. 1 mL 3   Multiple Vitamins-Minerals (CENTRUM WOMEN PO) Take by mouth daily in the afternoon.     NURTEC 75 MG TBDP Take by mouth.     protriptyline (VIVACTIL) 10 MG tablet Take 10 mg by mouth in the morning and at bedtime.     senna (SENOKOT) 8.6 MG tablet Take 1 tablet (8.6 mg total) by mouth daily. 30 tablet 0   sodium chloride (BRONCHO SALINE) inhaler solution Take 1 spray by nebulization as needed.     UBRELVY 100 MG TABS TAKE 100MG  AT ONSET OF HEADACHE, REPEAT 2HR LATER IF HEADACHE PERSISTS.  DO NOT EXCEED 200MG  IN 24HR     amLODipine (NORVASC) 10 MG tablet TAKE 1 TABLET BY MOUTH EVERY DAY 90 tablet 0   azithromycin (ZITHROMAX) 250 MG tablet Take 1 tablet (250 mg total) by mouth daily. Take first 2 tablets together, then 1 every day until finished. 6 tablet 0   hydrochlorothiazide (HYDRODIURIL) 25 MG tablet TAKE 1 TABLET (25 MG TOTAL) BY MOUTH DAILY. 90 tablet 0   metoprolol succinate (TOPROL-XL) 100 MG 24 hr tablet TAKE 1 TABLET BY MOUTH EVERY DAY WITH OR IMMEDIATELY FOLLOWING A MEAL 90 tablet 0   omeprazole  (PRILOSEC) 20 MG capsule Take 1 capsule (20 mg total) by mouth 2 (two) times daily before a meal. 180 capsule 3   potassium chloride (KLOR-CON M10) 10 MEQ tablet TAKE 2 TABLETS BY MOUTH EVERY DAY 180 tablet 0   predniSONE (DELTASONE) 20 MG tablet Take 2 tablets (40 mg total) by mouth daily. 10 tablet 0   No facility-administered medications prior to visit.    Review of Systems;  Patient denies headache, fevers, malaise, unintentional weight loss, skin rash, eye pain, sinus congestion and sinus pain, sore throat, dysphagia,  hemoptysis , cough, dyspnea, wheezing, chest pain, palpitations, orthopnea, edema, abdominal pain, nausea, melena, diarrhea, constipation, flank pain, dysuria, hematuria, urinary  Frequency, nocturia, numbness, tingling, seizures,  Focal weakness, Loss of consciousness,  Tremor, insomnia, depression, anxiety, and suicidal ideation.      Objective:  BP 120/78   Pulse (!) 105   Ht 5\' 3"  (1.6 m)   Wt 203 lb (92.1 kg)   SpO2 96%   BMI 35.96 kg/m   BP Readings from Last 3 Encounters:  02/11/24 120/78  02/05/24 124/85  01/09/24 107/80    Wt Readings from Last 3 Encounters:  02/11/24 203 lb (92.1 kg)  01/09/24 199 lb 8 oz (90.5 kg)  12/26/23 207 lb 6.4 oz (94.1 kg)    Physical Exam Vitals reviewed.  Constitutional:      General: She is not in acute distress.    Appearance: She is obese. She is not ill-appearing, toxic-appearing or diaphoretic.  HENT:     Head: Normocephalic.  Eyes:     General: No scleral icterus.       Right eye: No discharge.        Left eye: No discharge.     Conjunctiva/sclera: Conjunctivae normal.  Cardiovascular:     Rate and Rhythm: Normal rate and regular rhythm.     Heart sounds: Normal heart sounds.  Pulmonary:     Effort: Pulmonary effort is normal. No respiratory distress.     Breath sounds: Normal breath sounds.  Musculoskeletal:        General: Normal range of motion.  Skin:    General: Skin is warm and dry.   Neurological:     General: No focal deficit present.     Mental Status: She is alert and oriented to person, place, and time. Mental status is at baseline.  Psychiatric:        Mood and Affect: Mood normal.        Behavior: Behavior normal.        Thought Content: Thought content normal.        Judgment: Judgment normal.     Lab Results  Component Value Date   HGBA1C 5.3 07/10/2023   HGBA1C 5.2 01/02/2023   HGBA1C 5.3 11/17/2021    Lab Results  Component Value Date   CREATININE 0.78 07/10/2023   CREATININE 0.73  01/02/2023   CREATININE 0.62 10/17/2022    Lab Results  Component Value Date   WBC 6.5 07/10/2023   HGB 13.9 07/10/2023   HCT 41.8 07/10/2023   PLT 368.0 07/10/2023   GLUCOSE 87 07/10/2023   CHOL 196 01/02/2023   TRIG 228 (H) 01/02/2023   HDL 51 01/02/2023   LDLDIRECT 109.0 04/17/2016   LDLCALC 106 (H) 01/02/2023   ALT 24 07/10/2023   AST 27 07/10/2023   NA 139 07/10/2023   K 3.3 (L) 07/10/2023   CL 102 07/10/2023   CREATININE 0.78 07/10/2023   BUN 16 07/10/2023   CO2 26 07/10/2023   TSH 2.010 01/02/2023   HGBA1C 5.3 07/10/2023   MICROALBUR <0.7 06/22/2021    MM 3D SCREENING MAMMOGRAM BILATERAL BREAST Result Date: 02/07/2024 CLINICAL DATA:  Screening. EXAM: DIGITAL SCREENING BILATERAL MAMMOGRAM WITH TOMOSYNTHESIS AND CAD TECHNIQUE: Bilateral screening digital craniocaudal and mediolateral oblique mammograms were obtained. Bilateral screening digital breast tomosynthesis was performed. The images were evaluated with computer-aided detection. COMPARISON:  Previous exam(s). ACR Breast Density Category c: The breasts are heterogeneously dense, which may obscure small masses. FINDINGS: There are no findings suspicious for malignancy. IMPRESSION: No mammographic evidence of malignancy. A result letter of this screening mammogram will be mailed directly to the patient. RECOMMENDATION: Screening mammogram in one year. (Code:SM-B-01Y) BI-RADS CATEGORY  1: Negative.  Electronically Signed   By: Meda Klinefelter M.D.   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     Gastroesophageal reflux disease without esophagitis -     Omeprazole; Take 1 capsule (20 mg total) by mouth 2 (two) times daily before a meal.  Dispense: 180 capsule; Refill: 3  Major depressive disorder, single episode, in remission [facility]) Assessment & Plan: She is in agreement to wean lexapro to off over the next 2-3 weeks given use of protriptyline for concurrent headache management ( prescribed by neurology).  She will return in 4 weeks for follow up   Intractable chronic post-traumatic headache Assessment & Plan: Improving on current regimen after failing to improve  despite multiple drug therapies as well as nerve blocks dome by Dr Neale Burly in the past. Continue current regimen prescribed by neurology   Bilateral leg weakness Assessment & Plan: She is now wearing a brace on her right knee to protect from hyperextension per othopedics    Tachycardia Assessment & Plan: Controlled with 100 mg toprol XL for years until recently (Jan 2025). Will increase toprol XL  dose to 150 mg daily (at bedtime) and reduce amlodipine to 5 mg to avoid hypotension and reassess once lexapro has been dc'd    Other orders -     amLODIPine Besylate; Take 1 tablet (5 mg total) by mouth daily.  Dispense: 90 tablet; Refill: 1 -     hydroCHLOROthiazide; Take 1 tablet (25 mg total) by mouth daily.  Dispense: 90 tablet; Refill: 0 -     Metoprolol Succinate ER; Take 1.5 tablets (150 mg total) by mouth daily. TAKE 1 TABLET BY MOUTH EVERY DAY WITH OR IMMEDIATELY FOLLOWING A MEAL  Dispense: 135 tablet; Refill: 1 -     Potassium Chloride Crys ER; Take 2 tablets (20 mEq total) by mouth daily.  Dispense: 180 tablet; Refill: 0     Follow-up:  Return in about 6 weeks (around 03/24/2024) for medication changes  to antidepressants.   Sherlene Shams, MD

## 2024-02-12 NOTE — Assessment & Plan Note (Signed)
 She is in agreement to wean lexapro to off over the next 2-3 weeks given use of protriptyline for concurrent headache management ( prescribed by neurology).  She will return in 4 weeks for follow up

## 2024-02-12 NOTE — Assessment & Plan Note (Signed)
 She is now wearing a brace on her right knee to protect from hyperextension per othopedics

## 2024-02-12 NOTE — Assessment & Plan Note (Addendum)
 Controlled with 100 mg toprol XL for years until recently (Jan 2025). Will increase toprol XL  dose to 150 mg daily (at bedtime) and reduce amlodipine to 5 mg to avoid hypotension and reassess once lexapro has been dc'd

## 2024-02-12 NOTE — Assessment & Plan Note (Signed)
 With chronic headaches, dysarthria,  and right hand contractures secondary to brain trauma at the age of 79.

## 2024-02-12 NOTE — Assessment & Plan Note (Signed)
 Improving on current regimen after failing to improve  despite multiple drug therapies as well as nerve blocks dome by Dr Neale Burly in the past. Continue current regimen prescribed by neurology

## 2024-02-14 ENCOUNTER — Encounter: Payer: Self-pay | Admitting: Internal Medicine

## 2024-02-15 MED ORDER — METOPROLOL SUCCINATE ER 50 MG PO TB24
150.0000 mg | ORAL_TABLET | Freq: Every day | ORAL | 2 refills | Status: DC
Start: 2024-02-15 — End: 2024-03-24

## 2024-02-20 ENCOUNTER — Encounter: Payer: Self-pay | Admitting: Internal Medicine

## 2024-02-21 ENCOUNTER — Ambulatory Visit (INDEPENDENT_AMBULATORY_CARE_PROVIDER_SITE_OTHER): Admitting: Family Medicine

## 2024-02-21 ENCOUNTER — Encounter: Payer: Self-pay | Admitting: Family Medicine

## 2024-02-21 VITALS — BP 108/78 | HR 87 | Temp 97.7°F | Resp 20 | Ht 63.0 in | Wt 200.2 lb

## 2024-02-21 DIAGNOSIS — F325 Major depressive disorder, single episode, in full remission: Secondary | ICD-10-CM | POA: Diagnosis not present

## 2024-02-21 NOTE — Patient Instructions (Addendum)
 It was a pleasure meeting you today. Thank you for allowing me to take part in your health care.  Our goals for today as we discussed include:  Continue Lexapro 10 mg daily as this worked well for your anxiety.  If any of the following symptoms occur  Sweating. Restlessness or agitation. Muscle twitching or stiffness. Rapid heart rate. Nausea, vomiting, or diarrhea. Shivering or goose bumps. Confusion. Irregular heartbeat. Seizures. Loss of consciousness. High fever.  Follow up with PCP as scheduled   This is a list of the screening recommended for you and due dates:  Health Maintenance  Topic Date Due   Pneumococcal Vaccination (1 of 2 - PCV) Never done   Colon Cancer Screening  Never done   COVID-19 Vaccine (6 - 2024-25 season) 02/27/2024*   Medicare Annual Wellness Visit  11/20/2024   Mammogram  02/04/2025   DTaP/Tdap/Td vaccine (6 - Td or Tdap) 09/13/2025   Pap with HPV screening  01/03/2028   Flu Shot  Completed   Hepatitis C Screening  Completed   HIV Screening  Completed   HPV Vaccine  Aged Out  *Topic was postponed. The date shown is not the original due date.      If you have any questions or concerns, please do not hesitate to call the office at [phone].  I look forward to our next visit and until then take care and stay safe.  Regards,   Dana Allan, MD   [facility]

## 2024-02-21 NOTE — Progress Notes (Signed)
 SUBJECTIVE:   Chief Complaint  Patient presents with   Anxiety    X couple of weeks   HPI Presents to clinic for acute visit  Discussed the use of AI scribe software for clinical note transcription with the patient, who gave verbal consent to proceed.  History of Present Illness The patient presents with anxiety and medication management.  She experiences new onset anxiety characterized by sudden anger outbursts. Her anxiety was previously well-controlled on Lexapro, but she is currently tapering off due to interactions with her headache medication, protriptyline. She describes her anxiety as 'quick' and notes it is exacerbated by interactions with her brother. No therapy sessions have been attended.  She was previously on Lexapro, taking 10 mg daily, but is now tapering to 10 mg every other day. Her neurologist advised discontinuing Lexapro due to its interaction with protriptyline, which she takes for headache management. She also takes hydroxyzine, 25 mg two pills three times a day, for headaches, but it does not alleviate her anxiety.  She has a long history of headaches since high school. Protriptyline, 10 mg three times a day, in combination with Nurtec, has provided relief. She has tried other medications like Qulipta in the past without significant relief.      PERTINENT PMH / PSH: As above  OBJECTIVE:  BP 108/78   Pulse 87   Temp 97.7 F (36.5 C)   Resp 20   Ht 5\' 3"  (1.6 m)   Wt 200 lb 4 oz (90.8 kg)   SpO2 98%   BMI 35.47 kg/m    Physical Exam Vitals reviewed.  Constitutional:      General: She is not in acute distress.    Appearance: Normal appearance. She is obese. She is not ill-appearing, toxic-appearing or diaphoretic.  Eyes:     General:        Right eye: No discharge.        Left eye: No discharge.     Conjunctiva/sclera: Conjunctivae normal.  Cardiovascular:     Rate and Rhythm: Normal rate and regular rhythm.     Heart sounds: Normal heart  sounds.  Pulmonary:     Effort: Pulmonary effort is normal.     Breath sounds: Normal breath sounds.  Abdominal:     General: Bowel sounds are normal.  Musculoskeletal:        General: Normal range of motion.  Skin:    General: Skin is warm and dry.  Neurological:     General: No focal deficit present.     Mental Status: She is alert and oriented to person, place, and time. Mental status is at baseline.  Psychiatric:        Mood and Affect: Mood normal.        Behavior: Behavior normal.        Thought Content: Thought content normal.        Judgment: Judgment normal.           02/21/2024    3:00 PM 02/11/2024    4:45 PM 11/28/2023   11:48 AM 11/21/2023    3:24 PM 01/23/2023    3:43 PM  Depression screen PHQ 2/9  Decreased Interest 0 0 0 0 0  Down, Depressed, Hopeless 0 1 0 1 1  PHQ - 2 Score 0 1 0 1 1  Altered sleeping 0  0 2 0  Tired, decreased energy 0  0 0 0  Change in appetite 0  0 0 0  Feeling bad  or failure about yourself  0  0 0 0  Trouble concentrating 0  0 0 0  Moving slowly or fidgety/restless 0  0 0 0  Suicidal thoughts 0  0 0 0  PHQ-9 Score 0  0 3 1  Difficult doing work/chores Not difficult at all  Not difficult at all Not difficult at all Not difficult at all      02/21/2024    3:00 PM 11/28/2023   11:48 AM 01/23/2023    3:44 PM  GAD 7 : Generalized Anxiety Score  Nervous, Anxious, on Edge 2 0 0  Control/stop worrying 1 0 0  Worry too much - different things 0 0 0  Trouble relaxing 0 0 0  Restless 0 0 0  Easily annoyed or irritable 3 0 0  Afraid - awful might happen 0 0 0  Total GAD 7 Score 6 0 0  Anxiety Difficulty Somewhat difficult Not difficult at all Not difficult at all    ASSESSMENT/PLAN:  Major depressive disorder, single episode, in remission [facility]) Assessment & Plan: New anxiety with anger outbursts. Lexapro tapered due to interaction with protriptyline. Hydroxyzine ineffective for anxiety. Previously controlled with daily use of  Lexapro - Continue Lexapro 10 mg daily. - Monitor for signs of serotonin syndrome - Consider Buspar in future       PDMP reviewed  Return in about 1 month (around 03/24/2024) for PCP.  Dana Allan, MD

## 2024-02-26 ENCOUNTER — Encounter: Payer: Self-pay | Admitting: Family Medicine

## 2024-02-26 NOTE — Assessment & Plan Note (Signed)
 New anxiety with anger outbursts. Lexapro tapered due to interaction with protriptyline. Hydroxyzine ineffective for anxiety. Previously controlled with daily use of Lexapro - Continue Lexapro 10 mg daily. - Monitor for signs of serotonin syndrome - Consider Buspar in future

## 2024-03-04 ENCOUNTER — Encounter: Payer: Self-pay | Admitting: Student in an Organized Health Care Education/Training Program

## 2024-03-12 LAB — FECAL OCCULT BLOOD, IMMUNOCHEMICAL: IFOBT: POSITIVE

## 2024-03-20 ENCOUNTER — Telehealth: Payer: Self-pay

## 2024-03-20 DIAGNOSIS — R195 Other fecal abnormalities: Secondary | ICD-10-CM

## 2024-03-20 NOTE — Telephone Encounter (Signed)
 Spoke with [facility] house calls about the abnormal lab result. Pt had an abnormal colorectal FOBT.

## 2024-03-20 NOTE — Telephone Encounter (Signed)
 Copied from CRM [id number]. Topic: General - Other >> Mar 20, 2024 10:59 AM Godfrey Pick wrote: Reason for CRM: Stephanie([employer] housecalls program) [name]) called request to speak with someone on the clinical team regarding abnormal lab results for patient. Unable to reach nurse. Judeth Cornfield contact number is [phone]

## 2024-03-21 NOTE — Telephone Encounter (Signed)
 Referral to [facility] for colonscopy made

## 2024-03-21 NOTE — Addendum Note (Signed)
 Addended by: Sherlene Shams on: 03/21/2024 12:25 PM   Modules accepted: Orders

## 2024-03-21 NOTE — Telephone Encounter (Signed)
 Pt is aware and gave a verbal understanding.

## 2024-03-24 ENCOUNTER — Encounter: Payer: Self-pay | Admitting: Internal Medicine

## 2024-03-24 ENCOUNTER — Ambulatory Visit: Admitting: Internal Medicine

## 2024-03-24 VITALS — BP 126/88 | HR 100 | Ht 63.0 in | Wt 200.0 lb

## 2024-03-24 DIAGNOSIS — I10 Essential (primary) hypertension: Secondary | ICD-10-CM

## 2024-03-24 DIAGNOSIS — G44321 Chronic post-traumatic headache, intractable: Secondary | ICD-10-CM

## 2024-03-24 DIAGNOSIS — F325 Major depressive disorder, single episode, in full remission: Secondary | ICD-10-CM

## 2024-03-24 DIAGNOSIS — R000 Tachycardia, unspecified: Secondary | ICD-10-CM

## 2024-03-24 MED ORDER — METOPROLOL SUCCINATE ER 50 MG PO TB24: 150.0000 mg | ORAL_TABLET | Freq: Every day | ORAL | 2 refills | Status: DC

## 2024-03-24 MED ORDER — BUSPIRONE HCL 5 MG PO TABS: 5.0000 mg | ORAL_TABLET | Freq: Three times a day (TID) | ORAL | 1 refills | Status: DC

## 2024-03-24 MED ORDER — ESCITALOPRAM OXALATE 10 MG PO TABS: 10.0000 mg | ORAL_TABLET | Freq: Every day | ORAL | 1 refills | Status: DC

## 2024-03-24 MED ORDER — HYDROCHLOROTHIAZIDE 25 MG PO TABS: 25.0000 mg | ORAL_TABLET | Freq: Every day | ORAL | 1 refills | Status: DC

## 2024-03-24 NOTE — Patient Instructions (Addendum)
 Continue 10 mg lexapro  daily .  I am adding buspirone  to use 2 to 3 times daily.  This medication has a calming effect and can be increased gradually .  I would like to see you again in 4 weeks    Because you are taking lexapro, please continue to  monitor for signs and symptoms of serotonin syndrome/serotonin toxicity (eg, increased reflexes  elevated body temperature.  Sweating profusely , tremor, dramatic changes in blood pressure,  mental status changes) when this drug is combined with protriptyline.   Consider alternatives in patients with other risk factors (eg, higher drug concentrations/doses, greater numbers of serotonergic agents) who are likely at an even greater risk for these potentially life-threatening toxicities. Additionally, monitor for increased tricyclic antidepressant concentrations and effects."

## 2024-03-24 NOTE — Assessment & Plan Note (Signed)
 Controlled with 150 mg toprol XL

## 2024-03-24 NOTE — Assessment & Plan Note (Signed)
 Symptoms of irritability and anger outbursts have not resolved despite resuming lexapro at 10 mg daily.  (Wean was attempted due to concurrent use of protriptyline).  Aggravating factor is her brother's flagrant alcohol abuse and her father's enabling behavior (the alcoholic brother  does not leave the  house,  has no job, and father buys him the alcohol).  Counselling given to patient today

## 2024-03-24 NOTE — Assessment & Plan Note (Signed)
 BP improved on metoprolol xl 150 mg and amlodipine 5 mg  daily .AND  hctz 25 mg daily

## 2024-03-24 NOTE — Assessment & Plan Note (Signed)
 Improving on current regimen after failing to improve  despite multiple drug therapies as well as nerve blocks dome by Dr Neale Burly in the past. Continue current regimen prescribed by neurology

## 2024-03-24 NOTE — Progress Notes (Signed)
 Subjective:  Patient ID: Margaret Zuniga, female    DOB: September 29, 1981  Age: 43 y.o. MRN: [id number]  CC: The primary encounter diagnosis was Major depressive disorder, single episode, in remission (HCC). Diagnoses of Tachycardia, Intractable chronic post-traumatic headache, and Essential hypertension, benign were also pertinent to this visit.   HPI Margaret Zuniga presents for  Chief Complaint  Patient presents with   Medical Management of Chronic Issues    6 week follow up    Kindsey was seen by Dr Clent Ridges on March 13 for increased agitation and anger secondary to Lexapro taper.  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  Patient was taking 10 mg every other day  and was in the middle of her  second week at this dose when she was evaluated.    She has had no signs and symptoms of serotonin syndrome/serotonin toxicity (eg, hyperreflexia, clonus, hyperthermia, diaphoresis, tremor, autonomic instability, mental status changes) .  We discussed a trial of an alternative given her  risk factors (eg, higher drug concentrations/doses, greater numbers of serotonergic agents) who are likely at an even greater risk for these potentially life-threatening toxicities. Additionally, monitor for increased tricyclic antidepressant concentrations and effects."   She states that the symptoms of irritability,  becoming angry quickly,  sudden outbursts  of anger.  SHE admits that her family's home situation is stressful. Her brother is unemployed and a practicing alcoholic.  Her father buys him the alcohol.  Her mother is very passive and she usually directs her anger at her mother .  She is engaged to be married and looking forward to getting married so she can leave the home situation   2)   Hypertension: patient checks blood pressure twice weekly at home.  Readings have been  for the most part <130/80 at rest . Patient is following a reduced salt diet most days and is taking medications as prescribed    Outpatient Medications Prior to Visit  Medication Sig Dispense Refill   Acetaminophen-Caffeine 500-65 MG TABS Take 1 tablet by mouth in the morning, at noon, and at bedtime.      amLODipine (NORVASC) 5 MG tablet Take 1 tablet (5 mg total) by mouth daily. 90 tablet 1   Ascorbic Acid (VITAMIN C) 500 MG CAPS Take 1 tablet by mouth.     BREO ELLIPTA 200-25 MCG/ACT AEPB Inhale 1 puff into the lungs daily. 30 each 11   Cholecalciferol (VITAMIN D3) 25 MCG (1000 UT) CAPS Take 1 capsule by mouth.     diphenhydrAMINE HCl, Sleep, (ZZZQUIL PO) Take by mouth at bedtime.     fluticasone (FLONASE) 50 MCG/ACT nasal spray Place 2 sprays into both nostrils daily. 16 g 6   hydrOXYzine (ATARAX) 25 MG tablet Take 75 mg by mouth 3 (three) times daily as needed.     lidocaine (XYLOCAINE) 2 % solution Use as directed 15 mLs in the mouth or throat every 3 (three) hours as needed for mouth pain (swish and spit). 100 mL 0   MAGNESIUM PO Take 500 mg by mouth.     medroxyPROGESTERone (DEPO-PROVERA) 150 MG/ML injection INJECT 1 ML (150 MG TOTAL) INTO THE MUSCLE EVERY 3 (THREE) MONTHS. 1 mL 3   Multiple Vitamins-Minerals (CENTRUM WOMEN PO) Take by mouth daily in the afternoon.     NURTEC 75 MG TBDP Take by mouth.  omeprazole (PRILOSEC) 20 MG capsule Take 1 capsule (20 mg total) by mouth 2 (two) times daily before a meal. 180 capsule 3   potassium chloride (KLOR-CON M10) 10 MEQ tablet Take 2 tablets (20 mEq total) by mouth daily. 180 tablet 0   protriptyline (VIVACTIL) 10 MG tablet Take 10 mg by mouth in the morning and at bedtime.     senna (SENOKOT) 8.6 MG tablet Take 1 tablet (8.6 mg total) by mouth daily. 30 tablet 0   sodium chloride (BRONCHO SALINE) inhaler solution Take 1 spray by nebulization as needed.     escitalopram (LEXAPRO) 10 MG tablet TAKE 1 TABLET BY MOUTH EVERY DAY 90 tablet 0    hydrochlorothiazide (HYDRODIURIL) 25 MG tablet Take 1 tablet (25 mg total) by mouth daily. 90 tablet 0   metoprolol succinate (TOPROL-XL) 50 MG 24 hr tablet Take 3 tablets (150 mg total) by mouth daily. TAKE 1 TABLET BY MOUTH EVERY DAY WITH OR IMMEDIATELY FOLLOWING A MEAL 90 tablet 2   No facility-administered medications prior to visit.    Review of Systems;  Patient denies headache, fevers, malaise, unintentional weight loss, skin rash, eye pain, sinus congestion and sinus pain, sore throat, dysphagia,  hemoptysis , cough, dyspnea, wheezing, chest pain, palpitations, orthopnea, edema, abdominal pain, nausea, melena, diarrhea, constipation, flank pain, dysuria, hematuria, urinary  Frequency, nocturia, numbness, tingling, seizures,  Focal weakness, Loss of consciousness,  Tremor, insomnia, depression, anxiety, and suicidal ideation.      Objective:  BP 126/88   Pulse 100   Ht 5\' 3"  (1.6 m)   Wt 200 lb (90.7 kg)   SpO2 97%   BMI 35.43 kg/m   BP Readings from Last 3 Encounters:  03/24/24 126/88  02/21/24 108/78  02/11/24 120/78    Wt Readings from Last 3 Encounters:  03/24/24 200 lb (90.7 kg)  02/21/24 200 lb 4 oz (90.8 kg)  02/11/24 203 lb (92.1 kg)    Physical Exam Vitals reviewed.  Constitutional:      General: She is not in acute distress.    Appearance: She is obese. She is not ill-appearing, toxic-appearing or diaphoretic.     Comments: Mild facial paralysis  HENT:     Head: Normocephalic.  Eyes:     General: No scleral icterus.       Right eye: No discharge.        Left eye: No discharge.     Conjunctiva/sclera: Conjunctivae normal.  Cardiovascular:     Rate and Rhythm: Normal rate and regular rhythm.     Heart sounds: Normal heart sounds.  Pulmonary:     Effort: Pulmonary effort is normal. No respiratory distress.     Breath sounds: Normal breath sounds.  Musculoskeletal:        General: Normal range of motion.  Skin:    General: Skin is warm and dry.   Neurological:     General: No focal deficit present.     Mental Status: She is alert and oriented to person, place, and time. Mental status is at baseline.  Psychiatric:        Mood and Affect: Mood normal.        Behavior: Behavior normal.        Thought Content: Thought content normal.        Judgment: Judgment normal.    Lab Results  Component Value Date   HGBA1C 5.3 07/10/2023   HGBA1C 5.2 01/02/2023   HGBA1C 5.3 11/17/2021    Lab Results  Component Value Date   CREATININE 0.78 07/10/2023   CREATININE 0.73 01/02/2023   CREATININE 0.62 10/17/2022    Lab Results  Component Value Date   WBC 6.5 07/10/2023   HGB 13.9 07/10/2023   HCT 41.8 07/10/2023   PLT 368.0 07/10/2023   GLUCOSE 87 07/10/2023   CHOL 196 01/02/2023   TRIG 228 (H) 01/02/2023   HDL 51 01/02/2023   LDLDIRECT 109.0 04/17/2016   LDLCALC 106 (H) 01/02/2023   ALT 24 07/10/2023   AST 27 07/10/2023   NA 139 07/10/2023   K 3.3 (L) 07/10/2023   CL 102 07/10/2023   CREATININE 0.78 07/10/2023   BUN 16 07/10/2023   CO2 26 07/10/2023   TSH 2.010 01/02/2023   HGBA1C 5.3 07/10/2023   MICROALBUR <0.7 06/22/2021    MM 3D SCREENING MAMMOGRAM BILATERAL BREAST Result Date: 02/07/2024 CLINICAL DATA:  Screening. EXAM: DIGITAL SCREENING BILATERAL MAMMOGRAM WITH TOMOSYNTHESIS AND CAD TECHNIQUE: Bilateral screening digital craniocaudal and mediolateral oblique mammograms were obtained. Bilateral screening digital breast tomosynthesis was performed. The images were evaluated with computer-aided detection. COMPARISON:  Previous exam(s). ACR Breast Density Category c: The breasts are heterogeneously dense, which may obscure small masses. FINDINGS: There are no findings suspicious for malignancy. IMPRESSION: No mammographic evidence of malignancy. A result letter of this screening mammogram will be mailed directly to the patient. RECOMMENDATION: Screening mammogram in one year. (Code:SM-B-01Y) BI-RADS CATEGORY  1: Negative.  Electronically Signed   By: Clancy Crimes M.D.   On: 02/07/2024 15:44    Assessment & Plan:  .Major depressive disorder, single episode, in remission [facility]) Assessment & Plan: Symptoms of irritability and anger outbursts have not resolved despite resuming lexapro at 10 mg daily.  (Wean was attempted due to concurrent use of protriptyline).  Aggravating factor is her brother's flagrant alcohol abuse and her father's enabling behavior (the alcoholic brother  does not leave the  house,  has no job, and father buys him the alcohol).  Counselling given to patient today   Tachycardia Assessment & Plan: Controlled with 150 mg toprol XL    Intractable chronic post-traumatic headache Assessment & Plan: Improving on current regimen after failing to improve  despite multiple drug therapies as well as nerve blocks dome by Dr Margie Sheller in the past. Continue current regimen prescribed by neurology   Essential hypertension, benign Assessment & Plan: BP improved on metoprolol xl 150 mg and amlodipine 5 mg  daily .AND  hctz 25 mg daily    Other orders -     Escitalopram Oxalate; Take 1 tablet (10 mg total) by mouth daily.  Dispense: 90 tablet; Refill: 1 -     hydroCHLOROthiazide; Take 1 tablet (25 mg total) by mouth daily.  Dispense: 90 tablet; Refill: 1 -     busPIRone HCl; Take 1 tablet (5 mg total) by mouth 3 (three) times daily.  Dispense: 90 tablet; Refill: 1 -     Metoprolol Succinate ER; Take 3 tablets (150 mg total) by mouth daily. WITH OR IMMEDIATELY FOLLOWING A MEAL  Dispense: 90 tablet; Refill: 2     I spent 34 minutes on the day of this face to face encounter reviewing patient's  most recent visit with cardiology,  nephrology,  and neurology,  prior relevant surgical and non surgical procedures, recent  labs and imaging studies, counseling on weight management,  reviewing the assessment and plan with patient, and post visit ordering and reviewing of  diagnostics and therapeutics with  patient  .   Follow-up:  Return in about 4 weeks (around 04/21/2024) for anxiety .   Thersia Flax, MD

## 2024-03-27 ENCOUNTER — Telehealth: Payer: Self-pay | Admitting: Internal Medicine

## 2024-03-27 DIAGNOSIS — R29898 Other symptoms and signs involving the musculoskeletal system: Secondary | ICD-10-CM

## 2024-03-27 NOTE — Addendum Note (Signed)
 Addended by: Thersia Flax on: 03/27/2024 04:34 PM   Modules accepted: Orders

## 2024-03-27 NOTE — Telephone Encounter (Signed)
 Patient was notified via MyChart to clarify per Dr Madelon Scheuermann.

## 2024-03-27 NOTE — Telephone Encounter (Signed)
 Called and spoke with pt for clarification. Pt does not need an order for a new brace she needs a referral to [facility]. She stated that they made her braces for her and when she called them to see about getting the brace fixed they stated that she would need a new referral. I have pended the referral for your approval.

## 2024-03-27 NOTE — Addendum Note (Signed)
 Addended by: Andres Bantz on: 03/27/2024 04:25 PM   Modules accepted: Orders

## 2024-03-27 NOTE — Telephone Encounter (Signed)
 Copied from CRM [id number]. Topic: Referral - Request for Referral >> Mar 27, 2024 11:24 AM Jenice Mitts wrote: Did the patient discuss referral with their provider in the last year? Yes (If No - schedule appointment) (If Yes - send message)  Appointment offered? Yes  Type of order/referral and detailed reason for visit: she needs a new brace  Preference of office, provider, location: [facility]  If referral order, have you been seen by this specialty before? No (If Yes, this issue or another issue? When? Where?  Can we respond through MyChart? Yes

## 2024-04-01 ENCOUNTER — Ambulatory Visit: Payer: 59

## 2024-04-04 ENCOUNTER — Ambulatory Visit: Payer: 59

## 2024-04-04 VITALS — BP 127/89 | HR 89 | Ht 63.0 in | Wt 202.1 lb

## 2024-04-04 DIAGNOSIS — Z3042 Encounter for surveillance of injectable contraceptive: Secondary | ICD-10-CM | POA: Diagnosis not present

## 2024-04-04 MED ORDER — MEDROXYPROGESTERONE ACETATE 150 MG/ML IM SUSP
150.0000 mg | Freq: Once | INTRAMUSCULAR | Status: AC
Start: 2024-04-04 — End: 2024-04-04
  Administered 2024-04-04: 150 mg via INTRAMUSCULAR

## 2024-04-04 NOTE — Progress Notes (Signed)
    NURSE VISIT NOTE  Subjective:    Patient ID: Margaret Zuniga, female    DOB: [birth year], 43 y.o.   MRN: [id number]  HPI  Patient is a 43 y.o. G0P0000 female who presents for depo provera  injection.   Objective:    BP 127/89   Pulse 89   Ht 5\' 3"  (1.6 m)   Wt 202 lb 1.6 oz (91.7 kg)   BMI 35.80 kg/m   Last Annual: 01/09/24. Last pap: 01/02/23. Last Depo-Provera : 01/09/24. Side Effects if any: n/a. Serum HCG indicated? No . Depo-Provera  150 mg IM given by: Woody Heading, CMA. Site: Left Upper Outer Quandrant    Assessment:   1. Encounter for management and injection of depo-Provera       Plan:   Next appointment due between 06/20/24 and 07/04/24.    Vale Garrison, CMA

## 2024-04-07 ENCOUNTER — Ambulatory Visit (INDEPENDENT_AMBULATORY_CARE_PROVIDER_SITE_OTHER): Admitting: Podiatry

## 2024-04-07 ENCOUNTER — Encounter: Payer: Self-pay | Admitting: Podiatry

## 2024-04-07 VITALS — Ht 63.0 in | Wt 202.0 lb

## 2024-04-07 DIAGNOSIS — M216X2 Other acquired deformities of left foot: Secondary | ICD-10-CM

## 2024-04-09 ENCOUNTER — Encounter: Payer: Self-pay | Admitting: Podiatry

## 2024-04-09 NOTE — Progress Notes (Signed)
  Subjective:  Patient ID: Margaret Zuniga, female    DOB: Mar 05, 1981,  MRN: [id number]  Chief Complaint  Patient presents with   Foot Pain    Tendonitis, Achilles, left patient states she needs new trilock brace..may have to be ordered    43 y.o. female presents with the above complaint. History confirmed with patient.  She states she needs new laces for her [place] .  She is having difficulty tying them  Objective:  Physical Exam: warm, good capillary refill, no trophic changes or ulcerative lesions, normal DP and PT pulses, and normal sensory exam.  Assessment:   1. Acquired equinus deformity of left foot      Plan:  Patient was evaluated and treated and all questions answered.  Inspected the laces and laces could be refreshed order placed.  These are not something that we have in stock today.  Discussed with her that likely any type of long length boot lace should be sufficient, I also wrote her a prescription to be evaluated [facility] if she needs  No follow-ups on file.

## 2024-04-16 ENCOUNTER — Encounter (HOSPITAL_COMMUNITY): Payer: Self-pay

## 2024-04-16 ENCOUNTER — Other Ambulatory Visit: Payer: Self-pay | Admitting: Internal Medicine

## 2024-04-17 ENCOUNTER — Ambulatory Visit (INDEPENDENT_AMBULATORY_CARE_PROVIDER_SITE_OTHER): Payer: 59 | Admitting: Student in an Organized Health Care Education/Training Program

## 2024-04-17 ENCOUNTER — Encounter: Payer: Self-pay | Admitting: Student in an Organized Health Care Education/Training Program

## 2024-04-17 VITALS — BP 100/80 | HR 100 | Temp 98.6°F | Ht 63.0 in | Wt 200.8 lb

## 2024-04-17 DIAGNOSIS — J4540 Moderate persistent asthma, uncomplicated: Secondary | ICD-10-CM | POA: Diagnosis not present

## 2024-04-17 DIAGNOSIS — E66812 Obesity, class 2: Secondary | ICD-10-CM

## 2024-04-17 DIAGNOSIS — R195 Other fecal abnormalities: Secondary | ICD-10-CM

## 2024-04-17 MED ORDER — ALBUTEROL SULFATE HFA 108 (90 BASE) MCG/ACT IN AERS: 2.0000 | INHALATION_SPRAY | RESPIRATORY_TRACT | 6 refills | Status: DC | PRN

## 2024-04-17 NOTE — Patient Instructions (Signed)
 Continue using your breo ellipta  once daily. Wash your mouth after every use. Use albuterol  as needed every 4 hours as needed

## 2024-04-18 DIAGNOSIS — R195 Other fecal abnormalities: Secondary | ICD-10-CM | POA: Diagnosis not present
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
## 2024-04-22 DIAGNOSIS — R195 Other fecal abnormalities: Secondary | ICD-10-CM | POA: Insufficient documentation

## 2024-04-22 NOTE — Telephone Encounter (Signed)
 Pt was seen by GI on 04/18/2024

## 2024-04-22 NOTE — Telephone Encounter (Signed)
 She had an abnormal Fecal immunochemical test  recently  Done during an  RN house calls visit ,  March 12 2024 notification .  Referral for colonoscopy advised .  [facility] vs [facility]

## 2024-04-22 NOTE — Assessment & Plan Note (Signed)
 Done by RN house calls visit March 12 2024 notification .  Referral for colonoscopy advised

## 2024-04-23 ENCOUNTER — Ambulatory Visit (INDEPENDENT_AMBULATORY_CARE_PROVIDER_SITE_OTHER): Admitting: Internal Medicine

## 2024-04-23 ENCOUNTER — Encounter: Payer: Self-pay | Admitting: Internal Medicine

## 2024-04-23 VITALS — BP 124/80 | HR 81 | Temp 97.9°F | Ht 63.0 in | Wt 204.8 lb

## 2024-04-23 DIAGNOSIS — R3911 Hesitancy of micturition: Secondary | ICD-10-CM

## 2024-04-23 DIAGNOSIS — R748 Abnormal levels of other serum enzymes: Secondary | ICD-10-CM

## 2024-04-23 DIAGNOSIS — G44321 Chronic post-traumatic headache, intractable: Secondary | ICD-10-CM | POA: Diagnosis not present

## 2024-04-23 DIAGNOSIS — R34 Anuria and oliguria: Secondary | ICD-10-CM | POA: Diagnosis not present

## 2024-04-23 DIAGNOSIS — E785 Hyperlipidemia, unspecified: Secondary | ICD-10-CM | POA: Diagnosis not present

## 2024-04-23 DIAGNOSIS — R195 Other fecal abnormalities: Secondary | ICD-10-CM | POA: Diagnosis not present

## 2024-04-23 DIAGNOSIS — J4520 Mild intermittent asthma, uncomplicated: Secondary | ICD-10-CM | POA: Diagnosis not present

## 2024-04-23 MED ORDER — BUSPIRONE HCL 5 MG PO TABS: ORAL_TABLET | ORAL | 1 refills | Status: AC

## 2024-04-23 NOTE — Progress Notes (Unsigned)
 Subjective:  Patient ID: Margaret Zuniga, female    DOB: Mar 28, 1981  Age: 43 y.o. MRN: [id number]  CC: There were no encounter diagnoses.   HPI Margaret Zuniga presents for  Chief Complaint  Patient presents with   Medical Management of Chronic Issues   1) HEME POSITIVE  STOOL;  has been referred to GI.  Colonoscopy planned June 23 by [place]  2) decreased urination OCCURRING INTERMITTENTLY SINCE APRIL 20  :  2 voids in the morning,  none during the day despite feeling she has to, then another at night .  HAS BEEN TAKING PROTRIPTYLINE SINCE NOVEMBER AND BUSPIRONE  TID FOR THE PAST MONTH  3) HEADACHES:  TAKING 50 MG HYDROXYZINE 3 TIMES DAILY, PRESCRIBED BY HER NEUROLOGIST    Outpatient Medications Prior to Visit  Medication Sig Dispense Refill   Acetaminophen -Caffeine 500-65 MG TABS Take 1 tablet by mouth in the morning, at noon, and at bedtime.      albuterol  (VENTOLIN  HFA) 108 (90 Base) MCG/ACT inhaler Inhale 2 puffs into the lungs every 4 (four) hours as needed for wheezing or shortness of breath. 8 g 6   amLODipine  (NORVASC ) 5 MG tablet Take 1 tablet (5 mg total) by mouth daily. 90 tablet 1   Ascorbic Acid (VITAMIN C) 500 MG CAPS Take 1 tablet by mouth.     BREO ELLIPTA  200-25 MCG/ACT AEPB Inhale 1 puff into the lungs daily. 30 each 11   busPIRone  (BUSPAR ) 5 MG tablet TAKE 1 TABLET BY MOUTH THREE TIMES A DAY 270 tablet 1   Cholecalciferol (VITAMIN D3) 25 MCG (1000 UT) CAPS Take 1 capsule by mouth.     diphenhydrAMINE  HCl, Sleep, (ZZZQUIL PO) Take by mouth at bedtime.     escitalopram  (LEXAPRO ) 10 MG tablet Take 1 tablet (10 mg total) by mouth daily. 90 tablet 1   fluticasone  (FLONASE ) 50 MCG/ACT nasal spray Place 2 sprays into both nostrils daily. 16 g 6   hydrochlorothiazide  (HYDRODIURIL ) 25 MG tablet Take 1 tablet (25 mg total) by mouth daily. 90 tablet 1   hydrOXYzine (ATARAX) 25 MG tablet Take 75 mg by mouth 3 (three) times daily as needed.     lidocaine  (XYLOCAINE ) 2 % solution Use as  directed 15 mLs in the mouth or throat every 3 (three) hours as needed for mouth pain (swish and spit). 100 mL 0   MAGNESIUM PO Take 500 mg by mouth.     medroxyPROGESTERone  (DEPO-PROVERA ) 150 MG/ML injection INJECT 1 ML (150 MG TOTAL) INTO THE MUSCLE EVERY 3 (THREE) MONTHS. 1 mL 3   metoprolol  succinate (TOPROL -XL) 50 MG 24 hr tablet Take 3 tablets (150 mg total) by mouth daily. WITH OR IMMEDIATELY FOLLOWING A MEAL 90 tablet 2   Multiple Vitamins-Minerals (CENTRUM WOMEN PO) Take by mouth daily in the afternoon.     NURTEC 75 MG TBDP Take by mouth.     omeprazole  (PRILOSEC) 20 MG capsule Take 1 capsule (20 mg total) by mouth 2 (two) times daily before a meal. 180 capsule 3   potassium chloride  (KLOR-CON  M10) 10 MEQ tablet Take 2 tablets (20 mEq total) by mouth daily. 180 tablet 0   protriptyline (VIVACTIL) 10 MG tablet Take 10 mg by mouth in the morning and at bedtime.     senna (SENOKOT) 8.6 MG tablet Take 1 tablet (8.6 mg total) by mouth daily. 30 tablet 0   sodium chloride  (BRONCHO SALINE ) inhaler solution Take 1 spray by nebulization as needed.  No facility-administered medications prior to visit.    Review of Systems;  Patient denies headache, fevers, malaise, unintentional weight loss, skin rash, eye pain, sinus congestion and sinus pain, sore throat, dysphagia,  hemoptysis , cough, dyspnea, wheezing, chest pain, palpitations, orthopnea, edema, abdominal pain, nausea, melena, diarrhea, constipation, flank pain, dysuria, hematuria, urinary  Frequency, nocturia, numbness, tingling, seizures,  Focal weakness, Loss of consciousness,  Tremor, insomnia, depression, anxiety, and suicidal ideation.      Objective:  BP 124/80   Pulse 81   Temp 97.9 F (36.6 C)   Ht 5\' 3"  (1.6 m)   Wt 204 lb 12.8 oz (92.9 kg)   SpO2 98%   BMI 36.28 kg/m   BP Readings from Last 3 Encounters:  04/23/24 124/80  04/17/24 100/80  04/04/24 127/89    Wt Readings from Last 3 Encounters:  04/23/24 204 lb  12.8 oz (92.9 kg)  04/17/24 200 lb 12.8 oz (91.1 kg)  04/07/24 202 lb (91.6 kg)    Physical Exam  Lab Results  Component Value Date   HGBA1C 5.3 07/10/2023   HGBA1C 5.2 01/02/2023   HGBA1C 5.3 11/17/2021    Lab Results  Component Value Date   CREATININE 0.78 07/10/2023   CREATININE 0.73 01/02/2023   CREATININE 0.62 10/17/2022    Lab Results  Component Value Date   WBC 6.5 07/10/2023   HGB 13.9 07/10/2023   HCT 41.8 07/10/2023   PLT 368.0 07/10/2023   GLUCOSE 87 07/10/2023   CHOL 196 01/02/2023   TRIG 228 (H) 01/02/2023   HDL 51 01/02/2023   LDLDIRECT 109.0 04/17/2016   LDLCALC 106 (H) 01/02/2023   ALT 24 07/10/2023   AST 27 07/10/2023   NA 139 07/10/2023   K 3.3 (L) 07/10/2023   CL 102 07/10/2023   CREATININE 0.78 07/10/2023   BUN 16 07/10/2023   CO2 26 07/10/2023   TSH 2.010 01/02/2023   HGBA1C 5.3 07/10/2023   MICROALBUR <0.7 06/22/2021    MM 3D SCREENING MAMMOGRAM BILATERAL BREAST Result Date: 02/07/2024 CLINICAL DATA:  Screening. EXAM: DIGITAL SCREENING BILATERAL MAMMOGRAM WITH TOMOSYNTHESIS AND CAD TECHNIQUE: Bilateral screening digital craniocaudal and mediolateral oblique mammograms were obtained. Bilateral screening digital breast tomosynthesis was performed. The images were evaluated with computer-aided detection. COMPARISON:  Previous exam(s). ACR Breast Density Category c: The breasts are heterogeneously dense, which may obscure small masses. FINDINGS: There are no findings suspicious for malignancy. IMPRESSION: No mammographic evidence of malignancy. A result letter of this screening mammogram will be mailed directly to the patient. RECOMMENDATION: Screening mammogram in one year. (Code:[id number]-B-01Y) BI-RADS CATEGORY  1: Negative. Electronically Signed   By: Clancy Crimes M.D.   On: 02/07/2024 15:44    Assessment & Plan:  .There are no diagnoses linked to this encounter.   I spent 34 minutes on the day of this face to face encounter reviewing  patient's  most recent visit with cardiology,  nephrology,  and neurology,  prior relevant surgical and non surgical procedures, recent  labs and imaging studies, counseling on weight management,  reviewing the assessment and plan with patient, and post visit ordering and reviewing of  diagnostics and therapeutics with patient  .   Follow-up: No follow-ups on file.   Thersia Flax, MD

## 2024-04-23 NOTE — Assessment & Plan Note (Signed)
 Ruling out UTI. If normal, symptoms are likely medication side effect given intermittent nature.  will decrease buspirione which was the most recent sart

## 2024-04-23 NOTE — Patient Instructions (Addendum)
 Urinary retention and urinary hesitance are side effect of several medications you take  1) DECREASE THE BUSPIRONE   TO ONCE DAILY IN THE AFTERNOON .  IF SYMPTOMS PERSIST,  LET ME KNOW    PROTRIPTYLINE   AND DIPENHYDRAMINE CAN ALSO CAUSE URINARY RETENTION   IF YOU STOP THE DIPHENHYDRAMINE    ADD SOME OF THESE NATURAL INGREDIENTS  FOUND IN RELAXIUM TO YOUR CURRENT MELATONIN MEDICATION   Chamomile 25 mg Passionflower extract 75 mg GABA 100 mg Ashwaganda extract 125 mg Magnesium citrate, glycinate, oxide (100 mg)  L tryptophan 500 mg Valerest (proprietary  ingredient ; probably valeria root extract)    INCREASE YOUR WATER INTAKE TO 48 TO 60 OUNCES DAILY

## 2024-04-24 LAB — URINALYSIS, ROUTINE W REFLEX MICROSCOPIC
Bilirubin Urine: NEGATIVE
Hgb urine dipstick: NEGATIVE
Ketones, ur: NEGATIVE
Leukocytes,Ua: NEGATIVE
Nitrite: NEGATIVE
RBC / HPF: NONE SEEN (ref 0–?)
Specific Gravity, Urine: 1.02 (ref 1.000–1.030)
Total Protein, Urine: NEGATIVE
Urine Glucose: NEGATIVE
Urobilinogen, UA: 0.2 (ref 0.0–1.0)
WBC, UA: NONE SEEN (ref 0–?)
pH: 6.5 (ref 5.0–8.0)

## 2024-04-24 LAB — COMPREHENSIVE METABOLIC PANEL WITH GFR
ALT: 25 U/L (ref 0–35)
AST: 25 U/L (ref 0–37)
Albumin: 4.6 g/dL (ref 3.5–5.2)
Alkaline Phosphatase: 82 U/L (ref 39–117)
BUN: 14 mg/dL (ref 6–23)
CO2: 20 meq/L (ref 19–32)
Calcium: 9.7 mg/dL (ref 8.4–10.5)
Chloride: 105 meq/L (ref 96–112)
Creatinine, Ser: 0.89 mg/dL (ref 0.40–1.20)
GFR: 78.34 mL/min (ref >60.00)
Glucose, Bld: 85 mg/dL (ref 70–99)
Potassium: 3.8 meq/L (ref 3.5–5.1)
Sodium: 138 meq/L (ref 135–145)
Total Bilirubin: 0.5 mg/dL (ref 0.2–1.2)
Total Protein: 7.6 g/dL (ref 6.0–8.3)

## 2024-04-24 LAB — CBC WITH DIFFERENTIAL/PLATELET
Basophils Absolute: 0 10*3/uL (ref 0.0–0.1)
Basophils Relative: 0.5 % (ref 0.0–3.0)
Eosinophils Absolute: 0.1 10*3/uL (ref 0.0–0.7)
Eosinophils Relative: 0.9 % (ref 0.0–5.0)
HCT: 45.1 % (ref 36.0–46.0)
Hemoglobin: 14.7 g/dL (ref 12.0–15.0)
Lymphocytes Relative: 25.2 % (ref 12.0–46.0)
Lymphs Abs: 1.5 10*3/uL (ref 0.7–4.0)
MCHC: 32.5 g/dL (ref 30.0–36.0)
MCV: 85.4 fl (ref 78.0–100.0)
Monocytes Absolute: 0.3 10*3/uL (ref 0.1–1.0)
Monocytes Relative: 5.5 % (ref 3.0–12.0)
Neutro Abs: 4 10*3/uL (ref 1.4–7.7)
Neutrophils Relative %: 67.9 % (ref 43.0–77.0)
Platelets: 322 10*3/uL (ref 150.0–400.0)
RBC: 5.27 Mil/uL — ABNORMAL HIGH (ref 3.87–5.11)
RDW: 14.8 % (ref 11.5–15.5)
WBC: 5.9 10*3/uL (ref 4.0–10.5)

## 2024-04-24 LAB — IBC + FERRITIN
Ferritin: 37.5 ng/mL (ref 10.0–291.0)
Iron: 97 ug/dL (ref 42–145)
Saturation Ratios: 23.8 % (ref 20.0–50.0)
TIBC: 407.4 ug/dL (ref 250.0–450.0)
Transferrin: 291 mg/dL (ref 212.0–360.0)

## 2024-04-24 LAB — LIPID PANEL
Cholesterol: 160 mg/dL (ref 0–200)
HDL: 50.3 mg/dL (ref >39.00)
LDL Cholesterol: 84 mg/dL (ref 0–99)
NonHDL: 109.32
Total CHOL/HDL Ratio: 3
Triglycerides: 127 mg/dL (ref 0.0–149.0)
VLDL: 25.4 mg/dL (ref 0.0–40.0)

## 2024-04-24 LAB — LDL CHOLESTEROL, DIRECT: Direct LDL: 105 mg/dL

## 2024-04-24 NOTE — Assessment & Plan Note (Signed)
 Now managed with proptriptyline by neurology

## 2024-04-25 ENCOUNTER — Ambulatory Visit: Payer: Self-pay | Admitting: Internal Medicine

## 2024-04-25 MED ORDER — FLUCONAZOLE 150 MG PO TABS: 150.0000 mg | ORAL_TABLET | Freq: Every day | ORAL | 0 refills | Status: AC

## 2024-04-25 NOTE — Addendum Note (Signed)
 Addended by: Thersia Flax on: 04/25/2024 01:09 PM   Modules accepted: Orders

## 2024-05-15 ENCOUNTER — Encounter: Payer: Self-pay | Admitting: *Deleted

## 2024-06-02 ENCOUNTER — Ambulatory Visit: Admitting: Certified Registered"

## 2024-06-02 ENCOUNTER — Encounter
Admission: RE | Disposition: A | Discharge status: Home / Self Care | Payer: Self-pay | Source: Home / Self Care | Attending: Gastroenterology

## 2024-06-02 ENCOUNTER — Ambulatory Visit
Admission: RE | Admit: 2024-06-02 | Discharge: 2024-06-02 | Disposition: A | Discharge status: Home / Self Care | Attending: Gastroenterology | Admitting: Gastroenterology

## 2024-06-02 DIAGNOSIS — E669 Obesity, unspecified: Secondary | ICD-10-CM | POA: Diagnosis not present

## 2024-06-02 DIAGNOSIS — I10 Essential (primary) hypertension: Secondary | ICD-10-CM | POA: Diagnosis not present

## 2024-06-02 DIAGNOSIS — G8929 Other chronic pain: Secondary | ICD-10-CM | POA: Diagnosis not present

## 2024-06-02 DIAGNOSIS — Z1211 Encounter for screening for malignant neoplasm of colon: Secondary | ICD-10-CM | POA: Diagnosis not present

## 2024-06-02 DIAGNOSIS — Z6835 Body mass index (BMI) 35.0-35.9, adult: Secondary | ICD-10-CM | POA: Diagnosis not present

## 2024-06-02 DIAGNOSIS — R195 Other fecal abnormalities: Secondary | ICD-10-CM | POA: Insufficient documentation

## 2024-06-02 HISTORY — PX: COLONOSCOPY: SHX5424

## 2024-06-02 SURGERY — COLONOSCOPY
Anesthesia: General

## 2024-06-02 MED ORDER — LIDOCAINE HCL (CARDIAC) PF 100 MG/5ML IV SOSY
PREFILLED_SYRINGE | INTRAVENOUS | Status: DC | PRN
  Administered 2024-06-02: 100 mg via INTRAVENOUS

## 2024-06-02 MED ORDER — PROPOFOL 500 MG/50ML IV EMUL
INTRAVENOUS | Status: DC | PRN
Start: 2024-06-02 — End: 2024-06-02
  Administered 2024-06-02 (×2): 50 mg via INTRAVENOUS
  Administered 2024-06-02: 150 ug/kg/min via INTRAVENOUS
  Administered 2024-06-02: 100 mg via INTRAVENOUS
  Administered 2024-06-02: 50 mg via INTRAVENOUS

## 2024-06-02 MED ORDER — SODIUM CHLORIDE 0.9 % IV SOLN
INTRAVENOUS | Status: DC
  Administered 2024-06-02: 1000 mL via INTRAVENOUS
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
## 2024-06-02 NOTE — Op Note (Signed)
 [facility] Patient Name: Margaret Zuniga Procedure Date: 06/02/2024 10:11 AM MRN: [id number] Account #: [id number] Date of Birth: 15-May-1981 Admit Type: Outpatient Age: 43 Room: [facility] ENDO ROOM 3 Gender: Female Note Status: Finalized Instrument Name: Arvis [id number] Procedure:             Colonoscopy Indications:           Heme positive stool Providers:             Ole Schick MD, MD Medicines:             Monitored Anesthesia Care Complications:         No immediate complications. Procedure:             Pre-Anesthesia Assessment:                        - Prior to the procedure, a History and Physical was                         performed, and patient medications and allergies were                         reviewed. The patient is competent. The risks and                         benefits of the procedure and the sedation options and                         risks were discussed with the patient. All questions                         were answered and informed consent was obtained.                         Patient identification and proposed procedure were                         verified by the physician, the nurse, the                         anesthesiologist, the anesthetist and the technician                         in the endoscopy suite. Mental Status Examination:                         alert and oriented. Airway Examination: normal                         oropharyngeal airway and neck mobility. Respiratory                         Examination: clear to auscultation. CV Examination:                         normal. Prophylactic Antibiotics: The patient does not                         require prophylactic antibiotics. Prior  Anticoagulants: The patient has taken no anticoagulant                         or antiplatelet agents. ASA Grade Assessment: III - A                         patient with severe systemic disease. After  reviewing                         the risks and benefits, the patient was deemed in                         satisfactory condition to undergo the procedure. The                         anesthesia plan was to use monitored anesthesia care                         (MAC). Immediately prior to administration of                         medications, the patient was re-assessed for adequacy                         to receive sedatives. The heart rate, respiratory                         rate, oxygen saturations, blood pressure, adequacy of                         pulmonary ventilation, and response to care were                         monitored throughout the procedure. The physical                         status of the patient was re-assessed after the                         procedure.                        After obtaining informed consent, the colonoscope was                         passed under direct vision. Throughout the procedure,                         the patient's blood pressure, pulse, and oxygen                         saturations were monitored continuously. The                         Colonoscope was introduced through the anus and                         advanced to the the cecum, identified by appendiceal  orifice and ileocecal valve. The ileocecal valve,                         appendiceal orifice, and rectum were photographed. Findings:      The perianal and digital rectal examinations were normal.      A large amount of semi-liquid stool was found in the entire colon,       precluding visualization. Impression:            - Stool in the entire examined colon.                        - No specimens collected. Recommendation:        - Discharge patient to home.                        - Resume previous diet.                        - Continue present medications.                        - Repeat colonoscopy in 6 months because the bowel                          preparation was suboptimal.                        - Return to referring physician as previously                         scheduled. Procedure Code(s):     --- Professional ---                        [phone], Colonoscopy, flexible; diagnostic, including                         collection of specimen(s) by brushing or washing, when                         performed (separate procedure) Diagnosis Code(s):     --- Professional ---                        R19.5, Other fecal abnormalities CPT copyright 2022 [employer] Association. All rights reserved. The codes documented in this report are preliminary and upon coder review may  be revised to meet current compliance requirements. Ole Schick MD, MD 06/02/2024 11:09:16 AM Number of Addenda: 0 Note Initiated On: 06/02/2024 10:11 AM Scope Withdrawal Time: 0 hours 8 minutes 54 seconds  Total Procedure Duration: 0 hours 16 minutes 20 seconds  Estimated Blood Loss:  Estimated blood loss: none.      [facility]

## 2024-06-02 NOTE — Transfer of Care (Signed)
 Immediate Anesthesia Transfer of Care Note  Patient: Margaret Zuniga  Procedure(s) Performed: COLONOSCOPY  Patient Location: PACU  Anesthesia Type:General  Level of Consciousness: drowsy  Airway & Oxygen Therapy: Patient Spontanous Breathing  Post-op Assessment: Report given to RN and Post -op Vital signs reviewed and stable  Post vital signs: stable  Last Vitals:  Vitals Value Taken Time  BP 122/73 06/02/24 11:07  Temp    Pulse 87 06/02/24 11:07  Resp 18 06/02/24 11:07  SpO2 98 % 06/02/24 11:07  Vitals shown include unfiled device data.  Last Pain:  Vitals:   06/02/24 1030  TempSrc: Temporal  PainSc: 0-No pain         Complications: No notable events documented.

## 2024-06-02 NOTE — Anesthesia Postprocedure Evaluation (Signed)
 Anesthesia Post Note  Patient: Margaret Zuniga  Procedure(s) Performed: COLONOSCOPY  Patient location during evaluation: Endoscopy Anesthesia Type: General Level of consciousness: awake and alert Pain management: pain level controlled Vital Signs Assessment: post-procedure vital signs reviewed and stable Respiratory status: spontaneous breathing, nonlabored ventilation, respiratory function stable and patient connected to nasal cannula oxygen Cardiovascular status: blood pressure returned to baseline and stable Postop Assessment: no apparent nausea or vomiting Anesthetic complications: no   There were no known notable events for this encounter.   Last Vitals:  Vitals:   06/02/24 1117 06/02/24 1127  BP: 120/77 132/82  Pulse: 89 86  Resp: (!) 21 19  Temp:    SpO2: 100% 100%    Last Pain:  Vitals:   06/02/24 1107  TempSrc: Temporal  PainSc: Asleep                 Rome Ade

## 2024-06-02 NOTE — Interval H&P Note (Signed)
 History and Physical Interval Note:  06/02/2024 10:35 AM  Margaret Zuniga  has presented today for surgery, with the diagnosis of Heme + stool (R19.5).  The various methods of treatment have been discussed with the patient and family. After consideration of risks, benefits and other options for treatment, the patient has consented to  Procedure(s): COLONOSCOPY (N/A) as a surgical intervention.  The patient's history has been reviewed, patient examined, no change in status, stable for surgery.  I have reviewed the patient's chart and labs.  Questions were answered to the patient's satisfaction.     Ole [name]  Ok to proceed with colonoscopy

## 2024-06-03 DIAGNOSIS — G4452 New daily persistent headache (NDPH): Secondary | ICD-10-CM | POA: Diagnosis not present

## 2024-06-25 ENCOUNTER — Ambulatory Visit

## 2024-06-25 VITALS — BP 112/77 | HR 96 | Wt 202.6 lb

## 2024-06-25 DIAGNOSIS — Z3042 Encounter for surveillance of injectable contraceptive: Secondary | ICD-10-CM | POA: Diagnosis not present

## 2024-06-25 MED ORDER — MEDROXYPROGESTERONE ACETATE 150 MG/ML IM SUSP
150.0000 mg | Freq: Once | INTRAMUSCULAR | Status: AC
  Administered 2024-06-25: 150 mg via INTRAMUSCULAR

## 2024-06-25 NOTE — Progress Notes (Signed)
    NURSE VISIT NOTE  Subjective:    Patient ID: Margaret Zuniga, female    DOB: 1981/12/01, 43 y.o.   MRN: [id number]  HPI  Patient is a 43 y.o. G0P0000 female who presents for depo provera  injection.   Objective:    BP 112/77   Pulse 96   Wt 202 lb 9.6 oz (91.9 kg)   BMI 35.89 kg/m   Last Annual: 01/09/24. Last pap: 01/02/23. Last Depo-Provera : 04/04/24. Side Effects if any: none. Serum HCG indicated? No . Depo-Provera  150 mg IM given by: Rollo Maxin, CMA. Site: Right Upper Outer Quandrant  Lab Review  No results found for any visits on 06/25/24.  Assessment:   1. Encounter for management and injection of depo-Provera       Plan:   Next appointment due between 09/10/24 and 09/24/24.    Rollo JINNY Maxin, CMA

## 2024-06-25 NOTE — Patient Instructions (Signed)

## 2024-07-02 ENCOUNTER — Encounter: Payer: Self-pay | Admitting: Gastroenterology

## 2024-07-09 ENCOUNTER — Other Ambulatory Visit: Payer: Self-pay | Admitting: Internal Medicine

## 2024-07-31 ENCOUNTER — Other Ambulatory Visit: Payer: Self-pay | Admitting: Internal Medicine

## 2024-08-07 ENCOUNTER — Other Ambulatory Visit: Payer: Self-pay | Admitting: Internal Medicine

## 2024-08-12 DIAGNOSIS — M21511 Acquired clawhand, right hand: Secondary | ICD-10-CM | POA: Diagnosis not present

## 2024-08-22 ENCOUNTER — Encounter: Payer: Self-pay | Admitting: *Deleted

## 2024-09-17 ENCOUNTER — Ambulatory Visit

## 2024-09-17 VITALS — BP 124/93 | HR 92 | Ht 63.0 in | Wt 201.2 lb

## 2024-09-17 DIAGNOSIS — Z3042 Encounter for surveillance of injectable contraceptive: Secondary | ICD-10-CM

## 2024-09-17 MED ORDER — MEDROXYPROGESTERONE ACETATE 150 MG/ML IM SUSP
150.0000 mg | Freq: Once | INTRAMUSCULAR | Status: AC
  Administered 2024-09-17: 150 mg via INTRAMUSCULAR

## 2024-09-17 NOTE — Progress Notes (Signed)
    NURSE VISIT NOTE  Subjective:    Patient ID: Margaret Zuniga, female    DOB: Apr 23, 1981, 43 y.o.   MRN: [id number]  HPI  Patient is a 43 y.o. G0P0000 female who presents for depo provera  injection.   Objective:    BP (!) 124/93   Pulse 92   Ht 5' 3 (1.6 m)   Wt 201 lb 3.2 oz (91.3 kg)   BMI 35.64 kg/m   Last Annual: 01/09/24. Last pap: 01/02/23. Last Depo-Provera : 06/25/24. Side Effects if any: N/a. Serum HCG indicated? No. Depo-Provera  150 mg IM given by: Waddell Maxim, CMA. Site: Left Upper Outer Quandrant    Assessment:   1. Encounter for Depo-Provera  contraception      Plan:   Next appointment due between 12/03/24 and 12/17/24.    Waddell JONELLE Maxim, CMA

## 2024-09-24 DIAGNOSIS — J310 Chronic rhinitis: Secondary | ICD-10-CM | POA: Diagnosis not present

## 2024-10-01 ENCOUNTER — Other Ambulatory Visit: Payer: Self-pay | Admitting: Internal Medicine

## 2024-10-16 ENCOUNTER — Other Ambulatory Visit: Payer: Self-pay | Admitting: Internal Medicine

## 2024-10-30 ENCOUNTER — Encounter: Payer: Self-pay | Admitting: *Deleted

## 2024-11-03 ENCOUNTER — Ambulatory Visit: Admitting: Student in an Organized Health Care Education/Training Program

## 2024-11-03 ENCOUNTER — Encounter: Payer: Self-pay | Admitting: *Deleted

## 2024-11-03 ENCOUNTER — Encounter: Payer: Self-pay | Admitting: Student in an Organized Health Care Education/Training Program

## 2024-11-03 VITALS — BP 126/82 | HR 96 | Temp 98.1°F | Ht 63.0 in | Wt 204.4 lb

## 2024-11-03 DIAGNOSIS — J4540 Moderate persistent asthma, uncomplicated: Secondary | ICD-10-CM

## 2024-11-03 MED ORDER — ALBUTEROL SULFATE HFA 108 (90 BASE) MCG/ACT IN AERS: 2.0000 | INHALATION_SPRAY | RESPIRATORY_TRACT | 6 refills | Status: AC | PRN

## 2024-11-03 MED ORDER — FLUTICASONE FUROATE-VILANTEROL 100-25 MCG/ACT IN AEPB
1.0000 | INHALATION_SPRAY | Freq: Every day | RESPIRATORY_TRACT | 11 refills | Status: AC
Start: 2024-11-03 — End: 2025-11-03
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
## 2024-11-03 NOTE — Patient Instructions (Signed)
  VISIT SUMMARY: You came in today for a follow-up visit regarding your asthma. You mentioned having a mild cold since last Friday, which has caused some trouble with your breathing and mild wheezing. However, your symptoms are improving, and your breathing is close to your normal level.  YOUR PLAN: -MODERATE PERSISTENT ASTHMA: Asthma is a condition where your airways narrow and swell, producing extra mucus, which can make breathing difficult. Your asthma is well-controlled, but your recent cold caused some mild wheezing and coughing. You are currently using Breo Ellipta  200 mcg/act inhaler, and you should continue using it until it is finished. After that, you will switch to Breo Ellipta  100 mcg/act inhaler. Remember to take one puff in the morning and wash your mouth after use. We will reassess your condition in six months unless your symptoms change.  INSTRUCTIONS: Finish your current Breo Ellipta  200 mcg/act inhaler before switching to the new 100 mcg/act inhaler. Take one puff in the morning and wash your mouth after use. Follow up in six months unless your symptoms change.     Contains text generated by [employer].

## 2024-11-10 ENCOUNTER — Other Ambulatory Visit: Payer: Self-pay

## 2024-11-10 ENCOUNTER — Encounter: Payer: Self-pay | Admitting: Gastroenterology

## 2024-11-10 ENCOUNTER — Ambulatory Visit: Admitting: Certified Registered"

## 2024-11-10 ENCOUNTER — Ambulatory Visit
Admission: RE | Admit: 2024-11-10 | Discharge: 2024-11-10 | Disposition: A | Discharge status: Home / Self Care | Attending: Gastroenterology | Admitting: Gastroenterology

## 2024-11-10 ENCOUNTER — Encounter
Admission: RE | Disposition: A | Discharge status: Home / Self Care | Payer: Self-pay | Source: Home / Self Care | Attending: Gastroenterology

## 2024-11-10 HISTORY — PX: POLYPECTOMY: SHX149

## 2024-11-10 HISTORY — PX: COLONOSCOPY: SHX5424

## 2024-11-10 SURGERY — COLONOSCOPY
Anesthesia: General

## 2024-11-10 MED ORDER — PHENYLEPHRINE 80 MCG/ML (10ML) SYRINGE FOR IV PUSH (FOR BLOOD PRESSURE SUPPORT)
PREFILLED_SYRINGE | INTRAVENOUS | Status: DC | PRN
  Administered 2024-11-10 (×2): 160 ug via INTRAVENOUS

## 2024-11-10 MED ORDER — SODIUM CHLORIDE 0.9 % IV SOLN: INTRAVENOUS | Status: DC

## 2024-11-10 MED ORDER — LIDOCAINE HCL (CARDIAC) PF 100 MG/5ML IV SOSY
PREFILLED_SYRINGE | INTRAVENOUS | Status: DC | PRN
  Administered 2024-11-10: 100 mg via INTRAVENOUS

## 2024-11-10 MED ORDER — LIDOCAINE HCL (PF) 2 % IJ SOLN
INTRAMUSCULAR | Status: AC
  Filled 2024-11-10: qty 40

## 2024-11-10 MED ORDER — PROPOFOL 500 MG/50ML IV EMUL
INTRAVENOUS | Status: DC | PRN
  Administered 2024-11-10: 200 ug/kg/min via INTRAVENOUS
  Administered 2024-11-10: 30 mg via INTRAVENOUS
  Administered 2024-11-10: 20 mg via INTRAVENOUS

## 2024-11-10 MED ORDER — PROPOFOL 1000 MG/100ML IV EMUL
INTRAVENOUS | Status: AC
  Filled 2024-11-10: qty 100

## 2024-11-10 MED ORDER — PROPOFOL 1000 MG/100ML IV EMUL
INTRAVENOUS | Status: AC
  Filled 2024-11-10: qty 200

## 2024-11-10 NOTE — Interval H&P Note (Signed)
 History and Physical Interval Note:  11/10/2024 7:50 AM  Margaret Zuniga  has presented today for surgery, with the diagnosis of Heme + Stool.  The various methods of treatment have been discussed with the patient and family. After consideration of risks, benefits and other options for treatment, the patient has consented to  Procedure(s): COLONOSCOPY (N/A) as a surgical intervention.  The patient's history has been reviewed, patient examined, no change in status, stable for surgery.  I have reviewed the patient's chart and labs.  Questions were answered to the patient's satisfaction.     Margaret Zuniga  Ok to proceed with colonoscopy
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
## 2024-11-10 NOTE — Transfer of Care (Signed)
 Immediate Anesthesia Transfer of Care Note  Patient: Margaret Zuniga  Procedure(s) Performed: COLONOSCOPY POLYPECTOMY, INTESTINE  Patient Location: PACU  Anesthesia Type:General  Level of Consciousness: drowsy and patient cooperative  Airway & Oxygen Therapy: Patient Spontanous Breathing and Patient connected to nasal cannula oxygen  Post-op Assessment: Report given to RN and Post -op Vital signs reviewed and stable  Post vital signs: stable  Last Vitals:  Vitals Value Taken Time  BP    Temp    Pulse 89 11/10/24 08:16  Resp 17 11/10/24 08:16  SpO2 100 % 11/10/24 08:16  Vitals shown include unfiled device data.  Last Pain:  Vitals:   11/10/24 0706  TempSrc: Tympanic  PainSc: 0-No pain         Complications: There were no known notable events for this encounter.

## 2024-11-10 NOTE — Anesthesia Postprocedure Evaluation (Signed)
 Anesthesia Post Note  Patient: Margaret Zuniga  Procedure(s) Performed: COLONOSCOPY POLYPECTOMY, INTESTINE  Patient location during evaluation: Endoscopy Anesthesia Type: General Level of consciousness: awake and alert Pain management: pain level controlled Vital Signs Assessment: post-procedure vital signs reviewed and stable Respiratory status: spontaneous breathing, nonlabored ventilation and respiratory function stable Cardiovascular status: blood pressure returned to baseline and stable Postop Assessment: no apparent nausea or vomiting Anesthetic complications: no   There were no known notable events for this encounter.   Last Vitals:  Vitals:   11/10/24 0827 11/10/24 0837  BP: 108/82 109/83  Pulse:  91  Resp: 18 19  Temp:    SpO2: 100% 100%    Last Pain:  Vitals:   11/10/24 0837  TempSrc:   PainSc: 0-No pain                 Fairy POUR Khalil Szczepanik
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
## 2024-11-10 NOTE — Op Note (Signed)
 [facility] Patient Name: Margaret Zuniga Procedure Date: 11/10/2024 7:48 AM MRN: [id number] Account #: [id number] Date of Birth: Apr 27, 1981 Admit Type: Outpatient Age: 43 Room: [facility] ENDO ROOM 3 Gender: Female Note Status: Finalized Instrument Name: Colon Scope [id number] Procedure:             Colonoscopy Indications:           Evaluation of unexplained GI bleeding presenting with                         fecal occult blood Providers:             Ole Schick MD, MD Referring MD:          Verneita Kettering, MD (Referring MD) Complications:         No immediate complications. Estimated blood loss:                         Minimal. Procedure:             Pre-Anesthesia Assessment:                        - Prior to the procedure, a History and Physical was                         performed, and patient medications and allergies were                         reviewed. The patient is competent. The risks and                         benefits of the procedure and the sedation options and                         risks were discussed with the patient. All questions                         were answered and informed consent was obtained.                         Patient identification and proposed procedure were                         verified by the physician, the nurse, the                         anesthesiologist, the anesthetist and the technician                         in the endoscopy suite. Mental Status Examination:                         alert and oriented. Airway Examination: normal                         oropharyngeal airway and neck mobility. Respiratory                         Examination: clear to auscultation. CV Examination:  normal. Prophylactic Antibiotics: The patient does not                         require prophylactic antibiotics. Prior                         Anticoagulants: The patient has taken no anticoagulant                          or antiplatelet agents. ASA Grade Assessment: III - A                         patient with severe systemic disease. After reviewing                         the risks and benefits, the patient was deemed in                         satisfactory condition to undergo the procedure. The                         anesthesia plan was to use monitored anesthesia care                         (MAC). Immediately prior to administration of                         medications, the patient was re-assessed for adequacy                         to receive sedatives. The heart rate, respiratory                         rate, oxygen saturations, blood pressure, adequacy of                         pulmonary ventilation, and response to care were                         monitored throughout the procedure. The physical                         status of the patient was re-assessed after the                         procedure.                        After obtaining informed consent, the colonoscope was                         passed under direct vision. Throughout the procedure,                         the patient's blood pressure, pulse, and oxygen                         saturations were monitored continuously. The  Colonoscope was introduced through the anus and                         advanced to the the terminal ileum, with                         identification of the appendiceal orifice and IC                         valve. The colonoscopy was somewhat difficult due to a                         redundant colon. Successful completion of the                         procedure was aided by applying abdominal pressure.                         The patient tolerated the procedure well. The quality                         of the bowel preparation was good. The terminal ileum,                         ileocecal valve, appendiceal orifice, and rectum were                          photographed. Findings:      The perianal and digital rectal examinations were normal.      The terminal ileum appeared normal.      A diffuse area of mild melanosis was found in the ascending colon and in       the cecum.      A 4 mm polyp was found in the ascending colon. The polyp was sessile.       The polyp was removed with a cold snare. Resection and retrieval were       complete. Estimated blood loss was minimal.      Internal hemorrhoids were found during retroflexion. The hemorrhoids       were Grade I (internal hemorrhoids that do not prolapse).      The exam was otherwise without abnormality on direct and retroflexion       views. Impression:            - The examined portion of the ileum was normal.                        - Melanosis in the colon.                        - One 4 mm polyp in the ascending colon, removed with                         a cold snare. Resected and retrieved.                        - Internal hemorrhoids.                        - The examination  was otherwise normal on direct and                         retroflexion views. Recommendation:        - Discharge patient to home.                        - Resume previous diet.                        - Continue present medications.                        - Await pathology results.                        - Repeat colonoscopy in 7 years for surveillance.                        - Return to referring physician as previously                         scheduled. Procedure Code(s):     --- Professional ---                        [phone], Colonoscopy, flexible; with removal of                         tumor(s), polyp(s), or other lesion(s) by snare                         technique Diagnosis Code(s):     --- Professional ---                        K64.0, First degree hemorrhoids                        K63.89, Other specified diseases of intestine                        D12.2, Benign neoplasm of ascending colon                         R19.5, Other fecal abnormalities CPT copyright 2022 American Medical Association. All rights reserved. The codes documented in this report are preliminary and upon coder review may  be revised to meet current compliance requirements. Ole Schick MD, MD 11/10/2024 8:18:51 AM Number of Addenda: 0 Note Initiated On: 11/10/2024 7:48 AM Scope Withdrawal Time: 0 hours 8 minutes 59 seconds  Total Procedure Duration: 0 hours 14 minutes 43 seconds  Estimated Blood Loss:  Estimated blood loss was minimal.      [facility]

## 2024-11-11 LAB — SURGICAL PATHOLOGY

## 2024-11-24 ENCOUNTER — Ambulatory Visit: Payer: Medicare HMO

## 2024-11-24 VITALS — BP 120/80 | Ht 63.0 in | Wt 200.0 lb

## 2024-11-24 DIAGNOSIS — Z0000 Encounter for general adult medical examination without abnormal findings: Secondary | ICD-10-CM | POA: Diagnosis not present

## 2024-11-24 DIAGNOSIS — Z1231 Encounter for screening mammogram for malignant neoplasm of breast: Secondary | ICD-10-CM

## 2024-11-24 NOTE — Patient Instructions (Signed)
 Margaret Zuniga,  Thank you for taking the time for your Medicare Wellness Visit. I appreciate your continued commitment to your health goals. Please review the care plan we discussed, and feel free to reach out if I can assist you further.  Please note that Annual Wellness Visits do not include a physical exam. Some assessments may be limited, especially if the visit was conducted virtually. If needed, we may recommend an in-person follow-up with your provider.  Ongoing Care Seeing your primary care provider every 3 to 6 months helps us  monitor your health and provide consistent, personalized care.  Consider updating your vaccines. You have an order for:  []   2D Mammogram  [x]   3D Mammogram  []   Bone Density     Please call for appointment:  [facility]  [address] [place] [address] [phone]     Make sure to wear two-piece clothing.  No lotions, powders, or deodorants the day of the appointment. Make sure to bring picture ID and insurance card.  Bring list of medications you are currently taking including any supplements.    Referrals If a referral was made during today's visit and you haven't received any updates within two weeks, please contact the referred provider directly to check on the status.  Recommended Screenings:  Health Maintenance  Topic Date Due   Pneumococcal Vaccine (1 of 2 - PCV) Never done   Hepatitis B Vaccine (1 of 3 - 19+ 3-dose series) Never done   HPV Vaccine (1 - 3-dose SCDM series) Never done   COVID-19 Vaccine (6 - 2025-26 season) 08/11/2024   Breast Cancer Screening  02/04/2025   DTaP/Tdap/Td vaccine (6 - Td or Tdap) 09/13/2025   Medicare Annual Wellness Visit  11/24/2025   Pap with HPV screening  01/03/2028   Colon Cancer Screening  11/10/2034   Flu Shot  Completed   Hepatitis C Screening  Completed   HIV Screening  Completed   Meningitis B Vaccine  Aged Out       11/24/2024     3:06 PM  Advanced Directives  Does Patient Have a Medical Advance Directive? Yes  Type of Estate Agent of [place];Living will  Does patient want to make changes to medical advance directive? No - Patient declined  Copy of Healthcare Power of Attorney in Chart? No - copy requested    Vision: Annual vision screenings are recommended for early detection of glaucoma, cataracts, and diabetic retinopathy. These exams can also reveal signs of chronic conditions such as diabetes and high blood pressure.  Dental: Annual dental screenings help detect early signs of oral cancer, gum disease, and other conditions linked to overall health, including heart disease and diabetes.  Please see the attached documents for additional preventive care recommendations.
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
## 2024-12-09 ENCOUNTER — Ambulatory Visit

## 2024-12-15 ENCOUNTER — Ambulatory Visit (INDEPENDENT_AMBULATORY_CARE_PROVIDER_SITE_OTHER)

## 2024-12-15 VITALS — BP 115/81 | HR 96 | Ht 63.0 in | Wt 204.7 lb

## 2024-12-15 DIAGNOSIS — Z3042 Encounter for surveillance of injectable contraceptive: Secondary | ICD-10-CM | POA: Diagnosis not present

## 2024-12-15 MED ORDER — MEDROXYPROGESTERONE ACETATE 150 MG/ML IM SUSP
150.0000 mg | Freq: Once | INTRAMUSCULAR | Status: AC
  Administered 2024-12-15: 150 mg via INTRAMUSCULAR

## 2024-12-15 NOTE — Patient Instructions (Signed)

## 2024-12-15 NOTE — Progress Notes (Addendum)
" ° ° °  NURSE VISIT NOTE  Subjective:    Patient ID: Margaret Zuniga, female    DOB: May 09, 1981, 44 y.o.   MRN: [id number]  HPI  Patient is a 44 y.o. [id number] female who presents for depo provera  injection.   Objective:    BP 115/81   Pulse 96   Ht 5' 3 (1.6 m)   Wt 204 lb 11.2 oz (92.9 kg)   BMI 36.26 kg/m   Last Annual: 01/09/24. Last pap: 01/03/24. Last Depo-Provera : 09/17/24. Side Effects if any: none. Serum HCG indicated? No . Depo-Provera  150 mg IM given by: Mathis Getting, CMA. Site: Left Upper Outer [place]  Lab Review  No results found for any visits on 12/15/24.  Assessment:   1. Encounter for management and injection of depo-Provera       Plan:   Next appointment due between 03/02/25 and 03/16/25.    Mathis LITTIE Getting, CMA  "

## 2024-12-31 ENCOUNTER — Other Ambulatory Visit: Payer: Self-pay | Admitting: Internal Medicine

## 2024-12-31 DIAGNOSIS — I10 Essential (primary) hypertension: Secondary | ICD-10-CM

## 2025-01-01 ENCOUNTER — Telehealth: Payer: Self-pay

## 2025-01-01 NOTE — Telephone Encounter (Signed)
 Detailed vm left as well as a mychart sent to pt to cb to schedule a lab appt see phone note

## 2025-01-01 NOTE — Telephone Encounter (Signed)
 Detailed vm left askign pt to CB to get scheduled a lab appoitnemnt per provider request.   Potassium refill was denied as PCP would liek to check potassium lab:   Margaret Verneita CROME, MD to Me     12/31/24  8:58 PM Refill denied .  Needs labs. Labs ordered    E2C2 PLEASE RELAY INFO TO PT AND HAVE PT SCHEDULED A LAB APPT

## 2025-01-02 ENCOUNTER — Other Ambulatory Visit

## 2025-01-02 DIAGNOSIS — I10 Essential (primary) hypertension: Secondary | ICD-10-CM

## 2025-01-02 LAB — COMPREHENSIVE METABOLIC PANEL WITH GFR
ALT: 30 U/L (ref 3–35)
AST: 23 U/L (ref 5–37)
Albumin: 4.5 g/dL (ref 3.5–5.2)
Alkaline Phosphatase: 82 U/L (ref 39–117)
BUN: 18 mg/dL (ref 6–23)
CO2: 26 meq/L (ref 19–32)
Calcium: 9.6 mg/dL (ref 8.4–10.5)
Chloride: 105 meq/L (ref 96–112)
Creatinine, Ser: 0.93 mg/dL (ref 0.40–1.20)
GFR: 73.95 mL/min
Glucose, Bld: 95 mg/dL (ref 70–99)
Potassium: 3.9 meq/L (ref 3.5–5.1)
Sodium: 142 meq/L (ref 135–145)
Total Bilirubin: 0.7 mg/dL (ref 0.2–1.2)
Total Protein: 7 g/dL (ref 6.0–8.3)

## 2025-01-04 ENCOUNTER — Ambulatory Visit: Payer: Self-pay | Admitting: Internal Medicine

## 2025-01-08 ENCOUNTER — Encounter: Payer: Self-pay | Admitting: Internal Medicine

## 2025-01-11 ENCOUNTER — Other Ambulatory Visit: Payer: Self-pay | Admitting: Internal Medicine

## 2025-01-12 ENCOUNTER — Other Ambulatory Visit: Payer: Self-pay | Admitting: Internal Medicine

## 2025-01-12 MED ORDER — POTASSIUM CHLORIDE CRYS ER 10 MEQ PO TBCR: 20.0000 meq | EXTENDED_RELEASE_TABLET | Freq: Every day | ORAL | 0 refills | Status: AC

## 2025-01-14 ENCOUNTER — Ambulatory Visit: Admitting: Licensed Practical Nurse

## 2025-01-14 ENCOUNTER — Encounter: Payer: Self-pay | Admitting: Licensed Practical Nurse

## 2025-01-14 VITALS — BP 119/84 | HR 102 | Ht 63.0 in | Wt 203.2 lb

## 2025-01-14 DIAGNOSIS — Z0000 Encounter for general adult medical examination without abnormal findings: Secondary | ICD-10-CM

## 2025-03-09 ENCOUNTER — Ambulatory Visit

## 2025-04-28 ENCOUNTER — Ambulatory Visit: Admitting: Internal Medicine

## 2025-12-01 ENCOUNTER — Ambulatory Visit
